# Patient Record
Sex: Female | Born: 1935 | Race: White | Hispanic: No | Smoking: Never smoker
Health system: Southern US, Community
[De-identification: ages and names within clinical notes are randomized; demographics above are authoritative.]

## PROBLEM LIST (undated history)

## (undated) DIAGNOSIS — K219 Gastro-esophageal reflux disease without esophagitis: Secondary | ICD-10-CM

## (undated) DIAGNOSIS — I10 Essential (primary) hypertension: Secondary | ICD-10-CM

## (undated) DIAGNOSIS — M359 Systemic involvement of connective tissue, unspecified: Secondary | ICD-10-CM

## (undated) DIAGNOSIS — I2510 Atherosclerotic heart disease of native coronary artery without angina pectoris: Secondary | ICD-10-CM

## (undated) DIAGNOSIS — C189 Malignant neoplasm of colon, unspecified: Secondary | ICD-10-CM

## (undated) DIAGNOSIS — C787 Secondary malignant neoplasm of liver and intrahepatic bile duct: Principal | ICD-10-CM

## (undated) DIAGNOSIS — I779 Disorder of arteries and arterioles, unspecified: Secondary | ICD-10-CM

## (undated) DIAGNOSIS — D649 Anemia, unspecified: Secondary | ICD-10-CM

## (undated) DIAGNOSIS — E785 Hyperlipidemia, unspecified: Secondary | ICD-10-CM

## (undated) DIAGNOSIS — Z95810 Presence of automatic (implantable) cardiac defibrillator: Secondary | ICD-10-CM

## (undated) DIAGNOSIS — K828 Other specified diseases of gallbladder: Secondary | ICD-10-CM

## (undated) DIAGNOSIS — I639 Cerebral infarction, unspecified: Secondary | ICD-10-CM

## (undated) DIAGNOSIS — I509 Heart failure, unspecified: Secondary | ICD-10-CM

## (undated) DIAGNOSIS — K2270 Barrett's esophagus without dysplasia: Secondary | ICD-10-CM

## (undated) DIAGNOSIS — I739 Peripheral vascular disease, unspecified: Secondary | ICD-10-CM

## (undated) DIAGNOSIS — K5792 Diverticulitis of intestine, part unspecified, without perforation or abscess without bleeding: Secondary | ICD-10-CM

## (undated) HISTORY — DX: Malignant neoplasm of colon, unspecified: C18.9

## (undated) HISTORY — PX: CARDIAC DEFIBRILLATOR PLACEMENT: SHX171

## (undated) HISTORY — DX: Heart failure, unspecified: I50.9

## (undated) HISTORY — DX: Hyperlipidemia, unspecified: E78.5

## (undated) HISTORY — DX: Diverticulitis of intestine, part unspecified, without perforation or abscess without bleeding: K57.92

## (undated) HISTORY — DX: Essential (primary) hypertension: I10

## (undated) HISTORY — DX: Other specified diseases of gallbladder: K82.8

## (undated) HISTORY — PX: ARTERIAL BYPASS SURGRY: SHX557

## (undated) HISTORY — DX: Secondary malignant neoplasm of liver and intrahepatic bile duct: C78.7

## (undated) HISTORY — DX: Atherosclerotic heart disease of native coronary artery without angina pectoris: I25.10

## (undated) HISTORY — DX: Peripheral vascular disease, unspecified: I73.9

## (undated) HISTORY — DX: Disorder of arteries and arterioles, unspecified: I77.9

## (undated) HISTORY — DX: Barrett's esophagus without dysplasia: K22.70

---

## 1997-06-06 ENCOUNTER — Encounter: Payer: Self-pay | Admitting: Internal Medicine

## 1997-06-26 ENCOUNTER — Inpatient Hospital Stay (HOSPITAL_COMMUNITY): Admission: AD | Admit: 1997-06-26 | Discharge: 1997-06-28 | Payer: Self-pay | Admitting: Cardiology

## 1997-07-04 ENCOUNTER — Inpatient Hospital Stay (HOSPITAL_COMMUNITY): Admission: EM | Admit: 1997-07-04 | Discharge: 1997-07-08 | Payer: Self-pay | Admitting: Emergency Medicine

## 1998-10-13 ENCOUNTER — Encounter: Payer: Self-pay | Admitting: *Deleted

## 1998-10-13 ENCOUNTER — Emergency Department (HOSPITAL_COMMUNITY): Admission: EM | Admit: 1998-10-13 | Discharge: 1998-10-13 | Payer: Self-pay | Admitting: Emergency Medicine

## 2000-08-07 ENCOUNTER — Inpatient Hospital Stay (HOSPITAL_COMMUNITY): Admission: EM | Admit: 2000-08-07 | Discharge: 2000-08-18 | Payer: Self-pay | Admitting: Emergency Medicine

## 2000-08-07 ENCOUNTER — Encounter: Payer: Self-pay | Admitting: Emergency Medicine

## 2000-08-08 ENCOUNTER — Encounter: Payer: Self-pay | Admitting: Cardiothoracic Surgery

## 2000-08-08 ENCOUNTER — Encounter: Payer: Self-pay | Admitting: *Deleted

## 2000-08-09 ENCOUNTER — Encounter: Payer: Self-pay | Admitting: Cardiothoracic Surgery

## 2000-08-10 ENCOUNTER — Encounter: Payer: Self-pay | Admitting: Cardiothoracic Surgery

## 2000-08-11 ENCOUNTER — Encounter: Payer: Self-pay | Admitting: Cardiothoracic Surgery

## 2000-08-12 ENCOUNTER — Encounter: Payer: Self-pay | Admitting: Cardiothoracic Surgery

## 2000-08-13 ENCOUNTER — Encounter: Payer: Self-pay | Admitting: Thoracic Surgery (Cardiothoracic Vascular Surgery)

## 2000-08-31 ENCOUNTER — Inpatient Hospital Stay (HOSPITAL_COMMUNITY): Admission: EM | Admit: 2000-08-31 | Discharge: 2000-09-04 | Payer: Self-pay | Admitting: Emergency Medicine

## 2000-08-31 ENCOUNTER — Encounter: Payer: Self-pay | Admitting: Cardiology

## 2000-09-20 ENCOUNTER — Encounter: Admission: RE | Admit: 2000-09-20 | Discharge: 2000-12-19 | Payer: Self-pay | Admitting: *Deleted

## 2004-01-13 ENCOUNTER — Ambulatory Visit: Payer: Self-pay

## 2004-04-19 ENCOUNTER — Ambulatory Visit: Payer: Self-pay | Admitting: Cardiology

## 2006-09-20 ENCOUNTER — Ambulatory Visit: Payer: Self-pay | Admitting: Cardiology

## 2006-09-20 LAB — CONVERTED CEMR LAB
Basophils Absolute: 0.1 10*3/uL (ref 0.0–0.1)
Creatinine, Ser: 0.7 mg/dL (ref 0.4–1.2)
HCT: 40.1 % (ref 36.0–46.0)
Hemoglobin: 13.7 g/dL (ref 12.0–15.0)
MCHC: 34.3 g/dL (ref 30.0–36.0)
MCV: 84.9 fL (ref 78.0–100.0)
Monocytes Absolute: 0.8 10*3/uL — ABNORMAL HIGH (ref 0.2–0.7)
Neutrophils Relative %: 67.4 % (ref 43.0–77.0)
Potassium: 3.7 meq/L (ref 3.5–5.1)
RDW: 13.7 % (ref 11.5–14.6)
Sodium: 135 meq/L (ref 135–145)
TSH: 2.46 microintl units/mL (ref 0.35–5.50)

## 2006-10-09 ENCOUNTER — Ambulatory Visit: Payer: Self-pay | Admitting: Cardiology

## 2006-10-17 ENCOUNTER — Ambulatory Visit: Payer: Self-pay | Admitting: Cardiology

## 2006-10-17 LAB — CONVERTED CEMR LAB
ALT: 19 units/L (ref 0–35)
AST: 26 units/L (ref 0–37)
Albumin: 3.7 g/dL (ref 3.5–5.2)
Alkaline Phosphatase: 108 units/L (ref 39–117)
Calcium: 9.5 mg/dL (ref 8.4–10.5)
Chloride: 103 meq/L (ref 96–112)
GFR calc non Af Amer: 75 mL/min
Total CHOL/HDL Ratio: 4.4
VLDL: 45 mg/dL — ABNORMAL HIGH (ref 0–40)

## 2006-10-27 ENCOUNTER — Ambulatory Visit: Payer: Self-pay | Admitting: Internal Medicine

## 2006-10-27 DIAGNOSIS — E785 Hyperlipidemia, unspecified: Secondary | ICD-10-CM

## 2006-10-27 DIAGNOSIS — E1142 Type 2 diabetes mellitus with diabetic polyneuropathy: Secondary | ICD-10-CM

## 2006-10-27 DIAGNOSIS — I10 Essential (primary) hypertension: Secondary | ICD-10-CM

## 2006-10-30 LAB — CONVERTED CEMR LAB
Basophils Absolute: 0.1 10*3/uL (ref 0.0–0.1)
Creatinine,U: 33.2 mg/dL
Hemoglobin: 13.2 g/dL (ref 12.0–15.0)
Lymphocytes Relative: 20.6 % (ref 12.0–46.0)
MCHC: 33.9 g/dL (ref 30.0–36.0)
Monocytes Absolute: 0.9 10*3/uL — ABNORMAL HIGH (ref 0.2–0.7)
Monocytes Relative: 6.2 % (ref 3.0–11.0)
Neutro Abs: 9.8 10*3/uL — ABNORMAL HIGH (ref 1.4–7.7)

## 2006-11-03 ENCOUNTER — Ambulatory Visit: Payer: Self-pay | Admitting: Cardiology

## 2006-11-03 LAB — CONVERTED CEMR LAB
Basophils Absolute: 0.1 10*3/uL (ref 0.0–0.1)
Creatinine, Ser: 1 mg/dL (ref 0.4–1.2)
Eosinophils Relative: 2.1 % (ref 0.0–5.0)
Glucose, Bld: 167 mg/dL — ABNORMAL HIGH (ref 70–99)
HCT: 39 % (ref 36.0–46.0)
Hemoglobin: 13.3 g/dL (ref 12.0–15.0)
INR: 0.9 (ref 0.8–1.0)
MCHC: 34.1 g/dL (ref 30.0–36.0)
MCV: 84.9 fL (ref 78.0–100.0)
Monocytes Absolute: 1 10*3/uL — ABNORMAL HIGH (ref 0.2–0.7)
Neutrophils Relative %: 69.8 % (ref 43.0–77.0)
Potassium: 3.9 meq/L (ref 3.5–5.1)
RDW: 13.6 % (ref 11.5–14.6)
Sodium: 139 meq/L (ref 135–145)
WBC: 14.7 10*3/uL — ABNORMAL HIGH (ref 4.5–10.5)
aPTT: 27.1 s (ref 21.7–29.8)

## 2006-11-07 ENCOUNTER — Ambulatory Visit: Payer: Self-pay | Admitting: Cardiology

## 2006-11-07 ENCOUNTER — Inpatient Hospital Stay (HOSPITAL_COMMUNITY): Admission: RE | Admit: 2006-11-07 | Discharge: 2006-11-10 | Payer: Self-pay | Admitting: Cardiology

## 2006-11-08 ENCOUNTER — Encounter: Payer: Self-pay | Admitting: Cardiology

## 2006-11-13 ENCOUNTER — Encounter: Payer: Self-pay | Admitting: Interventional Radiology

## 2006-11-20 ENCOUNTER — Ambulatory Visit: Payer: Self-pay | Admitting: Cardiology

## 2006-11-20 ENCOUNTER — Inpatient Hospital Stay (HOSPITAL_COMMUNITY): Admission: AD | Admit: 2006-11-20 | Discharge: 2006-11-21 | Payer: Self-pay | Admitting: Interventional Radiology

## 2006-12-11 ENCOUNTER — Encounter: Payer: Self-pay | Admitting: Interventional Radiology

## 2006-12-11 ENCOUNTER — Telehealth (INDEPENDENT_AMBULATORY_CARE_PROVIDER_SITE_OTHER): Payer: Self-pay | Admitting: *Deleted

## 2006-12-13 ENCOUNTER — Ambulatory Visit: Payer: Self-pay | Admitting: Cardiology

## 2006-12-19 ENCOUNTER — Telehealth (INDEPENDENT_AMBULATORY_CARE_PROVIDER_SITE_OTHER): Payer: Self-pay | Admitting: *Deleted

## 2006-12-19 ENCOUNTER — Ambulatory Visit: Payer: Self-pay | Admitting: Cardiology

## 2006-12-19 LAB — CONVERTED CEMR LAB
ALT: 16 units/L (ref 0–35)
Albumin: 3.8 g/dL (ref 3.5–5.2)
Alkaline Phosphatase: 114 units/L (ref 39–117)
BUN: 13 mg/dL (ref 6–23)
CO2: 30 meq/L (ref 19–32)
Calcium: 9.2 mg/dL (ref 8.4–10.5)
GFR calc Af Amer: 79 mL/min
GFR calc non Af Amer: 66 mL/min
LDL Cholesterol: 73 mg/dL (ref 0–99)
Potassium: 3.7 meq/L (ref 3.5–5.1)
Total CHOL/HDL Ratio: 3.8
Triglycerides: 153 mg/dL — ABNORMAL HIGH (ref 0–149)
VLDL: 31 mg/dL (ref 0–40)

## 2006-12-21 ENCOUNTER — Ambulatory Visit: Payer: Self-pay | Admitting: Internal Medicine

## 2007-01-11 ENCOUNTER — Ambulatory Visit: Payer: Self-pay | Admitting: Cardiology

## 2007-01-22 DIAGNOSIS — K828 Other specified diseases of gallbladder: Secondary | ICD-10-CM

## 2007-01-22 DIAGNOSIS — K5792 Diverticulitis of intestine, part unspecified, without perforation or abscess without bleeding: Secondary | ICD-10-CM

## 2007-01-22 HISTORY — DX: Diverticulitis of intestine, part unspecified, without perforation or abscess without bleeding: K57.92

## 2007-01-22 HISTORY — DX: Other specified diseases of gallbladder: K82.8

## 2007-01-30 ENCOUNTER — Ambulatory Visit: Payer: Self-pay | Admitting: Internal Medicine

## 2007-01-30 LAB — CONVERTED CEMR LAB
BUN: 15 mg/dL (ref 6–23)
Basophils Relative: 0.9 % (ref 0.0–1.0)
CO2: 30 meq/L (ref 19–32)
Calcium: 9.5 mg/dL (ref 8.4–10.5)
Creatinine, Ser: 1 mg/dL (ref 0.4–1.2)
Eosinophils Relative: 3.2 % (ref 0.0–5.0)
GFR calc Af Amer: 70 mL/min
Glucose, Bld: 172 mg/dL — ABNORMAL HIGH (ref 70–99)
Hemoglobin: 11.7 g/dL — ABNORMAL LOW (ref 12.0–15.0)
INR: 0.9 (ref 0.8–1.0)
Lymphocytes Relative: 23.2 % (ref 12.0–46.0)
Monocytes Absolute: 0.6 10*3/uL (ref 0.2–0.7)
Monocytes Relative: 5.9 % (ref 3.0–11.0)
Neutro Abs: 7.3 10*3/uL (ref 1.4–7.7)
Potassium: 3.6 meq/L (ref 3.5–5.1)
Prothrombin Time: 11.4 s (ref 10.9–13.3)
RDW: 13.5 % (ref 11.5–14.6)
WBC: 10.8 10*3/uL — ABNORMAL HIGH (ref 4.5–10.5)

## 2007-02-01 ENCOUNTER — Ambulatory Visit: Payer: Self-pay | Admitting: Internal Medicine

## 2007-02-01 ENCOUNTER — Observation Stay (HOSPITAL_COMMUNITY): Admission: RE | Admit: 2007-02-01 | Discharge: 2007-02-02 | Payer: Self-pay | Admitting: Internal Medicine

## 2007-02-12 ENCOUNTER — Ambulatory Visit: Payer: Self-pay | Admitting: Internal Medicine

## 2007-02-12 ENCOUNTER — Telehealth (INDEPENDENT_AMBULATORY_CARE_PROVIDER_SITE_OTHER): Payer: Self-pay | Admitting: *Deleted

## 2007-02-12 DIAGNOSIS — I779 Disorder of arteries and arterioles, unspecified: Secondary | ICD-10-CM | POA: Insufficient documentation

## 2007-02-12 DIAGNOSIS — I739 Peripheral vascular disease, unspecified: Secondary | ICD-10-CM

## 2007-02-20 ENCOUNTER — Telehealth: Payer: Self-pay | Admitting: Internal Medicine

## 2007-02-20 ENCOUNTER — Ambulatory Visit: Payer: Self-pay | Admitting: Internal Medicine

## 2007-02-20 ENCOUNTER — Encounter (INDEPENDENT_AMBULATORY_CARE_PROVIDER_SITE_OTHER): Payer: Self-pay | Admitting: *Deleted

## 2007-02-20 ENCOUNTER — Inpatient Hospital Stay (HOSPITAL_COMMUNITY): Admission: EM | Admit: 2007-02-20 | Discharge: 2007-02-23 | Payer: Self-pay | Admitting: Emergency Medicine

## 2007-02-23 ENCOUNTER — Encounter (INDEPENDENT_AMBULATORY_CARE_PROVIDER_SITE_OTHER): Payer: Self-pay | Admitting: *Deleted

## 2007-03-01 ENCOUNTER — Ambulatory Visit: Payer: Self-pay | Admitting: Cardiology

## 2007-03-01 LAB — CONVERTED CEMR LAB
Basophils Absolute: 0 10*3/uL (ref 0.0–0.1)
Eosinophils Absolute: 0.5 10*3/uL (ref 0.0–0.6)
Eosinophils Relative: 3.5 % (ref 0.0–5.0)
GFR calc Af Amer: 70 mL/min
GFR calc non Af Amer: 58 mL/min
Glucose, Bld: 142 mg/dL — ABNORMAL HIGH (ref 70–99)
HCT: 35.5 % — ABNORMAL LOW (ref 36.0–46.0)
Lymphocytes Relative: 16 % (ref 12.0–46.0)
MCHC: 34.5 g/dL (ref 30.0–36.0)
MCV: 81 fL (ref 78.0–100.0)
Neutro Abs: 11 10*3/uL — ABNORMAL HIGH (ref 1.4–7.7)
Neutrophils Relative %: 75.6 % (ref 43.0–77.0)
Potassium: 3.7 meq/L (ref 3.5–5.1)
Sodium: 137 meq/L (ref 135–145)
WBC: 14.5 10*3/uL — ABNORMAL HIGH (ref 4.5–10.5)

## 2007-03-05 ENCOUNTER — Ambulatory Visit: Payer: Self-pay

## 2007-03-09 ENCOUNTER — Ambulatory Visit: Payer: Self-pay | Admitting: Internal Medicine

## 2007-03-09 DIAGNOSIS — K8020 Calculus of gallbladder without cholecystitis without obstruction: Secondary | ICD-10-CM | POA: Insufficient documentation

## 2007-03-16 ENCOUNTER — Ambulatory Visit (HOSPITAL_COMMUNITY): Admission: RE | Admit: 2007-03-16 | Discharge: 2007-03-16 | Payer: Self-pay | Admitting: Interventional Radiology

## 2007-03-19 ENCOUNTER — Inpatient Hospital Stay (HOSPITAL_COMMUNITY): Admission: EM | Admit: 2007-03-19 | Discharge: 2007-03-24 | Payer: Self-pay | Admitting: Emergency Medicine

## 2007-03-19 ENCOUNTER — Ambulatory Visit: Payer: Self-pay | Admitting: Internal Medicine

## 2007-03-24 ENCOUNTER — Encounter: Payer: Self-pay | Admitting: Internal Medicine

## 2007-04-03 ENCOUNTER — Inpatient Hospital Stay (HOSPITAL_COMMUNITY): Admission: RE | Admit: 2007-04-03 | Discharge: 2007-04-04 | Payer: Self-pay | Admitting: Interventional Radiology

## 2007-04-09 ENCOUNTER — Ambulatory Visit: Payer: Self-pay | Admitting: Cardiology

## 2007-04-09 LAB — CONVERTED CEMR LAB
BUN: 12 mg/dL (ref 6–23)
GFR calc Af Amer: 106 mL/min
GFR calc non Af Amer: 87 mL/min
Potassium: 3.4 meq/L — ABNORMAL LOW (ref 3.5–5.1)
Sodium: 138 meq/L (ref 135–145)

## 2007-04-18 ENCOUNTER — Ambulatory Visit: Payer: Self-pay | Admitting: Cardiology

## 2007-04-18 LAB — CONVERTED CEMR LAB
BUN: 11 mg/dL (ref 6–23)
CO2: 30 meq/L (ref 19–32)
Calcium: 9.3 mg/dL (ref 8.4–10.5)
GFR calc Af Amer: 70 mL/min
GFR calc non Af Amer: 58 mL/min
Potassium: 4.6 meq/L (ref 3.5–5.1)

## 2007-04-19 ENCOUNTER — Encounter: Payer: Self-pay | Admitting: Interventional Radiology

## 2007-05-09 ENCOUNTER — Ambulatory Visit: Payer: Self-pay | Admitting: Cardiology

## 2007-05-09 ENCOUNTER — Ambulatory Visit: Payer: Self-pay | Admitting: Internal Medicine

## 2007-05-09 LAB — CONVERTED CEMR LAB
BUN: 14 mg/dL (ref 6–23)
Calcium: 9.6 mg/dL (ref 8.4–10.5)
GFR calc Af Amer: 79 mL/min
GFR calc non Af Amer: 65 mL/min
Glucose, Bld: 156 mg/dL — ABNORMAL HIGH (ref 70–99)

## 2007-06-07 ENCOUNTER — Ambulatory Visit: Payer: Self-pay | Admitting: Internal Medicine

## 2007-06-07 DIAGNOSIS — R269 Unspecified abnormalities of gait and mobility: Secondary | ICD-10-CM

## 2007-06-12 ENCOUNTER — Ambulatory Visit: Payer: Self-pay | Admitting: Cardiology

## 2007-07-09 ENCOUNTER — Telehealth (INDEPENDENT_AMBULATORY_CARE_PROVIDER_SITE_OTHER): Payer: Self-pay | Admitting: *Deleted

## 2007-07-10 ENCOUNTER — Ambulatory Visit: Payer: Self-pay | Admitting: Internal Medicine

## 2007-07-17 ENCOUNTER — Encounter (INDEPENDENT_AMBULATORY_CARE_PROVIDER_SITE_OTHER): Payer: Self-pay | Admitting: *Deleted

## 2007-07-18 ENCOUNTER — Ambulatory Visit: Payer: Self-pay | Admitting: Internal Medicine

## 2007-07-20 ENCOUNTER — Telehealth (INDEPENDENT_AMBULATORY_CARE_PROVIDER_SITE_OTHER): Payer: Self-pay | Admitting: *Deleted

## 2007-07-20 LAB — CONVERTED CEMR LAB
ALT: 14 units/L (ref 0–35)
AST: 18 units/L (ref 0–37)
Cholesterol: 142 mg/dL (ref 0–200)
Creatinine,U: 108.5 mg/dL
HDL: 29.6 mg/dL — ABNORMAL LOW (ref >39.0)
Hgb A1c MFr Bld: 7 % — ABNORMAL HIGH (ref 4.6–6.0)
Microalb, Ur: 2.7 mg/dL — ABNORMAL HIGH (ref 0.0–1.9)

## 2007-08-02 ENCOUNTER — Ambulatory Visit (HOSPITAL_COMMUNITY): Admission: RE | Admit: 2007-08-02 | Discharge: 2007-08-02 | Payer: Self-pay | Admitting: Interventional Radiology

## 2007-08-03 ENCOUNTER — Encounter: Payer: Self-pay | Admitting: Interventional Radiology

## 2007-08-06 ENCOUNTER — Ambulatory Visit: Payer: Self-pay | Admitting: Internal Medicine

## 2007-11-05 ENCOUNTER — Ambulatory Visit: Payer: Self-pay | Admitting: Internal Medicine

## 2007-11-05 DIAGNOSIS — G4700 Insomnia, unspecified: Secondary | ICD-10-CM

## 2007-11-12 ENCOUNTER — Encounter (INDEPENDENT_AMBULATORY_CARE_PROVIDER_SITE_OTHER): Payer: Self-pay | Admitting: *Deleted

## 2007-11-12 LAB — CONVERTED CEMR LAB
BUN: 20 mg/dL (ref 6–23)
Calcium: 9.2 mg/dL (ref 8.4–10.5)
GFR calc Af Amer: 70 mL/min
Glucose, Bld: 112 mg/dL — ABNORMAL HIGH (ref 70–99)
Sodium: 144 meq/L (ref 135–145)

## 2007-12-27 ENCOUNTER — Ambulatory Visit (HOSPITAL_COMMUNITY): Admission: RE | Admit: 2007-12-27 | Discharge: 2007-12-27 | Payer: Self-pay | Admitting: Interventional Radiology

## 2007-12-31 ENCOUNTER — Ambulatory Visit: Payer: Self-pay | Admitting: Internal Medicine

## 2008-01-24 ENCOUNTER — Ambulatory Visit: Payer: Self-pay | Admitting: Cardiology

## 2008-01-24 ENCOUNTER — Encounter: Payer: Self-pay | Admitting: Internal Medicine

## 2008-01-29 ENCOUNTER — Inpatient Hospital Stay (HOSPITAL_COMMUNITY): Admission: RE | Admit: 2008-01-29 | Discharge: 2008-01-31 | Payer: Self-pay | Admitting: Interventional Radiology

## 2008-01-29 ENCOUNTER — Ambulatory Visit: Payer: Self-pay | Admitting: Internal Medicine

## 2008-02-01 ENCOUNTER — Telehealth (INDEPENDENT_AMBULATORY_CARE_PROVIDER_SITE_OTHER): Payer: Self-pay | Admitting: *Deleted

## 2008-02-12 ENCOUNTER — Encounter: Payer: Self-pay | Admitting: Internal Medicine

## 2008-02-12 ENCOUNTER — Encounter: Payer: Self-pay | Admitting: Interventional Radiology

## 2008-02-18 ENCOUNTER — Telehealth (INDEPENDENT_AMBULATORY_CARE_PROVIDER_SITE_OTHER): Payer: Self-pay | Admitting: *Deleted

## 2008-02-26 ENCOUNTER — Ambulatory Visit: Payer: Self-pay | Admitting: Internal Medicine

## 2008-02-27 ENCOUNTER — Ambulatory Visit: Payer: Self-pay | Admitting: Internal Medicine

## 2008-03-02 ENCOUNTER — Telehealth: Payer: Self-pay | Admitting: Family Medicine

## 2008-04-10 ENCOUNTER — Encounter: Payer: Self-pay | Admitting: Internal Medicine

## 2008-05-13 ENCOUNTER — Telehealth (INDEPENDENT_AMBULATORY_CARE_PROVIDER_SITE_OTHER): Payer: Self-pay | Admitting: *Deleted

## 2008-05-20 ENCOUNTER — Ambulatory Visit: Payer: Self-pay | Admitting: Internal Medicine

## 2008-05-20 LAB — HM DIABETES FOOT EXAM

## 2008-05-26 ENCOUNTER — Ambulatory Visit: Payer: Self-pay | Admitting: Internal Medicine

## 2008-05-27 ENCOUNTER — Ambulatory Visit: Payer: Self-pay | Admitting: Internal Medicine

## 2008-05-29 ENCOUNTER — Telehealth: Payer: Self-pay | Admitting: Internal Medicine

## 2008-06-03 ENCOUNTER — Telehealth (INDEPENDENT_AMBULATORY_CARE_PROVIDER_SITE_OTHER): Payer: Self-pay | Admitting: *Deleted

## 2008-06-03 LAB — CONVERTED CEMR LAB
Basophils Absolute: 0.1 10*3/uL (ref 0.0–0.1)
CO2: 26 meq/L (ref 19–32)
Calcium: 9.2 mg/dL (ref 8.4–10.5)
Creatinine, Ser: 1 mg/dL (ref 0.4–1.2)
Creatinine,U: 38.4 mg/dL
HDL: 31.7 mg/dL — ABNORMAL LOW (ref >39.00)
Hgb A1c MFr Bld: 7.3 % — ABNORMAL HIGH (ref 4.6–6.5)
Lymphocytes Relative: 18.8 % (ref 12.0–46.0)
Microalb Creat Ratio: 20.8 mg/g (ref 0.0–30.0)
Monocytes Relative: 7.2 % (ref 3.0–12.0)
Neutrophils Relative %: 69.7 % (ref 43.0–77.0)
Platelets: 216 10*3/uL (ref 150.0–400.0)
RDW: 14 % (ref 11.5–14.6)
TSH: 2.81 microintl units/mL (ref 0.35–5.50)
Total CHOL/HDL Ratio: 4
VLDL: 30.2 mg/dL (ref 0.0–40.0)

## 2008-06-05 ENCOUNTER — Ambulatory Visit: Payer: Self-pay | Admitting: Internal Medicine

## 2008-06-06 ENCOUNTER — Telehealth: Payer: Self-pay | Admitting: Internal Medicine

## 2008-06-10 ENCOUNTER — Ambulatory Visit: Payer: Self-pay | Admitting: Internal Medicine

## 2008-06-12 ENCOUNTER — Telehealth (INDEPENDENT_AMBULATORY_CARE_PROVIDER_SITE_OTHER): Payer: Self-pay | Admitting: *Deleted

## 2008-06-12 LAB — CONVERTED CEMR LAB
Ferritin: 31.7 ng/mL (ref 10.0–291.0)
Iron: 40 ug/dL — ABNORMAL LOW (ref 42–145)

## 2008-07-18 DIAGNOSIS — Z862 Personal history of diseases of the blood and blood-forming organs and certain disorders involving the immune mechanism: Secondary | ICD-10-CM

## 2008-07-18 DIAGNOSIS — D649 Anemia, unspecified: Secondary | ICD-10-CM | POA: Insufficient documentation

## 2008-07-18 DIAGNOSIS — K2990 Gastroduodenitis, unspecified, without bleeding: Secondary | ICD-10-CM

## 2008-07-18 DIAGNOSIS — Z8639 Personal history of other endocrine, nutritional and metabolic disease: Secondary | ICD-10-CM

## 2008-07-18 DIAGNOSIS — K2970 Gastritis, unspecified, without bleeding: Secondary | ICD-10-CM | POA: Insufficient documentation

## 2008-07-18 DIAGNOSIS — K5730 Diverticulosis of large intestine without perforation or abscess without bleeding: Secondary | ICD-10-CM | POA: Insufficient documentation

## 2008-08-25 ENCOUNTER — Ambulatory Visit: Payer: Self-pay | Admitting: Internal Medicine

## 2008-09-04 ENCOUNTER — Encounter: Payer: Self-pay | Admitting: Internal Medicine

## 2008-10-21 ENCOUNTER — Telehealth (INDEPENDENT_AMBULATORY_CARE_PROVIDER_SITE_OTHER): Payer: Self-pay | Admitting: *Deleted

## 2008-11-23 ENCOUNTER — Encounter: Payer: Self-pay | Admitting: Internal Medicine

## 2008-11-24 ENCOUNTER — Ambulatory Visit: Payer: Self-pay | Admitting: Internal Medicine

## 2008-11-30 ENCOUNTER — Telehealth: Payer: Self-pay | Admitting: Internal Medicine

## 2008-12-05 ENCOUNTER — Encounter: Payer: Self-pay | Admitting: Internal Medicine

## 2008-12-12 ENCOUNTER — Encounter: Payer: Self-pay | Admitting: Internal Medicine

## 2008-12-19 ENCOUNTER — Encounter (INDEPENDENT_AMBULATORY_CARE_PROVIDER_SITE_OTHER): Payer: Self-pay | Admitting: *Deleted

## 2009-01-31 IMAGING — XA IR ANGIO/CAROTID/CERV BI
1 series · 15 of 24 positions shown · non-contrast
Comparison: CT angiogram of 03/23/2007, and catheter angiogram of
03/16/2007.

CLINICAL DATA: Right cerebral hemispheric ischemia secondary to
severe right internal carotid artery stenosis intracranially.
Status post endovascular stenting.

BILATERAL CAROTID ARTERIOGRAPHY, LEFT VERTEBRAL ARTERIOGRAPHY,
RIGHT VERTEBRAL ARTERIOGRAPHY, BILATERAL VERTEBRAL ARTERY
ANGIOGRAMS

[Series 1: run · 15 of 210 slices shown]
[im 1/210]
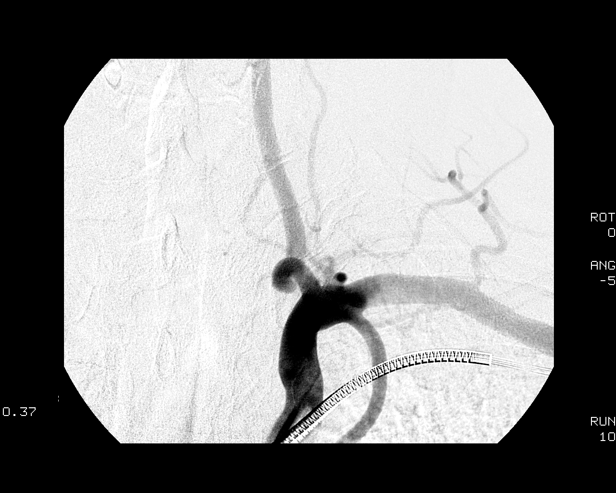
[im 19/210]
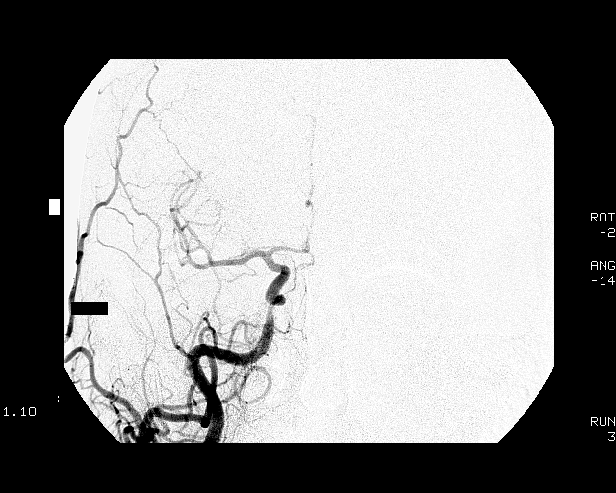
[im 37/210]
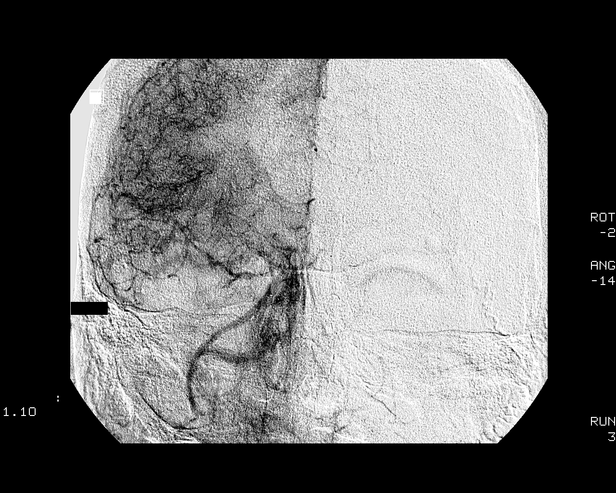
[im 46/210]
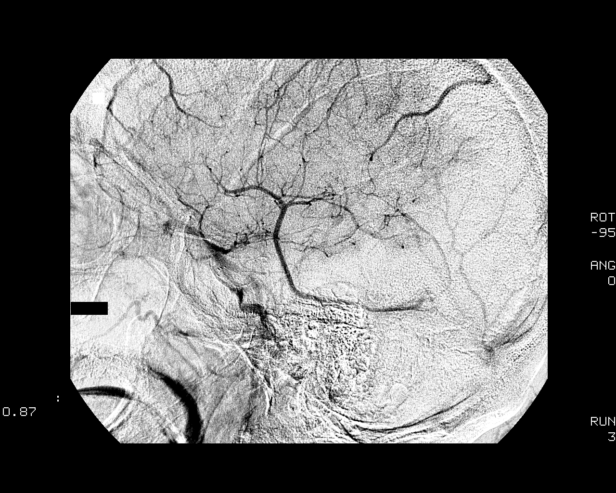
[im 64/210]
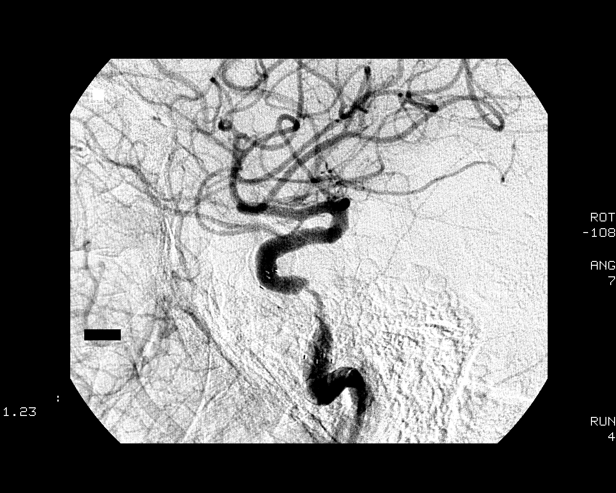
[im 73/210]
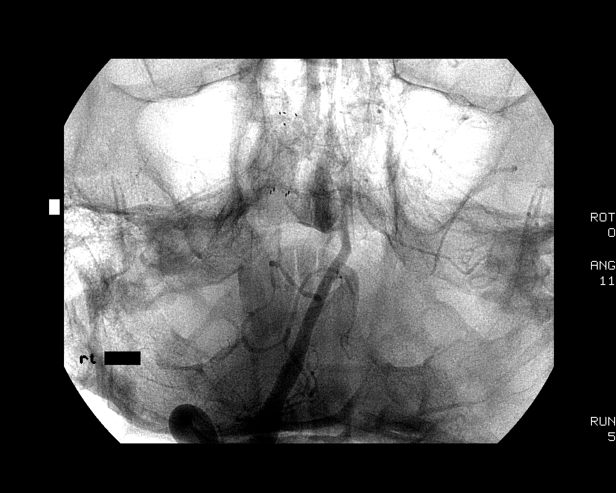
[im 91/210]
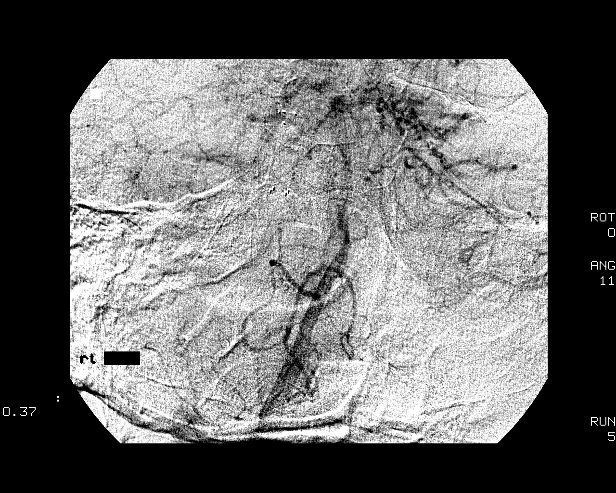
[im 110/210]
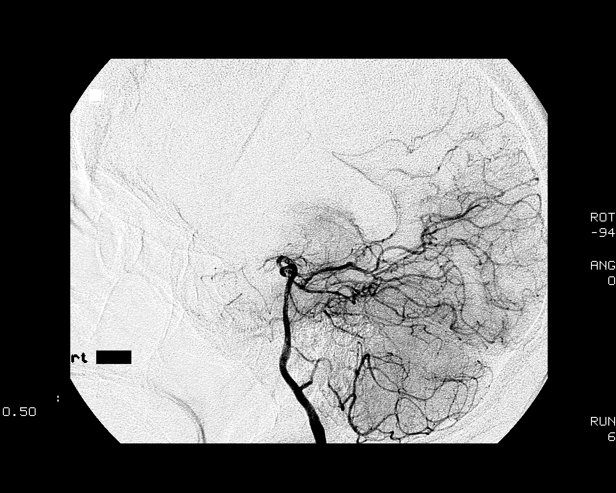
[im 119/210]
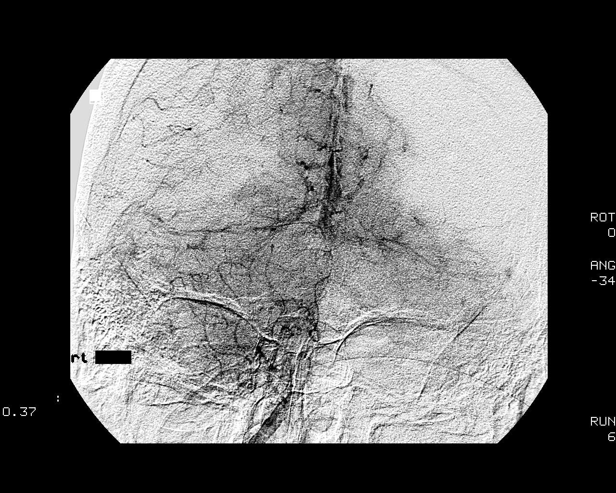
[im 137/210]
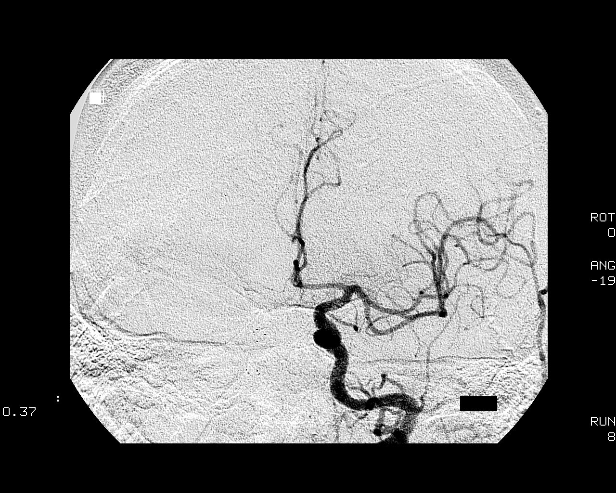
[im 146/210]
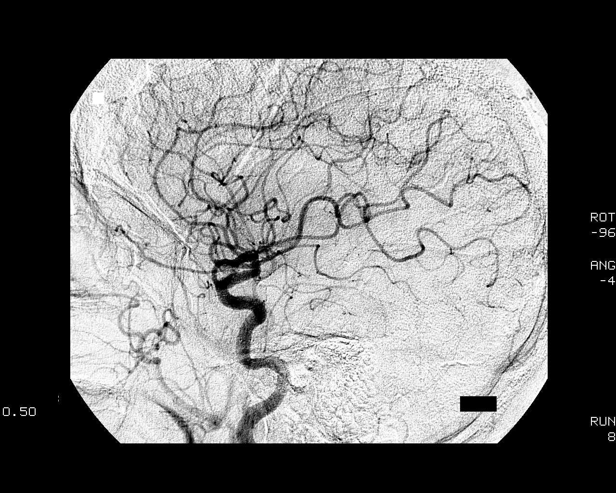
[im 164/210]
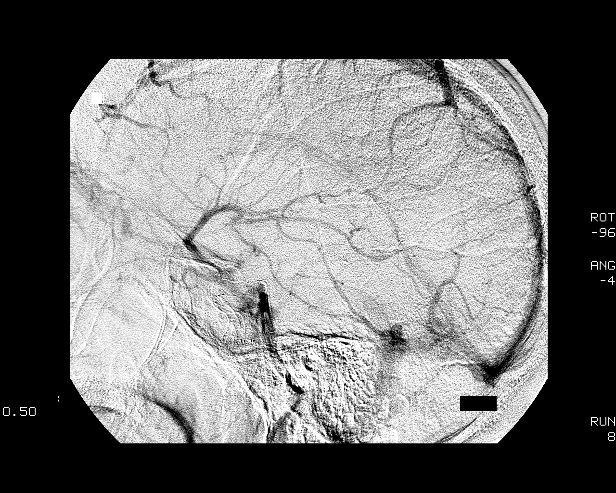
[im 182/210]
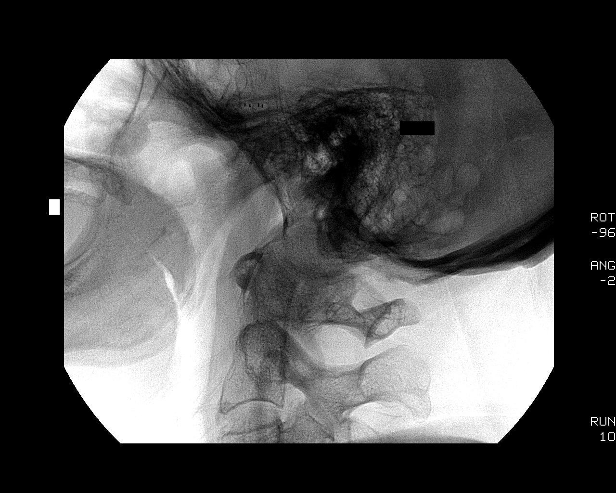
[im 191/210]
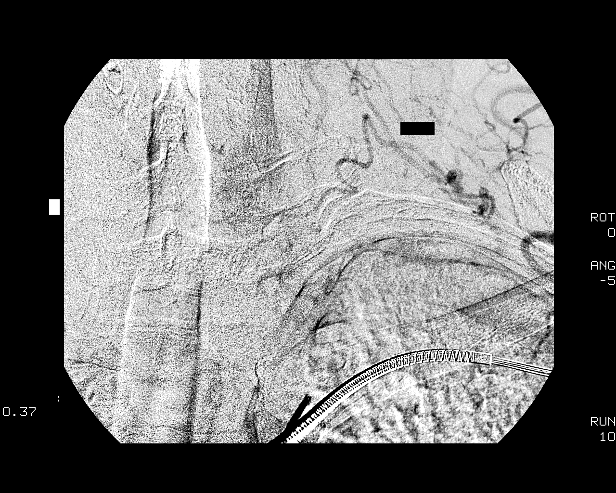
[im 210/210]
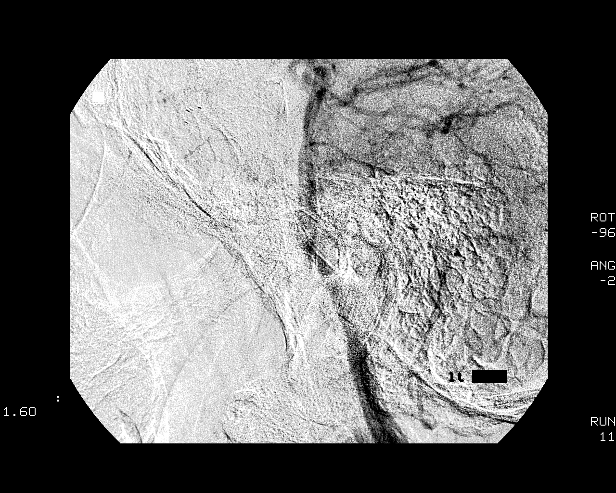

[15 of 24 positions shown; findings below may reference images not displayed]

Following a full explanation of the procedure along with the
potential associated complications, an informed witnessed consent
was obtained.

The right groin was prepped and draped in the usual sterile
fashion.  Thereafter using a modified Seldinger technique,
transfemoral access into the right common femoral artery was
obtained without difficulty.  Over a 0.035-inch guidewire, a 5-
French Pinnacle sheath was inserted.  Through this and also over a
0.035-inch guidewire, a 5-French JB1 catheter was advanced to the
aortic arch region and selectively positioned in the right common
carotid artery, the right vertebral artery, the left common carotid
artery and the left vertebral artery.

There were no acute complications.  The patient tolerated the
procedure well.

Medications utilized:  Versed 1 mg IV.  Fentanyl 25 mcg IV.
Heparin 500 units IV.
FINDINGS: The right common carotid arteriogram demonstrates the
right external carotid artery and its major branches to be normal.

The right internal carotid artery at the bulb has a smooth shallow
plaque along the posterior wall with less than 10% stenosis by the
NASCET criteria.  The vessel is otherwise seen to opacify normally
to the cranial skull base.

The petrous segment is normal.

There is a severe segmental stenosis within the mid portion of the
previously stented segment of the right internal carotid artery at
the petrous-cavernous junction.  The degree of stenosis is 90-95%.

The vessel distal to this is seen to opacify normally into the
distal cavernous and the supraclinoid segments.

The right middle and the right anterior cerebral arteries are seen
to opacify normally into the capillary and the venous phases.

The right vertebral artery origin is normal.  The vessel is seen to
opacify normally to the cranial skull base.

There is normal opacification of the right posterior inferior
cerebellar artery and the right vertebrobasilar junction.  The
basilar artery, the posterior cerebral arteries, superior
cerebellar arteries and the anterior inferior cerebellar are seen
to opacify normally into capillary and venous phases.  Unopacified
blood is seen in the basilar artery from the contralateral
vertebral artery.

There is retrograde opacification of the posterior parietal and the
posterior pericallosal branches from the P3 branches of the right
posterior cerebral artery.

The left common carotid arteriogram demonstrates the left external
carotid artery and its major branches to be normal.

The left internal carotid artery at the bulb has a smooth shallow
plaque along the posterior wall, again with less than 10% stenosis
by the NASCET criteria.

The vessel is otherwise seen to opacify normally to the cranial
skull base.  The petrous, the cavernous and the supraclinoid
segments are normal.  The left middle and the left anterior
cerebral arteries are seen to opacify normally into the capillary
and the venous phases.  Transient cross opacification via the
anterior communicating artery of the right anterior cerebral artery
distal to the A2 segment is noted.

The left vertebral artery origin is normal.  The vessel has mild
tortuosity proximally.  The vessel is seen to opacify normally to
the cranial skull base.  There is normal opacification of the left
posterior inferior cerebellar artery and the left vertebrobasilar
junction.  A mild to moderate 25-50% stenosis of the mid basilar
artery is again appreciated.

The right posterior cerebral artery, the superior cerebellar
arteries and the anterior inferior cerebral arteries are seen to
opacify normally into the capillary and the venous phases.  The
left posterior cerebral artery has a 50% stenosis in the P1
segment.

Retrograde opacification of the right vertebrobasilar junction is
noted from the left vertebral artery injection.
IMPRESSION: 1.  90-95% in-stent stenosis of the right internal carotid artery
at the petrous-cavernous junction.
2.  25-50% stenosis of the mid basilar artery.
3. Mild atherosclerotic disease involving both carotid bulbs.
4.  The above results were reviewed with the patient and the
patient's spouse and daughter.  The patient has remained
asymptomatic on aspirin and Plavix with good control of her
diabetes.  However, there has been significant progression of in-
stent stenosis to 95%.  Also angiographic evidence of
collateralization suggests insufficient blood supply secondary to
the right internal carotid artery in-stent stenosis.

Options discussed were those of continued medical management with a
repeat angiogram in approximately 3-4 months versus consideration
for endovascular re-treatment of the in-stent stenosis.  The
endovascular procedure will be tentatively scheduled in the first
week after Thanksgiving.

## 2009-02-25 ENCOUNTER — Ambulatory Visit: Payer: Self-pay | Admitting: Cardiology

## 2009-03-05 IMAGING — XA IR PTA INTRACRANIAL
1 series · 3 of 3 positions shown · non-contrast
Comparison: Angiogram of 12/27/2007.

CLINICAL DATA: Severe right internal carotid artery intracranial
atherosclerotic disease status post endovascular stent-assisted
angioplasty.  Progressive intrastent stenoses to 95% plus.

PTA INTRACRANIAL OF THE RIGHT INTERNAL CAROTID ARTERY CAVERNOUS
SEGMENT

[Series 1: run · 0.17mm/px · 3 of 3 slices shown]
[im 1/3]
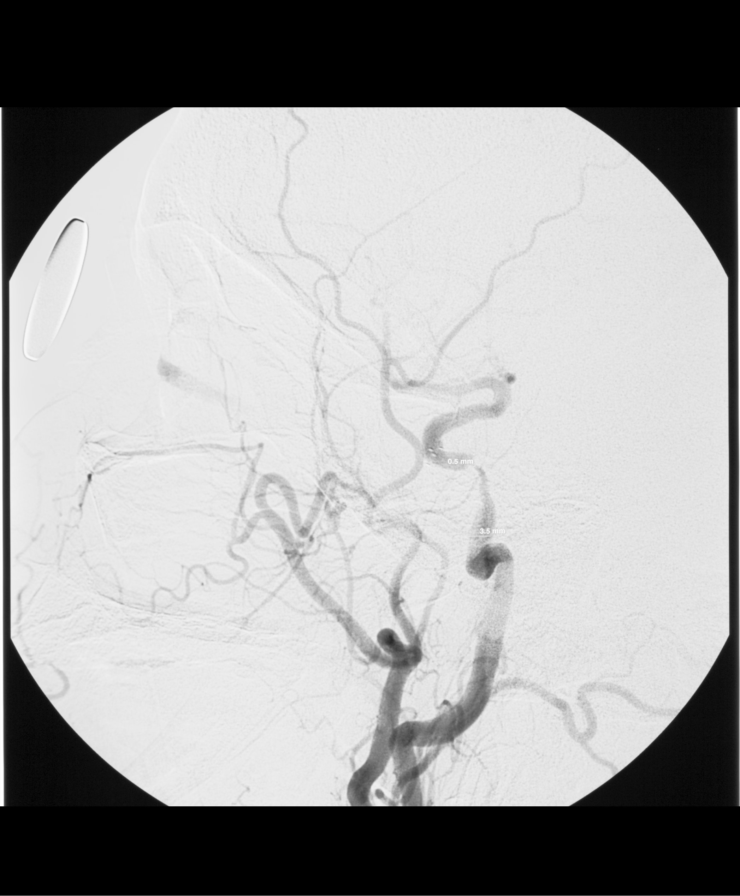
[im 2/3]
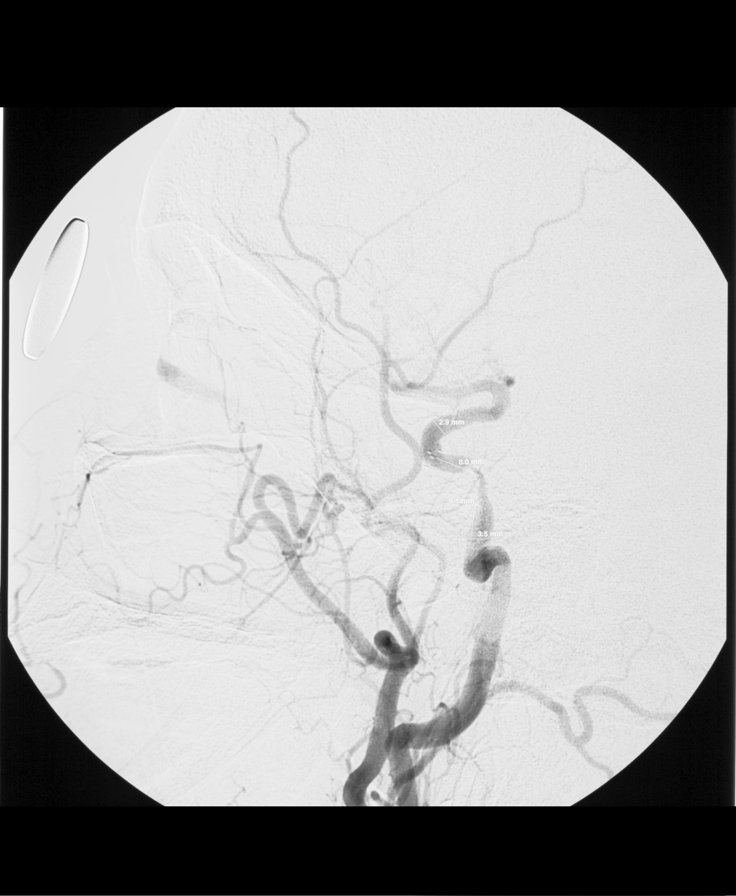
[im 3/3]
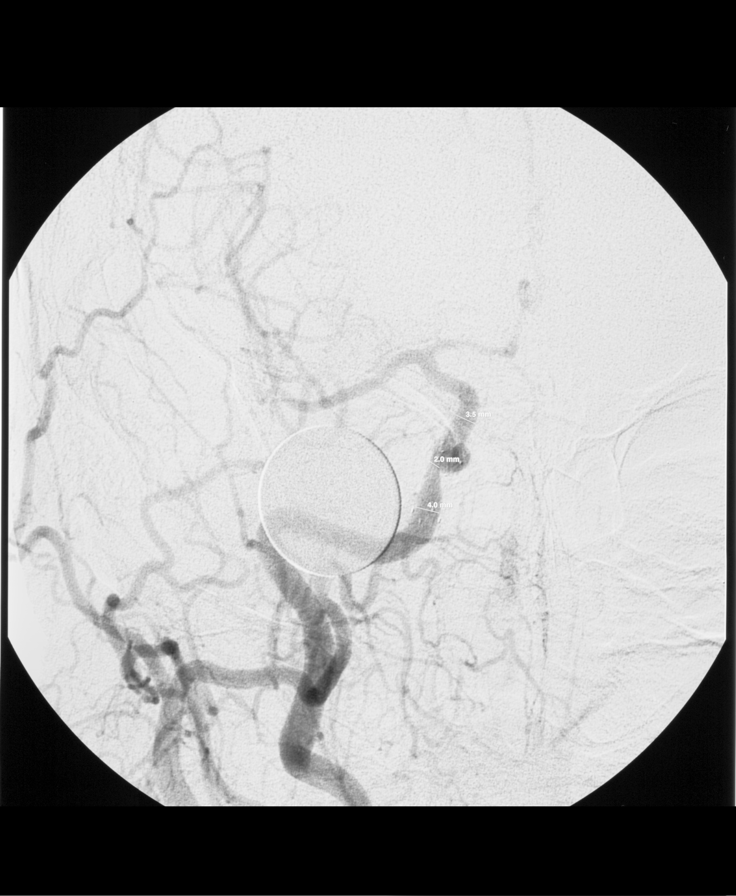

[3 of 3 positions shown; findings below may reference images not displayed]

Following a full explanation of the procedure along with the
potential associated complications, an informed witnessed consent
was obtained.

The right groin was prepped and draped in the usual sterile
fashion.  Thereafter using a modified Seldinger technique,
transfemoral access into the right common femoral artery was
obtained without difficulty.  Over a 0.035-inch guidewire, a 5-
French Pinnacle sheath was inserted.  Through this and also over a
0.035-inch guidewire, a 5-French JB-1 catheter was advanced to the
aortic arch region and selectively positioned in the right common
carotid artery.

An arteriogram was then perfor[REDACTED]ed over the carotid
bifurcation and intracranially.
FINDINGS: The right common carotid arteriogram demonstrates the
right external carotid artery and its major branches to be normally
opacified.

The right internal carotid artery at the bulb has a smooth shallow
plaque with a 10% stenosis by the NASCET criteria.  The vessel is
otherwise seen to opacify normally to the cranial skull base.

The petrous segment is seen to opacify normally.

There is a severe 95% plus tapered stenosis of the stented portion
of the right internal carotid artery in its proximal cavernous
segment.  The distal cavernous segment and the supraclinoid segment
are normal.

The right middle and the right anterior cerebral arteries are seen
to opacify normally into the capillary and venous phases with
delayed clearance of contrast from the peri-insular branches.  A
quarter was then positioned over the right eyeball and measurements
performed of the right internal carotid artery proximal and distal
to the previous stented portion.

These were 3.6 mm proximally, and 2.9 mm distally in the distal
cavernous segment.

The length of the severe stenosis was approximately 5 mm.

The above findings were reviewed with the patient and also the
patient's family.  Again discussed were the options of continued
medical management with increased risk right middle cerebral
distribution stroke versus endovascular treatment of the intrastent
stenosis with angioplasty and/or  placement of the neuro stent.
The latter option was chosen again as per the family and the
patient.

The patient was then put under general anesthesia by the [REDACTED] of [HOSPITAL].  The diagnostic 5-French
JB-1 catheter in the right common carotid artery was exchanged over
a 0.035-inch 300 cm Rosen exchange guidewire for a 6-French 65 cm
neurovascular sheath using biplane roadmap technique and constant
fluoroscopic guidance.

Good aspiration was obtained from the side port of the
neurovascular sheath.  A gentle contrast injection demonstrated no
evidence of spasm, dissections or of intraluminal filling defects.
This was then connected to continuous heparinized saline infusion.

Using biplane roadmap technique, and over the Rosen exchange
guidewire, a 6-French 90 cm straight Brite Tip Envoy guide catheter
was then advanced and positioned proximal to the right common
carotid bifurcation.  The guidewire was removed.  Good aspiration
was obtained from the hub of the 6-French guide catheter.  Again, a
gentle constant contrast injection demonstrated no evidence of
spasm, dissections or of intraluminal filling defects.

Over a 0.035-inch Roadrunner guidewire, the 6-French 90 cm Brite
Tip Envoy guide catheter was then advanced into the mid cervical
portion of the right internal carotid artery using biplane roadmap
technique.  The guidewire was removed.  Good aspiration was
obtained from the hub of the 6-French guide catheter.  Again a
gentle contrast injection demonstrated no areas of spasm,
dissections or of intracranial circulation.

At this time in a coaxial manner and with constant heparinized
saline infusion using roadmap technique and constant fluoroscopic
guidance, a Prowler 14LP two-tip microcatheter which had been the
patient steam-shaped was advanced over a 0.014-inch Transend Softip
EX microguidewire to the distal end of the guide catheter.  With
the microguidewire with a J configuration to avoid dissections or
inducing spasm, and using torque device control, the combination
was navigated to the distal petrous segment.  The microguidewire
was then manipulated with a torque device to advance through the
tight focal stenosis in the stented portion and subsequently into
the supraclinoid right ICA.  This was then followed by the
microcatheter without difficulty.  The microguidewire was then
advanced into the M1 region of the right middle cerebral artery and
advanced to the inferior division of the right middle cerebral
artery M2 segment proximally.

This was then followed by the microcatheter.  The microguidewire
was removed.  Good aspiration was obtained from the hub of the
microcatheter.  This was then retrieved over a 0.014-inch Softip
Transend EX 300 cm exchange microguidewire using biplane roadmap
technique and constant fluoroscopic guidance.  The tip of the
exchange microguidewire was positioned in the inferior division M2
segment of the right middle cerebral artery.  A control arteriogram
performed through the 6-French guide catheter demonstrated no
change in the intracranial circulation.

At this time in a coaxial manner and with constant heparinized
saline infusion using roadmap technique and constant fluoroscopic
guidance, a 4 mm x 15 mm Piotr Decastro
angioplasty balloon catheter was advanced over the exchange
microguidewire.  This balloon had been prepped and purged with
heparinized saline infusion and 75% contrast.  The balloon was
advanced without any difficulty and positioned such that the distal
and the proximal markers were equidistant from the focal area of
tight stenosis.

This balloon was then gradually dilated using a microinflation
syringe device via microtubing in slow increments to 3.93 mm.  Once
there, it was maintained for approximately a minute and a half.
The balloon was deflated.  A control arteriogram performed through
the 6 French guide catheter demonstrated significantly improved
caliber and flow through the intrastent angioplastied segment.
There was robust flow noted in the right middle cerebral artery and
the right anterior cerebral artery distributions.

The balloon was then inflated to 3.98 mm at an atmosphere pressure
of 5.8 atmospheres in slow increments as described above.  Once
there, the balloon was maintained at that pressure for
approximately 1.5 minutes.  Upon deflation a control arteriogram
was performed through the 6-French guide catheter.  Again a
significantly improved caliber and flow was noted through the
angioplastied segment and distally.  There continued to be a plaque
along the medial aspect inferiorly.  This prompted a third
inflation to 4 atmospheres where it was maintained for another
minute and a half as described above.  Upon deflation the balloon
was then retrieved proximally with the exchange microguidewire
maintained distally.  A control arteriogram performed through the 6-
French guide catheter demonstrated significantly improved caliber
and flow through the angioplastied segment of the stented portion
of the right internal carotid artery.  This continued to be the
case at 15, 30 and 40 minutes post angioplasty.  No acute changes
were noted in the patient's blood pressure or neurological status.
The patient's ACT was maintained in region of 250 to 300 seconds.

At the end of 40 minutes, the exchange microguidewire was gently
retrieved ensuring no entanglement with the stent struts.  None was
observed

A final control arteriogram performed through the 6-French guide
catheter demonstrated excellent flow through the angioplastied
segment of the stented portion of the right internal carotid artery
with improved flow noted in both the right MCA and the right
anterior cerebral artery distributions

The residual stenosis was estimated to be around 20% to 25% in view
of the recoil.

The patient's general anesthesia was reversed and the patient was
extubated after some time slowly.  Upon recovery the patient
demonstrated no evidence of new neurological deficits.  She did not
complain of any headaches, nausea, vomiting.

The patient was then transferred to the Neuro intensive care unit
for overnight treatment with IV heparin and close neurological
monitoring and that of the blood pressure within the desired
parameters.
IMPRESSION: 1. Status post endovascular intracranial intrastent angioplasty for
progressively severe intrastent stenosis as described.
2.  Residual stenosis of 20-25% secondary to recoil.

The patient's overnight stay was unremarkable.  Her IV heparin was
stopped the following morning.  She was started on aspirin 325 mg
and Plavix 75 mg.  The patient had an episode of coffee ground
emesis and a vague abdominal pain.  An internal medicine
consultation was obtained from the [REDACTED] service.
She was discharged on the second day to the care of her husband.  A
follow-up appointment for 2 weeks was made.

## 2009-03-16 ENCOUNTER — Telehealth (INDEPENDENT_AMBULATORY_CARE_PROVIDER_SITE_OTHER): Payer: Self-pay | Admitting: *Deleted

## 2009-03-25 ENCOUNTER — Encounter: Payer: Self-pay | Admitting: Cardiology

## 2009-03-25 ENCOUNTER — Ambulatory Visit: Payer: Self-pay | Admitting: Cardiology

## 2009-03-25 ENCOUNTER — Ambulatory Visit (HOSPITAL_COMMUNITY): Admission: RE | Admit: 2009-03-25 | Discharge: 2009-03-25 | Payer: Self-pay | Admitting: Cardiology

## 2009-03-25 ENCOUNTER — Ambulatory Visit: Payer: Self-pay

## 2009-03-25 ENCOUNTER — Ambulatory Visit: Payer: Self-pay | Admitting: Cardiovascular Disease

## 2009-04-08 LAB — CONVERTED CEMR LAB
AST: 15 units/L (ref 0–37)
Albumin: 3.7 g/dL (ref 3.5–5.2)
BUN: 19 mg/dL (ref 6–23)
Basophils Relative: 0.5 % (ref 0.0–3.0)
Calcium: 9.2 mg/dL (ref 8.4–10.5)
Cholesterol: 130 mg/dL (ref 0–200)
Creatinine, Ser: 1.1 mg/dL (ref 0.4–1.2)
Eosinophils Relative: 2.5 % (ref 0.0–5.0)
GFR calc non Af Amer: 51.56 mL/min (ref >60)
Glucose, Bld: 111 mg/dL — ABNORMAL HIGH (ref 70–99)
HCT: 32.4 % — ABNORMAL LOW (ref 36.0–46.0)
Hemoglobin: 10.9 g/dL — ABNORMAL LOW (ref 12.0–15.0)
Lymphs Abs: 2.5 10*3/uL (ref 0.7–4.0)
MCV: 85 fL (ref 78.0–100.0)
Monocytes Absolute: 0.8 10*3/uL (ref 0.1–1.0)
Neutro Abs: 6.6 10*3/uL (ref 1.4–7.7)
Platelets: 153 10*3/uL (ref 150.0–400.0)
RBC: 3.82 M/uL — ABNORMAL LOW (ref 3.87–5.11)
VLDL: 34.8 mg/dL (ref 0.0–40.0)
WBC: 10.3 10*3/uL (ref 4.5–10.5)

## 2009-04-14 ENCOUNTER — Ambulatory Visit: Payer: Self-pay | Admitting: Internal Medicine

## 2009-04-14 DIAGNOSIS — I5022 Chronic systolic (congestive) heart failure: Secondary | ICD-10-CM

## 2009-04-14 DIAGNOSIS — I2589 Other forms of chronic ischemic heart disease: Secondary | ICD-10-CM

## 2009-04-14 DIAGNOSIS — Z95810 Presence of automatic (implantable) cardiac defibrillator: Secondary | ICD-10-CM

## 2009-04-22 ENCOUNTER — Telehealth (INDEPENDENT_AMBULATORY_CARE_PROVIDER_SITE_OTHER): Payer: Self-pay | Admitting: *Deleted

## 2009-05-13 ENCOUNTER — Ambulatory Visit: Payer: Self-pay | Admitting: Internal Medicine

## 2009-05-14 ENCOUNTER — Encounter (INDEPENDENT_AMBULATORY_CARE_PROVIDER_SITE_OTHER): Payer: Self-pay | Admitting: *Deleted

## 2009-05-19 ENCOUNTER — Ambulatory Visit: Payer: Self-pay | Admitting: Internal Medicine

## 2009-05-22 ENCOUNTER — Ambulatory Visit: Payer: Self-pay | Admitting: Internal Medicine

## 2009-05-22 ENCOUNTER — Telehealth (INDEPENDENT_AMBULATORY_CARE_PROVIDER_SITE_OTHER): Payer: Self-pay | Admitting: *Deleted

## 2009-05-22 ENCOUNTER — Encounter (INDEPENDENT_AMBULATORY_CARE_PROVIDER_SITE_OTHER): Payer: Self-pay | Admitting: *Deleted

## 2009-05-28 LAB — CONVERTED CEMR LAB
Folate: 7 ng/mL
Iron: 37 ug/dL — ABNORMAL LOW (ref 42–145)
Vitamin B-12: 417 pg/mL (ref 211–911)

## 2009-06-08 ENCOUNTER — Ambulatory Visit: Payer: Self-pay | Admitting: Internal Medicine

## 2009-06-08 ENCOUNTER — Ambulatory Visit: Payer: Self-pay | Admitting: Cardiology

## 2009-06-09 ENCOUNTER — Telehealth (INDEPENDENT_AMBULATORY_CARE_PROVIDER_SITE_OTHER): Payer: Self-pay | Admitting: *Deleted

## 2009-06-10 DIAGNOSIS — R930 Abnormal findings on diagnostic imaging of skull and head, not elsewhere classified: Secondary | ICD-10-CM

## 2009-06-24 ENCOUNTER — Encounter: Payer: Self-pay | Admitting: Cardiology

## 2009-06-24 ENCOUNTER — Ambulatory Visit (HOSPITAL_COMMUNITY): Admission: RE | Admit: 2009-06-24 | Discharge: 2009-06-24 | Payer: Self-pay | Admitting: Interventional Radiology

## 2009-07-02 ENCOUNTER — Telehealth: Payer: Self-pay | Admitting: Internal Medicine

## 2009-07-13 ENCOUNTER — Inpatient Hospital Stay (HOSPITAL_COMMUNITY): Admission: RE | Admit: 2009-07-13 | Discharge: 2009-07-14 | Payer: Self-pay | Admitting: Interventional Radiology

## 2009-07-23 ENCOUNTER — Encounter (INDEPENDENT_AMBULATORY_CARE_PROVIDER_SITE_OTHER): Payer: Self-pay | Admitting: *Deleted

## 2009-08-20 ENCOUNTER — Ambulatory Visit: Payer: Self-pay | Admitting: Internal Medicine

## 2009-09-02 ENCOUNTER — Telehealth: Payer: Self-pay | Admitting: Internal Medicine

## 2009-09-04 ENCOUNTER — Ambulatory Visit: Payer: Self-pay | Admitting: Internal Medicine

## 2009-09-04 LAB — CONVERTED CEMR LAB
Basophils Relative: 0.2 % (ref 0.0–3.0)
Eosinophils Absolute: 0.2 10*3/uL (ref 0.0–0.7)
Eosinophils Relative: 2 % (ref 0.0–5.0)
Iron: 44 ug/dL (ref 42–145)
Lymphocytes Relative: 18.9 % (ref 12.0–46.0)
Neutrophils Relative %: 72.3 % (ref 43.0–77.0)
Platelets: 181 10*3/uL (ref 150.0–400.0)
RBC: 4.15 M/uL (ref 3.87–5.11)
Saturation Ratios: 13.3 % — ABNORMAL LOW (ref 20.0–50.0)
Transferrin: 236.4 mg/dL (ref 212.0–360.0)
WBC: 11.1 10*3/uL — ABNORMAL HIGH (ref 4.5–10.5)

## 2009-09-08 ENCOUNTER — Encounter: Payer: Self-pay | Admitting: Internal Medicine

## 2009-09-11 ENCOUNTER — Ambulatory Visit: Payer: Self-pay | Admitting: Internal Medicine

## 2009-09-11 LAB — CONVERTED CEMR LAB
Basophils Absolute: 0 10*3/uL (ref 0.0–0.1)
Basophils Relative: 0.4 % (ref 0.0–3.0)
Eosinophils Absolute: 0.2 10*3/uL (ref 0.0–0.7)
Hemoglobin: 11.3 g/dL — ABNORMAL LOW (ref 12.0–15.0)
Iron: 47 ug/dL (ref 42–145)
Lymphocytes Relative: 14.6 % (ref 12.0–46.0)
MCHC: 33.7 g/dL (ref 30.0–36.0)
MCV: 84 fL (ref 78.0–100.0)
Monocytes Absolute: 0.7 10*3/uL (ref 0.1–1.0)
Neutro Abs: 8.5 10*3/uL — ABNORMAL HIGH (ref 1.4–7.7)
RBC: 3.99 M/uL (ref 3.87–5.11)
RDW: 14.6 % (ref 11.5–14.6)
Transferrin: 213.2 mg/dL (ref 212.0–360.0)

## 2009-09-14 ENCOUNTER — Encounter: Payer: Self-pay | Admitting: Internal Medicine

## 2009-10-27 ENCOUNTER — Telehealth (INDEPENDENT_AMBULATORY_CARE_PROVIDER_SITE_OTHER): Payer: Self-pay | Admitting: *Deleted

## 2009-11-04 ENCOUNTER — Ambulatory Visit: Payer: Self-pay | Admitting: Internal Medicine

## 2009-11-05 ENCOUNTER — Ambulatory Visit: Payer: Self-pay | Admitting: Internal Medicine

## 2009-11-06 DIAGNOSIS — D509 Iron deficiency anemia, unspecified: Secondary | ICD-10-CM | POA: Insufficient documentation

## 2009-11-06 LAB — CONVERTED CEMR LAB
Basophils Absolute: 0 10*3/uL (ref 0.0–0.1)
Eosinophils Absolute: 0.2 10*3/uL (ref 0.0–0.7)
Hemoglobin: 11.1 g/dL — ABNORMAL LOW (ref 12.0–15.0)
Iron: 52 ug/dL (ref 42–145)
Lymphocytes Relative: 17 % (ref 12.0–46.0)
MCHC: 34.5 g/dL (ref 30.0–36.0)
Monocytes Relative: 6.3 % (ref 3.0–12.0)
Neutrophils Relative %: 74.2 % (ref 43.0–77.0)
Platelets: 179 10*3/uL (ref 150.0–400.0)
RDW: 14.8 % — ABNORMAL HIGH (ref 11.5–14.6)
Saturation Ratios: 16.1 % — ABNORMAL LOW (ref 20.0–50.0)
Transferrin: 230.3 mg/dL (ref 212.0–360.0)

## 2009-11-12 ENCOUNTER — Telehealth: Payer: Self-pay | Admitting: Internal Medicine

## 2009-12-22 ENCOUNTER — Encounter (HOSPITAL_COMMUNITY)
Admission: RE | Admit: 2009-12-22 | Discharge: 2010-02-19 | Payer: Self-pay | Source: Home / Self Care | Attending: Internal Medicine | Admitting: Internal Medicine

## 2009-12-22 ENCOUNTER — Telehealth (INDEPENDENT_AMBULATORY_CARE_PROVIDER_SITE_OTHER): Payer: Self-pay | Admitting: *Deleted

## 2009-12-22 ENCOUNTER — Encounter: Payer: Self-pay | Admitting: Internal Medicine

## 2010-01-11 ENCOUNTER — Telehealth (INDEPENDENT_AMBULATORY_CARE_PROVIDER_SITE_OTHER): Payer: Self-pay | Admitting: *Deleted

## 2010-01-11 ENCOUNTER — Telehealth: Payer: Self-pay | Admitting: Internal Medicine

## 2010-02-09 ENCOUNTER — Encounter (INDEPENDENT_AMBULATORY_CARE_PROVIDER_SITE_OTHER): Payer: Self-pay | Admitting: *Deleted

## 2010-03-14 ENCOUNTER — Encounter: Payer: Self-pay | Admitting: Interventional Radiology

## 2010-03-25 NOTE — Assessment & Plan Note (Signed)
Summary: [email or web address]    Visit Type:  Follow-up Primary Provider:  Nolon Rod. Paz MD   History of Present Illness: Mrs. [name] [name] returns today for followup.  She is a 77 yo woman with a h/o ICM, S/P MI and CHF.  She is s/p ICD implant secondary to the above and she denies c/p, sob, or peripheral edema.  The patient has had no intercurrent ICD therapies.  She has been saddened by the death of her husband.  Current Medications (verified): 1)  Metformin Hcl 500 Mg  Tabs (Metformin Hcl) .... Two Times A Day - No Additional Refills Without Office Visit 2)  Glimepiride 4 Mg  Tabs (Glimepiride) .Marland Kitchen.. 1 1/2 By Mouth Once Daily- No Additional Refills Without Office Visit 3)  Metoprolol Tartrate 50 Mg  Tabs (Metoprolol Tartrate) .Marland Kitchen.. 1 By Mouth Bid 4)  Furosemide 40 Mg  Tabs (Furosemide) .Marland Kitchen.. 1 By Mouth Qd 5)  Klor-Con M20 20 Meq  Tbcr (Potassium Chloride Crys Cr) .Marland Kitchen.. 1 By Mouth Qd 6)  Ramipril 5 Mg  Caps (Ramipril) .Marland Kitchen.. 1 By Mouth Bid 7)  Nitro-Dur 0.4 Mg/hr  Pt24 (Nitroglycerin) .... Prn 8)  Isosorbide Mononitrate Cr 30 Mg  Tb24 (Isosorbide Mononitrate) .... 1/2 By Mouth Qd 9)  Lipitor 40 Mg  Tabs (Atorvastatin Calcium) .... Take One At Bedtime 10)  Plavix 75 Mg  Tabs (Clopidogrel Bisulfate) .... Qd 11)  Fish Oil   Oil (Fish Oil) .Marland Kitchen.. 1 Tab By Mouth Once Daily 12)  One Touch Ultra Test Strips .... Use As Directed 13)  Bayer Aspirin 325 Mg  Tabs (Aspirin) .Marland Kitchen.. 1 Tab By Mouth Once Daily 14)  Alprazolam 0.25 Mg Tabs (Alprazolam) .Marland Kitchen.. 1 or 2 At Bedtime As Needed Insomnia  Allergies: 1)  ! * Ivp Dye  Past History:  Past Medical History: Last updated: 07/18/2008 Diabetes mellitus, type II Hyperlipidemia Hypertension DEFIBRILLATOR IMPLANT 12-08-- 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 08/08/2000  LIMA to LAD, SVG to  intermediate, SVG to OM2, SVG to distal RCA  * PORCELAIN GALLBLADDER LIVER FUNCTION TESTS, ABNORMAL, HX OF (ICD-V12.2) ANEMIA (ICD-285.9) DIVERTICULOSIS OF COLON (ICD-562.10) Hx of GASTRITIS (ICD-535.50) INSOMNIA-SLEEP DISORDER-UNSPEC (ICD-780.52) GAIT DISTURBANCE (ICD-781.2) CHOLELITHIASIS (ICD-574.20) CONGESTIVE HEART FAILURE (ICD-428.0) CAROTID ARTERY DISEASE (ICD-433.10) FAMILY HISTORY BREAST CANCER 1ST DEGREE RELATIVE <50 (ICD-V16.3) CARDIOMYOPATHY, SECONDARY NOS (ICD-425.9) HYPERTENSION (ICD-401.9) HYPERLIPIDEMIA (ICD-272.4) DIABETES MELLITUS, TYPE II (ICD-250.00)    Past Surgical History: Last updated: 07/18/2008 BYPASS SURGERY JUNE 1999 OR 2000 Coronary artery bypass graft  [employer] Cardiac Defibrillator implanted  Review of Systems  The patient denies chest pain, syncope, dyspnea on exertion, and peripheral edema.    Vital Signs:  Patient profile:   77 year old female Height:      66 inches Weight:      199 pounds BMI:     32.24 Pulse rate:   69 / minute BP sitting:   146 / 72  (left arm)  Vitals Entered By: Laurance Flatten CMA (April 14, 2009 4:26 PM)  Physical Exam  General:  Well developed, well nourished, in no acute distress. Head:  normocephalic and atraumatic Neck:  Neck supple, no JVD. No masses, thyromegaly or abnormal cervical nodes. Chest Wall:  Well healed ICD incision. Lungs:  Clear bilaterally to auscultation.  No wheezes, rales, or rhonchi. Heart:  Paradoxical splitting of the second heart sound.  Minimal systolic murmur. Abdomen:  Bowel sounds positive; abdomen soft and non-tender without masses, organomegaly, or hernias noted. No hepatosplenomegaly. Pulses:  2+ and symetric Extremities:  No clubbing or cyanosis.  Edema of LLE  (chronic) Neurologic:  Alert and oriented x 3.    ICD Specifications Following MD:  Lewayne Bunting, MD     ICD Vendor:  [facility] Jude     ICD Model Number:  [id number]     ICD Serial Number:  [id number] ICD DOI:  02/01/2007      ICD Implanting MD:  Lewayne Bunting, MD  Lead 1:    Location: RV     DOI: 02/01/2007     Model #: [id number]     Serial #: [id number]     Status: active  Indications::  ICM   ICD Follow Up Remote Check?  No Battery Voltage:  3.17 V     Charge Time:  10.8 seconds     Battery Est. Longevity:  6.4 years   ICD Device Measurements Right Ventricle:  Amplitude: 8.0 mV, Impedance: 440 ohms, Threshold: 0.75 V at 0.5 msec Shock Impedance: 48 ohms   Episodes Shock:  0     ATP:  0     Nonsustained:  0     Ventricular Pacing:  <1%  Brady Parameters Mode VVI     Lower Rate Limit:  40      Tachy Zones VF:  240     VT:  214     VT1:  176     Next Remote Date:  07/13/2009     Next Cardiology Appt Due:  03/24/2010 Tech Comments:  No parameter changes.   Checked by [employer].  [employer] transmissions every 3 months.  ROV 1 year Dr. Ladona Ridgel. Altha Harm, LPN  April 14, 2009 4:38 PM  MD Comments:  Agree with above.  Impression & Recommendations:  Problem # 1:  AUTOMATIC IMPLANTABLE CARDIAC DEFIBRILLATOR SITU (ICD-V45.02) Her device is working normally.  Will recheck in several months.  Problem # 2:  CARDIOMYOPATHY, ISCHEMIC (ICD-414.8) She denies c/p or sob. Continue current meds. Her updated medication list for this problem includes:    Metoprolol Tartrate 50 Mg Tabs (Metoprolol tartrate) .Marland Kitchen... 1 by mouth bid    Furosemide 40 Mg Tabs (Furosemide) .Marland Kitchen... 1 by mouth qd    Ramipril 5 Mg Caps (Ramipril) .Marland Kitchen... 1 by mouth bid    Nitro-dur 0.4 Mg/hr Pt24 (Nitroglycerin) .Marland Kitchen... Prn    Isosorbide Mononitrate Cr 30 Mg Tb24 (Isosorbide mononitrate) .Marland Kitchen... 1/2 by mouth qd    Plavix 75 Mg Tabs (Clopidogrel bisulfate) ..... Qd    Bayer Aspirin 325 Mg Tabs (Aspirin) .Marland Kitchen... 1 tab by mouth once daily  Problem # 3:  CHRONIC SYSTOLIC HEART FAILURE (ICD-428.22) She is euvolemic and will continue her current meds.  Maintain a low sodium diet. Her updated medication list for this problem includes:    Metoprolol Tartrate 50 Mg  Tabs (Metoprolol tartrate) .Marland Kitchen... 1 by mouth bid    Furosemide 40 Mg Tabs (Furosemide) .Marland Kitchen... 1 by mouth qd    Ramipril 5 Mg Caps (Ramipril) .Marland Kitchen... 1 by mouth bid    Nitro-dur 0.4 Mg/hr Pt24 (Nitroglycerin) .Marland Kitchen... Prn    Isosorbide Mononitrate Cr 30 Mg Tb24 (Isosorbide mononitrate) .Marland Kitchen... 1/2 by mouth qd    Plavix 75 Mg Tabs (Clopidogrel bisulfate) ..... Qd    Bayer Aspirin 325 Mg Tabs (Aspirin) .Marland Kitchen... 1 tab by mouth once daily  Patient Instructions: 1)  Your physician recommends that you schedule  a follow-up appointment in: 1 year.

## 2010-03-25 NOTE — Letter (Signed)
Summary: Diabetic Instructions  [facility]  [address] [place], [place] [address]   Phone: [phone]  Fax: [phone]    [name] [name] 10/11/1932 MRN: [id number]   _x  _   ORAL DIABETIC MEDICATION INSTRUCTIONS                           glimeperide, metformin The day before your procedure:   Take your diabetic pill as you do normally  The day of your procedure:   Do not take your diabetic pill    We will check your blood sugar levels during the admission process and again in Recovery before discharging you home  ________________________________________________________________________

## 2010-03-25 NOTE — Assessment & Plan Note (Signed)
Summary: FOLLOWUP --DISCUSS MEDS////SPH   Vital Signs:  Patient profile:   77 year old female Weight:      201.13 pounds Pulse rate:   67 / minute Pulse rhythm:   regular BP sitting:   128 / 80  (left arm) Cuff size:   regular  Vitals Entered By: Army Fossa CMA (November 04, 2009 3:35 PM) CC: Pt here for f/u- not fasting Comments Refill on Alprazolam Pharm- [facility] [address] flu shot   History of Present Illness: last office visit 3-11  she had mild anemia, + Hemoccult  status post GI evaluation, she had an EGD that showed Barrett's esophagus, next EGD 2013 CEA was negative   Diabetes ---   in AM sometimes feels weak/sweaty, CBG is in the 90s ("which is low for me"); othertimes they are 130-140. Sx decrease w/  p.o. sugar No LOC  Hyperlipidemia---- good medication compliance  Hypertension--- no ambulatory BPs, good med compliance    Carotid Artery Dz: chart reviewed re-angioplasty  right carotid artery 5-11       Current Medications (verified): 1)  Metformin Hcl 500 Mg  Tabs (Metformin Hcl) .... Two Times A Day- Needs To Scheduled Appt 2)  Glimepiride 4 Mg  Tabs (Glimepiride) .Marland Kitchen.. 1 1/2 By Mouth Once Daily- 3)  Metoprolol Tartrate 50 Mg  Tabs (Metoprolol Tartrate) .Marland Kitchen.. 1 By Mouth Bid 4)  Furosemide 40 Mg  Tabs (Furosemide) .Marland Kitchen.. 1 By Mouth Qd 5)  Klor-Con M20 20 Meq  Tbcr (Potassium Chloride Crys Cr) .Marland Kitchen.. 1 By Mouth Qd 6)  Ramipril 5 Mg  Caps (Ramipril) .Marland Kitchen.. 1 By Mouth Bid 7)  Nitro-Dur 0.4 Mg/hr  Pt24 (Nitroglycerin) .... Prn 8)  Isosorbide Mononitrate Cr 30 Mg  Tb24 (Isosorbide Mononitrate) .... 1/2 By Mouth Qd 9)  Lipitor 40 Mg  Tabs (Atorvastatin Calcium) .... Take One At Bedtime 10)  Plavix 75 Mg  Tabs (Clopidogrel Bisulfate) .... Qd 11)  One Touch Ultra Test Strips .... Use As Directed 12)  Bayer Aspirin 325 Mg  Tabs (Aspirin) .Marland Kitchen.. 1 Tab By Mouth Once Daily 13)  Alprazolam 0.25 Mg Tabs (Alprazolam) .Marland Kitchen.. 1 or 2 At Bedtime As Needed Insomnia 14)   Integra Plus  Caps (Fefum-Fepoly-Fa-B Cmp-C-Biot) .... Take 1 Tablet By Mouth Once Daily 15)  Prilosec Otc 20 Mg Tbec (Omeprazole Magnesium)  Allergies: 1)  ! * Ivp Dye  Past History:  Past Medical History: Diabetes mellitus, type II Hyperlipidemia Hypertension DEFIBRILLATOR IMPLANT 12-08-- St. Jude Single Chamber  Carotid Artery Dz: s/p stent R side 9-08, re stenosis 1-09, s/p angioplasty 1-09 and a stent on 12-09;  re-angioplasty   5-11 Congestive heart failure Diverticulitis 12-08 Porcelain GB Dx 12-08 CABG 08/08/2000  LIMA to LAD, SVG to intermediate, SVG to OM2, SVG to distal RCA Barrett's esophagus by EGD 08-2009, next 2013    Past Surgical History: Reviewed history from 07/18/2008 and no changes required. BYPASS SURGERY JUNE 1999 OR 2000 Coronary artery bypass graft  [employer] Judes Cardiac Defibrillator implanted  Social History: Reviewed history from 01/24/2008 and no changes required. Married Non smoker six children lives w/ husband still drives  independent on all ADL  Review of Systems CV:  Denies chest pain or discomfort; chronic L>R edema x years . Resp:  Denies cough and shortness of breath. GI:  Denies diarrhea, nausea, and vomiting.  Physical Exam  General:  alert and well-developed.   Lungs:  normal respiratory effort, no intercostal retractions, no accessory muscle use, and normal breath sounds.  Heart:  normal rate, regular rhythm, and no murmur.   Extremities:  trace edema B, L>>R (not new) Psych:  not anxious appearing and not depressed appearing.     Impression & Recommendations:  Problem # 1:  CT, CHEST, ABNORMAL (ICD-793.1) last CT 05-2009 shore increased opacity, she was referred to pulmonary but was busy with other health issues. Re-refer to pulmonary  Orders: Pulmonary Referral (Pulmonary)  Problem # 2:  ANEMIA (ICD-285.9) status post at EGD that showed Barrett esophagus, recheck a CBC on return to the office Her updated medication list  for this problem includes:    Integra Plus Caps (Fefum-fepoly-fa-b cmp-c-biot) .Marland Kitchen... Take 1 tablet by mouth once daily  Problem # 3:  HYPERTENSION (ICD-401.9)  at goal, labs Her updated medication list for this problem includes:    Metoprolol Tartrate 50 Mg Tabs (Metoprolol tartrate) .Marland Kitchen... 1 by mouth bid    Furosemide 40 Mg Tabs (Furosemide) .Marland Kitchen... 1 by mouth qd    Ramipril 5 Mg Caps (Ramipril) .Marland Kitchen... 1 by mouth bid    BP today: 128/80 Prior BP: 148/76 (09/04/2009)  Labs Reviewed: K+: 4.2 (03/25/2009) Creat: : 1.1 (03/25/2009)   Chol: 130 (03/25/2009)   HDL: 40.10 (03/25/2009)   LDL: 55 (03/25/2009)   TG: 174.0 (03/25/2009)  Orders: Venipuncture (16109) TLB-BMP (Basic Metabolic Panel-BMET) (80048-METABOL)  Problem # 4:  HYPERLIPIDEMIA (ICD-272.4)  labs Her updated medication list for this problem includes:    Lipitor 40 Mg Tabs (Atorvastatin calcium) .Marland Kitchen... Take one at bedtime    Labs Reviewed: SGOT: 15 (03/25/2009)   SGPT: 14 (03/25/2009)   HDL:40.10 (03/25/2009), 31.70 (05/27/2008)  LDL:55 (03/25/2009), 61 (05/27/2008)  Chol:130 (03/25/2009), 123 (05/27/2008)  Trig:174.0 (03/25/2009), 151.0 (05/27/2008)  Orders: TLB-Hepatic/Liver Function Pnl (80076-HEPATIC)  Problem # 5:  DIABETES MELLITUS, TYPE II (ICD-250.00)  having symptoms consistent with hypoglycemia in the morning (although blood sugar is only 90) Plan: Decrease Glimepiride 2 one tablet daily. Target hemoglobin A1c less than 8.0 Her updated medication list for this problem includes:    Metformin Hcl 500 Mg Tabs (Metformin hcl) .Marland Kitchen..Marland Kitchen Two times a day- needs to scheduled appt    Glimepiride 4 Mg Tabs (Glimepiride) .Marland Kitchen... 1   by mouth once daily    Ramipril 5 Mg Caps (Ramipril) .Marland Kitchen... 1 by mouth bid    Bayer Aspirin 325 Mg Tabs (Aspirin) .Marland Kitchen... 1 tab by mouth once daily    Labs Reviewed: Creat: 1.1 (03/25/2009)    Reviewed HgBA1c results: 7.0 (05/19/2009)  7.3 (05/27/2008)  Orders: TLB-A1C / Hgb A1C  (Glycohemoglobin) (83036-A1C)  Complete Medication List: 1)  Metformin Hcl 500 Mg Tabs (Metformin hcl) .... Two times a day- needs to scheduled appt 2)  Glimepiride 4 Mg Tabs (Glimepiride) .Marland Kitchen.. 1   by mouth once daily 3)  Metoprolol Tartrate 50 Mg Tabs (Metoprolol tartrate) .Marland Kitchen.. 1 by mouth bid 4)  Furosemide 40 Mg Tabs (Furosemide) .Marland Kitchen.. 1 by mouth qd 5)  Klor-con M20 20 Meq Tbcr (Potassium chloride crys cr) .Marland Kitchen.. 1 by mouth qd 6)  Ramipril 5 Mg Caps (Ramipril) .Marland Kitchen.. 1 by mouth bid 7)  Nitro-dur 0.4 Mg/hr Pt24 (Nitroglycerin) .... Prn 8)  Isosorbide Mononitrate Cr 30 Mg Tb24 (Isosorbide mononitrate) .... 1/2 by mouth qd 9)  Lipitor 40 Mg Tabs (Atorvastatin calcium) .... Take one at bedtime 10)  Plavix 75 Mg Tabs (Clopidogrel bisulfate) .... Qd 11)  One Touch Ultra Test Strips  .... Use as directed 12)  Bayer Aspirin 325 Mg Tabs (Aspirin) .Marland Kitchen.. 1 tab by mouth once  daily 13)  Alprazolam 0.25 Mg Tabs (Alprazolam) .Marland Kitchen.. 1 or 2 at bedtime as needed insomnia 14)  Integra Plus Caps (Fefum-fepoly-fa-b cmp-c-biot) .... Take 1 tablet by mouth once daily 15)  Prilosec Otc 20 Mg Tbec (Omeprazole magnesium)  Other Orders: Flu Vaccine 18yrs + MEDICARE PATIENTS (Z6109) Administration Flu vaccine - MCR (U0454) Flu Vaccine Consent Questions     Do you have a history of severe allergic reactions to this vaccine? no    Any prior history of allergic reactions to egg and/or gelatin? no    Do you have a sensitivity to the preservative Thimersol? no    Do you have a past history of Guillan-Barre Syndrome? no    Do you currently have an acute febrile illness? no    Have you ever had a severe reaction to latex? no    Vaccine information given and explained to patient? yes    Are you currently pregnant? no    Lot Number:[id number]   Exp Date:08/21/2010   Site Given  Right Deltoid IM  Patient Instructions: 1)  decrease Glimepiride take  only one tablet daily 2)  Call in 2 weeks if you're still having low sugar  symptoms in the morning 3)  Come back in 3 or 4 months for your regular checkup, fasting 4)  please remember to see your eye doctor for your yearly exam   .lbmedflu

## 2010-03-25 NOTE — Letter (Signed)
Summary: [facility]: Immunoassay Fecal Occult Blood (iFOB) Order Form  [facility] [place]  [address] [place], [place] [address]   Phone: [phone]  Fax: [phone]      [facility]: Immunoassay Fecal Occult Blood (iFOB) Order Form   May 22, 2009 MRN: [id number]   [name] [name] March 21, 1932   Physicican Name:___________paz______________  Diagnosis Code:__________v76.51________________      Shary Decamp

## 2010-03-25 NOTE — Assessment & Plan Note (Signed)
Summary: follow up from egd and labs/lk    History of Present Illness Visit Type: Follow-up Visit Primary GI MD: Lina Sar MD Primary Provider: [name], MD Requesting Provider: na Chief Complaint: F/u from labs and egd.  Pt states that she is doing great and denies any GI complaints  History of Present Illness:   This is a 77 year old white female with iron deficiency anemia and Barrett's esophagus found on an upper endoscopy on 09/11/09. She also had mild gastritis and normal small bowel biopsies. There was a 1 cm hiatal hernia. Patient was dilated with 48 [place] Maloney dilator. Her hemoglobin has improved on iron supplements from 10 to 11.3 on 09/11/09. Her iron saturation is increased to 15%. Patient has a history of porcelain gallbladder as per ERCP and ultrasound in January 2009. She has severe ischemic cardiomyopathy and is not a surgical candidate. She is followed by Dr. Riley Kill. She is on Plavix and aspirin after a left internal carotid angioplasty.   GI Review of Systems      Denies abdominal pain, acid reflux, belching, bloating, chest pain, dysphagia with liquids, dysphagia with solids, heartburn, loss of appetite, nausea, vomiting, vomiting blood, weight loss, and  weight gain.        Denies anal fissure, black tarry stools, change in bowel habit, constipation, diarrhea, diverticulosis, fecal incontinence, heme positive stool, hemorrhoids, irritable bowel syndrome, jaundice, light color stool, liver problems, rectal bleeding, and  rectal pain.    Current Medications (verified): 1)  Metformin Hcl 500 Mg  Tabs (Metformin Hcl) .... Two Times A Day- Needs To Scheduled Appt 2)  Glimepiride 4 Mg  Tabs (Glimepiride) .Marland Kitchen.. 1 1/2 By Mouth Once Daily- 3)  Metoprolol Tartrate 50 Mg  Tabs (Metoprolol Tartrate) .Marland Kitchen.. 1 By Mouth Bid 4)  Furosemide 40 Mg  Tabs (Furosemide) .Marland Kitchen.. 1 By Mouth Qd 5)  Klor-Con M20 20 Meq  Tbcr (Potassium Chloride Crys Cr) .Marland Kitchen.. 1 By Mouth Qd 6)  Ramipril 5 Mg  Caps  (Ramipril) .Marland Kitchen.. 1 By Mouth Bid 7)  Nitro-Dur 0.4 Mg/hr  Pt24 (Nitroglycerin) .... Prn 8)  Isosorbide Mononitrate Cr 30 Mg  Tb24 (Isosorbide Mononitrate) .... 1/2 By Mouth Qd 9)  Lipitor 40 Mg  Tabs (Atorvastatin Calcium) .... Take One At Bedtime 10)  Plavix 75 Mg  Tabs (Clopidogrel Bisulfate) .... Qd 11)  One Touch Ultra Test Strips .... Use As Directed 12)  Bayer Aspirin 325 Mg  Tabs (Aspirin) .Marland Kitchen.. 1 Tab By Mouth Once Daily 13)  Alprazolam 0.25 Mg Tabs (Alprazolam) .Marland Kitchen.. 1 or 2 At Bedtime As Needed Insomnia 14)  Integra Plus  Caps (Fefum-Fepoly-Fa-B Cmp-C-Biot) .... Take 1 Tablet By Mouth Once Daily 15)  Prilosec Otc 20 Mg Tbec (Omeprazole Magnesium)  Allergies (verified): 1)  ! * Ivp Dye  Past History:  Past Medical History: Reviewed history from 05/13/2009 and no changes required. Diabetes mellitus, type II Hyperlipidemia Hypertension DEFIBRILLATOR IMPLANT 12-08-- St. 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 08/08/2000  LIMA to LAD, SVG to intermediate, SVG to OM2, SVG to distal RCA     Past Surgical History: Reviewed history from 07/18/2008 and no changes required. BYPASS SURGERY JUNE 1999 OR 2000 Coronary artery bypass graft  [employer] Judes Cardiac Defibrillator implanted  Family History: mother had breast cancer age 95 Father died 67 TB lung resection no history of colon cancer  Family History Breast cancer 1st degree relative <50 No FH of Colon Cancer:  Social History: Reviewed history from 01/24/2008 and no changes required. Married Non smoker six children lives w/ husband still drives  independent on all ADL  Review of Systems  The patient denies allergy/sinus, anemia, anxiety-new, arthritis/joint pain, back pain, blood in urine, breast changes/lumps, change in vision, confusion, cough, coughing up blood,  depression-new, fainting, fatigue, fever, headaches-new, hearing problems, heart murmur, heart rhythm changes, itching, menstrual pain, muscle pains/cramps, night sweats, nosebleeds, pregnancy symptoms, shortness of breath, skin rash, sleeping problems, sore throat, swelling of feet/legs, swollen lymph glands, thirst - excessive , urination - excessive , urination changes/pain, urine leakage, vision changes, and voice change.         Pertinent positive and negative review of systems were noted in the above HPI. All other ROS was otherwise negative.   Vital Signs:  Patient profile:   77 year old female Height:      66 inches Pulse rate:   68 / minute Pulse rhythm:   regular BP sitting:   144 / 72  (left arm) Cuff size:   regular  Vitals Entered By: Ok Anis CMA (November 05, 2009 3:46 PM)  Physical Exam  General:  alert, oriented. She is in a wheelchair. She moves very slowly. Eyes:  nonicteric. Mouth:  No deformity or lesions, dentition normal. Neck:  no bruits. Lungs:  inspiratory rales. Heart:  normal S1, normal S2. Abdomen:  obese soft abdomen with normoactive bowel sounds and no tenderness. Rectal:  soft yellow Hemoccult-negative stool. Extremities:  2+ bilateral edema.   Impression & Recommendations:  Problem # 1:  * PORCELAIN GALLBLADDER Patient is currently asymptomatic. She is not a surgical candidate.  Problem # 2:  ANEMIA (ICD-285.9) Patient has an improved hemoglobin on iron supplements. She is Hemoccult-negative today. We will recheck her CBC and iron studies today and she needs to get back on her iron supplements. Because of the improvement in her hemoglobin, I will not push for colonoscopy which would be very risky in the setting of severe heart disease and decreased mobility. Orders: TLB-CBC Platelet - w/Differential (85025-CBCD) TLB-IBC Pnl (Iron/FE;Transferrin) (83550-IBC)  Patient Instructions: 1)  Please go to the lab before leaving our office today to  have your CBC and IBC panel drawn. 2)  Copy sent to : [name], MD 3)  Hold off on colonoscopy.  4)  Recheck CBC in 8 weeks. 5)  Continue iron supplements. 6)  The medication list was reviewed and reconciled.  All changed / newly prescribed medications were explained.  A complete medication list was provided to the patient / caregiver.

## 2010-03-25 NOTE — Progress Notes (Signed)
Summary: due ct chest  ---- Converted from flag ---- ---- 06/06/2008 12:56 PM, Jose E. Paz MD wrote: needs non contrast chest CT ------------------------------

## 2010-03-25 NOTE — Progress Notes (Signed)
Summary: fyi,  will schedule a pulmonary visit later  ---- Converted from flag ---- ---- 06/26/2009 1:42 PM, [name] E. Lenzie Sandler MD wrote: missed  her appointment with pulmonary. see if she is willing to reschedule ------------------------------ spoke with pt -- she is having some test done on 5/23 for Dr. Riley Kill & Dr. [name].  She will call me back after that to reschedule her pulmonary appt Shary Decamp  Jul 02, 2009 11:44 AM

## 2010-03-25 NOTE — Progress Notes (Signed)
Summary: update   Phone Note Call from Patient Call back at Home Phone [phone]   Caller: Patient Call For: Dr. Juanda Chance Reason for Call: Talk to Nurse Summary of Call: wants Dr. Juanda Chance to be aware that she has cancelled her iron infusion for tomorrow... will resch it on Monday and will call our office to let us know her new infusion date Initial call taken by: Vallarie Mare,  November 12, 2009 10:20 AM  Follow-up for Phone Call        noted Follow-up by: Darcey Nora RN, CGRN,  November 12, 2009 10:37 AM     Appended Document: update Patient never called short stay back to reschedule her iron infusion. When I called her to inquire, she states that she has to wait to schedule her appointment for when one of her children can bring her. I have advised her that we would like her to get this infusion completed soon as we do not want her iron levels getting even lower. Patient verbalizes understanding and I will follow up to make sure this infusion has been completed within the next week or so.  Appended Document: update I agree.

## 2010-03-25 NOTE — Letter (Signed)
Summary: Patient Notice-[facility]  [address] [place], [place] [address]   Phone: [phone]  Fax: [phone]        September 14, 2009 MRN: [id number]    [name] [name] [address] [place], [place]  [address]    Dear [name],  I am pleased to inform you that the biopsies taken during your recent endoscopic examination did not show any evidence of cancer upon pathologic examination.  However, your biopsies indicate you have a condition known as Barrett's esophagus. While not cancer, it is pre-cancerous (can progress to cancer) and needs to be monitored with repeat endoscopic examination and biopsies.  Fortunately, it is quite rare that this develops into cancer, but careful monitoring of the condition along with taking your medication as prescribed is important in reducing the risk of developing cancer.  It is my recommendation that you have a repeat upper gastrointestinal endoscopic examination in 2_ years.  Additional information/recommendations:  _x_Please call [phone] to schedule a return visit to further      evaluate your condition.  __Continue with treatment plan as outlined the day of your exam.  Please call us if you have or develop heartburn, reflux symptoms, any swallowing problems, or if you have questions about your condition that have not been fully answered at this time.  Sincerely,  Hart Carwin MD  This letter has been electronically signed by your physician.  Appended Document: Patient Notice-[facility] letter mailed

## 2010-03-25 NOTE — Op Note (Signed)
Summary: [facility]   Imported By: Sherian Rein 01/12/2010 07:39:20  _____________________________________________________________________  External Attachment:    Type:   Image     Comment:   External Document

## 2010-03-25 NOTE — Letter (Signed)
Summary: EGD Instructions  [facility]  [address] [place], [place] [address]   Phone: [phone]  Fax: [phone]       [name] [name]    Oct 06, 1932    MRN: [id number]       Procedure Day /Date: 09/11/09 Friday     Arrival Time: 9:00 am      Procedure Time: 10:00 am     Location of Procedure:                    _ x _ [facility] (4th Floor)  PREPARATION FOR ENDOSCOPY   On 09/11/09 THE DAY OF THE PROCEDURE:  1.   No solid foods, milk or milk products are allowed after midnight the night before your procedure.  2.   Do not drink anything colored red or purple.  Avoid juices with pulp.  No orange juice.  3.  You may drink clear liquids until 8:00 am, which is 2 hours before your procedure.                                                                                                CLEAR LIQUIDS INCLUDE: Water Jello Ice Popsicles Tea (sugar ok, no milk/cream) Powdered fruit flavored drinks Coffee (sugar ok, no milk/cream) Gatorade Juice: apple, white grape, white cranberry  Lemonade Clear bullion, consomm, broth Carbonated beverages (any kind) Strained chicken noodle soup Hard Candy   MEDICATION INSTRUCTIONS  Unless otherwise instructed, you should take regular prescription medications with a small sip of water as early as possible the morning of your procedure.  Diabetic patients - see separate instructions.  .Continue taking your Plavix for your procedure per Dr. Lina Sar.              OTHER INSTRUCTIONS  You will need a responsible adult at least 75 years of age to accompany you and drive you home.   This person must remain in the waiting room during your procedure.  Wear loose fitting clothing that is easily removed.  Leave jewelry and other valuables at home.  However, you may wish to bring a book to read or an iPod/MP3 player to listen to music as you wait for your procedure to start.  Remove all body piercing  jewelry and leave at home.  Total time from sign-in until discharge is approximately 2-3 hours.  You should go home directly after your procedure and rest.  You can resume normal activities the day after your procedure.  The day of your procedure you should not:   Drive   Make legal decisions   Operate machinery   Drink alcohol   Return to work  You will receive specific instructions about eating, activities and medications before you leave.    The above instructions have been reviewed and explained to me by   Lamona Curl CMA Duncan Dull)  September 04, 2009 3:31 PM     I fully understand and can verbalize these instructions _____________________________ Date 09/04/09

## 2010-03-25 NOTE — Progress Notes (Signed)
Summary: rx  Phone Note Call from Patient   Caller: Patient Summary of Call: pt called says completly out of BS meds, last refill says no refills w/o appt,  scheduled pt appt for 05/13/09.  and rx filled for 1 month  Initial call taken by: Kandice Hams,  April 22, 2009 9:12 AM    Prescriptions: GLIMEPIRIDE 4 MG  TABS (GLIMEPIRIDE) 1 1/2 by mouth once daily- NO ADDITIONAL REFILLS WITHOUT OFFICE VISIT  #45 x 0   Entered by:   Kandice Hams   Authorized by:   Nolon Rod. Paz MD   Signed by:   Kandice Hams on 04/22/2009   Method used:   Faxed to ...       [facility]  [address] [id number]* (retail)       [address]       [place], [place]  [address]       Ph: [phone] or [phone]       Fax: [phone]   RxID:   [id number] METFORMIN HCL 500 MG  TABS (METFORMIN HCL) two times a day - NO ADDITIONAL REFILLS WITHOUT OFFICE VISIT  #60 x 0   Entered by:   Kandice Hams   Authorized by:   Nolon Rod. Paz MD   Signed by:   Kandice Hams on 04/22/2009   Method used:   Faxed to ...       [facility]  [address] [id number]* (retail)       [address]       [place], [place]  [address]       Ph: [phone] or [phone]       Fax: [phone]   RxID:   [id number]

## 2010-03-25 NOTE — Letter (Signed)
Summary: Device-Delinquent Check  [facility], Main Office  [address]   [place], [place] [address]   Phone: [phone]  Fax: [phone]     February 09, 2010 MRN: [id number]   SUPRENA TRAVAGLINI [address] [place], [place]  [address]   Dear Ms. HIGHTOWER,  According to our records, you have not had your implanted device checked in the recommended period of time.  We are unable to determine appropriate device function without checking your device on a regular basis.  Please call our office to schedule an appointment , with Dr Audree Camel soon as possible.  If you are having your device checked by another physician, please call us so that we may update our records.  Thank you,  Letta Moynahan, EMT  February 09, 2010 12:23 PM  [facility]

## 2010-03-25 NOTE — Assessment & Plan Note (Signed)
Summary: ROV  Medications Added FISH OIL   OIL (FISH OIL) 1 tab by mouth once daily BAYER ASPIRIN 325 MG  TABS (ASPIRIN) 1 tab by mouth once daily        Primary Provider:  Nolon Rod. Paz MD  CC:  rov.  History of Present Illness: Overall she thought she was getting along well.  Her husband died last 09-30-22, and had pancreatic cancer which came on suddenly.  She has not had chest pain or significant shortness of breath.  Her heart does not bother her all that much.  Had anemia, and Dr. Drue Novel inquired about colon studies, but she never went back.    Current Medications (verified): 1)  Metformin Hcl 500 Mg  Tabs (Metformin Hcl) .... Bid 2)  Glimepiride 4 Mg  Tabs (Glimepiride) .Marland Kitchen.. 1 1/2 By Mouth Once Daily 3)  Metoprolol Tartrate 50 Mg  Tabs (Metoprolol Tartrate) .Marland Kitchen.. 1 By Mouth Bid 4)  Furosemide 40 Mg  Tabs (Furosemide) .Marland Kitchen.. 1 By Mouth Qd 5)  Klor-Con M20 20 Meq  Tbcr (Potassium Chloride Crys Cr) .Marland Kitchen.. 1 By Mouth Qd 6)  Ramipril 5 Mg  Caps (Ramipril) .Marland Kitchen.. 1 By Mouth Bid 7)  Nitro-Dur 0.4 Mg/hr  Pt24 (Nitroglycerin) .... Prn 8)  Isosorbide Mononitrate Cr 30 Mg  Tb24 (Isosorbide Mononitrate) .... 1/2 By Mouth Qd 9)  Lipitor 40 Mg  Tabs (Atorvastatin Calcium) .... Take One At Bedtime 10)  Plavix 75 Mg  Tabs (Clopidogrel Bisulfate) .... Qd 11)  Fish Oil   Oil (Fish Oil) .Marland Kitchen.. 1 Tab By Mouth Once Daily 12)  One Touch Ultra Test Strips .... Use As Directed 13)  Bayer Aspirin 325 Mg  Tabs (Aspirin) .Marland Kitchen.. 1 Tab By Mouth Once Daily 14)  Alprazolam 0.25 Mg Tabs (Alprazolam) .Marland Kitchen.. 1 or 2 At Bedtime As Needed Insomnia  Allergies: 1)  ! * Ivp Dye  Past History:  Past Medical History: Last updated: 07/18/2008 Diabetes mellitus, type II Hyperlipidemia Hypertension DEFIBRILLATOR IMPLANT 12-08-- St. 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  08/08/2000  LIMA to LAD, SVG to intermediate, SVG to OM2, SVG to distal RCA  * PORCELAIN GALLBLADDER LIVER FUNCTION TESTS, ABNORMAL, HX OF (ICD-V12.2) ANEMIA (ICD-285.9) DIVERTICULOSIS OF COLON (ICD-562.10) Hx of GASTRITIS (ICD-535.50) INSOMNIA-SLEEP DISORDER-UNSPEC (ICD-780.52) GAIT DISTURBANCE (ICD-781.2) CHOLELITHIASIS (ICD-574.20) CONGESTIVE HEART FAILURE (ICD-428.0) CAROTID ARTERY DISEASE (ICD-433.10) FAMILY HISTORY BREAST CANCER 1ST DEGREE RELATIVE <50 (ICD-V16.3) CARDIOMYOPATHY, SECONDARY NOS (ICD-425.9) HYPERTENSION (ICD-401.9) HYPERLIPIDEMIA (ICD-272.4) DIABETES MELLITUS, TYPE II (ICD-250.00)    Past Surgical History: Last updated: 07/18/2008 BYPASS SURGERY JUNE 1999 OR 2000 Coronary artery bypass graft  St Judes Cardiac Defibrillator implanted  Family History: Last updated: 01/24/2008 mother had breast cancer age 32 Father died 82 TB lung resection no history of colon cancer Family History Breast cancer 1st degree relative <50  Vital Signs:  Patient profile:   77 year old female Height:      66 inches Weight:      201 pounds BMI:     32.56 Pulse rate:   67 / minute Resp:     12 per minute BP sitting:   181 / 80  (left arm)  Vitals Entered By: Kem Parkinson (February 25, 2009 4:24 PM)  Physical Exam  General:  Well developed, well nourished, in no acute distress. Head:  normocephalic and atraumatic Lungs:  Clear bilaterally  to auscultation and percussion. Heart:  Paradoxical splitting of the second heart sound.  Minimal systolic murmur. Abdomen:  Bowel sounds positive; abdomen soft and non-tender without masses, organomegaly, or hernias noted. No hepatosplenomegaly. Extremities:  No clubbing or cyanosis.  Edema of LLE  (chronic) Neurologic:  Alert and oriented x 3.   EKG  Procedure date:  02/25/2009  Findings:      Normal rhythm.  Delay in R wave progression.  Low voltage QRS.  Non specific T flattening.   ICD Specifications ICD Vendor:  St  Jude     ICD Model Number:  [id number]     ICD Serial Number:  [id number] ICD DOI:  02/01/2007     ICD Implanting MD:  Lewayne Bunting, MD  Lead 1:    Location: RV     DOI: 02/01/2007     Model #: [id number]     Serial #: [id number]     Status: active  Indications::  ICM   Impression & Recommendations:  Problem # 1:  CARDIOMYOPATHY, SECONDARY NOS (ICD-425.9) On multiple drugs.  Functional status is quite good.  Needs lab studies done on multiple drugs. Her updated medication list for this problem includes:    Metoprolol Tartrate 50 Mg Tabs (Metoprolol tartrate) .Marland Kitchen... 1 by mouth bid    Furosemide 40 Mg Tabs (Furosemide) .Marland Kitchen... 1 by mouth qd    Ramipril 5 Mg Caps (Ramipril) .Marland Kitchen... 1 by mouth bid    Nitro-dur 0.4 Mg/hr Pt24 (Nitroglycerin) .Marland Kitchen... Prn    Isosorbide Mononitrate Cr 30 Mg Tb24 (Isosorbide mononitrate) .Marland Kitchen... 1/2 by mouth qd    Plavix 75 Mg Tabs (Clopidogrel bisulfate) ..... Qd    Bayer Aspirin 325 Mg Tabs (Aspirin) .Marland Kitchen... 1 tab by mouth once daily  Orders: EKG w/ Interpretation (93000) Echocardiogram (Echo)  Problem # 2:  ANEMIA (ICD-285.9) Dr. Drue Novel called her rec GI, and she did not follow.  Needs repeat and encouraged to follow up.  Problem # 3:  HYPERLIPIDEMIA (ICD-272.4) Needs lipid and liver profile checked.   Her updated medication list for this problem includes:    Lipitor 40 Mg Tabs (Atorvastatin calcium) .Marland Kitchen... Take one at bedtime  Problem # 4:  CAROTID ARTERY DISEASE (ICD-433.10) Has followup scheduled with Dr. Titus Dubin.  Had carotid stent treatment in Dec 2009.  She knows she is to follow up for continued monitoring of this. Her updated medication list for this problem includes:    Plavix 75 Mg Tabs (Clopidogrel bisulfate) ..... Qd    Bayer Aspirin 325 Mg Tabs (Aspirin) .Marland Kitchen... 1 tab by mouth once daily  Patient Instructions: 1)  Your physician recommends that you schedule a follow-up appointment in: 4 MONTHS 2)  Your physician recommends that you return for a FASTING LIPID,  LIVER, TSH, CBC, BMP (414.8, 428.22, 272.0, 401.9) 3)  Your physician recommends that you continue on your current medications as directed. Please refer to the Current Medication list given to you today. 4)  Your physician has requested that you have an echocardiogram.  Echocardiography is a painless test that uses sound waves to create images of your heart. It provides your doctor with information about the size and shape of your heart and how well your heart's chambers and valves are working.  This procedure takes approximately one hour. There are no restrictions for this procedure. 5)  Please call Dr Leta Jungling office and schedule a follow-up appointment.

## 2010-03-25 NOTE — Cardiovascular Report (Signed)
Summary: Office Visit   Office Visit   Imported By: Roderic Ovens 04/17/2009 11:42:38  _____________________________________________________________________  External Attachment:    Type:   Image     Comment:   External Document

## 2010-03-25 NOTE — Progress Notes (Signed)
   Phone Note From Other Clinic Call back at short stay [facility]   Caller: Nurse Summary of Call: Received a call from [name] [name] at [facility] short stay. Patient there for IV iron. Patient did not take her Lasix 40 mg by mouth today. Has swelling in lower extremities that has not really changed since this AM. Patient's BP this AM 114/53, now it is 150/93. Can patient be given Lasix 40 mg by mouth now? Initial call taken by: Jesse Fall RN,  December 22, 2009 3:43 PM  Follow-up for Phone Call        Per Dr. Juanda Chance may give Lasix 40 mg by mouth now. Called [name] [name] and let her know. Order faxed.    Prescriptions: LASIX 40 MG TABS (FUROSEMIDE) May give 40 mg by mouth now  #1 x 0   Entered by:   Jesse Fall RN   Authorized by:   Hart Carwin MD   Signed by:   Jesse Fall RN on 12/22/2009   Method used:   Print then Give to Patient   RxID:   [id number]

## 2010-03-25 NOTE — Assessment & Plan Note (Signed)
Summary: followup on BS/alr   Vital Signs:  Patient profile:   77 year old female Height:      66 inches Weight:      196 pounds BMI:     31.75 Pulse rate:   60 / minute BP sitting:   120 / 80  Vitals Entered By: Shary Decamp (May 13, 2009 3:58 PM) CC: rov - CBG @ home - non-fasting 160's, needs refill on metformin, glimperide   History of Present Illness: last  OV  a year ago Diabetes-- good medication compliance , ambulatory CBGs around 100 in AM,occ goes up to 170 later in the day  anemia-- has not seen GI as recommended before   Hypertension-- no ambulatory BPs , no symptoms of low BP    Current Medications (verified): 1)  Metformin Hcl 500 Mg  Tabs (Metformin Hcl) .... Two Times A Day - 2)  Glimepiride 4 Mg  Tabs (Glimepiride) .Marland Kitchen.. 1 1/2 By Mouth Once Daily- 3)  Metoprolol Tartrate 50 Mg  Tabs (Metoprolol Tartrate) .Marland Kitchen.. 1 By Mouth Bid 4)  Furosemide 40 Mg  Tabs (Furosemide) .Marland Kitchen.. 1 By Mouth Qd 5)  Klor-Con M20 20 Meq  Tbcr (Potassium Chloride Crys Cr) .Marland Kitchen.. 1 By Mouth Qd 6)  Ramipril 5 Mg  Caps (Ramipril) .Marland Kitchen.. 1 By Mouth Bid 7)  Nitro-Dur 0.4 Mg/hr  Pt24 (Nitroglycerin) .... Prn 8)  Isosorbide Mononitrate Cr 30 Mg  Tb24 (Isosorbide Mononitrate) .... 1/2 By Mouth Qd 9)  Lipitor 40 Mg  Tabs (Atorvastatin Calcium) .... Take One At Bedtime 10)  Plavix 75 Mg  Tabs (Clopidogrel Bisulfate) .... Qd 11)  Fish Oil   Oil (Fish Oil) .Marland Kitchen.. 1 Tab By Mouth Once Daily 12)  One Touch Ultra Test Strips .... Use As Directed 13)  Bayer Aspirin 325 Mg  Tabs (Aspirin) .Marland Kitchen.. 1 Tab By Mouth Once Daily 14)  Alprazolam 0.25 Mg Tabs (Alprazolam) .Marland Kitchen.. 1 or 2 At Bedtime As Needed Insomnia  Allergies (verified): 1)  ! * Ivp Dye  Past History:  Past Medical History: Diabetes mellitus, type II Hyperlipidemia Hypertension DEFIBRILLATOR IMPLANT 12-08-- [employer]. Jude Single Chamber  Carotid Artery Dz: s/p stent R side 9-08, re stenosis 1-09, s/p angioplasty 1-09 and a stent on 12-09 Congestive heart  failure Diverticulitis 12-08 Porcelain GB Dx 12-08 CABG 08/08/2000  LIMA to LAD, SVG to intermediate, SVG to OM2, SVG to distal RCA     Past Surgical History: Reviewed history from 07/18/2008 and no changes required. BYPASS SURGERY JUNE 1999 OR 2000 Coronary artery bypass graft  St Judes Cardiac Defibrillator implanted  Family History: Reviewed history from 01/24/2008 and no changes required. mother had breast cancer age 63 Father died 57 TB lung resection no history of colon cancer Family History Breast cancer 1st degree relative <50  Review of Systems CV:  Denies chest pain or discomfort and swelling of feet. GI:  Denies abdominal pain, bloody stools, nausea, and vomiting. Endo:  feels like sometimes his CBGs are low , slightly sweaty.  "I just know mu sugar is low", has not check a CBG but usually eats something right away and feels better .  Physical Exam  General:  alert and well-developed.  alert and well-developed.   Lungs:  normal respiratory effort, no intercostal retractions, no accessory muscle use, and normal breath sounds.   Heart:  normal rate, regular rhythm, and no murmur.   Abdomen:  soft, non-tender, no distention, no masses, no guarding, and no rigidity.  soft, non-tender, no distention,  no masses, no guarding, and no rigidity.   Psych:  not anxious appearing and not depressed appearing.  not anxious appearing and not depressed appearing.     Impression & Recommendations:  Problem # 1:  LIVER FUNCTION TESTS, ABNORMAL, HX OF (ICD-V12.2) last alkaline phosphate is slightly elevated, repeat in few months  Problem # 2:  HYPERTENSION (ICD-401.9) at goal  Her updated medication list for this problem includes:    Metoprolol Tartrate 50 Mg Tabs (Metoprolol tartrate) .Marland Kitchen... 1 by mouth bid    Furosemide 40 Mg Tabs (Furosemide) .Marland Kitchen... 1 by mouth qd    Ramipril 5 Mg Caps (Ramipril) .Marland Kitchen... 1 by mouth bid  BP today: 120/80 Prior BP: 146/72 (04/14/2009)  Labs  Reviewed: K+: 4.2 (03/25/2009) Creat: : 1.1 (03/25/2009)   Chol: 130 (03/25/2009)   HDL: 40.10 (03/25/2009)   LDL: 55 (03/25/2009)   TG: 174.0 (03/25/2009)  Her updated medication list for this problem includes:    Metoprolol Tartrate 50 Mg Tabs (Metoprolol tartrate) .Marland Kitchen... 1 by mouth bid    Furosemide 40 Mg Tabs (Furosemide) .Marland Kitchen... 1 by mouth qd    Ramipril 5 Mg Caps (Ramipril) .Marland Kitchen... 1 by mouth bid  Problem # 3:  ANEMIA (ICD-285.9) discussed with patient why I am concerned about her anemia,   ? cancer plan: labs and i FOB again offer her a referral to GI understanding that she may not be a candidate for aggressive workup  Orders: Gastroenterology Referral (GI)  Problem # 4:  DIABETES MELLITUS, TYPE II (ICD-250.00) reports good medication compliance labs Her updated medication list for this problem includes:    Metformin Hcl 500 Mg Tabs (Metformin hcl) .Marland Kitchen..Marland Kitchen Two times a day -    Glimepiride 4 Mg Tabs (Glimepiride) .Marland Kitchen... 1 1/2 by mouth once daily-    Ramipril 5 Mg Caps (Ramipril) .Marland Kitchen... 1 by mouth bid    Bayer Aspirin 325 Mg Tabs (Aspirin) .Marland Kitchen... 1 tab by mouth once daily  Her updated medication list for this problem includes:    Metformin Hcl 500 Mg Tabs (Metformin hcl) .Marland Kitchen..Marland Kitchen Two times a day -    Glimepiride 4 Mg Tabs (Glimepiride) .Marland Kitchen... 1 1/2 by mouth once daily-    Ramipril 5 Mg Caps (Ramipril) .Marland Kitchen... 1 by mouth bid    Bayer Aspirin 325 Mg Tabs (Aspirin) .Marland Kitchen... 1 tab by mouth once daily  Problem # 5:  HYPERLIPIDEMIA (ICD-272.4) samples requested and provided  Her updated medication list for this problem includes:    Lipitor 40 Mg Tabs (Atorvastatin calcium) .Marland Kitchen... Take one at bedtime  Complete Medication List: 1)  Metformin Hcl 500 Mg Tabs (Metformin hcl) .... Two times a day - 2)  Glimepiride 4 Mg Tabs (Glimepiride) .Marland Kitchen.. 1 1/2 by mouth once daily- 3)  Metoprolol Tartrate 50 Mg Tabs (Metoprolol tartrate) .Marland Kitchen.. 1 by mouth bid 4)  Furosemide 40 Mg Tabs (Furosemide) .Marland Kitchen.. 1 by mouth  qd 5)  Klor-con M20 20 Meq Tbcr (Potassium chloride crys cr) .Marland Kitchen.. 1 by mouth qd 6)  Ramipril 5 Mg Caps (Ramipril) .Marland Kitchen.. 1 by mouth bid 7)  Nitro-dur 0.4 Mg/hr Pt24 (Nitroglycerin) .... Prn 8)  Isosorbide Mononitrate Cr 30 Mg Tb24 (Isosorbide mononitrate) .... 1/2 by mouth qd 9)  Lipitor 40 Mg Tabs (Atorvastatin calcium) .... Take one at bedtime 10)  Plavix 75 Mg Tabs (Clopidogrel bisulfate) .... Qd 11)  Fish Oil Oil (Fish oil) .Marland Kitchen.. 1 tab by mouth once daily 12)  One Touch Ultra Test Strips  .... Use as directed 13)  Bayer Aspirin 325 Mg Tabs (Aspirin) .Marland Kitchen.. 1 tab by mouth once daily 14)  Alprazolam 0.25 Mg Tabs (Alprazolam) .Marland Kitchen.. 1 or 2 at bedtime as needed insomnia  Patient Instructions: 1)  please come back for the following labs (not  fasting) 2)  Hemoglobin A1c, microalbumin diabetes 3)  iron-ferritin-folic acid-B12  dx anemia  4)  complete the  stool card 5)  Please schedule a follow-up appointment in 3 months .

## 2010-03-25 NOTE — Letter (Signed)
Summary: Device-Delinqu[facility] Phone Journalist, newspaper, Main Office  [address]   [place], [place] [address]   Phone: [phone]  Fax: [phone]     September 08, 2009 MRN: [id number]   [name] [name] [address] [place], [place]  [address]   Dear [name] [name],  According to our records, you were scheduled for a device phone transmission on  07-13-2009                            .     We did not receive any results from this check.  If you transmitted on your scheduled day, please call us to help troubleshoot your system.  If you forgot to send your transmission, please send one upon receipt of this letter.  Thank you,   Arch[facility]ural [facility]

## 2010-03-25 NOTE — Miscellaneous (Signed)
Summary: Orders Update  Clinical Lists Changes  Orders: Added new Test order of T-2 View CXR (71020TC) - Signed 

## 2010-03-25 NOTE — Progress Notes (Signed)
Summary: due ov  Phone Note Outgoing Call Call back at [facility] Phone [phone]   Summary of Call: Enrique Sack, Last ov 04/2008.  She was due for rov 08/2008.  I refilled her med x 1 month only.  Needs to have ov scheduled before next refill is due Initial call taken by: Shary Decamp,  March 16, 2009 2:02 PM    Additional Follow-up for Phone Call Additional follow up Details #2::    patient states she will call to scheduled an appt the the first of feb.Marland KitchenMarland KitchenBarb Merino  March 17, 2009 10:58 AM  Follow-up by: Barb Merino,  March 17, 2009 10:58 AM

## 2010-03-25 NOTE — Progress Notes (Signed)
Summary: refill Alprazolam  Phone Note Refill Request Message from:  Patient  Refills Requested: Medication #1:  ALPRAZOLAM 0.25 MG TABS 1 or 2 at bedtime as needed insomnia. Initial call taken by: Kandice Hams,  June 09, 2009 3:35 PM  Follow-up for Phone Call        last refill #60 x 0 on 05/19/09, last ov 05/13/09.Kandice Hams  June 09, 2009 3:41 PM  Follow-up by: Kandice Hams,  June 09, 2009 3:41 PM  Additional Follow-up for Phone Call Additional follow up Details #1::        ok 60 and 2 RF Jose E. Paz MD  June 09, 2009 5:21 PM     Prescriptions: ALPRAZOLAM 0.25 MG TABS (ALPRAZOLAM) 1 or 2 at bedtime as needed insomnia  #60 x 2   Entered by:   Kandice Hams   Authorized by:   Nolon Rod. Paz MD   Signed by:   Kandice Hams on 06/10/2009   Method used:   Printed then faxed to ...       [facility]  [address] # [id number]* (retail)       [address]       [place], [place]  [address]       Ph: [phone] or [phone]       Fax: [phone]   RxID:   [id number]

## 2010-03-25 NOTE — Progress Notes (Signed)
Summary: Needs [facility] Appointment-Per Dr Diamond Nickel   Phone Note Outgoing Call   Call placed by: Lamona Curl CMA Duncan Dull),  September 02, 2009 3:01 PM Call placed to: Patient Summary of Call: Patient called and cancelled appointment with Dr Juanda Chance for 09/02/09 for evaluation of her anemia. I have called patient at the request of Dr Juanda Chance to let her know that we need to see her before the appointment she rescheduled for 11/17/09. Her hemoglobin is too unstable to wait that long for evaluation. I was unable to speak to the patient and there was no voicemail to leave a message on. I will call her back at a later time. Initial call taken by: Lamona Curl CMA Duncan Dull),  September 02, 2009 3:03 PM  Follow-up for Phone Call        Patient will come for appointment on 09/04/09 @ 3:00 pm. Follow-up by: Lamona Curl CMA (AAMA),  September 03, 2009 12:41 PM

## 2010-03-25 NOTE — Progress Notes (Signed)
Summary: refill   Phone Note Refill Request Message from:  Patient on January 11, 2010 10:01 AM  Refills Requested: Medication #1:  ISOSORBIDE MONONITRATE CR 30 MG  TB24 1/2 by mouth qd [facility] [address]  Initial call taken by: Judie Grieve,  January 11, 2010 10:01 AM  Follow-up for Phone Call        Rx faxed to pharmacy. Vikki Ports  January 11, 2010 2:10 PM     Prescriptions: ISOSORBIDE MONONITRATE CR 30 MG  TB24 (ISOSORBIDE MONONITRATE) 1/2 by mouth qd  #15 x 6   Entered by:   Vikki Ports   Authorized by:   Ronaldo Miyamoto, MD, [facility]   Signed by:   Vikki Ports on 01/11/2010   Method used:   Faxed to ...       [facility]  [address] [id number]* (retail)       [address]       [place], [place]  [address]       Ph: [phone] or [phone]       Fax: [phone]   RxID:   [id number]

## 2010-03-25 NOTE — Letter (Signed)
Summary: Appointment - Missed  [facility], Main Office  [address]   [place], [place] [address]   Phone: [phone]  Fax: [phone]     July 23, 2009 MRN: [id number]   [name] [name] [address] [place], [place]  [address]   Dear [name],  Our records indicate you missed your appointment on 07/02/2009 with Dr. Riley Kill.                                    It is very important that we reach you to reschedule this appointment. We look forward to participating in your health care needs. Please contact us at the number listed above at your earliest convenience to reschedule this appointment.     Sincerely, Neurosurgeon Team LG

## 2010-03-25 NOTE — Procedures (Signed)
Summary: Upper Endoscopy  Patient: [name] Note: All result statuses are Final unless otherwise noted.  Tests: (1) Upper Endoscopy (EGD)   EGD Upper Endoscopy       DONE     [facility]     [address]     [place], [place]  [address]           ENDOSCOPY PROCEDURE REPORT           PATIENT:  [name] [name], [name] [name]  MR#:  [id number]     BIRTHDATE:  [birth year], 76 yrs. old  GENDER:  female           ENDOSCOPIST:  Hedwig Morton. Juanda Chance, MD     Referred by:  [name], [name]           PROCEDURE DATE:  09/11/2009     PROCEDURE:  EGD with biopsy, Maloney Dilation of Esophagus     ASA CLASS:  Class III     INDICATIONS:  iron deficiency anemia, hemeoccult positive stool pt     on Plavix and ASA,           MEDICATIONS:   Versed 3 mg, Fentanyl 25 mcg     TOPICAL ANESTHETIC:  Exactacain Spray           DESCRIPTION OF PROCEDURE:   After the risks benefits and     alternatives of the procedure were thoroughly explained, informed     consent was obtained.  The [facility] [id number] endoscope was     introduced through the mouth and advanced to the second portion of     the duodenum, without limitations.  The instrument was slowly     withdrawn as the mucosa was fully examined.     <<PROCEDUREIMAGES>>           A stricture was found in the distal esophagus. mild nonobstructing     es (see image1). stricture at 32 cm, maloney dilator 11F Maloney     passed without difficulty  irregular Z-line in the distal     esophagus. With standard forceps, a biopsy was obtained and sent     to pathology (see image1, image4, image5, and image6).  A hiatal     hernia was found (see image7). 1 cm small hiatal hernia  Mild     gastritis was found. decreased rugal folds With standard forceps,     a biopsy was obtained and sent to pathology.  Otherwise the     examination was normal. With standard forceps, a biopsy was     obtained and sent to pathology (see image3 and image2). r/o     villous atrophy     Retroflexed views revealed no abnormalities.     The scope was then withdrawn from the patient and the procedure     completed.           COMPLICATIONS:  None           ENDOSCOPIC IMPRESSION:     1) Stricture in the distal esophagus     2) Irregular Z-line in the distal esophagus     3) Hiatal hernia     4) Mild gastritis     5) Otherwise normal examination     s/p passage of 11F dilator, no active bleeding to account for     heme positive stools     RECOMMENDATIONS:     1) Await biopsy results     continue iron supplements  assess pt as to risks for colonoscopy     continue Plavix and ASA     recheck CBC, Iron studies today           REPEAT EXAM:  In 0 year(s) for.           ______________________________     Hedwig Morton. Juanda Chance, MD           CC:           n.     eSIGNED:   Hedwig Morton. Vladimir Lenhoff at 09/11/2009 10:36 AM           Berna Bue, [id number]  Note: An exclamation mark (!) indicates a result that was not dispersed into the flowsheet. Document Creation Date: 09/11/2009 10:38 AM _______________________________________________________________________  (1) Order result status: Final Collection or observation date-time: 09/11/2009 10:23 Requested date-time:  Receipt date-time:  Reported date-time:  Referring Physician:   Ordering Physician: Lina Sar [phone]) Specimen Source:  Source: Launa Grill Order Number: [id number] Lab site:   Appended Document: Upper Endoscopy     Procedures Next Due Date:    EGD: 09/2011

## 2010-03-25 NOTE — Progress Notes (Signed)
Summary: appt  Phone Note Outgoing Call   Call placed by: Army Fossa CMA,  October 27, 2009 8:18 AM Summary of Call: Pt has to make an follow up appt before additional refills on meds. Army Fossa CMA  October 27, 2009 8:19 AM   Follow-up for Phone Call        PT WILL COME IN 9/14 Follow-up by: Jerolyn Shin,  October 28, 2009 4:00 PM

## 2010-03-25 NOTE — Letter (Signed)
Summary: New Patient letter  [facility]  [address] [place], [place] [address]   Phone: [phone]  Fax: [phone]       05/14/2009 MRN: [id number]  [name] [name] [address] [place], [place]  [address]  Dear [name] [name],  Welcome to the Gastroenterology Division at [facility].    You are scheduled to see Dr. Juanda Chance on 07-02-09 at 9:15a.m. on the 3rd floor at [facility], [address]  We ask that you try to arrive at our office 15 minutes prior to your appointment time to allow for check-in.  We would like you to complete the enclosed self-administered evaluation form prior to your visit and bring it with you on the day of your appointment.  We will review it with you.  Also, please bring a complete list of all your medications or, if you prefer, bring the medication bottles and we will list them.  Please bring your insurance card so that we may make a copy of it.  If your insurance requires a referral to see a specialist, please bring your referral form from your primary care physician.  Co-payments are due at the time of your visit and may be paid by cash, check or credit card.     Your office visit will consist of a consult with your physician (includes a physical exam), any laboratory testing he/she may order, scheduling of any necessary diagnostic testing (e.g. x-ray, ultrasound, CT-scan), and scheduling of a procedure (e.g. Endoscopy, Colonoscopy) if required.  Please allow enough time on your schedule to allow for any/all of these possibilities.    If you cannot keep your appointment, please call [phone] to cancel or reschedule prior to your appointment date.  This allows [place] the opportunity to schedule an appointment for another patient in need of care.  If you do not cancel or reschedule by 5 p.m. the business day prior to your appointment date, you will be charged a $50.00 late cancellation/no-show fee.    Thank you for choosing [facility] for your medical needs.  We appreciate the opportunity to care for you.  Please visit [place] at our website  to learn more about our practice.                     Sincerely,                                                             The Gastroenterology Division

## 2010-03-25 NOTE — Assessment & Plan Note (Signed)
Summary: anemia--ch.    History of Present Illness Visit Type: Initial Consult Primary GI MD: Lina Sar MD Primary Provider: [name], MD Requesting Provider: [name], MD Chief Complaint: Anemia and Chronic constipation. Pt denies blood in stool, abd pain, or any other GI sx. History of Present Illness:   This is a 77 year old white female with iron deficiency anemia and heme positive stools on home screening. Her hemoglobin on April 20th was 10.0, serum iron was 37 with a ferritin of 38. Her Hemoccult cards were all positive. She has a history of anemia and fluctuating blood counts. Her hemoglobin in 2009 was 13. She has never had an upper endoscopy or colonoscopy. She has a history of ischemic cardiomyopathy with an ejection fraction of 25-30%. She had an angioplasty of the right internal carotid artery twice; most recently in May 2011.She is on Plavix and ASA.  We did an ERCP  in April 1999 because of abnormal liver function tests and dilated common bile duct with stones. The ERCP showed a nonobstructed common bile duct and periampullary diverticulum.She had a dilated nonobstructed CBD. 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 Her most recent liver function tests have been normal. Her last ultrasound in January 2009 showed a thickened gallbladder wall with stones at the neck of the gallbladder. There is no family history of colon cancer. Her bowel habits are regular. She denies any visible blood per rectum. She has never take n iron pills.   GI Review of Systems      Denies abdominal pain, acid reflux, belching, bloating, chest pain, dysphagia with liquids, dysphagia with solids, heartburn, loss of appetite, nausea, vomiting, vomiting blood, weight loss, and  weight gain.        Denies anal fissure, black tarry stools, change in bowel habit, constipation, diarrhea, diverticulosis, fecal incontinence, heme positive stool, hemorrhoids, irritable  bowel syndrome, jaundice, light color stool, liver problems, rectal bleeding, and  rectal pain.    Current Medications (verified): 1)  Metformin Hcl 500 Mg  Tabs (Metformin Hcl) .... Two Times A Day - 2)  Glimepiride 4 Mg  Tabs (Glimepiride) .Marland Kitchen.. 1 1/2 By Mouth Once Daily- 3)  Metoprolol Tartrate 50 Mg  Tabs (Metoprolol Tartrate) .Marland Kitchen.. 1 By Mouth Bid 4)  Furosemide 40 Mg  Tabs (Furosemide) .Marland Kitchen.. 1 By Mouth Qd 5)  Klor-Con M20 20 Meq  Tbcr (Potassium Chloride Crys Cr) .Marland Kitchen.. 1 By Mouth Qd 6)  Ramipril 5 Mg  Caps (Ramipril) .Marland Kitchen.. 1 By Mouth Bid 7)  Nitro-Dur 0.4 Mg/hr  Pt24 (Nitroglycerin) .... Prn 8)  Isosorbide Mononitrate Cr 30 Mg  Tb24 (Isosorbide Mononitrate) .... 1/2 By Mouth Qd 9)  Lipitor 40 Mg  Tabs (Atorvastatin Calcium) .... Take One At Bedtime 10)  Plavix 75 Mg  Tabs (Clopidogrel Bisulfate) .... Qd 11)  Fish Oil   Oil (Fish Oil) .Marland Kitchen.. 1 Tab By Mouth Once Daily 12)  One Touch Ultra Test Strips .... Use As Directed 13)  Bayer Aspirin 325 Mg  Tabs (Aspirin) .Marland Kitchen.. 1 Tab By Mouth Once Daily 14)  Alprazolam 0.25 Mg Tabs (Alprazolam) .Marland Kitchen.. 1 or 2 At Bedtime As Needed Insomnia  Allergies (verified): 1)  ! * Ivp Dye  Past History:  Past Medical History: Reviewed history from 05/13/2009 and no changes required. Diabetes mellitus, type II Hyperlipidemia Hypertension DEFIBRILLATOR IMPLANT 12-08-- St. Jude Single Chamber  Carotid Artery Dz: s/p stent R side 9-08, re stenosis 1-09, s/p angioplasty 1-09 and  a stent on 12-09 Congestive heart failure Diverticulitis 12-08 Porcelain GB Dx 12-08 CABG 08/08/2000  LIMA to LAD, SVG to intermediate, SVG to OM2, SVG to distal RCA     Past Surgical History: Reviewed history from 07/18/2008 and no changes required. BYPASS SURGERY JUNE 1999 OR 2000 Coronary artery bypass graft  St Judes Cardiac Defibrillator implanted  Family History: Reviewed history from 01/24/2008 and no changes required. mother had breast cancer age 75 Father died 49 TB lung  resection no history of colon cancer Family History Breast cancer 1st degree relative <50  Social History: Reviewed history from 01/24/2008 and no changes required. Married Non smoker six children lives w/ husband still drives  independent on all ADL  Review of Systems       The patient complains of fatigue.  The patient denies allergy/sinus, anemia, anxiety-new, arthritis/joint pain, back pain, blood in urine, breast changes/lumps, change in vision, confusion, cough, coughing up blood, depression-new, fainting, fever, headaches-new, hearing problems, heart murmur, heart rhythm changes, itching, menstrual pain, muscle pains/cramps, night sweats, nosebleeds, pregnancy symptoms, shortness of breath, skin rash, sleeping problems, sore throat, swelling of feet/legs, swollen lymph glands, thirst - excessive , urination - excessive , urination changes/pain, urine leakage, vision changes, and voice change.         Pertinent positive and negative review of systems were noted in the above HPI. All other ROS was otherwise negative.   Vital Signs:  Patient profile:   77 year old female Height:      66 inches Weight:      198.25 pounds BMI:     32.11 Pulse rate:   70 / minute Pulse rhythm:   regular BP sitting:   148 / 76  (right arm) Cuff size:   regular  Vitals Entered By: Christie Nottingham CMA Duncan Dull) (September 04, 2009 2:46 PM)  Physical Exam  General:  alert, oriented and very cooperative. Overweight. Neck:  jugular veins not distended. I could not appreciate any bruits. Lungs:  normal breath sounds. Heart:  normal S1, normal S2, no murmur. Abdomen:  obese soft abdomen which is nontender with normoactive bowel sounds. No palpable mass. Liver edge at costal margin, no distention. Rectal:  soft Hemoccult negative stool. Extremities:  2+ edema of both lower extremities. Skin:  contractures both hands. Psych:  Alert and cooperative. Normal mood and affect.   Impression &  Recommendations:  Problem # 1:  ANEMIA (ICD-285.9) Patient has microcytic anemia with Hemoccult-positive stool which was not  reproduced on today's exam. We will repeat her CBC today as well as her  iron studies. She is at risk for conscious sedation because of her cardiomyopathy and carotid artery disease. I would recommend both an endoscopy and colonoscopy, but because of her comorbidities, we will proceed first with an upper endoscopy to see if we can find source of bleeding  such as gastritis, small ulcer or AVM's. She will be started on iron supplements at this point and we will keep her on Plavix and aspirin throughout the procedure. If her upper endoscopy is negative, I will all check with Dr. Riley Kill who is her cardiologist to see whether he would support a screening colonoscopy.if we did not hold Plavix. Orders: TLB-CBC Platelet - w/Differential (85025-CBCD) TLB-IBC Pnl (Iron/FE;Transferrin) (83550-IBC) TLB-CEA (Carcinoembryonic Antigen) (82378-CEA) EGD (EGD)  Problem # 2:  * PORCELAIN GALLBLADDER Patient has normal liver function tests and no symptoms of biliary dysfunction.  Patient Instructions: 1)  You have been scheduled for an endoscopy at  [place] on 09/11/09 @ 10 am. Please arrive at 9 am for registration. 2)  Continue your Plavix before your EGD. 3)  Please go to the lab in the basement before leaving the office today in order to have your CBC and IBC, CEA levels drawn. 4)  We have sent a prescription for Integra Plus (iron) to your pharmacy to take once daily. You may pick it up at any time. 5)  Copy sent to : Dr [name] 6)  The medication list was reviewed and reconciled.  All changed / newly prescribed medications were explained.  A complete medication list was provided to the patient / caregiver. Prescriptions: INTEGRA PLUS  CAPS (FEFUM-FEPOLY-FA-B CMP-C-BIOT) Take 1 tablet by mouth once daily  #30 x 3   Entered by:   Lamona Curl CMA (AAMA)   Authorized by:   Hart Carwin MD   Signed by:   Lamona Curl CMA (AAMA) on 09/04/2009   Method used:   Electronically to        [id number] (retail)       [address]       [place], [place]  [address]       Ph: [phone] or [phone]       Fax: [phone]   RxID:   [id number]

## 2010-03-25 NOTE — Miscellaneous (Signed)
Summary: Orders Update  Clinical Lists Changes  Orders: Added new Test order of TLB-CBC Platelet - w/Differential (85025-CBCD) - Signed Added new Test order of TLB-IBC Pnl (Iron/FE;Transferrin) (83550-IBC) - Signed 

## 2010-03-25 NOTE — Progress Notes (Signed)
Summary: Metformin refill  Phone Note Refill Request Message from:  Patient on January 11, 2010 10:13 AM  Refills Requested: Medication #1:  METFORMIN HCL 500 MG  TABS two times a day- NEEDS TO SCHEDULED APPT I scheduled patient appt and sent enough refills to last until that time.  Initial call taken by: Lucious Groves CMA,  January 11, 2010 10:13 AM    Prescriptions: METFORMIN HCL 500 MG  TABS (METFORMIN HCL) two times a day- NEEDS TO SCHEDULED APPT  #30 Tablet x 2   Entered by:   Lucious Groves CMA   Authorized by:   Nolon Rod. Paz MD   Signed by:   Lucious Groves CMA on 01/11/2010   Method used:   Electronically to        [id number] (retail)       [address]       [place], [place]  [address]       Ph: [phone] or [phone]       Fax: [phone]   RxID:   [id number]

## 2010-04-07 ENCOUNTER — Encounter (INDEPENDENT_AMBULATORY_CARE_PROVIDER_SITE_OTHER): Payer: Self-pay | Admitting: *Deleted

## 2010-04-14 NOTE — Letter (Signed)
Summary: Primary Care Appointment Letter  [facility]  [address] [place], [place] [address]   Phone: [phone]  Fax: [phone]    04/07/2010 MRN: [id number]  [name] [name] [address] [place], [place]  [address]  Dear Ms. Joelene Millin,   Your Primary Care Physician Nolon Rod. Paz MD has indicated that:    ___X____it is time to schedule an appointment.    _______you missed your appointment on______ and need to call and          reschedule.    _______you need to have lab work done.    _______you need to schedule an appointment discuss lab or test results.    _______you need to call to reschedule your appointment that is                       scheduled on _________.     Please call our office as soon as possible. Our phone number is [phone]. Please press option 1. Our office is open 8a-12noon and 1p-5p, Monday through Friday.     Thank you,    Army Fossa CMA  April 07, 2010 8:24 AM

## 2010-05-04 ENCOUNTER — Ambulatory Visit: Payer: Self-pay | Admitting: Internal Medicine

## 2010-05-04 DIAGNOSIS — Z0289 Encounter for other administrative examinations: Secondary | ICD-10-CM

## 2010-05-08 LAB — GLUCOSE, CAPILLARY
Glucose-Capillary: 114 mg/dL — ABNORMAL HIGH (ref 70–99)
Glucose-Capillary: 115 mg/dL — ABNORMAL HIGH (ref 70–99)

## 2010-05-10 LAB — BRAIN NATRIURETIC PEPTIDE
Pro B Natriuretic peptide (BNP): 411 pg/mL — ABNORMAL HIGH (ref 0.0–100.0)
Pro B Natriuretic peptide (BNP): 811 pg/mL — ABNORMAL HIGH (ref 0.0–100.0)

## 2010-05-10 LAB — CBC
HCT: 27.1 % — ABNORMAL LOW (ref 36.0–46.0)
HCT: 33.5 % — ABNORMAL LOW (ref 36.0–46.0)
Hemoglobin: 9.3 g/dL — ABNORMAL LOW (ref 12.0–15.0)
MCV: 85.2 fL (ref 78.0–100.0)
Platelets: 159 10*3/uL (ref 150–400)
Platelets: 172 10*3/uL (ref 150–400)
RBC: 3.38 MIL/uL — ABNORMAL LOW (ref 3.87–5.11)
RDW: 15.3 % (ref 11.5–15.5)
WBC: 10.4 10*3/uL (ref 4.0–10.5)
WBC: 12.4 10*3/uL — ABNORMAL HIGH (ref 4.0–10.5)
WBC: 14.6 10*3/uL — ABNORMAL HIGH (ref 4.0–10.5)

## 2010-05-10 LAB — APTT: aPTT: 28 seconds (ref 24–37)

## 2010-05-10 LAB — PROTIME-INR
INR: 1 (ref 0.00–1.49)
Prothrombin Time: 13.1 seconds (ref 11.6–15.2)

## 2010-05-10 LAB — BASIC METABOLIC PANEL
BUN: 21 mg/dL (ref 6–23)
Calcium: 8.9 mg/dL (ref 8.4–10.5)
Calcium: 9.2 mg/dL (ref 8.4–10.5)
Chloride: 102 mEq/L (ref 96–112)
Creatinine, Ser: 1.02 mg/dL (ref 0.4–1.2)
GFR calc Af Amer: >60 mL/min (ref >60)
GFR calc non Af Amer: 42 mL/min — ABNORMAL LOW (ref >60)
GFR calc non Af Amer: 53 mL/min — ABNORMAL LOW (ref >60)
Potassium: 4.5 mEq/L (ref 3.5–5.1)
Sodium: 140 mEq/L (ref 135–145)

## 2010-05-10 LAB — GLUCOSE, CAPILLARY
Glucose-Capillary: 114 mg/dL — ABNORMAL HIGH (ref 70–99)
Glucose-Capillary: 190 mg/dL — ABNORMAL HIGH (ref 70–99)
Glucose-Capillary: 251 mg/dL — ABNORMAL HIGH (ref 70–99)
Glucose-Capillary: 259 mg/dL — ABNORMAL HIGH (ref 70–99)
Glucose-Capillary: 286 mg/dL — ABNORMAL HIGH (ref 70–99)
Glucose-Capillary: 293 mg/dL — ABNORMAL HIGH (ref 70–99)
Glucose-Capillary: 63 mg/dL — ABNORMAL LOW (ref 70–99)
Glucose-Capillary: 76 mg/dL (ref 70–99)

## 2010-05-10 LAB — DIFFERENTIAL
Basophils Absolute: 0 10*3/uL (ref 0.0–0.1)
Lymphocytes Relative: 22 % (ref 12–46)
Lymphs Abs: 2.2 10*3/uL (ref 0.7–4.0)
Neutro Abs: 7.3 10*3/uL (ref 1.7–7.7)
Neutrophils Relative %: 70 % (ref 43–77)

## 2010-05-10 LAB — HEPARIN LEVEL (UNFRACTIONATED): Heparin Unfractionated: 0.18 IU/mL — ABNORMAL LOW (ref 0.30–0.70)

## 2010-05-11 LAB — GLUCOSE, CAPILLARY
Glucose-Capillary: 216 mg/dL — ABNORMAL HIGH (ref 70–99)
Glucose-Capillary: 240 mg/dL — ABNORMAL HIGH (ref 70–99)

## 2010-05-11 LAB — BASIC METABOLIC PANEL
BUN: 22 mg/dL (ref 6–23)
CO2: 24 mEq/L (ref 19–32)
GFR calc non Af Amer: 41 mL/min — ABNORMAL LOW (ref >60)
Glucose, Bld: 295 mg/dL — ABNORMAL HIGH (ref 70–99)
Potassium: 4.2 mEq/L (ref 3.5–5.1)
Sodium: 137 mEq/L (ref 135–145)

## 2010-05-11 LAB — CBC
HCT: 35.3 % — ABNORMAL LOW (ref 36.0–46.0)
Hemoglobin: 12.1 g/dL (ref 12.0–15.0)
MCHC: 34.3 g/dL (ref 30.0–36.0)
Platelets: 182 10*3/uL (ref 150–400)
RDW: 14.6 % (ref 11.5–15.5)

## 2010-05-11 LAB — APTT: aPTT: 29 seconds (ref 24–37)

## 2010-06-14 ENCOUNTER — Other Ambulatory Visit: Payer: Self-pay | Admitting: Internal Medicine

## 2010-06-14 NOTE — Telephone Encounter (Signed)
Pt has no pending appts. Has been notified by phone and letter and on her rx's. She no showed for her last appt.

## 2010-06-14 NOTE — Telephone Encounter (Signed)
Ok 1 month and 1 RF Advise patient: no further RF w/o OV

## 2010-06-28 ENCOUNTER — Other Ambulatory Visit: Payer: Self-pay | Admitting: Cardiology

## 2010-06-28 NOTE — Telephone Encounter (Signed)
Pt calling to let you know she made an appt and would like to get plavix renewed

## 2010-06-30 ENCOUNTER — Encounter: Payer: Self-pay | Admitting: Internal Medicine

## 2010-07-01 ENCOUNTER — Ambulatory Visit (INDEPENDENT_AMBULATORY_CARE_PROVIDER_SITE_OTHER): Payer: Medicare Other | Admitting: Internal Medicine

## 2010-07-01 ENCOUNTER — Encounter: Payer: Self-pay | Admitting: Internal Medicine

## 2010-07-01 DIAGNOSIS — R269 Unspecified abnormalities of gait and mobility: Secondary | ICD-10-CM

## 2010-07-01 DIAGNOSIS — G4700 Insomnia, unspecified: Secondary | ICD-10-CM

## 2010-07-01 DIAGNOSIS — I2589 Other forms of chronic ischemic heart disease: Secondary | ICD-10-CM

## 2010-07-01 DIAGNOSIS — R930 Abnormal findings on diagnostic imaging of skull and head, not elsewhere classified: Secondary | ICD-10-CM

## 2010-07-01 DIAGNOSIS — E785 Hyperlipidemia, unspecified: Secondary | ICD-10-CM

## 2010-07-01 DIAGNOSIS — E119 Type 2 diabetes mellitus without complications: Secondary | ICD-10-CM

## 2010-07-01 DIAGNOSIS — D509 Iron deficiency anemia, unspecified: Secondary | ICD-10-CM

## 2010-07-01 DIAGNOSIS — I10 Essential (primary) hypertension: Secondary | ICD-10-CM

## 2010-07-01 DIAGNOSIS — K219 Gastro-esophageal reflux disease without esophagitis: Secondary | ICD-10-CM

## 2010-07-01 LAB — CBC WITH DIFFERENTIAL/PLATELET
Basophils Relative: 0.4 % (ref 0.0–3.0)
Eosinophils Absolute: 0.2 10*3/uL (ref 0.0–0.7)
Eosinophils Relative: 2.4 % (ref 0.0–5.0)
Hemoglobin: 11.6 g/dL — ABNORMAL LOW (ref 12.0–15.0)
Lymphocytes Relative: 21.9 % (ref 12.0–46.0)
Monocytes Relative: 7.5 % (ref 3.0–12.0)
Neutrophils Relative %: 67.8 % (ref 43.0–77.0)
RBC: 3.99 Mil/uL (ref 3.87–5.11)
WBC: 9.4 10*3/uL (ref 4.5–10.5)

## 2010-07-01 LAB — AST: AST: 14 U/L (ref 0–37)

## 2010-07-01 LAB — HEMOGLOBIN A1C: Hgb A1c MFr Bld: 6.9 % — ABNORMAL HIGH (ref 4.6–6.5)

## 2010-07-01 LAB — FERRITIN: Ferritin: 218.1 ng/mL (ref 10.0–291.0)

## 2010-07-01 LAB — BASIC METABOLIC PANEL
BUN: 18 mg/dL (ref 6–23)
CO2: 29 mEq/L (ref 19–32)
Chloride: 101 mEq/L (ref 96–112)
Glucose, Bld: 86 mg/dL (ref 70–99)
Potassium: 4 mEq/L (ref 3.5–5.1)

## 2010-07-01 LAB — LIPID PANEL
LDL Cholesterol: 54 mg/dL (ref 0–99)
Total CHOL/HDL Ratio: 3

## 2010-07-01 MED ORDER — ALPRAZOLAM 0.25 MG PO TABS: ORAL_TABLET | ORAL | Status: DC

## 2010-07-01 MED ORDER — FAMOTIDINE 40 MG PO TABS: 40.0000 mg | ORAL_TABLET | Freq: Every evening | ORAL | Status: DC

## 2010-07-01 NOTE — Assessment & Plan Note (Signed)
Good compliance with Lipitor, cost is an issue, will provide samples if available. Labs

## 2010-07-01 NOTE — Assessment & Plan Note (Signed)
Saw GI a few months ago, had an EGD 09-09-2009, see report. They are reluctant to perform a colonoscopy. Labs.

## 2010-07-01 NOTE — Assessment & Plan Note (Signed)
-   per cardiology

## 2010-07-01 NOTE — Assessment & Plan Note (Signed)
Blood sugar occasionally in the 90s. No change. Labs

## 2010-07-01 NOTE — Assessment & Plan Note (Signed)
RF xanax.

## 2010-07-01 NOTE — Patient Instructions (Signed)
Stop Prilosec, start Pepcid. If your acid reflux returns, let me know

## 2010-07-01 NOTE — Assessment & Plan Note (Signed)
Uses a walker and a cane. Recommended physical therapy, pt will think about it.

## 2010-07-01 NOTE — Progress Notes (Signed)
  Subjective:    Patient ID: [name] [name], female    DOB: 1932/05/15, 77 y.o.   MRN: [id number]  HPI Routine office visit, here with her daughter Aurther Loft. Saw  GI September 2011, they recommended observation for the porcelain gallbladder. As far as the anemia, she was doing better and they decided not to pursue a colonoscopy at that time. Had a transfusion 9-11 per daughter. In September, she was re-refer to pulmonary due to abnormal CT chest, she did not go . As far as the diabetes, due to symptoms of hypoglycemia, we adjusted her medications a few months ago. Currently, twice a month her sugar goes down to the 90s in the morning, she feels is a little weak, drink something sweet helps w/  Sx.  Past Medical History  Diagnosis Date  . Diabetes mellitus   . Hyperlipidemia   . Hypertension   . CAD (coronary artery disease)     s/p stent R side 9/08, re stenosis 1/09, s/p angioplasty 1/09 and a stent on 12/09; re-angioplasty 5/11  . CHF (congestive heart failure)   . Diverticulitis 12/08  . 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 Hx of CABG 08/08/00    LIMA to LAD, SVG to intermediate, SVG to OM2, SVG to distal RCA  . Barrett esophagus     EGD 08/2009, next 2013  . Carotid arterial disease     s/p stent R side 9-08, re stenosis 1-09, s/p angioplasty 1-09 and a stent on 12-09, last  angiogram 06-24-2009  . Abnormal CT scan, chest     last 06-08-2009, see report   . Cardiac device in situ, other      12-08-- St. Jude Single Chamber    Past Surgical History  Procedure Date  . Arterial bypass surgry 1999  . Coronary artery bypass graft   . Cardiac defibrillator placement     st judes     Social History: Married, lost husband 2010. Lives by herself, has a children w/ her at night  Non smoker six children Not driving. independent on ADL    Review of Systems  Constitutional:       Daughter reports she is doing well mentally, her gait is slightly worse than before, uses a cane and a walker.   Respiratory: Negative for cough and shortness of breath.   Cardiovascular: Negative for chest pain. Leg swelling: L LE edema since CABG, worse lately?       No orthopnea  Gastrointestinal: Negative for nausea, abdominal pain, diarrhea and blood in stool.       GERD symptoms well controlled with Plavix  Genitourinary: Negative for dysuria and hematuria.  Psychiatric/Behavioral:       Needs a  refill on Xanax       Objective:   Physical Exam  Constitutional: She appears well-developed and well-nourished.  Cardiovascular: Normal rate, regular rhythm and normal heart sounds.   Pulmonary/Chest: Effort normal and breath sounds normal. No respiratory distress. She has no wheezes. She has no rales.  Musculoskeletal:       Left leg and ankle are bigger than the right, mild pitting edema. According to the family, seems at baseline          Assessment & Plan:

## 2010-07-01 NOTE — Assessment & Plan Note (Addendum)
Long history of abnormal CTs, 05/2009. Has not seen pulmonary as recommended. I believe given her overall medical status that she won't be a candidate for aggressive workup. This was discussed with the patient and her family. Daughter Aurther Loft agrees. Will observe .

## 2010-07-01 NOTE — Assessment & Plan Note (Signed)
Currently asymptomatic on Prilosec however she is also on Plavix. Recommend to switch to Pepcid

## 2010-07-01 NOTE — Assessment & Plan Note (Signed)
Well controlled with the present regimen. Labs

## 2010-07-05 ENCOUNTER — Telehealth: Payer: Self-pay | Admitting: *Deleted

## 2010-07-05 MED ORDER — INTEGRA PLUS PO CAPS: ORAL_CAPSULE | ORAL | Status: DC

## 2010-07-05 NOTE — Telephone Encounter (Signed)
Pt is aware.  

## 2010-07-05 NOTE — Telephone Encounter (Signed)
Message copied by Army Fossa on Mon Jul 05, 2010  3:09 PM ------      Message from: [name]      Created: Mon Jul 05, 2010  2:53 PM       Advise patient (or call her Daughter):      Cholesterol and DM are  well controlled.      Her hemoglobin is okay but the iron is slightly lower compared to a few months ago. Please advise patient to go back to iron ,INTEGRA PLUS  CAPS (FEFUM-FEPOLY-FA-B CMP-C-BIOT) Take 1 tablet by mouth once daily      Good results.

## 2010-07-06 NOTE — Consult Note (Signed)
NAMEGAETANA, KAWAHARA                ACCOUNT NO.:  [id number]   MEDICAL RECORD NO.:  [id number]          PATIENT TYPE:  OUT   LOCATION:  XRAY                         FACILITY:  [facility]   PHYSICIAN:  Sanjeev K. [name], M.D.DATE OF BIRTH:  11/16/1933   DATE OF CONSULTATION:  12/11/2006  DATE OF DISCHARGE:                                 CONSULTATION   DATE OF CONSULT:  December 11, 2006.   CHIEF COMPLAINT:  Cerebrovascular disease.   HISTORY OF PRESENT ILLNESS:  This is a very pleasant 77 year old female  referred to Dr. [name] through the courtesy of Dr. Riley Kill and Dr.  Pearlean Brownie.  The patient was admitted to [facility] on  November 07, 2006, with a right CVA.  An MRI, MRA was performed that  was consistent with a high-grade severe focal stenosis of the right  internal carotid artery at the petrous cavernous junction with a  suspicion of a high-grade stenosis of the mid basilar artery as well.  A  cerebral angiogram was performed on November 09, 2006, by Dr.  [name].  This revealed a 95% stenosis of the right internal carotid  artery as well as a 65-70% stenosis of the distal basilar and mid  basilar junction.  Arrangements were made to have the patient return to  [facility] on November 20, 2006, at which time she  underwent PTA stenting of the right internal carotid artery performed  under general anesthesia without significant complications.  During her  hospital stay, she did have a vasovagal episode and was seen in  consultation by [facility].  She returns today to be seen in  follow-up by Dr. [name].  She is accompanied by her husband.   PAST MEDICAL HISTORY:  Significant for  1. Diabetes mellitus.  2. Hypertension.  3. Hyperlipidemia.  4. Congestive heart failure.  5. She has left ventricular dysfunction with an ejection fraction of      30-35%.  6. She has history of a mild anemia.  7. She is status post coronary  artery bypass graft surgery for      coronary artery disease.  8. She recently had a cardiac catheterization that showed continued      patency of her previously placed grafts although the saphenous vein      graft to the distal right coronary artery had a total occlusion of      the right coronary artery beyond the graft insertion.  9. She has a history of congestive heart failure.  10.She had a recent right CVA November 07, 2006, with very little      residual deficits.   PAST SURGICAL HISTORY:  Significant for  1. Tonsillectomy.  2. Coronary artery bypass graft surgery.  She denies previous problems with anesthesia.   MEDICATIONS:  Aspirin, Plavix, metoprolol, Lasix, Lipitor, potassium  supplement, Altace, glyburide, nitroglycerin p.r.n., vitamins and fish  oil.   ALLERGIES:  The patient is allergic to CONTRAST DYE.  She has had  headaches as well as nausea and vomiting secondary to contrast dye.  She  was given  a 13-hour prednisone protocol prior to her cerebral angiogram.   SOCIAL HISTORY:  The patient is married.  [place]  She has six children who are healthy.  She  has never been a smoker.  She does not use alcohol.  She is retired from  a [employer] consultant.   FAMILY HISTORY:  Mother died from breast cancer.  Her father died from  tuberculosis.   IMPRESSION AND PLAN:  As noted, the patient returns today accompanied by  her husband to be seen in follow-up by Dr. [name].  She continues on  the above-noted medications including her aspirin and Plavix.  Dr.  [name] reviewed the images from her recent angiogram including the  before and after pictures of the stenting procedure.  The patient has  had no further transient ischemic attack or cerebrovascular accident-  like symptoms.  She has had no neurological symptoms whatsoever.  She is  due to see her cardiologist, Dr. Riley Kill, next week.  She will be   scheduled for a cerebral angiogram in approximately 3-1/2 months to  further evaluate her cerebrovascular disease as well as patency of the  stent.   Greater than 15 minutes was spent on this consult.      Delton See, P.A.    ______________________________  [name]. [name], [name]    DR/MEDQ  D:  12/11/2006  T:  12/12/2006  Job:  [id number]   cc:   Pramod P. Pearlean Brownie, MD  Arturo Morton. Riley Kill, MD, [facility]  [name], MD

## 2010-07-06 NOTE — Cardiovascular Report (Signed)
NAMEFELESHA, [name] [name]                ACCOUNT NO.:  [id number]   MEDICAL RECORD NO.:  [id number]          PATIENT TYPE:  OIB   LOCATION:  3703                         FACILITY:  [facility]   PHYSICIAN:  Arturo Morton. Riley Kill, MD, FACCDATE OF BIRTH:  11/16/1933   DATE OF PROCEDURE:  DATE OF DISCHARGE:                            CARDIAC CATHETERIZATION   INDICATIONS:  [name] [name] is a very pleasant woman, well-known to me.  She previously had an anterior wall myocardial infarction.  Following  this, she underwent revascularization surgery with an internal mammary  to the LAD, saphenous vein graft to the OM, intermediate and distal  right coronary.  In the interim, she has done reasonably well but  recently stopped all of her medications.  These were slowly re-  instituted.  The patient had an episode of some chest discomfort.  According to the family, she has also not been 100%.  With the episodes  of chest pain, and prior revascularization surgery with diabetes, it was  felt that repeat cardiac catheterization was indicated.   PROCEDURE:  1. Left heart catheterization.  2. Selective coronary arteriography.  3. Selective left ventriculography.  4. Saphenous vein graft angiography.  5. Selective left internal mammary angiography.   DESCRIPTION OF PROCEDURE:  The patient was brought to the  catheterization laboratory and prepped and draped in usual fashion.  Through an anterior puncture, the right femoral artery was easily  entered, and a 5-French sheath was placed.  Following this, brief views  of the native coronary arteries  were obtained, which were occluded.  Following this, vein graft angiography was performed.  We used a  Glidewire to gain access to the left subclavian, and an internal mammary  catheter well opacified the IMA.  She tolerated procedure quite well and  there were no complications.  She was taken to the holding area in  satisfactory clinical condition.  The patient's family  requested that  she be admitted and considered for MRI.   HEMODYNAMIC DATA:  1. Central aortic pressure 167/76, mean 114.  2. Left ventricular pressure 162/23, mean 30.  3. No gradient pullback across aortic valve.   ANGIOGRAPHIC DATA:  1. The left main is free of critical disease.  2. The LAD is totally occluded.  3. The circumflex is occluded after a first marginal branch, which is      small to moderate in size.  There may be mild irregularity at the      ostium of this vessel, but no significant high-grade focal      obstruction.  4. The right coronary artery is totally occluded.  5. The internal mammary to the LAD is widely patent.  The LAD distally      has multiple areas of 30% narrowing, and collateralizes the PDA.  6. The saphenous vein graft to the intermediate is widely patent.      This collateralizes the AV circumflex.  7. The saphenous vein graft to the obtuse marginal 2 is intact, and it      also collateralizes the AV circumflex and third marginal.  8. The saphenous vein  graft to the distal right is intact, but the      distal right coronary artery is occluded.  As noted previously, it      fills retrograde from the internal mammary to the LAD.  9. The ventriculogram demonstrates akinesis of the inferior segment      with mild mitral regurgitation.  The anterolateral segment moves      well.   CONCLUSIONS:  1. Moderate reduction in left ventricular function with estimated      ejection fraction in the 30 to 35% range, with mild mitral      regurgitation and inferior wall motion abnormality.  2. Continued patency of the internal mammary to the LAD with perhaps      some recovery of the anterior wall.  3. Continued patency of saphenous vein grafts to the intermediate and      obtuse marginal.  4. Continued patency of the saphenous vein graft to the distal right,      but total occlusion of the right beyond the graft insertion, and      retrograde filling of the  right from the previous vessel.   PLAN:  I have discussed this with the patient's family.  We will go  ahead and get her admitted and do a MRI and 2-D echo.  She can probably  be discharged in the morning.  This is Arturo Morton.  He notes and the  patient      Arturo Morton. Riley Kill, MD, [facility]  Electronically Signed     TDS/MEDQ  D:  11/07/2006  T:  11/07/2006  Job:  [id number]   cc:   [name], MD

## 2010-07-06 NOTE — Consult Note (Signed)
NAMEELAINA, [name] [name] NO.:  [id number]   MEDICAL RECORD NO.:  [id number]          PATIENT TYPE:  INP   LOCATION:  3114                         FACILITY:  [facility]   PHYSICIAN:  Madolyn Frieze. Jens Som, MD, FACCDATE OF BIRTH:  11/16/1933   DATE OF CONSULTATION:  DATE OF DISCHARGE:                                 CONSULTATION   [name] [name] is a 77 year old female with a past medical history of  coronary artery disease coronary artery disease, status post cardiac  catheterization, diabetes, hyperlipidemia, hypertension and status post  right carotid stent who we are asked to evaluate for bradycardia and  hypotension.  The patient recently was admitted to [facility]  and during that admission she was found to have had a right CVA.  The  MRA at that time was consistent with a high-grade severe focal stenosis  of the right internal carotid artery.  A cerebral arteriogram revealed a  95% right internal carotid artery stenosis.  She was discharged on  November 10, 2006.  She returned and had a right carotid stent placed  yesterday.  This morning she received 10 mg of intravenous Apresoline.  She then had her sheath removed from her right femoral artery.  She  subsequently developed nausea and diaphoresis as well as a systolic  blood pressure of 79 and a heart rate of 49.  Because of the above, we  were asked to further evaluate.  Note, the patient did receive IV fluids  and her systolic blood pressure improved to 105 and her heart rate to  60.  She did not have chest pain, shortness of breath, or abdominal  pain.  There was mild back pain from lying flat but this did not appear  to be significant.  Her nausea persisted at the time of the evaluation.   MEDICATIONS:  Include:  1. Aspirin 325 mg daily.  2. Ancef 1 g IV q.8.  3. Plavix 75 mg daily.  4. Lasix 40 mg daily.  5. Amaryl 4 mg p.o. b.i.d.  6. Lopressor 100 mg p.o. b.i.d.  7. Multivitamin.  8. Potassium 2 mEq  daily.  9. Altace 5 mg p.o. daily.  10.Zocor 40 mg p.o. q.h.s.   ALLERGIES:  SHE HAS KNOWN ALLERGY TO CONTRAST.   SOCIAL HISTORY:  She does not smoke, nor does she consume alcohol.  She  lives with her husband.   FAMILY HISTORY:  Negative for coronary artery disease.   PAST MEDICAL HISTORY:  Significant for diabetes mellitus, hypertension,  hyperlipidemia.  She has a history of coronary disease.  She is status  post cardiac catheterization on November 07, 2006.  She had patent  grafts.  There was 100% occlusion of her distal right coronary artery  with failed left to right collaterals and she has been treated  medically.  She also had a right sided CVA in September of this year.  She has a history of ischemic cardiomyopathy with ejection fraction of  30% to 35%.   REVIEW OF SYSTEMS:  She denies any headaches, fevers or chills.  There  is no  potential cough or hemoptysis. There is no dysphagia, odynophagia,  melena, or hematochezia.  There is no dysuria or hematuria.  There is no  orthopnea or PND but she does have chronic pedal edema, left greater  than right.  She also has mild back pain at the time of the evaluation  and nausea.  The remaining systems are negative.   PHYSICAL EXAMINATION:  VITAL SIGNS:  Her blood pressure is now 105/40.  Her pulse is 60.  She is well-developed and somewhat obese.  She is in  no acute distress.  SKIN:  Warm and dry.  She is not depressed and there is no peripheral  problem.  HEENT:  Normal with no abnormality.  NECK:  Supple with a normal upstroke bilaterally.  I could not  appreciate bruits, jugular venous distention or thyromegaly.  CHEST:  Clear to auscultation anteriorly.  CARDIOVASCULAR EXAMINATION:  Regular rate and rhythm with a normal S1  and S2.  There is a 2/6 systolic ejection murmur heard at the left  sternal border.  She has had a previous sternotomy.  ABDOMEN EXAMINATION:  Nontender.  Positive bowel sounds.  No   hepatosplenomegaly.  No masses appreciated.  She has a 2+ pedal pulse on  the left.  The right is not palpated as there is a large bandage in  place from a recent sheath removal.  I could not palpate hematomas.  EXTREMITIES:  Showed trace edema bilaterally.  I could not palpate  cords.  Her distal pulses are diminished.  NEUROLOGIC EXAMINATION:  Grossly intact.   Her electrocardiogram shows a sinus rhythm with an inferior infarct in  the left.  The T-wave changes are unchanged from earlier this month.  Her hemoglobin and hematocrit today are 11 and 32.8.  Her BUN and  creatinine are 12 and 0.9 with a potassium of 3.7.   DIAGNOSES:  1. Bradycardia/hypotension - I think the most likely etiology of this      is a vasovagal episode following sheath removal.  Her systolic      blood pressure has now improved and her heart rate is back to 60.      Her nausea does persist.  However, she improved over the next      several hours.  Note, she is not having chest pain and there are no      electrocardiographic changes.  Also note her hemoglobin is 11 and      there is no evidence of hematoma on examination.  We will plan to      repeat her CBC later this evening.  For now, we will hold her blood      pressure medications until her blood pressure improves.  Also note,      we will need to be careful with her intravenous fluids as she does      have a history of heart failure.  2. Diabetes mellitus - Per her primary care service.  3. Hypertension - as per above, we will hold her blood pressure      medicines until her blood pressure improves.  4. Hyperlipidemia - She will continue on her statin.  5. Status post carotid artery stent - She will continue on her aspirin      and Plavix.  This is being managed by the interventional      radiologist.   We will be happy to follow while she is in the hospital.      Madolyn Frieze. Jens Som, MD, [facility]  Electronically Signed     BSC/MEDQ  D:  11/21/2006   T:  11/21/2006  Job:  [id number]

## 2010-07-06 NOTE — Op Note (Signed)
NAMEJHANAE, JASKOWIAK NO.:  [id number]   MEDICAL RECORD NO.:  [id number]          PATIENT TYPE:  INP   LOCATION:  2807                         FACILITY:  [facility]   PHYSICIAN:  Doylene Canning. Ladona Ridgel, MD    DATE OF BIRTH:  11/16/1933   DATE OF PROCEDURE:  02/01/2007  DATE OF DISCHARGE:                               OPERATIVE REPORT   PROCEDURE PERFORMED:  Implantation of a single chamber defibrillator.   INDICATIONS:  Ischemic cardiomyopathy, class II heart failure, EF of 25-  30% __________ .   The patient is a very pleasant elderly woman with a history of coronary  artery disease status post bypass surgery.  Also with history of  ischemic cardiomyopathy.  She has cerebrovascular disease as well as  well as peripheral vascular disease.  She is now referred for ICD  implantation.   PROCEDURE:  After informed obtained was obtained the patient was taken  to the diagnostic EP lab in the fasting state.  After the usual  preparation and draping, intravenous fentanyl and Midazolam was given  for sedation.  Thirty mL of lidocaine was infiltrated in the left  infraclavicular region.  A 7 cm incision was carried out over this  region.  Electrocautery was utilized to dissect down to the fascial  plane.  The left subclavian vein was punctured and a St. Jude model 7121  65-cm active fixation defibrillation lead serial number [id number] was  advanced by way of the subclavian vein into the right ventricle.  Mapping was carried out. At the final site the R-waves measured 7.5 mV  and the pacing threshold was 0.6 volts at 0.5 milliseconds.  The pacing  impedance 695 ohms.  Ten volt pacing did not stimulate the diaphragm.   With these satisfactory parameters the lead was secured to subpectoralis  fascia with a figure-of-eight silk suture.  The sewing sleeve was also  secured with silk suture.  Electrocautery was utilized to make a  subcutaneous pocket.  Kanamycin irrigation was utilized  to irrigate the  pocket.  Electrocautery utilized to assure hemostasis.  The St. Jude  current VR RF single chamber defibrillator model [id number], serial number  [id number] was connected to the defibrillation lead and placed back in the  subcutaneous pocket.  The generator was secured with a silk suture.  Kanamycin irrigation was utilized to irrigate the pocket and  defibrillation threshold testing carried out.   After the patient was more deeply sedated with fentanyl and Versed, VF  was induced with T-wave shock and a 15 joule shock was delivered  terminating VF and restoring sinus rhythm.  At this point no additional  defibrillation threshold testing was carried out and the incision closed  with a layer of 2-0 Vicryl followed by layer of 3-0 Vicryl.  Benzoin was  painted on the skin, Steri-Strips were applied and a pressure dressing  was placed.  The patient was returned to her room in satisfactory  condition.   COMPLICATIONS:  There were no immediate procedure complications.   RESULTS:  This demonstrates successful implantation of a St. Jude single  chamber  defibrillator in a patient with an ischemic cardiomyopathy and  congestive heart failure, EF 25-30%.      Doylene Canning. Ladona Ridgel, MD  Electronically Signed     GWT/MEDQ  D:  02/01/2007  T:  02/01/2007  Job:  [id number]   cc:   Arturo Morton. Riley Kill, MD, [facility]  [name], MD

## 2010-07-06 NOTE — Consult Note (Signed)
[name] [name], [name] [name] NO.:  [id number]   MEDICAL RECORD NO.:  [id number]          PATIENT TYPE:  INP   LOCATION:  3015                         FACILITY:  [facility]   PHYSICIAN:  Bevelyn Buckles. Bensimhon, MDDATE OF BIRTH:  1932/04/22   DATE OF CONSULTATION:  03/20/2007  DATE OF DISCHARGE:                                 CONSULTATION   PRIMARY CARDIOLOGIST:  Arturo Morton. Riley Kill, MD, [facility].   REASON FOR CONSULTATION:  Elevated troponin.   HISTORY OF PRESENT ILLNESS:  [name] [name] is a very pleasant 77 year old  woman with multiple medical problems, including diabetes, hypertension,  hyperlipidemia.  She also has a history of coronary artery disease and  underwent bypass surgery in June, 2002.  She has also ischemic  cardiomyopathy with an ejection fraction of 30-35%, and she is status  post defibrillator.  Her last cardiac catheterization was in December,  2008, which showed three vessel native coronary artery disease but all  grafts patent.  She was admitted to the hospital yesterday with  progressive right-sided weakness and gait instability.  CT of the head  showed multiple old infarcts and a question of right subacute pontine  infarct.  She was seen by Dr. Sharene Skeans from neurology, who felt that her  pontine infarct was not subacute and slightly older.  She does have a  history of a right carotid stenosis and underwent angioplasty in  October, 2008.  She had a relook angiogram last week which showed 95%  end-stent restenosis, and she is pending possible repeat angioplasty.   We have called the consult, because as part of her workup, she had a  cardiac panel drawn which showed an elevated troponin at 0.53 with  negative MB's.   She denies any chest pain.  No shortness of breath, although her  functional capacity has been limited by her gait instability.  EKG  showed nonspecific abnormalities, which were unchanged from previous.   REVIEW OF SYSTEMS:  Notable for weakness  and gait instability.  She also  had minimal lower extremity edema and some nocturia, also as well,  arthritis.  Review of systems is negative except for HPI and problem  list.   PAST MEDICAL HISTORY:  1. Coronary artery disease, status post bypass surgery in 2002.      a.     Last catheterization in September, 2008.  EF 30-35% with       three vessel coronary disease with patent graft.  2. Congestive heart failure secondary to ischemic cardiomyopathy, as      above.  3. Diabetes.  4. Hypertension.  5. Hyperlipidemia.  6. History of CVAs.  7. Right carotid stenosis, status post stenting on November 20, 2006,      complicated by end-stent restenosis.   CURRENT MEDICATIONS:  1. Amaryl.  2. Plavix 75 a day.  3. Metoprolol 100 a day.  4. Altace 5 daily.  5. Imdur 15 a day.  6. Potassium 20 a day.  7. Lipitor 20 a day.  8. Aspirin 325 a day.  9. Lasix 40 a day.  10.Sublingual nitroglycerin p.r.n.  She is apparently allergic to IV CONTRAST.   SOCIAL HISTORY:  She lives in [place] with her husband.  She is  retired.  She used to work in [employer].  No tobacco or  alcohol.   FAMILY HISTORY:  Mother died at age 66 due to breast cancer.  Father  died at 87 due to TB.   PHYSICAL EXAMINATION:  Elderly woman in no acute distress.  She is  sitting in bed.  Respirations are unlabored.  Blood pressure is 136/70.  Heart rate is 73.  She is satting at 97% on  room air.  She is afebrile.  HEENT:  Notable for a questionable right facial droop.  Otherwise  normal.  NECK:  Supple.  There is no JVD.  Carotids are 2+ bilaterally.  I do not  hear any bruits.  There is no lymphadenopathy or thyromegaly.  CARDIAC:  PMI is slightly laterally displaced.  She has a regular rate  and rhythm with no murmurs, rubs or gallops.  LUNGS:  Clear.  ABDOMEN:  Obese, nontender, nondistended.  There is no  hepatosplenomegaly.  There are no bruits.  There are no masses.  EXTREMITIES:  Warm  with no clubbing, cyanosis or edema.  No rash.  NEURO:  She is alert and oriented x3.  As above, question of right  facial droop.  Also, positive for left pronator drift and some mild  lower extremity weakness.   LABS:  White count 12.7, hemoglobin 13.3, platelets 233.  Sodium 141,  potassium 3.6, BUN 15, creatinine 0.82.  CK is 28 with an MB of 3.3 and  troponin of 0.53.  Follow-up CK is 330 with an MB of 2.9 and a troponin  of 0.4.   EKG shows normal sinus rhythm at a rate of 77 with left atrial  enlargement and possible previous inferior infarct.  Nonspecific ST-T  wave abnormalities, which are unchanged from previous.   ASSESSMENT/PLAN:  [name] [name] mild troponin elevation is nonspecific.  She does not have any cardiac symptoms at this time, which will make me  concerned for ischemia.  She has had a recent cardiac catheterization  which was quite reassuring.  I do wonder whether her troponin elevation  may be secondary to her neurologic issues.  At this time, I just  recommend continuing current therapy.  Please do not hesitate to call us  with any questions should the need arise.  We appreciate the consult.      Bevelyn Buckles. Bensimhon, MD  Electronically Signed     DRB/MEDQ  D:  03/20/2007  T:  03/20/2007  Job:  [id number]

## 2010-07-06 NOTE — H&P (Signed)
[name] [name], [name] [name]                ACCOUNT NO.:  [id number]   MEDICAL RECORD NO.:  [id number]          PATIENT TYPE:  OIB   LOCATION:  3102                         FACILITY:  [facility]   PHYSICIAN:  Sanjeev K. [name], M.D.DATE OF BIRTH:  1932/09/10   DATE OF ADMISSION:  01/29/2008  DATE OF DISCHARGE:                              HISTORY & PHYSICAL   CHIEF COMPLAINT:  Cerebrovascular disease.   HISTORY OF PRESENT ILLNESS:  This is a very pleasant 77 year old female  well known to Dr. [name] through a previous referral from Dr. [name] and Dr. Pearlean Brownie.  The patient has a history of cerebrovascular  disease with a previous CVA on November 20, 2006.  On November 20, 2006, the patient underwent stent-assisted angioplasty of a severe right  internal carotid artery stenosis.  The patient did well with the  procedure.  Unfortunately, she was readmitted to [facility] in  February 2009 with a possible new CVA.  She was found to have a stenosis  within the previously placed stent.  She underwent angioplasty of this  stenosis on April 03, 2007.  Since that time, she has been maintained  on aspirin and Plavix.   The patient had a followup of cerebral angiogram on December 27, 2007,  that showed a 90-95% stenosis within the previously placed right  internal carotid artery stent at the petrous-cavernous junction.  The  patient had been asymptomatic; however, due to the progression of the  stenosis, a decision was made to have the patient return to [facility] today for a repeat angioplasty and possible further stenting.   PAST MEDICAL HISTORY:  The patient has a history of cerebrovascular  disease with the above-mentioned previous CVA.  She has diabetes  mellitus.  She has a history of hypertension and hyperlipidemia.  She  has congestive heart failure with an ejection fraction estimated to be  30-35% in the past.  She has coronary artery disease and has had  previous coronary artery bypass graft surgery.  She is followed by Dr.  Riley Kill as her cardiologist.  She has a St. Jude implantable  defibrillator, which is followed by Dr. Sharrell Ku.  The patient also  has a history of mild anemia.  She had a vagal reaction following one of  her interventions in the past.   SURGICAL HISTORY:  Significant for coronary artery bypass graft surgery  in 2002.  She has also had a tonsillectomy, and as noted, she had a  defibrillator implanted.  She denies any previous problems with  anesthesia.   ALLERGIES:  The patient is allergic to CONTRAST DYE, which causes  headaches as well as nausea and vomiting.   Medications include metformin, glyburide, metoprolol, Lasix, K-Dur,  ramipril, Nitro-Dur, isosorbide, Lipitor, Plavix, aspirin, Xanax, and  fish oil.  Because of her contrast dye allergy, she was placed on a 13-  hour prednisone protocol.  She would also receive Benadryl and Pepcid  prior to her intervention.  She was told to continue all of her  medications on the day of the procedure except for  her diabetic  medicines.  Apparently, she did not take her Lasix and she was given 20  mg IV Lasix in the OR holding area.  She also received nimodipine and  Ancef as per Dr. Fatima Sanger protocol.   SOCIAL HISTORY:  The patient is married.  She lives in [place] with  her husband.  She has 6 healthy supportive children.  The patient has  never been a smoker.  She does not use alcohol.  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   FAMILY HISTORY:  Her mother died from breast cancer.  Her father died  from tuberculosis.   Review of systems is completely negative except for recent constipation.  The patient also reports that she bruises easily.   PHYSICAL EXAMINATION:  GENERAL:  Very pleasant 77 year old white female  in no acute distress.  VITAL SIGNS:  Blood pressure 186/84, pulse 76, respirations 18,  temperature 98, saturation 97% on room air.   HEENT:  Unremarkable.  NECK:  No bruits.  HEART:  Regular rate and rhythm without murmur.  ABDOMEN:  Nontender.  LUNGS:  Clear.  EXTREMITIES:  A 2 to 3+ pitting edema bilaterally.  The left lower  extremity was larger than the right.  The patient states that this is  her norm.  Her pulses are weak but intact.  Her airway was rated at 3.  Her ASA scale was rated at 4.  NEUROLOGICAL:  The patient to be alert and oriented.  Cranial nerves II  through XII are grossly intact.  Sensation was intact to light touch.  Motor strength was 5/5 throughout.  Cerebellar testing was intact.   LABORATORY DATA:  An INR was 1.0, PTT was 22, BUN was 17, creatinine  0.84, glucose was elevated at 322, potassium was 4.2.  Hemoglobin was  11.5, hematocrit 33.6, WBCs 11.1 thousand, platelets 205,000.   IMPRESSION:  1. History of cerebrovascular disease with a previous right      cerebrovascular accident in 2008.  2. Right internal carotid artery stent for severe stenosis, initially      placed on November 20, 2006, by Dr. [name].  3. Previous angioplasty of a stenosis within the right internal      carotid artery stent performed on April 03, 2007.  4. Cerebral angiogram performed on December 27, 2007, revealing a 90-      95% stenosis within the right internal carotid artery stent.  5. History of contrast dye allergy.  6. History of coronary artery disease with a previous myocardial      infarction.  7. History of coronary artery bypass graft surgery.  8. Left ventricular dysfunction with an ejection fraction estimated to      be 30-35%.  9. Status post St. Jude implantable defibrillator followed by Dr. Sharrell Ku.  10.History of a vagal reaction following angioplasty of the right      internal carotid artery stent.  11.Hyperlipidemia.  12.History of vertigo.  13.History of congestive heart failure.  14.Diabetes mellitus.  15.History of mild anemia.   PLAN:  As noted, the patient returns  today for further evaluation and  possible treatment of a stenosis within a previously placed right  internal carotid artery stent.  She does have a defibrillator.  The  [facility] placed a note on the chart stating that the  defibrillator would not need to be turned off if the catheters would not  be entering the right ventricle and if a  Bovie for coagulation was not  to be used during the procedure.  Therefore, a decision was made not to  disable the defibrillator.   The patient was also seen by her cardiologist, Dr. [name] on  January 24, 2008.  He is aware of the planned angioplasty for the right  internal carotid artery stent and there were no contrary  recommendations.      Delton See, P.A.    ______________________________  [name]. [name], [name]    DR/MEDQ  D:  01/29/2008  T:  01/29/2008  Job:  [id number]   cc:   [name], MD  Arturo Morton. Riley Kill, MD, [facility]  Pramod P. Pearlean Brownie, MD

## 2010-07-06 NOTE — Discharge Summary (Signed)
NAMETAKERA, RAYL NO.:  [id number]   MEDICAL RECORD NO.:  [id number]          PATIENT TYPE:  INP   LOCATION:  4714                         FACILITY:  [facility]   PHYSICIAN:  Doylene Canning. Ladona Ridgel, MD    DATE OF BIRTH:  11/16/1933   DATE OF ADMISSION:  02/01/2007  DATE OF DISCHARGE:  02/02/2007                               DISCHARGE SUMMARY   The patient has no drug allergies but may be a question of ALLERGINE  allergy.  Greater than 35 minutes for this dictation.   FINAL DIAGNOSIS:  Discharging day #1 status post implant of St. Jude  single chamber cardioverter-defibrillator with defibrillator threshold  study less than or equal to 15 joules.   SECONDARY DIAGNOSES:  1. History of myocardial infarction 2001 with subsequent coronary      artery bypass graft surgery.  The patient had stenting prior to her      myocardial infarction.  2. Left heart catheterization September 2008 for exertional angina.      All grafts were patent.  Ejection fraction 30-35%.  3. Extracranial cerebrovascular occlusive disease status post stent      placement in the right internal carotid artery, a very high      placement near the base of the skull.  4. Diabetes.  5. New York Heart Association class II-III chronic systolic congestive      heart failure.  6. Hypertension.  7. Dyslipidemia.   PROCEDURE:  February 01, 2007:  Implant St. Jude Current VR RF  cardioverter-defibrillator; defibrillator threshold study less than or  equal to 15 joules; Dr. Lewayne Bunting.  The patient had mild edema on  chest x-ray and was given IV Lasix prior to discharge postprocedure day  #1.  Her potassium on admission was 3.3 and her potassium was  replenished this admission.  Chest x-ray shows appropriate position of  her single lead.  Also shows prior coronary artery bypass graft surgery  and dilated cardiomyopathy.  The device has been interrogated and all  values were within normal limits.  The patient  is given a prescription  for Darvocet to help with the soreness at the incision site.   BRIEF HISTORY:  Ms. Bruster is a 77 year old female who has a  longstanding history of ischemic cardiomyopathy and class II congestive  heart failure.  Her past medical history includes diabetes, peripheral  vascular disease, extracranial cerebrovascular occlusive disease.  She  has had a prior myocardial infarction.  She has had prior stenting and  subsequent to her myocardial infarction a coronary artery bypass graft  surgery.  She had recent catheterization in September 2008.  Ejection  fraction was 30%.   The patient denies any history of syncope.  She does have occasional  dizzy spells.  She denies symptomatic arrhythmias.  She has a history  also of hypertension and dyslipidemia.  The patient qualifies for  prophylactic ICD insertion (MADIT II).  Risks and benefits have been  described to the patient and she wishes to proceed.   HOSPITAL COURSE:  The patient presented electively on December 11.  She  underwent implantation of the single-chamber cardioverter-defibrillator  by Dr. Lewayne Bunting.  The patient, as mentioned above, had mild edema  afterward, was treated with IV Lasix x1 dose.  Also had potassium  replenishment for a potassium of 3.3 on admission.  The patient  discharges on her prior medications with the admonition to keep her  incision dry for the next 7 days and to sponge bathe until Thursday,  December 18.   MEDICATIONS AT DISCHARGE:  A new one:  1. Darvocet-N 100 one to two tablets every 4-6 hours as needed.  2. Enteric-coated aspirin 325 mg daily.  3. Lasix 40 mg daily.  4. Potassium chloride 10 mEq daily.  5. Altace 5 mg twice daily.  6. Glimepiride 4 mg daily.  7. Plavix 75 mg daily.  8. Metoprolol ER 100 mg daily.  9. Isosorbide mononitrate 30 mg tablets one-half tablet daily.   FOLLOWUP:  [facility], [address], [address] [employer] [address]:  1. ICD clinic Wednesday,  December 24 at 9 a.m.  2. She will see Dr. Ladona Ridgel Wednesday, May 09, 2007, a 11:40 a.m.   LABORATORY STUDIES:  Drawn December 9:  Sodium is 138, potassium 3.6,  chloride 100, carbonate 30, glucose 172, BUN is 15, creatinine is 1.  A  complete blood count on December 9:  White cells 10.8, hemoglobin 11.7,  hematocrit 34.7, platelets of 184.  Pro time on December 9 was 11.4, INR  1.9.  Serum electrolytes on December 11:  Sodium 139, potassium 3.3,  chloride 104, carbonate 29, BUN is 14, creatinine 1.02, glucose is 115.      Maple Mirza, PA      Doylene Canning. Ladona Ridgel, MD  Electronically Signed    GM/MEDQ  D:  02/02/2007  T:  02/03/2007  Job:  [id number]   cc:   Doylene Canning. Ladona Ridgel, MD  Arturo Morton. Riley Kill, MD, [facility]  [name], MD

## 2010-07-06 NOTE — Consult Note (Signed)
NAME:  [name] [name], [name] [name]                ACCOUNT NO.:  [id number]   MEDICAL RECORD NO.:  [id number]          PATIENT TYPE:  OUT   LOCATION:  XRAY                         FACILITY:  [facility]   PHYSICIAN:  Sanjeev K. [name], M.D.DATE OF BIRTH:  1932-06-12   DATE OF CONSULTATION:  08/03/2007  DATE OF DISCHARGE:                                 CONSULTATION   CHIEF COMPLAINT:  Cerebrovascular disease.   HISTORY OF PRESENT ILLNESS:  This is a very pleasant 77 year old female  well known to Dr. [name] through previous referral from Dr. [name] and Dr. Pearlean Brownie.  The patient has a history of cerebrovascular  disease with a previous CVA.  On November 20, 2006, the patient  underwent PTA stenting of a right internal carotid artery stenosis.  She  was readmitted to [facility] in February of this year with a  possible new stroke.  She was found to have an in-stent stenosis and  underwent PTA of the previously placed right internal carotid artery  stent on April 03, 2007.  The patient has been maintained on aspirin  and Plavix.  Since that time, she had a CT angiogram performed on August 02, 2007, for further evaluation of her vascular disease.  There was  concern for possible restenosis of the stent.  The CT angiogram was read  out as having a string sign in that area.  She also had a moderate  basilar artery stenosis.  Dr. [name] requested that the patient come  in for consultation today.  She is accompanied by her husband.   PAST MEDICAL HISTORY:  Significant for the above-noted cerebrovascular  disease with previous CVA, although she has very little residual from  her CVA.  She also has diabetes mellitus.  Her result CVA was a right  CVA in September 2008.  She has a history of hypertension,  hyperlipidemia, congestive heart failure with an ejection fraction of 30-  35%, coronary artery disease with previous coronary artery bypass graft  surgery, and a history of mild  anemia.   SURGICAL HISTORY:  Significant for tonsillectomy, coronary artery bypass  graft surgery, and an implanted defibrillator.   ALLERGIES:  The patient is allergic to CONTRAST DYE.   CURRENT MEDICATIONS:  The patient did not bring a list of her  medications today.  She is on aspirin and Plavix, however.   SOCIAL HISTORY:  The patient is married.  She lives in [place] with  her husband.  She has 6 children, all of whom are healthy.  The patient  has never been a smoker.  She does not use alcohol.  She is retired from  a Recruitment consultant.   FAMILY HISTORY:  Mother died from breast cancer.  Father died from  tuberculosis.   IMPRESSION AND PLAN:  As noted, this patient returns today to be seen in  follow up by Dr. [name] after a CT angiogram performed on August 02, 2007, suggested restenosis of the previously placed right internal  carotid artery stent.  The CT angiogram was initially read by another  radiologist in our  group.  Dr. [name] reviewed the images today with  the patient and her husband, to Dr. [name], it appeared that the  patient had good flow in the stented area, although he could not say if  there was significant stenosis within the stent due to artifact from the  stent.  He agreed with the moderate basilar artery stenosis.  The  patient is not aware of any TIA or stroke-like symptoms, although her  husband notes that she occasionally has mild difficulty with ambulation.   Once again Dr. [name] was not convinced that the stent had  restenosed.  He felt as long as the patient was asymptomatic, there was  no urgency to repeating a cerebral angiogram.  He recommended a follow  up cerebral angiogram in August of this year, but sooner if the patient  should develop new neurological symptoms.  The patient and her husband  are in agreement with this plan.  He did once again remind the patient,  the importance of taking her aspirin and Plavix.    Greater than 15 minutes was spent on this followup visit.      Delton See, P.A.    ______________________________  [name]. [name], [name]    DR/MEDQ  D:  08/03/2007  T:  08/04/2007  Job:  [id number]   cc:   Pramod P. Pearlean Brownie, MD  [name], MD  Arturo Morton. Riley Kill, MD, [facility]

## 2010-07-06 NOTE — Consult Note (Signed)
NAME:  [name] [name], [name] [name]                ACCOUNT NO.:  [id number]   MEDICAL RECORD NO.:  [id number]          PATIENT TYPE:  OUT   LOCATION:  XRAY                         FACILITY:  [facility]   PHYSICIAN:  Sanjeev K. [name], M.D.DATE OF BIRTH:  09/29/1932   DATE OF CONSULTATION:  04/19/2007  DATE OF DISCHARGE:                                 CONSULTATION   CHIEF COMPLAINT:  Status post PTA for in-stent stenosis performed  April 03, 2007.   HISTORY OF PRESENT ILLNESS:  This is a very pleasant 77 year old female  referred to Dr. [name] through the courtesy of Dr. Riley Kill and Dr.  Pearlean Brownie for a history of a right internal carotid artery stenosis.  The  patient underwent PTA stenting of the stenosis on November 20, 2006,  with excellent results.  She was recently admitted to [facility] January 26 with right-sided numbness.  There was a question of  a new CVA.  However, Dr. Sharene Skeans reviewed the MRI and felt that the CVA  was old.  The patient was, however, found to have stenosis within the  previously placed stent.  Arrangements were made to have the patient  return to [facility] on April 03, 2007, to undergo further  treatment of her right internal carotid artery stenosis.  At that time,  she had a PTA of the existing stent in the right internal carotid  artery.  Again, with excellent results.  Please see Dr. Fatima Sanger  dictated note for full details.  The patient returns now accompanied by  her husband to be seen in follow-up.   PAST MEDICAL HISTORY:  1. Diabetes mellitus.  2. Cerebrovascular disease with previous right CVA in September 2008      with very little residual deficits.  3. History of hypertension.  4. Hyperlipidemia.  5. Congestive heart failure with ejection fraction of 30-35%.  6. Coronary artery disease with previous coronary artery bypass graft      surgery.  7. History of mild anemia.   SURGICAL HISTORY:  1. Tonsillectomy.  2. Coronary  artery bypass graft surgery.  3. Implanted defibrillator.   ALLERGIES:  SHE IS ALERT TO CONTRAST DYE.   MEDICATIONS:  She is currently continuing on aspirin and Plavix.  Please  see previous dictations for a complete list of her medications.   SOCIAL HISTORY:  The patient is married.  She lives in [place] with  her husband.  She has six children who are all healthy.  The patient has  never been a smoker.  She does not use alcohol.  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   FAMILY HISTORY:  Mother died from breast cancer.  Father died from  tuberculosis.   IMPRESSION AND PLAN:  As noted, the patient returns today to be seen in  follow-up after undergoing a PTA on April 03, 2007, of a stent placed  in the right internal carotid artery.  The initial placement was  performed November 20, 2006.  As noted, the patient tolerated the  procedure well.  Please refer to Dr. Fatima Sanger dictated note for full  details.   The patient reports she is doing well at this time.  She denies any  headaches, any numbness or weakness of the extremities.  She has not had  any speech difficulties.  She reports no neurological symptoms  whatsoever at this time.   Dr. [name] has recommended that she remain on aspirin and Plavix  indefinitely.  He plans to have the patient undergo a CT angiogram of  the head with and without contrast on June 10 for further follow-up of  the stenosis.  He also may consider repeating an angiogram in 6-9  months.  The patient does have a history of contrast allergy and has  received a 13-hour prednisone protocol in the past prior to her  angiograms.  We are not able to do an MRI or MRA secondary to her  implantable defibrillator.   The patient also has a residual distal basilar stenosis that has not  required treatment.  The plan is to continue to monitor that situation  for now.  If the patient develops symptoms or if the stenosis  progresses, it may  need to be addressed at some point the future.   Dr. [name] recommended that the patient resume her normal activities.  She was told she would walk as tolerated for exercise if her  cardiologist, Dr. Riley Kill, is in agreement with this plan.   Greater than 15 minutes was spent on this follow-up visit.      Delton See, P.A.    ______________________________  [name]. [name], [name]    DR/MEDQ  D:  04/19/2007  T:  04/20/2007  Job:  [id number]   cc:   Arturo Morton. Riley Kill, MD, [facility]  Pramod P. Pearlean Brownie, MD  [name], MD

## 2010-07-06 NOTE — Letter (Signed)
March 01, 2007    [name] Ora, MD  [phone] [place], [place] [address]   RE:  [name], [name]  MRN:  [id number]  /  DOB:  11/16/1933   Dear Elita Quick:   I had the pleasure of seeing [name] [name] in the office today in  followup.  She and I discussed a recent hospitalization.  As you know,  she has been recently hospitalized with an acute event.  It was thought  to possibly be due to diverticulitis.  She had incidental gallstones.  She was also noted to be hypokalemic as well as mildly anemic.  Her  initial anemia was not that profound, with a hemoglobin of about 11.8  but it was 9.9 at discharge and MCV was 80.  Abdominal CT consistent  with porcelain gallbladder and there has been some discussion.  I am not  sure what the level of discussion was while she was in the hospital  regarding this.  There is also thought to be acute diverticulitis.  She  is much better and feeling quite well, although she historically has  underestimated her symptoms.   CURRENT MEDICATIONS:  1. Enteric-coated aspirin 325 mg daily.  2. Lasix 40 mg daily.  3. Lipitor 20 mg nightly.  4. Potassium 10 mEq 2 tablets daily.  5. Altace 5 mg daily.  6. Glimepiride 4 mg daily.  7. Plavix 75 mg daily.  8. Metoprolol ER 100 mg daily.  9. Isosorbide dinitrate 130 mg 1/2 tablet daily.  10.Cipro.  11.Flagyl.   PHYSICAL:  She is alert and oriented.  Blood pressure is 132/68, pulse 71.  Lung fields are clear.  CARDIAC:  Rhythm is regular and unchanged.   Her electrocardiogram demonstrates normal sinus rhythm with delayed R-  wave progression.  She has evidence of an old inferior wall infarction  as well.  There is nonspecific ST-T abnormalities.   At the present time we will go ahead and get a basic metabolic profile  on her.  I also plan to get a CBC.  She is scheduled to see you back in  followup next week and I got these so that you would have them when you  see her.  We plan to see her back in followup  in 6 weeks.  Certainly we  would defer to you with regard to whatever you think regarding her  gallbladder.  I appreciate the opportunity of sharing in her care.  We  will continue to follow her closely in the cardiology clinic.    Sincerely,      Arturo Morton. Riley Kill, MD, [facility]  Electronically Signed    TDS/MedQ  DD: 03/01/2007  DT: 03/01/2007  Job #: [id number]

## 2010-07-06 NOTE — Assessment & Plan Note (Signed)
[facility]                         ELECTROPHYSIOLOGY OFFICE NOTE   NAME:[name] [name], [name] [name]                       MRN:          [id number]  DATE:02/26/2008                            DOB:          [birth year]    [name] [name] returns today for followup.  She is a very pleasant elderly  woman with an ischemic cardiomyopathy.  She has cerebrovascular disease.  She has congestive heart failure which is class I to II.  She returns  today for followup.  She denies any chest pain or shortness of breath.  Her main complaint is that she has continued to have problems with stent  in her right carotid artery, which continues to have restenosis despite  recurrent angioplasties.  She had no specific symptoms today otherwise.  She states that she is minimally weak in her left arm, which has been  chronic.   CURRENT MEDICATIONS:  1. Aspirin 325 a day.  2. Lasix 40 a day.  3. Altace 5 twice a day.  4. Glyburide 4 a day.  5. Plavix 75 a day.  6. Isosorbide 15 mg daily.  7. Metformin 500 mg twice daily.  8. Metoprolol 50 twice daily.  9. Potassium 40 a day.  10.Lipitor 40 a day.  11.Fish oil supplements.   PHYSICAL EXAMINATION:  GENERAL:  She is a pleasant, well-appearing  elderly woman in no distress.  VITAL SIGNS:  Blood pressure was 130/70, the pulse 68 and regular, the  respirations were 18, the weight was 205 pounds.  NECK:  No jugular venous distention.  LUNGS:  Clear bilaterally to auscultation.  No wheezes, rales, or  rhonchi are present.  No increased work of breathing.  CARDIOVASCULAR:  Regular rate and rhythm.  Normal S1 and S2.  ABDOMEN:  Soft, nontender.  EXTREMITIES:  Demonstrated no edema.   The ICD insertion site was healed nicely.   Interrogation of defibrillator demonstrates a [employer] Jude current single-  chamber device.  The R waves were 9, the impedance 430, the threshold  0.5 at 0.5, the shock impedance was 47 ohms, the battery voltage was  3.2  volts.  There are no intercurrent IC therapies.  Estimated longevity is  about 8 years.   IMPRESSION:  1. Ischemic cardiomyopathy.  2. Congestive heart failure.  3. Status post implantable cardioverter-defibrillator insertion.  4. Cerebrovascular disease.   DISCUSSION:  Ms. Daggs is stable.  At the present time, her  defibrillator is working normally.  We will see the patient back for ICD  followup in 1 year.     Doylene Canning. Ladona Ridgel, MD  Electronically Signed    GWT/MedQ  DD: 02/26/2008  DT: 02/27/2008  Job #: [id number]

## 2010-07-06 NOTE — Assessment & Plan Note (Signed)
[facility]                            CARDIOLOGY OFFICE NOTE   NAME:OLIVERLorenia, Hoston                       MRN:          [id number]  DATE:10/09/2006                            DOB:          11/16/1933    Ms. Remsen is in for a followup visit.  In general, she is doing  reasonably well.  She resumed her medications.  She denies any chest  pain.   CURRENT MEDICATIONS:  1. Enteric-coated aspirin 325 mg daily.  2. Metoprolol 50 mg 1/2 tablet b.i.d.  3. Lasix 40 mg daily.  4. Lipitor 20 mg nightly.  5. Potassium 10 mEq daily.  6. Altace 5 mg 1 b.i.d.  7. Glimepiride 4 mg daily.   She is scheduled to be seen in primary care on September 5.   EXAM:  Blood pressure 160/86, pulse is 75.  LUNGS:  The lung fields are clear.  CARDIAC:  Rhythm is regular.   Her recent sugars have been down somewhat.   Currently, we will go ahead and increase her metoprolol to 50 b.i.d.  I  will have her return to the clinic and see me in followup.  She will be  seen in primary care.  We will have her come in for lipid profile and we  will have her seen in primary care.  Hopefully, the increased dose of  metoprolol will be tolerated to improve her blood pressure.     Arturo Morton. Riley Kill, MD, [facility]  Electronically Signed    TDS/MedQ  DD: 10/09/2006  DT: 10/10/2006  Job #: [id number]

## 2010-07-06 NOTE — H&P (Signed)
NAMECARIANNA, [name] [name] NO.:  [id number]   MEDICAL RECORD NO.:  [id number]          PATIENT TYPE:  INP   LOCATION:  1826                         FACILITY:  [facility]   PHYSICIAN:  [name], M.D.DATE OF BIRTH:  11/16/1933   DATE OF ADMISSION:  03/19/2007  DATE OF DISCHARGE:                              HISTORY & PHYSICAL   PRIMARY CARE PHYSICIAN:  [name], MD   CARDIOLOGIST:  Dr. [name].   INTERVENTIONAL RADIOLOGIST:  Dr. [name]. [name], [name]   CHIEF COMPLAINT:  Right-sided numbness.   HISTORY OF PRESENT ILLNESS:  The patient is a 77 year old white female,  a past medical history of CAD, status post CABG with a defibrillator  placed in December of 2008, as well as a history of carotid artery  disease, with severe stenosis of the right internal carotid artery,  status post a stent placement earlier on this year.  The patient  presents with right-sided weakness.  Specifically, she has been followed  by Dr. [name] for her right near-occluded artery status post stent.  He had a follow-up cerebro angiogramm done a few days ago, which had  showed interval development of severe focal stenoses within the  previously stented portion of the right ICA and cavernouspetrousss  junction.  The patient had been planned for.  This was done, because the  patient's family had reported that the patient was having some symptoms  and complaints of weakness or so.  Then, over the last several days,  although the patient can not clearly communicate this well enough to  family, but she was having more and more problems with right-sided  weakness and trouble walking.  Initially, they had thought that this was  intermittent, but according to the patient it had been continuous.  They  brought the patient in today to the emergency room.  She underwent a CT  of the head without contrast, which showed previously unchanged right  periventricular scatter infarcts, but  now a new finding was a right  pontine lacunar infarct, not definitively seen on prior MRI done in  September 20008.  The radiologist felt this area may represent subacute  lacunar infarct, no evidence of hemorrhage was noted.  With these  findings and the history of right-sided weakness that had gone on for  the last couple of days, it was suspected the patient had a new subacute  CVA.  When the patient came in, her blood pressure was noted to be in  the 180s to 190s.  She says rather than weakness, it is a bit more  numbness seems to be more the dominating symptom.  She also notes some  occasional episodes of numbness on either side of her face, alternating,  but her main complaint is more of the arm, hand, leg and somewhat of the  foot on the right side.  The patient is otherwise doing well.  She  denies any headaches, vision changes, dysphagia, chest pain,  palpitations, shortness of breath, wheezing or coughing.  No abdominal  pain, no hematuria, or dysuria, constipation, diarrhea.  No focal  extremity numbness, weakness or pain, other than described above.   REVIEW OF SYSTEMS:  Otherwise negative.   PAST MEDICAL HISTORY:  Includes a history of CAD status post CABG,  history of defibrillator placed December 2008, history of severe right-  sided internal carotid artery stenosis, status post stent placement with  restenoses, history of previous CVA seen by CT.  History of  diverticulitis, history of porcelain gallbladder, type 2 diabetes,  hyperlipidemia and CHF with an EF of 25% to 30%.Marland Kitchen0   MEDICATIONS:  She is on:  1. Ramipril 5.  2. Imdur 15 mg p.o. daily.  3. K-Dur 20.  4. Lipitor 20 q.h.s.  5. Metoprolol 100.  6. Plavix 75.  7. Amaryl 4.  8. Aspirin 325.   ALLERGIES:  SHE HAS ALLERGIES TO IV DYE.   SOCIAL HISTORY:  No tobacco, alcohol, or drug use.   FAMILY HISTORY:  Noncontributory.   PHYSICAL EXAMINATION:  VITAL SIGNS:  On admission, temperature 97.3,  heart rate  80, blood pressure 180/88, respirations 18, O2 sats 94% on  room air.  GENERAL:  She is alert and oriented x3, no apparent distress.  HEENT:  Normocephalic, atraumatic.  Mucous membranes are moist.  NECK:  She had no carotid bruits.  HEART:  Regular rate and rhythm, S1, S2, 2/6 systolic ejection murmur.  LUNGS:  Clear to auscultation bilaterally.  ABDOMEN:  Soft, nontender, nondistended, positive bowel sounds.  EXTREMITIES:  Show no clubbing, cyanosis or edema.  NEUROLOGICAL:  She has no carotid bruits.  Her cranial nerves 2-12  appear to be intact.  What appears to be initial slight right facial  droop, and says she has no evidence of a droop when she has a full  smile.  MUSCULOSKELETAL:  Exams shows her strength is a 5/5 with good flexion  and extension of both extremities, as well as flexion and extension of  the leg and knee appear to be intact.  No evidence of any Babinski sign.  No evidence of any dysmetria, and other coordination issues appear to be  intact.  The rest of her problems appear to be more of sensory,  subjective in nature.   LABORATORY WORK:  CT scan is as per HPI.  White count 12.7, H&H 13 and  40, MCV of 82, platelet count 253, no shift; sodium 141, potassium 3.6,  chloride of 100, bicarb 29, BUN 15, creatinine 0.8, glucose 169, LFTs  are unremarkable, UA unremarkable, lipase normal.  CPK is 79, MB  2.2troponin-I 0.4,   ASSESSMENT/PLAN:  1. Subacute cerebrovascular accident with right-sided numbness.  The      patient has a previous history of right-sided carotid artery      stenosis with stent and a recent cerebral angiogram.  However, I      discussed this, as it appears to be more of a left-sided brain      issue.  We are not able to get MRI secondary to defibrillator.      Will discuss with Dr. [name] of interventional radiology, as      well.  At this time, will see if a further angiogram is indicated      for the left side of the brain, stent placement  or so does not      appear to be scattered.  This appears to be less of a cardiac clot      issue, as appears to be a focal lesion versus scattered lesions.      In the  meantime, will cover with Lovenox and slowly work up blood      pressure control.  2. Hypertension.  See above.  Will be leary of decreasing her blood      pressure too greatly.  Will also check her fasting lipid profile.  3. Diabetes mellitus, sliding scale.  She will resume diet, once she      is clearedd for swallowing evaluation.  4. Elevated troponin.  The patient with chronic history.  No evidence      of any chest pain, no cardiac issues.  I did      not comment on her EKG, which reads as a normal sinus rhythm,      possible left atrial enlargement.  No evidence of any acute ST or T-      wave changes.  Will plan to re-check labs in the morning.  5. History of porcelain gallbladder and diverticulitis, stable.      [name], [name]  Electronically Signed     SKK/MEDQ  D:  03/19/2007  T:  03/19/2007  Job:  [id number]

## 2010-07-06 NOTE — H&P (Signed)
NAMEMILKA, WINDHOLZ                ACCOUNT NO.:  [id number]   MEDICAL RECORD NO.:  [id number]          PATIENT TYPE:  AMB   LOCATION:  SDS                          FACILITY:  [facility]   PHYSICIAN:  Sanjeev K. [name], M.D.DATE OF BIRTH:  Nov 13, 1932   DATE OF ADMISSION:  04/03/2007  DATE OF DISCHARGE:                              HISTORY & PHYSICAL   CHIEF COMPLAINT:  Cerebrovascular disease.   HISTORY OF PRESENT ILLNESS:  This is a pleasant 77 year old female  initially referred to Dr. [name] through the courtesy of Dr. Riley Kill  and Dr. Pearlean Brownie.  Patient had been admitted to [facility] in  September, 2008 with a right CVA, and MRI/MRA at that time showed a high  grade focal stenosis of the right internal carotid artery.  There was  also a high-grade stenosis of the basilar artery.  The patient had a  cerebral angiogram performed on November 09, 2006 that revealed a 95%  stenosis of the right internal carotid artery as well as a 65-70%  stenosis of the distal basilar and mid basilar junction arteries.  On  November 20, 2006, patient underwent PTA stenting of the right internal  carotid artery, performed under general anesthesia by Dr. [name]  without complications.   The patient has been followed since that time.  There was some concern  that she had developed an end-stent stenosis.  The last cerebral  angiogram was performed on January 23.  This revealed an interval  development of a severe focal stenosis within a previously stented  portion of the right internal carotid artery.  Continued aspirin and  Plavix therapy was recommended with possible repeat intervention.   On March 19, 2007, the patient was readmitted to [facility]  with right-sided numbness.  There was a question of a subacute infarct  in the right pontine lacunar area.  A neurology consult was obtained  from Dr. Sharene Skeans.  He felt that this infarct was old.   During her stay, a CT perfusion  scan was performed on March 23, 2007.  This was consistent with severe stenosis of the right internal carotid  artery at the petrous cavernous junction of the stented segment.   DrCorliss Skains re-evaluated the patient while in the hospital.  Arrangements were made to discharge the patient on March 24, 2007 to  return today, April 03, 2007, for further intervention of the right  internal carotid artery stenosis.   PAST MEDICAL HISTORY:  1. Diabetes mellitus.  2. Hypertension.  3. Hyperlipidemia.  4. Congestive heart failure with an ejection fraction of 30-35%.  5. History of mild anemia.  6. She has had previous coronary artery bypass graft surgery.  7. She had a right CVA on November 07, 2006 with very little residual      deficits.   PAST SURGICAL HISTORY:  1. Tonsillectomy.  2. Coronary artery bypass graft surgery.  She denies previous problems      with anesthesia; however, she has had some problems with vagal      reactions following the removal of her femoral groin sheaths.  ALLERGIES:  Patient is allergic to CONTRAST DYE.  She did receive a 13-  hour prednisone protocol prior to admission.   CURRENT MEDICATIONS:  1. Aspirin 325 mg daily.  2. Plavix 75 mg daily.  3. Ramipril 5 mg once daily.  4. Metoprolol XL 100 mg daily.  5. Glimepiride 4 mg daily.  6. Potassium chloride 20 mEq daily.  7. Lipitor 20 mg daily.  8. Imdur 15 mg daily.  9. Apparently, she is also on Metformin.   SOCIAL HISTORY:  Patient is married.  She lives in [place] with her  husband.  She has six children who are all healthy.  The patient has  never been a smoker.  She does not use alcohol.  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   FAMILY HISTORY:  Her mother died from breast cancer.  Her father died  from tuberculosis.   REVIEW OF SYSTEMS:  Completely negative except for some recent  constipation.  As noted, she has diabetes mellitus.  She also tells me  that she was told  she has diverticular disease and cholecystitis.  She  may need a cholecystectomy at some point.   LABORATORY DATA:  An INR was 0.9 and PTT was 25.  Hemoglobin 12.6,  hematocrit 37.3, WBCs 15,000, platelets 209,000.  BUN is 17, creatinine  1.  GFR was 55.  Glucose was 371 on arrival to the hospital.  Potassium  was 4.2.  The elevated glucose levels and the elevated white blood cell  count was felt secondary to prednisone.   PHYSICAL EXAMINATION:  A very pleasant 77 year old white female in no  acute distress.  VITAL SIGNS:  Blood pressure 142/73, pulse 64, respirations 20,  temperature 97.  Oxygen saturation 97%.  HEENT:  Unremarkable.  NECK:  No bruits.  HEART:  Regular rate and rhythm without murmur.  LUNGS:  Clear anteriorly.  ABDOMEN:  Nontender.  Extremities revealed pulses to be weak to absent.  Her ankles appeared  edematous, but there was no pitting.  NEUROLOGIC:  Mental status:  Patient was alert and oriented, follows  commands.  Cranial nerves II-XII were grossly intact.  Sensation was  intact to light touch.  Motor strength is 5/5.  Cerebellar testing was  intact.   IMPRESSION:  1. Cerebrovascular disease with previous right pontine lacunar      infarct.  2. History of high grade stenosis of the right internal carotid      artery, status post percutaneous transluminal arterial      angioplasty/stenting performed on November 20, 2006.  3. History of CONTRAST DYE allergy, treated with prednisone, Benadryl,      and Pepcid.  4. History of vagal reactions.  5. Diabetes mellitus.  6. Hypertension.  7. Hyperlipidemia.  8. Congestive heart failure.  9. Ejection fraction of 30-35%.  10.History of anemia.  11.Status post coronary artery bypass graft surgery.  12.History of cholecystitis.  13.History of diverticular disease.  14.History of coronary artery disease with previous myocardial      infarction.  15.Implanted defibrillator.   PLAN:  As noted, the patient has  been brought back to the hospital for  repeat cerebral angiogram and possible PTA stenting of an end-stent  stenosis of the right internal carotid artery.      Delton See, P.A.    ______________________________  [name]. [name], [name]    DR/MEDQ  D:  04/03/2007  T:  04/03/2007  Job:  [id number]   cc:   Pramod P.  Pearlean Brownie, MD  Arturo Morton. Riley Kill, MD, [facility]  [name], MD

## 2010-07-06 NOTE — H&P (Signed)
[name] [name], KIERSTEAD NO.:  [id number]   MEDICAL RECORD NO.:  [id number]          PATIENT TYPE:  OIB   LOCATION:  2899                         FACILITY:  [facility]   PHYSICIAN:  Arturo Morton. Riley Kill, MD, FACCDATE OF BIRTH:  11/16/1933   DATE OF ADMISSION:  11/07/2006  DATE OF DISCHARGE:                              HISTORY & PHYSICAL   CHIEF COMPLAINT:  Chest tightness.   HISTORY OF PRESENT ILLNESS:  [name] [name] is a very pleasant lady who I have  followed previously.  She recently came in after stopping all of her  medicines.  They subsequently have been resumed, and she has been under  reasonable control.  Nonetheless, she has had some recurrent episodes of  chest discomfort over the past few weeks which have reminded her of what  she had prior to her original bypass surgery.  Importantly, she has had  several episodes, and she feels inclined to go ahead with repeat cardiac  catheterization.  This was unprompted by our evaluation, and the patient  in general has been reluctant to undergo any type of medical care in the  past.  With this, repeat cardiac catheterization has been recommended,  and she has agreed to proceed.   PAST MEDICAL HISTORY:  1. History of aortocoronary bypass surgery on August 08, 2000 with a      history of left ventricular dysfunction and urgent CABG with an      internal mammary to the LAD, reversed saphenous vein graft to the      intermediate, reversed saphenous vein graft to the second OM and      reversed saphenous vein graft to the distal right coronary by      Sheliah Plane.  2. Non-insulin dependent diabetes mellitus.  3. History of diarrhea with hypokalemia secondary to Augmentin.  4. Hyperlipidemia.  5. Prior anterior wall infarction August 08, 2000.  6. History of episodic vertigo.   ALLERGIES:  HISTORY OF CONTRAST ALLERGY.   MEDICATIONS:  1. Enteric-coated aspirin 325 mg daily.  2. Metoprolol 50 mg 1/2 tablet b.i.d.  3. Lasix 40 mg  daily.  4. Lipitor 20 mg every night.  5. Potassium 10 mEq daily.  6. Altace 5 mg 1 b.i.d.  7. Glimepiride 4 mg daily.   FAMILY HISTORY:  Father died of TB.  Her mother died of breast cancer.  She did have a grandmother that had a myocardial infarction.   The patient is single.  Her daughter, Sheralyn Boatman, worked on the 2000 unit.  She is a nonsmoker.   REVIEW OF SYSTEMS:  She has not had blood loss in the stool or other GI  complaints.  She has had mild shortness of breath but mainly with  exertion.  She has been generally inactive.  She has not had rash.  She  has not had significant visual disturbance.  Review of systems is  otherwise unremarkable.   PHYSICAL EXAMINATION:  She is an alert and oriented female.  The weight  is 193 pounds, blood pressure 138/80, pulse of 63.  The lung fields are clear.  The cardiac  rhythm is regular with an S4 gallop.  No definite murmurs or  rubs.  ABDOMEN:  Is soft.  The extremities reveal no edema.   IMPRESSION:  1. Status post aortocoronary bypass graft surgery for ischemic      cardiomyopathy following anterior wall myocardial infarction.  2. Non-insulin-dependent diabetes mellitus, with history of past      medication compliance issues.  3. Hypercholesterolemia.  4. Non-insulin dependent diabetes mellitus.  5. Moderate obesity.   DISPOSITION:  Risks, benefits and alternatives have been discussed with  the patient in detail, and she consents to proceed.      Arturo Morton. Riley Kill, MD, [facility]  Electronically Signed     TDS/MEDQ  D:  11/07/2006  T:  11/07/2006  Job:  [id number]

## 2010-07-06 NOTE — Assessment & Plan Note (Signed)
[facility]                            CARDIOLOGY OFFICE NOTE   NAME:Hulick, SALEEN PEDEN                       MRN:          [id number]  DATE:12/13/2006                            DOB:          11/16/1933    Kharisma is in for a follow-up visit.  In general, she has been stable.  She does have a little bit of ankle edema.  This nice lady was admitted  and underwent cardiac catheterization.  She had been having some  symptoms recently.  At the time of cardiac catheterization she had an  occluded graft to the right with collateralization through the LIMA to  the LAD.  Her family described the symptoms of stroke-type symptoms  recently and so an MRI was done, which was abnormal.  A neurology  consult was obtained.  She was felt to have a high-grade stenosis of the  right internal carotid artery and she subsequently underwent  percutaneous stenting of the right internal carotid artery at the  petrous-cavernous junction with delayed hemodynamic flow.  She was noted  to have a 65-70% stenosis of the distal basilar junction as well.  This  was __________  by neurology.   She underwent successful stenting and has been getting along reasonably  well.   Since discharge from the hospital she has been stable.   CURRENT MEDICATIONS:  1. Enteric-coated aspirin 325 mg daily.  2. Lasix 40 mg daily.  3. Lipitor 20 mg q.h.s.  4. Potassium 10 mEq daily.  5. Altace 5 mg p.o. b.i.d.  6. Glimepiride 4 mg daily.  7. Plavix 75 mg daily.  8. Metoprolol ER 100 mg daily.  9. Isosorbide dinitrate 30 mg one-half tablet daily.   On physical today the blood pressure is 156/68, the pulse is 69.  The lung fields are clear.  The cardiac rhythm is regular.  The extremities reveal trace edema.   The electrocardiogram demonstrates normal sinus rhythm.  There are  occasional ventricular premature beats.  The QT interval is slightly  prolonged.   IMPRESSION:  1. Ischemic  cardiomyopathy.  2. Status post coronary artery bypass graft surgery with patent      internal mammary to the left anterior descending artery, patent      grafts with occluded graft to the right coronary artery and      collateralization of the distal vessel, with occlusion beyond the      graft insertion in the right coronary artery.   PLAN:  1. Given her ejection fraction of less than 40%, she will be referred      to the electrophysiologic clinic to see Dr. Ladona Ridgel or Dr. Graciela Husbands.  2. We will get a basic metabolic profile and a magnesium today.  3. I have encouraged her to follow up with Dr. Pearlean Brownie regarding her      cerebrovascular disease.  4. I am going to encourage her to continue to take her medicines,      which was a problem in the past.     Maisie Fus D. Riley Kill, MD, [facility]  Electronically Signed    TDS/MedQ  DD: 12/13/2006  DT: 12/14/2006  Job #: [id number]

## 2010-07-06 NOTE — Discharge Summary (Signed)
NAME:  [name] [name], [name] [name]                ACCOUNT NO.:  [id number]   MEDICAL RECORD NO.:  [id number]          PATIENT TYPE:  INP   LOCATION:  3706                         FACILITY:  [facility]   PHYSICIAN:  Valerie A. Felicity Coyer, MDDATE OF BIRTH:  11/16/1933   DATE OF ADMISSION:  02/20/2007  DATE OF DISCHARGE:  02/23/2007                               DISCHARGE SUMMARY   DISCHARGE DIAGNOSES:  1. Nausea and vomiting with abnormal LFTs, diverticulitis changes on      CT status post treatment for presumed diverticulitis with      improvement.  See details below.  2. Incidental porcelain gallbladder and gallstones.  Outpatient      surgical follow-up to be arranged.  3. Status post syncope in the emergency room at time of admission.  No      arrhythmias on tele.  No further episodes.  4. Chronic systolic heart failure due to ischemic cardiomyopathy with      LVEF 25-30%, status post ICD placement February 01, 2007.  Continue      med management.  5. Mild hypokalemia due to GI loss and diuretics, replaced and      resolved.  6. History of coronary disease status post coronary artery bypass      graft in 2001.  Continue medical management.  7. Type 2 diabetes.  Continue medical management.  A1c 6.8.  8. Hypertension.  Continue med management.  9. Dyslipidemia  10.Mild anemia, normocytic without acute blood loss.  Discharge      hemoglobin 9.9.   DISCHARGE MEDICATIONS:  1. Include Cipro 500 mg b.i.d. times seven additional days to complete      10-day course.  2. Flagyl 500 mg t.i.d. times seven additional days to complete a 10-      day course.   Other medications are as prior to admission and include:  1. Aspirin 325 mg once daily.  2. Lasix 40 mg once daily.  3. Potassium 10 mEq 2 tablets p.o. daily (this is an increased new      dose from previous 10 mg daily).  4. Lipitor 20 mg daily.  5. Altace 5 mg once daily.  6. Plavix 75 mg daily.  7. Toprol XL 100 mg daily.  8. Imdur 30 mg  one-half tablet daily.  9. Glipizide 4 mg once daily.   Hospital follow-up is scheduled with primary care physician, Dr. [name] for January 16 at 2:00 p.m.  She is instructed that if she has  recurrence of fever, abdominal pain, nausea, vomiting or other symptoms  after discharge prior to her follow-up she should contact her primary MD  or return to the emergency room for further evaluation.  She is  tolerating a regular diet, medications, nutrition and hydration without  difficulty prior to discharge and is felt stable for discharge home  having reached maximal hospital benefit.   HOSPITAL COURSE BY PROBLEM:  1. Presumed acute diverticulitis.  The patient is a 77 year old woman      who came to the emergency room due to nausea, vomiting with shaking  chills.  She was found on laboratory evaluation to have a white      count of 17 and mild increased LFTs.  CT of the abdomen and pelvis      was done, which showed signs consistent with sigmoid diverticulitis      as well as an incidental porcelain gallbladder.  She was admitted      for IV antibiotics including Cipro and Flagyl, IV fluids,      observation and rest.  Her nausea/vomiting symptoms quickly      resolved and her white count was further reduced after 48 hours of      antibiotics.  The patient did not experience abdominal pain and due      to the atypical symptoms of diverticulitis presenting in this      manner an abdominal ultrasound was checked to further evaluate the      status of the gallbladder.  This was felt to be likely porcelain      gallbladder versus a very large gallstone with a separate gallstone      within the neck of the gallbladder.  The patient has not had any      tenderness.  Her nausea, vomiting have resolved and follow-up LFTs      have normalized at this time.  The patient is very anxious for      discharge home.  As she is feeling well and tolerating a regular      diet.  We have spoken with  surgery and we will arrange for an      outpatient consultation follow-up after these acute issues of      diverticulitis have resolved.  Therefore follow-up with primary MD      on January 16 as noted with subsequent referral to surgery to be      arranged.  2. Other chronic medical issues.  The patient's other medical issues      are numerous and as listed above.  She was managed with sliding      scale insulin during this hospitalization with slight exacerbation      in her glycemic control due to D5 with variable diet status during      this acute illness.  She was gently hydrated but had no acute      exacerbation of her heart failure symptoms and had no further      cardiac problems this hospitalization.  There was a brief episode      of witnessed syncope in the emergency room but telemetry monitoring      and cardiac enzymes were unremarkable and this is likely due to      symptoms of number one above.       Valerie A. Felicity Coyer, MD  Electronically Signed     VAL/MEDQ  D:  02/23/2007  T:  02/23/2007  Job:  [id number]

## 2010-07-06 NOTE — Assessment & Plan Note (Signed)
[facility]                            CARDIOLOGY OFFICE NOTE   NAME:[name] [name], [name] [name]                       MRN:          [id number]  DATE:01/24/2008                            DOB:          1932/04/30    [name] [name] is in for a followup visit.  In general, she is doing reasonably  well.  She denies any ongoing chest pain.  She has not had progressive  shortness of breath.  Overall, she has seen Dr. Daphine Deutscher today.  He is  going to have a discussion with her about the portion of gallbladder  over the next 3 months.  In addition, he did drain a cyst on her back.  She says she is doing the best as she has done in quite some time.  She  denies any ongoing current symptomatology.   One additional item is the fact that she has had restenosis in her  intracranial stent.  This needs to be redilated and scheduled for next  Wednesday.  Preoperative laboratory studies are supposed to be done  tomorrow.   MEDICATIONS:  1. Enteric-coated aspirin 325 mg daily.  2. Lasix 40 mg daily.  3. Lipitor 20 mg nightly.  4. Altace 5 mg b.i.d.  5. Glimepiride 4 mg daily.  6. Plavix 75 mg daily.  7. Isosorbide mononitrate 30 mg one-half tablet daily.  8. Potassium chloride 20 mEq daily.  9. Metformin 500 mg p.o. b.i.d.  10.Metoprolol tartrate 50 mg p.o. b.i.d.   PHYSICAL EXAMINATION:  GENERAL:  She is very alert and oriented in no  distress.  VITAL SIGNS:  Her weight is up slightly at 205 pounds, her blood  pressure is 130/80, the pulse is 57.  The defibrillator site looks good.  LUNGS:  The lung fields are clear to auscultation and percussion.  CARDIAC:  There is an S4 gallop.  EXTREMITIES:  Do not reveal significant edema.   EKG reveals sinus bradycardia.  There is nonspecific ST-T wave  abnormalities.   LABORATORY DATA:  Lab work done in November did reveal a BUN of 22,  creatinine 1.12, potassium 4.2, hemoglobin was 12.0, hematocrit 36.7  with a platelet count  227,000.   IMPRESSION:  1. Coronary artery disease status post coronary artery bypass graft      surgery with known ischemic cardiomyopathy.  2. History of systolic congestive heart failure, chronic.  3. Status post implantable defibrillator insertion.  4. Intracranial cerebrovascular disease with restenosis for repeat      intervention next week.  5. Adult onset diabetes mellitus.  6. Hypertension.   PLAN:  1. Return to clinic in 3 months.  2. Blood work is going to be done tomorrow.  3. I have stressed the importance of dietary control and monitoring      her diabetes.     Arturo Morton. Riley Kill, MD, [facility]  Electronically Signed    TDS/MedQ  DD: 01/24/2008  DT: 01/25/2008  Job #: [id number]

## 2010-07-06 NOTE — Discharge Summary (Signed)
[name] [name], [name] [name]                ACCOUNT NO.:  [id number]   MEDICAL RECORD NO.:  [id number]          PATIENT TYPE:  INP   LOCATION:  3102                         FACILITY:  [facility]   PHYSICIAN:  Sanjeev K. [name], M.D.DATE OF BIRTH:  Nov 08, 1932   DATE OF ADMISSION:  01/29/2008  DATE OF DISCHARGE:  01/31/2008                               DISCHARGE SUMMARY   CHIEF COMPLAINT:  Cerebrovascular disease status post angioplasty of a  right internal carotid artery intrastent stenosis performed on January 29, 2008.   BRIEF HISTORY:  This is a pleasant 77 year old female well known to Dr.  [name] through a previous referral from Dr. [name] and Dr.  Pearlean Brownie.  The patient had a previous right CVA on November 07, 2006.  She  subsequently had a stent-assisted angioplasty of the right internal  carotid artery performed on November 20, 2006 by Dr. [name].  She  was readmitted to [facility] on February 2009 with a possible  new CVA.  She was found to have a stenosis within the previously placed  stent.  She underwent angioplasty of the stenosis on April 03, 2007.  Since that time, she has been maintained on aspirin and Plavix.  The  patient had a followup cerebral angiogram on December 27, 2007 that  showed a 90-95% stenosis within the previously placed Wingspan right  internal carotid artery stent at the petrous cavernous junction.  The  patient had been asymptomatic.  However, due to the progression of the  stenosis, a decision was made to have the patient return to [facility] on January 29, 2008 for further intervention.   PAST MEDICAL HISTORY:  The patient has diabetes mellitus, hypertension,  and hyperlipidemia.  She has a history of congestive heart failure with  an ejection fraction estimated to be 30-35% in the past.  She has  coronary artery disease with previous coronary artery bypass graft  surgery.  Her cardiologist is Dr. [name].  She has  a St. Jude  implantable defibrillator followed by Dr. Sharrell Ku.  The patient has  a history of mild anemia.  She had a vagal reaction in the past  following one of her interventions.   SURGICAL HISTORY:  Significant for coronary artery bypass graft surgery  in 2002.  She has had a tonsillectomy and a defibrillator implanted.  She denies previous problems with anesthesia.   ALLERGIES:  The patient is allergic to CONTRAST DYE, which causes  headaches as well as nausea and vomiting.   MEDICATIONS ON ADMISSION:  Metformin, glyburide, metoprolol, Lasix, K-  Dur, ramipril, Nitro-Dur, isosorbide, Lipitor, Plavix, aspirin, Xanax,  and fish oil.  Because of her contrast dye allergy, she was placed on a  13-hour prednisone protocol.  She was also given Benadryl and Pepcid  prior to the intervention.   SOCIAL HISTORY:  The patient is married.  She lives in [place] with  her husband.  She has 6 healthy supportive children.  She has never been  a smoker.  She does not use alcohol.  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   FAMILY HISTORY:  Her mother died from breast cancer.  Her father died  from tuberculosis.   HOSPITAL COURSE:  As noted, this patient was admitted to [facility] on January 29, 2008 for further evaluation and treatment of a  stenosis within a previously placed Wingspan stent for severe right  internal carotid artery stenosis.  On the day of admission, the patient  underwent angioplasty of the previously placed stent performed under  general anesthesia.  She did well and was admitted to the Neurointensive  Care Unit overnight.  The patient was maintained on IV heparin and the  following day the heparin was discontinued.   The patient's hospital course was complicated by nausea and vomiting as  well as an elevated white blood cell count.  It was felt that the  elevated white blood cell count was probably due to her history of  contrast reaction which  had apparently been similar in the past,  however, she did have a low-grade fever.  A Internal Medicine consult  was requested from the [facility].  They saw the patient on  January 31, 2008.  They agreed that the elevated white blood cell count  was probably due to the prednisone she had received prior to admission.  They felt the nausea and vomiting was a delayed reaction to the  contrast.  A chest x-ray was negative as was urinalysis.  The white  blood cell count appeared to be improving and the decision was made to  proceed with discharge on January 31, 2007 in improved condition.   LABORATORY DATA:  On the day of discharge, a basic metabolic panel  revealed a BUN of 16, creatinine was 1.01, potassium was 3.6, glucose  was 191, and GFR was 54.  A liver profile on the day of discharge was  within normal limits except for a total protein of 5.8, albumin 3.1.  A  urinalysis was negative.  A CBC on the day of discharge revealed  hemoglobin 10.1, hematocrit 30.2, WBCs 16.3 thousand, and platelets  221,000.  A portable chest x-ray on January 31, 2008 showed the  patient's AICD device.  The right hilum also appeared to be changed from  a previous study.  It appeared to be larger and more nodular.  It was  felt that this could be vascular, however, a mass or adenopathy could  not be excluded.  A CT scan was suggested for further evaluation.  The  CT scan was not performed during this admission.   DISCHARGE MEDICATIONS:  The patient was told to resume the medication  she was on prior to admission.  She was to restart her metformin on  Friday, February 01, 2008 which had been held secondary to the contrast.  She was told to stay on a diabetic diet.  She was given instructions  regarding wound care.  She was told not to drive or do anything  strenuous for at least 2 weeks.  She is to have a followup angiogram in  3 months.  She was told to follow up with Dr. [name], Tuesday,   February 12, 2008 at 3 p.m.  She is to follow up with Dr. Drue Novel within a  week for a repeat CBC.  She will also need a followup for her abnormal  chest x-ray.   DISCHARGE DIAGNOSES:  1. Cerebrovascular disease with previous right cerebrovascular      accident in September 2008.  2. Placement of a Wingspan  stent in the right internal carotid artery      for severe stenosis on November 20, 2006.  3. Angioplasty of the stent performed on April 03, 2007.  4. Angioplasty of the stent performed on January 29, 2008.  5. History of CONTRAST allergy.  6. History of coronary artery disease with previous myocardial      infarction.  7. Status post coronary artery bypass graft surgery.  8. Left ventricular dysfunction with ejection fraction of 30-35%.  9. Status post placement of a St. Jude implantable defibrillator,      followed by Dr. Sharrell Ku.  10.History of a vagal reaction following a previous angioplasty.  11.Nausea and vomiting with low-grade fever this admission, felt      secondary to contrast.  12.Elevated white blood cell count, felt secondary to prednisone.  13.Hyperlipidemia.  14.History of vertigo.  15.History congestive heart failure.  16.Diabetes mellitus.  17.Mild anemia.  18.Abnormal chest x-ray with CT scan recommended.  We will notify her      primary care physician's office regarding the chest x-ray.      Delton See, P.A.    ______________________________  [name]. [name], [name]    DR/MEDQ  D:  02/01/2008  T:  02/01/2008  Job:  [id number]   cc:   Arturo Morton. Riley Kill, MD, [facility]  [name], MD  Pramod P. Pearlean Brownie, MD

## 2010-07-06 NOTE — H&P (Signed)
[name] [name], TOOL                ACCOUNT NO.:  [id number]   MEDICAL RECORD NO.:  [id number]          PATIENT TYPE:  AMB   LOCATION:  SDS                          FACILITY:  [facility]   PHYSICIAN:  Sanjeev K. [name], M.D.DATE OF BIRTH:  11/16/1933   DATE OF ADMISSION:  11/20/2006  DATE OF DISCHARGE:                              HISTORY & PHYSICAL   CHIEF COMPLAINT:  Cerebrovascular disease.   HISTORY OF PRESENT ILLNESS:  Ms. [name] [name] is a pleasant 77 year old female  referred to Dr. [name] through the courtesy of Dr. Riley Kill and Dr.  Pearlean Brownie.  The patient was recently admitted to [facility] on  November 07, 2006 with a right CVA.  She initially presented with  confusion and facial droop as well as chest pain.  On November 08, 2006, the patient had an MRI that was consistent with an acute ischemic  infarct involving the watershed zone of the right anterior cerebral  artery as well as the middle cerebral artery and posterior cerebral  artery.  There were also questionable areas of acute ischemic change in  the lateral aspect of the right thalamus.  The MRA was consistent with a  high-grade severe focal stenosis of the right internal carotid artery at  the petrous-cavernous junction, with a suspicion of a high-grade  stenosis of the midbasilar artery.   Based on these findings, a cerebral angiogram was felt to be indicated.  On November 09, 2006, the patient had a cerebral angiogram performed by  Dr. [name] that revealed a 95% stenosis of the right internal carotid  artery at the petrous-cavernous junction as well as a 65%-70% stenosis  of the distal basilar and midbasilar junction.  There was also noted the  delayed hemodynamic flow of contrast distal to the right internal  carotid artery into the right middle cerebral artery distribution.   The patient does have a history of coronary artery disease.  She  underwent cardiac catheterization during her admission for  evaluation of  chest pain.  She was noted to have a low ejection fraction in the 30%-  35% range.  There was continued patency of her previously placed grafts,  although the saphenous vein graft to the distal right coronary artery  had a total occlusion of the right coronary artery beyond the graft  insertion.   The patient was discharged from the hospital on November 10, 2006 in  stable condition.  She had very little residual deficits from her CVA.  Arrangements were made for her to be seen in consultation by Dr.  [name] on November 13, 2006.  At that time, PTA stenting for the  right internal carotid artery stenosis was discussed.  The procedure was  described in detail, along with potential risks and benefits.  The  patient agreed to proceed.  She is being admitted to [facility]  today, November 20, 2006, for intervention.   PAST MEDICAL HISTORY:  1. The patient has a history of coronary artery disease.  2. She had the above-noted stroke.  3. She has diabetes.  4. Hyperlipidemia.  5. Hypertension.  6. She had coronary artery bypass graft surgery in 2002.  7. She is status post an anterior wall MI at some point in the past.  8. She has an ischemic cardiomyopathy, with an ejection fraction of      30%-35%, with chronic congestive heart failure.   SURGICAL HISTORY:  1. Tonsillectomy.  2. Coronary artery bypass graft surgery.  3. She denies any previous problems with anesthesia.   MEDICATIONS AT THE TIME OF ADMISSION:  Aspirin, Plavix, metoprolol,  Lasix, Lipitor, potassium supplement, Altace, glimepiride, nitroglycerin  p.r.n., vitamins, and fish oil.   ALLERGIES:  The patient has a history of being allergic to CONTRAST DYE.  Her reaction to this dye was that of a headache as well as nausea and  vomiting.   SOCIAL HISTORY:  The patient is married.  She lives in [place] with  her husband.  She has six children who are healthy.  She has never been  a smoker.   She does not use alcohol.  She is retired from a Brewing technologist.   FAMILY HISTORY:  Her mother died from breast cancer.  Her father died  from tuberculosis.   REVIEW OF SYSTEMS:  Completely negative, except for the above-noted past  medical history.   LABORATORY DATA:  INR 0.9, PTT 26.  Hemoglobin 12.7, hematocrit 38, WBC  mildly elevated at 13,700, platelets 181,000.  BUN 13, creatinine 0.88,  potassium 4, GFR greater than 60, glucose was 222.  A recent urine  culture had shown no growth.   PHYSICAL EXAMINATION:  GENERAL:  Reveals a very pleasant 77-yuear-old  white female in no acute distress.  VITAL SIGNS:  Blood pressure 163/84, pulse 75, respirations 18,  temperature 98.1, oxygen saturation 97% on room air.  HEENT:  Unremarkable.  NECK:  Revealed no bruits.  HEART:  Revealed regular rate and rhythm, without murmur.  LUNGS:  Clear.  ABDOMEN:  Soft, nontender.  EXTREMITIES:  Revealed pulses to be intact, with trace edema.  Her  airway was rated at a 1.  Her ASA scale was rated at a 4.  NEUROLOGIC:  Mental status:  The patient was alert and oriented and  follows commands.  Cranial nerves II-XII are grossly intact.  Sensation  is intact to light touch.  Motor strength is 5/5 throughout.  Cerebellar  testing was intact.   IMPRESSION:  1. Cerebrovascular disease, with severe stenosis of the right internal      carotid artery, as documented by recent MRI, MRA, and cerebral      angiogram.  2. Recent right cerebrovascular accident.  3. History of coronary artery disease, with recent cardiac      catheterization performed November 07, 2006.  4. Ischemic cardiomyopathy, with ejection fraction of 30%-35%.  5. Status post coronary artery bypass graft surgery, August 08, 2000.  6. Chronic congestive heart failure.  7. Diabetes mellitus.  8. Hyperlipidemia.  9. Allergy to CONTRAST DYE.  10.History of hypertension.   PLAN:  As noted, the patient is to undergo PTA stenting  of the right  internal carotid artery today to be performed by Dr. [name] under  general anesthesia if felt to be safe and indicated.  She has been given  a 13-hour prednisone  protocol due to her contrast dye allergy.  She will also receive Pepcid  and Benadryl.  She had received nimodipine to prevent spasm.  She has  also received 1 g of Ancef.  Her diabetic medications were held.  She  has received all of her other medications as noted above.      Delton See, P.A.    ______________________________  [name]. [name], [name]    DR/MEDQ  D:  11/20/2006  T:  11/20/2006  Job:  [id number]   cc:   Arturo Morton. Riley Kill, MD, [facility]  Pramod P. Pearlean Brownie, MD  [name], MD

## 2010-07-06 NOTE — Letter (Signed)
April 09, 2007    [name] Ora, MD  [phone] [place], [place] [address]   RE:  [name], [name]  MRN:  [id number]  /  DOB:  [birth year]   Dear Elita Quick:   I had the pleasure of seeing [name] [name] in the office today in follow-  up.  She recently underwent a repeat cerebral angiogram.  She also  apparently had an angioplasty done.  I was really not exactly aware of  all of these findings, but she brought be up-to-date.  She has not seen  a Development worker, international aid.  As you know, Vikki Ports admitted her to the hospital  and it was thought that she had diverticulitis, but she also had a  porcelain gallbladder noted at the time of her ultrasound.  We have  therefore gone ahead and made arrangements for her to see Dr. Ovidio Kin in consultation.   Today, the blood pressure is 160/84, pulse is 63.  The lung fields are clear.  The cardiac rhythm is regular without significant murmur.   Electrocardiogram demonstrates normal sinus rhythm with left ventricular  hypertrophy and repolarization abnormality, and also evidence of an  inferior infarction.   To briefly summarize, the patient has had right-sided numbness.  CT scan  suggested severe stenosis of the right internal artery.  She apparently  has undergone intervention in the interim.   I will see her back in follow-up in 2 months.  We will do a basic  metabolic profile on her today.  She is on multiple medications.    Sincerely,      Arturo Morton. Riley Kill, MD, [facility]  Electronically Signed    TDS/MedQ  DD: 04/09/2007  DT: 04/09/2007  Job #: [id number]   CC:    Sandria Bales. Ezzard Standing, [name]

## 2010-07-06 NOTE — Discharge Summary (Signed)
[name] [name], [name] [name]                ACCOUNT NO.:  [id number]   MEDICAL RECORD NO.:  [id number]          PATIENT TYPE:  INP   LOCATION:  3114                         FACILITY:  [facility]   PHYSICIAN:  Sanjeev K. [name], M.D.DATE OF BIRTH:  11/16/1933   DATE OF ADMISSION:  11/20/2006  DATE OF DISCHARGE:  11/21/2006                               DISCHARGE SUMMARY   CHIEF COMPLAINT:  Cerebrovascular disease.   HISTORY OF PRESENT ILLNESS:  [name] [name] is a very pleasant 77 year old  female who was referred to Dr. [name] through the courtesy of Dr.  Riley Kill and Dr. Pearlean Brownie.  The patient was recently admitted to [facility] on September 16 with a right CVA.  Her initial presentation was  confusion and a facial droop as well as chest pain.  On September 17,  the patient had an MRI that was consistent with an acute ischemic  infarct involving the watershed zone of the right anterior cerebral  artery as well as the middle cerebral artery and posterior cerebral  artery.  There was also questionable areas of acute ischemic change in  the lateral aspect of the right thalamus.  The MRI was consistent with a  high-grade severe focal stenosis of the right internal carotid artery at  the petrous cavernous junction with a suspicion of a high-grade stenosis  of the mid basilar artery as well.   Based on these findings, the cerebral angiogram was felt to be  indicated.  On November 09, 2006, the patient had the cerebral  angiogram performed by Dr. [name] that revealed a 95% stenosis of the  right internal carotid artery at the petrous cavernous junction as well  as a 65-70% stenosis of the distal basilar and mid basilar junction.  The patient was also noted to have delayed hemodynamic flow of contrast  distal to the right internal carotid artery into the right middle  cerebral artery distribution.   The patient has a history of coronary artery disease.  She underwent  cardiac  catheterization during her recent admission for evaluation of  chest pain.  She was noted to have a low ejection fraction of 30-35%.  There was noted to be continued patency of her previously placed grafts,  although the saphenous vein graft to the distal right coronary artery  had a total occlusion of the right coronary artery pre beyond the graft  insertion.   The patient was discharged from the hospital on November 10, 2006, in  stable condition.  She had very little residual deficits from her CVA.  Arrangements were made for her to be seen in consultation by Dr.  [name] on November 13, 2006.  At that time, PTA stenting of the  right internal carotid artery stenosis was discussed.  The procedure was  described in detail along with the potential risks and benefits.  The  patient agreed to proceed.  She was readmitted to [facility] on  November 20, 2006, for intervention.   PAST MEDICAL HISTORY:  Significant for coronary artery disease, the  above-noted stroke, diabetes, hyperlipidemia, hypertension, coronary  artery bypass graft  surgery in 2002, status post anterior wall MI at  some point in the past.  As noted, she has an ischemic cardiomyopathy  with ejection fraction of 30-35%.  She has chronic congestive heart  failure.   Surgical history significant for tonsillectomy, coronary artery bypass  graft surgery.  The patient denies previous problems with anesthesia.   MEDICATIONS ON ADMISSION:  Aspirin, Plavix, metoprolol, Lasix, Lipitor,  potassium supplement, Altace, glyburide, nitroglycerin p.r.n., vitamins  and fish oil.   ALLERGIES:  The patient has a history of being allergic to contrast dye.  Her reaction included headache as well as nausea and vomiting.  She did  have a 13-hour prednisone protocol prior to admission.   SOCIAL HISTORY:  The patient is married.  She lives in [place] with  her husband.  She has six children who are healthy.  She has never  been  a smoker.  She does not use alcohol.  She is retired from an Brewing technologist.   FAMILY HISTORY:  Mother died from breast cancer.  Father died from  tuberculosis.   HOSPITAL COURSE:  As noted, this patient was admitted to [facility] on November 20, 2006, for further evaluation and treatment of  cerebrovascular disease in the setting of a recent CVA.  The patient  underwent stenting of a severe right internal carotid artery stenosis at  the petrous cavernous junction performed under general anesthesia by Dr.  [name] on the day of admission.  She tolerated this well.  The  patient was later admitted to the neurointensive care unit where she  remained on IV heparin overnight.   The patient was doing well the following day.  The right femoral groin  sheath was removed.  She did have some mild hypertension that was  treated with IV Apresoline.  Approximately 30-45 minutes after the  femoral groin sheath was removed, the patient had an episode of  bradycardia and hypotension.  Her heart rate dropped into the 40 range.  Her systolic blood pressure dropped into the 70-80 range.  She was given  IV fluid resuscitation and placed in the Trendelenburg position.  The  patient denied any chest pain or shortness of breath.  She was not  diaphoretic.  She did feel faint, however, as if she was going to pass  out.  A cardiology consult was obtained.   The patient was seen in consultation by Dr. Jens Som who agreed that  this was probably a vasal vagal reaction.  Her blood pressure and heart  rate gradually improved following the IV fluids.  Parameters were  written for her antihypertensive medications.  Her normal dose of Lasix  was cut in half for that morning.   The patient is currently doing well.  She is on bedrest for 6 hours  following the removal of her femoral groin sheath.  If she continues to  remain stable, the plan will be to proceed with discharge later  this  evening.   LABORATORY DATA:  A basic metabolic panel on the day of discharge  revealed a BUN of 12, creatinine 0.91.  Her GFR was greater than 60,  potassium was 3.7, glucose was 151.  A CBC on the day of discharge  revealed hemoglobin 11, hematocrit 32.8, WBCs were elevated at 22,000.  This was felt to be secondary to prednisone which she had received due  to her contrast dye allergy.  Her platelet count was 139,000.  A follow-  up CBC is  pending at the time of this dictation.  Her CBC on September  25 revealed hemoglobin to be 12.7, hematocrit 38.  White blood cell  count was 13.7, platelet count was 181,000.  EKG performed following her  vasovagal reaction showed an old inferior MI with questionable lateral  ischemic changes.  This was not significantly changed from an EKG that  was performed earlier this month.   DISCHARGE INSTRUCTIONS:  The patient was told to resume her same  medications that she was on prior to admission.  She would continue on  aspirin and Plavix due to her stent placement.  Please see the list of  medications as noted above.   The patient was given instructions regarding wound care.  She was told  not to do anything strenuous and not to drive for at least 2 weeks.  She  will follow up with Dr. [name] in approximately 2 weeks.  She was  told to call Dr. Rosalyn Charters office for follow-up appointment as well.   PROBLEMS AT THE TIME OF DISCHARGE:  1. Status post PTA stenting of a severe right internal carotid artery      stenosis performed under general anesthesia by Dr. [name] on      November 20, 2006.  2. History of a recent right CVA November 07, 2006.  3. History of coronary artery disease with previous coronary artery      bypass graft surgery.  4. Left ventricular dysfunction with ejection fraction estimated to be      30-35%.  5. Recent heart catheterization revealing patent grafts.  6. Contrast dye allergy.  The patient did receive a  13-hour prednisone      protocol as well as IV Benadryl and IV Pepcid prior to her      intervention this admission.  7. Chronic congestive heart failure.  8. Diabetes mellitus.  9. Hyperlipidemia.  10.History of hypertension.  11.Mild anemia.  12.Vasovagal episode this admission consisting of hypotension and      bradycardia treated with fluid hydration and resolved.      Delton See, P.A.    ______________________________  [name]. [name], [name]    DR/MEDQ  D:  11/21/2006  T:  11/21/2006  Job:  [id number]   cc:   Arturo Morton. Riley Kill, MD, [facility]  Pramod P. Pearlean Brownie, MD  [name], MD

## 2010-07-06 NOTE — Assessment & Plan Note (Signed)
[facility]                         ELECTROPHYSIOLOGY OFFICE NOTE   NAME:OLIVERMargart, Zemanek                       MRN:          [id number]  DATE:05/09/2007                            DOB:          [birth year]    Mr. Tieken returns today for follow up.  She is a very pleasant 76-year-  old woman with a history of ischemic cardiomyopathy and congestive heart  failure, EF of 30-35%.  She has cerebrovascular disease and is status  post angioplasty and stenting.  Intracranial cerebral stenosis.  She has  a history of diabetes and hypertension.  The patient returns today for  follow-up.  She had no specific complaints in the interim, and she  denies any ICD therapies.  She denies chest pain.  She denies shortness  of breath.  She has very minimal dizziness.   MEDICATIONS:  1. Aspirin 325 a day.  2. Lasix 40 a day.  3. Lipitor 20 a day.  4. Altace 5 twice daily.  5. Glyburide 4 mg daily.  6. Plavix 75 a day.  7. Metoprolol XL 100 mg daily.  8. Isosorbide dinitrate 30 mg half tablet daily.  9. Potassium supplements.   PHYSICAL EXAMINATION:  GENERAL:  She is a pleasant 77 year old woman in  no acute distress.  VITAL SIGNS:  Blood pressure was 158/71, the pulse was 52 and regular,  respirations were 18.  Weight was 191 pounds.  NECK:  No jugular distention.  LUNGS:  Clear bilaterally to auscultation.  No wheezes, rales or rhonchi  were present.  CARDIOVASCULAR:  Regular rate and rhythm with normal S1-S2.  EXTREMITIES:  No edema.   Interrogation of her defibrillator demonstrates a St. Jude current  single chamber device with R-waves of 8, the impedance 510, the  threshold of 0.75 at 0.5, the battery voltage was 3.2 volts.  There are  no intercurrent ICD therapies.   IMPRESSION:  1. Ischemic cardiomyopathy.  2. Congestive heart failure.  3. Status post ICD insertion.  4. Intracranial cerebrovascular disease.   DISCUSSION:  Overall, Ms. Schneider is  stable.  I have asked that she  continue her medical therapy.  Will plan to see her back in the office  for ICD follow up in several months.     Doylene Canning. Ladona Ridgel, MD  Electronically Signed    GWT/MedQ  DD: 05/09/2007  DT: 05/09/2007  Job #: [id number]

## 2010-07-06 NOTE — Assessment & Plan Note (Signed)
[facility]                            CARDIOLOGY OFFICE NOTE   NAME:[name] [name] NEUENFELDT                       MRN:          [id number]  DATE:09/20/2006                            DOB:          11/16/1933    [name] [name] is in for a followup visit. I have not seen her in nearly 3  years. She actually has been getting along well, but unfortunately she  stopped taking a number of her medications. This patient has known left  ventricular dysfunction and has undergone revascularization surgery. She  also had mild renal artery stenosis. More recently, the patient stopped  taking a number of her medications. She denies any ongoing chest pain.   CURRENT MEDICATIONS:  Include;  1. Enteric coated aspirin 325 mg daily.  2. Metoprolol 50 mg one half tablet p.o. b.i.d.  3. Lasix 40 mg daily.  4. Metformin 500 mg p.o. b.i.d.  5. Altace 10 mg daily.  6. Lipitor 20 mg at bedtime.  7. Potassium 10 mEq daily.  8. Amaryl 4 mg p.o. b.i.d.   More recently she has not been taking the Amaryl, ran out of the  Lipitor, stopped taking the Altace.   PHYSICAL EXAMINATION:  She is alert, oriented, and in good spirits. Her  blood pressure is 160/90, pulse is 69.  LUNG FIELDS: Clear.  There is not a definite murmur.  There is no extremity edema.  There is no hepatosplenomegaly.   The electrocardiogram demonstrates normal sinus rhythm with  ST-T wave  abnormalities. These are not dramatically changed from previous studies.   IMPRESSION:  1. Coronary artery disease, status post coronary artery bypass graft      surgery.  2. Ischemic cardiomyopathy.  3. Hypertension.  4. Non-insulin dependent diabetes mellitus.  5. Medical noncompliance.   PLAN:  1. The patient's husband sees Dr. [name] and they live near      [place]. We will try to make an appointment for her to see Dr.      Drue Novel as she needs longterm primary care.  2. We will reinitiate therapy, I am only going to  give her 1 dose of      Amaryl a day. Her sugars have been running in the 160 to 180 range.      She is also on metformin and we renewed this, but she has had no      renal function testing in the last 2 years. We will go ahead and      get a basic metabolic profile today.  3. She is hypertensive, and we will resume Altace at 5 mg twice daily.      She does have a history of renal artery stenosis but she said she      just stopped her Altace recently or ran out of it.  4. Her hypercholesterolemia has been not well controlled in the past      but she has not had good follow up. We will resume Lipitor 20 mg      daily and we will plan to see her back in follow  up in 2 weeks.     Arturo Morton. Riley Kill, MD, [facility]  Electronically Signed    TDS/MedQ  DD: 09/20/2006  DT: 09/21/2006  Job #: [id number]   cc:   [name], MD

## 2010-07-06 NOTE — Consult Note (Signed)
NAMEGERLINE, RATTO NO.:  [id number]   MEDICAL RECORD NO.:  [id number]          PATIENT TYPE:  INP   LOCATION:  3015                         FACILITY:  [facility]   PHYSICIAN:  Deanna Artis. Hickling, M.D.DATE OF BIRTH:  01/01/1933   DATE OF CONSULTATION:  03/20/2007  DATE OF DISCHARGE:                                 CONSULTATION   CHIEF COMPLAINT:  Gait disorder and right facial numbness.   HISTORY OF PRESENT CONDITION:  [name] [name] is a 77 year old vasculopath  who was admitted for evaluation of right facial numbness and hand  numbness that occurred 2 days ago and organic gait disorder that  worsened about a week ago.   The patient had a CT scan on admission that showed evidence of remote  right brain strokes that were present in September 2008.  The patient  was admitted to the hospital with a left-sided weakness.  The patient  had a large lesion in the subcortical frontal white matter near the  caudate with a string of lesions extending posteriorly in a quasi  watershed distribution.  The patient was noted to have very tight  stenosis of the right internal carotid artery at the petrous cavernous  junction.  Since this is not surgically accessible, the patient  underwent stenting in October 2008 by Dr. [name].  Recent arteriogram  of the stent showed greater than 95% stenosis at the site of the stent.  There is good filling of the carotid proximal to that and there was  delayed filling distal to that with opacification of the anterior and  middle cerebral artery branches.  Left common carotid showed shallow  plaque but no hemodynamically significant stenosis.  It did not appear  from the dictation that there was significant contribution of the left  brain to the right brain except in the right anterior cerebral artery.   The patient has a number of risk factors for stroke including  hypertension, dyslipidemia, type 2 diabetes mellitus, large vessel  atherosclerotic disease, coronary artery disease, cardiomyopathy with  ejection fraction of 30-35% and congestive heart failure.   PAST MEDICAL HISTORY:  Also includes diverticulosis with gallbladder  disease, acute myocardial infarction in June 2002.  Most recent coronary  catheterization November 07, 2006.  The patient has a slightly elevated  troponin at this time.  Cardiology has seen the patient, and given the  EKG does not show ischemic changes, conservative measures were  recommended.   REVIEW OF SYSTEMS:  Negative for fevers, chills, sweats, weight change.  HEAD, EYES, EARS, NOSE AND THROAT:  No headache, nasal discharge,  epistaxis.  The patient does wear reading glasses.  She does not have  significant hearing loss.  She has dentures.  SKIN:  No lesions.  CARDIOPULMONARY:  The patient has some edema that is more brawny edema  distally.  GU:  The patient has no dysuria or straining.  No hematuria.  She does have nocturia.  REPRODUCTIVE:  She is postmenopausal.  NEUROPSYCHIATRIC:  No depression or anxiety.  MUSCULOSKELETAL:  The  patient has arthralgias of both knees.  GI:  No  nausea, vomiting,  diarrhea, melena, hematemesis.  ENDOCRINE:  The patient has type 2  diabetes mellitus.  A 12 system review is otherwise negative.   PAST SURGICAL HISTORY:  Coronary artery bypass graft 2002, stenting  October 2008, defibrillator implantation December 2008, bilateral  iridectomies, tonsillectomy.   MEDICATIONS:  1. Amaryl 4 mg daily.  2. Plavix 75 mg daily.  3. Metoprolol 100 mg daily.  4. Altace 5 mg daily.  5. Imdur 15 mg daily.  6. K-Dur 20 mEq daily.  7. Lipitor 20 mg in the evening.  Aspirin 325 mg daily.  8. Lasix 40 mg daily.  9. Nitroglycerin as needed 0.4 mg as needed.  10.Stool softener as needed.   DRUG ALLERGIES:  None known.   ENVIRONMENTAL ALLERGIES:  The patient has headache, nausea, vomiting,  hypotension with IV CONTRAST DYE.   FAMILY HISTORY:  Mother died  of breast cancer at age 45.  Father died of  TB at age 76.  The patient does not have any family history of stroke.   SOCIAL HISTORY:  The patient is married.  She lives in [place].  She  has six children.  She is retired Personal assistant.  She  has a high school diploma.  She does not use tobacco, alcohol or drugs.   PHYSICAL EXAMINATION:  GENERAL:  On examination today, this is a well-  developed, well-nourished woman, mildly obese woman in no distress.  VITAL SIGNS:  Height 67 inches, weight 89.4 kg, temperature 98.3, blood  pressure 136/70.  Resting pulse 73, regular.  Respirations 20, oxygen  saturation 97%.  Capillary glucose 162.  HEAD, EYES, EARS, NOSE AND THROAT:  No signs of infection.  NECK:  Supple neck full range of motion.  No cranial or cervical bruits.  LUNGS:  Clear to auscultation.  HEART:  No murmurs.  Pulses normal.  ABDOMEN:  Soft, nontender.  Bowel sounds normal.  EXTREMITIES:  Well-formed without edema, cyanosis, alterations in tone  or tight heel cords.  She does have some brawny edema in her legs.   NEUROLOGIC EXAMINATION:  MENTAL STATUS:  The patient is awake, alert  without dysphasia or dysarthria.  CRANIAL NERVES:  Round reactive  pupils.  Visual fields full to simultaneous stimuli.  Symmetric facial  strength, midline tongue, air conduction greater than bone conduction.  MOTOR:  Examination normal strength except hip flexors 4/5.  Fine motor  movements were septal no drift.  Sensation peripheral polyneuropathy,  good proprioception, slightly decreased vibration distally.  She had  good stereognosis.  Gait broad-based dystaxic.  She requires contact  assistance.  She is areflexic.  She had bilateral flexor plantar  responses.   IMPRESSION:  Multiple remote right brain infarctions from distal carotid  artery stenosis status post stenting. (434.91) Stent is reoccluded.  The  patient's most recent infarction involved the dorsal left pons.   This is  not a subacute stroke, despite the fact that she has subacute symptoms.  This has been there for some time incurred sometime between September  2008 and now.   Since we cannot do an MRI scan, we cannot age this, but it is too well-  demarcated and cystic to be a subacute stroke.   PLAN:  We will evaluate the patient's risk factors with hemoglobin A1c,  serum lipid panel and serum homocystine and treat this accordingly.  The  patient has already had an angiogram.  She does not need carotid  Dopplers.  She should have a transcranial  Doppler to look at  intracranial circulation.  We will need to discuss angioplasty that Dr.  [name] has planned with Dr. Pearlean Brownie and obtain his opinion.   I appreciate the opportunity to participate in her care.  I would not  make any changes at this time.  She needs physical therapy for gait  evaluation.      Deanna Artis. Sharene Skeans, [name]  Electronically Signed     [facility]/MEDQ  D:  03/20/2007  T:  03/21/2007  Job:  [id number]   cc:   [name], MD  Raenette Rover. Felicity Coyer, MD  Arturo Morton Riley Kill, MD, [facility]  Kerby Nora

## 2010-07-06 NOTE — Discharge Summary (Signed)
[name] [name], [name] [name] NO.:  [id number]   MEDICAL RECORD NO.:  [id number]          PATIENT TYPE:  INP   LOCATION:  3711                         FACILITY:  [facility]   PHYSICIAN:  Arturo Morton. Riley Kill, MD, FACCDATE OF BIRTH:  11/16/1933   DATE OF ADMISSION:  11/07/2006  DATE OF DISCHARGE:  11/10/2006                               DISCHARGE SUMMARY   DISCHARGE DIAGNOSES:  1. Right brain cerebrovascular accident.  2. Chest pain/coronary artery disease, status post coronary artery      bypass graft August 08, 2000, with patent grafts on cardiac      catheterization this admission.  3. Ischemic cardiomyopathy.  4. Chronic systolic congestive heart failure.  5. Type 2 diabetes mellitus.  6. Hyperlipidemia.  7. Vertigo.  8. IV contrast allergy.   PROCEDURES:  1. Left heart cardiac catheterization.  2. MRI/MRA of the brain.  3. Two-dimensional echocardiogram.  4. Cerebral angiography.   HISTORY OF PRESENT ILLNESS:  A 77 year old, married, Caucasian female  with the above problem list who was admitted to [facility] on  November 07, 2006, from the office after seeing Dr. Riley Kill complaining  of several episodes of chest tightness.  Her family also reported that  she had not been right over the past couple of days noting some mild  confusion, facial droop and change in personality.   HOSPITAL COURSE:  Following admission, [name] [name] underwent left heart  cardiac catheterization on September 16, which revealed 4/4 patent  grafts with a total occlusion of the distal RCA, distal to the  anastomosis.  The PDA filled via left-to-right collaterals.  It was felt  that she would benefit most from medical management.  Because of the  family's recognition of some changes neurologically, an MRI/MRA was  performed on September 17, which revealed an acute ischemic infarct  involving the watershed zone of the right anterior cerebral artery,  middle cerebral artery/posterior  cerebral artery distribution with foci  of signal hyperintensity involving the posterior temporal and right  posterior parietal subcortical white matter.  There were also  questionable areas of acute ischemic change in the lateral aspect of the  right thalamus.  MRA showed probable high-grade severe focal stenosis of  the right internal carotid artery at the petrous-cavernous junction and  suspicion for high-grade stenosis of the mid basilar artery.  As a  result of this finding, neurology was consulted and her MRI/MRA was  reviewed and it was felt that she would benefit most from cerebral  angiography.  From there, interventional radiology was consulted and she  underwent cerebral angiography by Dr. [name] on September 18,  revealing a preocclusive, greater than 95% severe stenosis of the right  internal carotid artery with hemodynamically decreased flow into the  right middle cerebral artery.  She is also noted to have a 65-70%  stenosis at the junction of the distal to mid basilar artery.  She was  seen by Dr. Pearlean Brownie this morning who discussed risks and benefits of  percutaneous transluminal angioplasty plus/minus stenting versus medical  therapy and at this time, Mrs. [name] [name] and  her husband prefer to pursue  medical therapy.  She has been placed on Plavix by Dr. Pearlean Brownie with  recommendation for outpatient followup in 4 weeks.   DISCHARGE LABORATORY DATA AND X-RAY FINDINGS:  Hemoglobin 12.5,  hematocrit 36.6, WBC 15.1, platelets 198.  Sodium 140, potassium 4.1,  chloride 100, CO2 31, BUN 19, creatinine 1.17, glucose 174, calcium 9.1.  Urinalysis was normal.  Urine culture showed no growth.   DISPOSITION:  The patient is being discharged home today in good  condition.   FOLLOWUP:  She is to have followup with Dr. Pearlean Brownie on October 14, at  12:15 p.m.  We will arrange for her to follow up with Dr. Riley Kill in  approximately 2 weeks.  She has followup with Dr. Drue Novel as previously   scheduled.   DISCHARGE MEDICATIONS:  1. Aspirin 325 mg daily.  2. Plavix 75 mg daily.  3. Metoprolol ER 100 mg daily.  4. Lasix 40 mg daily.  5. Lipitor 20 mg q.p.m.  6. Potassium 10 mEq daily.  7. Altace 5 mg daily.  8. Glimepiride 4 mg daily.  9. Nitroglycerin 0.4 mg sublingual p.r.n. chest pain.   OUTSTANDING LABORATORY STUDIES:  None.   Duration of discharge encounter 45 minutes including physician time.      Nicolasa Ducking, ANP      Arturo Morton. Riley Kill, MD, [facility]  Electronically Signed    CB/MEDQ  D:  11/10/2006  T:  11/10/2006  Job:  [id number]   cc:   [name], MD  Pramod P. Pearlean Brownie, MD

## 2010-07-06 NOTE — H&P (Signed)
NAME:  AKAILA, RAMBO NO.:  [id number]   MEDICAL RECORD NO.:  [id number]          PATIENT TYPE:  INP   LOCATION:  1826                         FACILITY:  [facility]   PHYSICIAN:  [name], M.D.DATE OF BIRTH:  11/16/1933   DATE OF ADMISSION:  02/20/2007  DATE OF DISCHARGE:                              HISTORY & PHYSICAL   PRIMARY CARE PHYSICIAN:  [name], MD   CHIEF COMPLAINT:  Nausea, vomiting, and chills.   HISTORY OF PRESENT ILLNESS:  The patient is a 77 year old white female  with past medical history of CAD status post CABG, who just had a  pacemaker implanted several weeks ago as well as a history of diabetes  mellitus, who has been in previously good health, but then today started  having some episodes of nausea and vomiting.  She had no associated  abdominal pain.  Her family also noted that she was having rigors and  she was brought into the emergency room.  In the emergency room she was  noted to have a white count of 17.  A CT scan of the abdomen and pelvis  was done, which showed signs consistent with diverticulitis.  Currently  the PAX system is down.  I am unable to confirm this report.  While the  patient was staying in the emergency room she had a brief syncopal  episode, but quickly revived.  She had no episodes on telemetry.  Currently she is still bed, where she denies any headaches, vision  changes, dysphagia.  She complains of some mild nausea.  No chest pain  or palpitations.  No shortness of breath, wheeze cough, no abdominal  pain.  No hematuria, dysuria, constipation, or diarrhea, localized  numbness, weakness, or pain.  Review of systems is otherwise negative.   PAST MEDICAL HISTORY:  Includes:  1. CAD status post CABG.  2. Hypertension.  3. Status post defibrillator recently.  4. Diabetes mellitus.   MEDICATIONS:  1. She is on p.r.n. Darvocet which she was given status post pacer.  2. Aspirin 325.  3. Lasix 40.  4. K-Dur  10.  5. Altace 5 b.i.d.  6. Glyburide 4.  7. Lipitor 20 at bedtime.  8. Plavix 75.  9. Metoprolol 100.  10.Imdur 15.   ALLERGIES:  SHE HAS NO KNOWN DRUG ALLERGIES.   SOCIAL HISTORY:  She denies any tobacco, alcohol, or drug use.   FAMILY HISTORY:  Noncontributory.   PHYSICAL EXAMINATION:  The patient's vitals on admission, temp 101.6,  heart rate 94, blood pressure 124/59.  Her blood pressure crept  down to  as low as 74/40, now back up to 140/67.  Temp now 99.7.  Respirations  22.  O2 sat 96% on room air.  GENERAL:  She is alert and oriented x3 in no apparent distress.  HEENT:  Normocephalic, atraumatic.  Mucous membranes are dry.  She has  no carotid bruits.  HEART:  Regular rate and rhythm, S1, S2.  LUNGS:  Clear to auscultation bilaterally.  ABDOMEN:  Soft, nontender.  Hyperactive bowel sounds.  Mild distension.  EXTREMITIES:  No  clubbing, cyanosis, but a 1-2+ pitting edema from the  knees down.  She also has a blister-like lesion rather than a pressure  sore on the back of her right heel.  This is currently stable.  No signs  of actual wound desiccation.   LAB WORK:  CT scan of the abdomen again as conferred by ER attending,  not able to confirm.  White count 17.1, H and H 11.8 and 36, MCV of 83,  platelet count 167, 96% shift.  Sodium 142, potassium 3.4, chloride 103,  bicarb 29, BUN 13, creatinine 0.9, glucose 138.  UA and INR is  unremarkable.   ASSESSMENT:  1. Diverticulitis.  N.p.o. and intravenous Cipro and Flagyl.  Will      continue to follow labs.  This seems to be the main source of her      infection.  If she continues to have further problems would      consider other alternatives, may be the wound on her heel, although      that appears to be more of a blister and appears to be intact, as      well as possible pacemaker site, although currently that appears      also to be intact as well.  2. Syncope likely secondary to the patient taking her medications  this      morning then having nausea and vomiting episode from      diverticulitis, which left her orthostatic.  Continue intravenous      fluids.  3. Hypertension and coronary artery disease, holding medications.      Need to restart aspirin and Plavix shortly.  4. Diabetes mellitus, sliding scale only.      [name], [name]  Electronically Signed     SKK/MEDQ  D:  02/20/2007  T:  02/20/2007  Job:  [id number]   cc:   Doylene Canning. Ladona Ridgel, MD  [name], MD

## 2010-07-06 NOTE — Assessment & Plan Note (Signed)
[facility]                            CARDIOLOGY OFFICE NOTE   NAME:Ottey, DESERE GWIN                       MRN:          [id number]  DATE:11/03/2006                            DOB:          11/16/1933    SUBJECTIVE:  [name] [name] is in for a follow-up visit.  She is an add-on.  She  was in the grocery store and picked up some Peps[employer] cartons and a  detergent box and developed heaviness like she has had in the past. This  happened about one week ago.  She also noticed some fullness in the  chest when she went to the washing machine, and had symptoms similar to  what she has had in the past again, so she stopped.   As noted, she recently had stopped all of her medicines, but has resumed  these.   CURRENT MEDICATIONS:  1. Enteric-coated aspirin 325 mg daily.  2. Metoprolol 50 mg, 1/2 tab p.o. twice daily.  3. Lasix 40 mg daily.  4. Lipitor 20 mg q.h.s.  5. Potassium 10 mEq daily.  6. Altace 5 mg twice daily.  7. Glimepiride 4 mg daily.   PHYSICAL EXAMINATION:  GENERAL:  She is alert and oriented.  VITAL SIGNS:  Blood pressure 138/80, pulse 63.  LUNGS:  Fields are clear.  HEART:  Rhythm is regular.   Her electrocardiogram demonstrates normal sinus rhythm.  There is an age-  determinate probable inferior infarction, although this could just be  left axis deviation.  There is nonspecific T-wave flattening.   PLAN:  I have discussed the options with her.  I think most likely we  would be benefited by consideration of repeat cardiac catheterization.  Importantly, the patient has undergone previous revascularization  surgery.  She is at risk for recurrent cardiac events.  She recently  stopped all of her medications.  As a result, she believes and I agree  with her that I think that most likely we should proceed on with a  cardiac catheterization.  She is amenable to this.  Preparations will be  made.     Arturo Morton. Riley Kill, MD, [facility]  Electronically  Signed    TDS/MedQ  DD: 11/07/2006  DT: 11/07/2006  Job #: [id number]

## 2010-07-06 NOTE — Consult Note (Signed)
NAME:  [name] [name], [place] NO.:  [id number]   MEDICAL RECORD NO.:  [id number]          PATIENT TYPE:  OIB   LOCATION:  3703                         FACILITY:  [facility]   PHYSICIAN:  Marlan Palau, [name]  DATE OF BIRTH:  11/16/1933   DATE OF CONSULTATION:  11/08/2006  DATE OF DISCHARGE:                                 CONSULTATION   HISTORY OF PRESENT ILLNESS:  The patient is a 77 year old right-handed  white female born November 16, 1933 with a history of diabetes and  hypertension and coronary artery disease.  The patient presents with  complaints of chest discomfort and was brought in for cardiac  catheterization.  The family had reported to the physician that she  began having some facial droop five days prior to this evaluation and  had a change in personality, becoming more quiet and slightly confused.  For this reason, an MRI scan of the brain was ordered showing evidence  of acute or subacute watershed infarct of the right brain in the  anterior/middle cerebral distribution with evidence of possible right  thalamic infarct as well.  There appeared to be some mid basilar  stenosis and possible high-grade stenosis of the right cavernous  internal carotid artery.  For this reason, Neurology was asked to see  the patient for further evaluation.  The patient denies any significant  problems with headaches, visual field changes.  Denies problems with  numbness or weakness in the arms or legs.  The patient has not reported  any change in an balance and no dizziness.   PAST MEDICAL HISTORY:  Significant for:  1. History of right internal carotid artery stenosis, mid basilar      stenosis, right brain watershed stroke, possible thalamic stroke.  2. CABG procedure in 2002.  3. Diabetes.  4. Dyslipidemia.  5. Anterior wall MI in the past.  6. History of vertigo.  7. Tonsillectomy.  8. Hypertension.   ALLERGIES:  THE PATIENT HAS NO KNOWN ALLERGIES.   HABITS:  Does  not smoke or drink.   CURRENT MEDICATIONS:  Include aspirin 325 mg daily, Lasix 40 mg daily,  Amaryl 4 mg daily, Lopressor 50 mg b.i.d., potassium 10 mEq daily,  Altace 5 mg daily, Zocor 40 mg a day, Tylenol if needed.   SOCIAL HISTORY:  This patient is married, lives in the [place], [place] area.  Is retired.  Has six children who are in good health.   FAMILY MEDICAL HISTORY:  Notable that mother died with breast cancer,  father died with tuberculosis.  The patient was an only child.  No  history of stroke or diabetes in the family.   REVIEW OF SYSTEMS:  Notable for no recent fevers, chills.  The patient  denies headache, vision changes, swallowing problems.  Denies any neck  pain.  Does note some slight shortness of breath has had some right  shoulder pain and some chest discomfort.  Denies any abdominal pain,  nausea, vomiting and troubles controlling bowels or bladder, blackout  episodes.   PHYSICAL EXAMINATION:  VITALS:  Blood pressure is 137/71,  heart rate 63,  respiratory 20, temperature afebrile.  GENERAL:  This patient is a fairly well-developed white female who is  alert, cooperative at the time of examination.  HEENT:  Head is atraumatic.  Eyes:  Pupils equal, round and to light.  Disks are flat bilaterally.  NECK:  Supple.  No carotid bruits noted.  RESPIRATORY:  Clear.  CARDIOVASCULAR:  Reveals a regular rate and rhythm.  No obvious murmurs,  rubs noted.  EXTREMITIES:  Without significant edema, although trace edema is  present.  NEUROLOGIC EXAMINATION:  Cranial nerves as above.  Slight left facial  droop is noted.  Pinprick sensation in the face is symmetric.  Extraocular movements again are full, visual fields are full.  Speech is  well enunciated, not aphasic.  Motor testing reveals 5/5 strength in all  fours.  Good symmetric motor tone is noted throughout.  Mild drift is  noted in the right upper extremity.  Patient has good finger-nose-  finger,  heel-to-shin.  Gait was not tested.  Deep tendon reflexes  relatively symmetric.  Toes are neutral bilaterally.  No evidence of  extinction was seen.   NIH Stroke Scale score of 3 was noted.   LABORATORY VALUES:  Notable for white count 15.1, hemoglobin of 12.5,  hematocrit 36.6, MCV of 83.7, platelets of 198.  Sodium 142, potassium  3.6, chloride of 103, CO2 27, glucose 145, BUN of 16, creatinine 0.85,  calcium 9.2.   Urinalysis reveals specific gravity of 1.029, pH of 6.0.  Large amount  of blood, white count of 3 to 6, red cells 3 to 6.   MRI scan and MRI angiogram as above.   IMPRESSION:  1. Right brain stroke with a watershed features, middle cerebral,      anterior cerebral.  2. Right internal carotid artery stenosis of the cavernous portion,      mid basilar stenosis.  NIH Stroke Scale of 3.  3. History of coronary artery disease.  4. Diabetes.  5. Hypertension.   This patient has had minimal deficits, likely sustained some sort of an  event five days ago.  The patient is not a candidate for tPA due to  duration of symptoms and minimal deficit.  The patient may not have good  collateral flow given the presence of a watershed type infarct in the  distribution of the right internal carotid artery.  Cerebral angiogram  may be indicated to fully delineate the cerebrovascular circulation.  It  is possible that the stent may need to be considered.   PLAN:  1. Cerebral angiogram.  2. Aspirin therapy for now.  3. We will follow clinical course while in-house,      C. Lesia Sago, [name]  Electronically Signed     CKW/MEDQ  D:  11/08/2006  T:  11/09/2006  Job:  [id number]   cc:   Arturo Morton. Riley Kill, MD, [facility]

## 2010-07-06 NOTE — Discharge Summary (Signed)
[name] [name], [name] [name]                ACCOUNT NO.:  [id number]   MEDICAL RECORD NO.:  [id number]          PATIENT TYPE:  INP   LOCATION:  3106                         FACILITY:  [facility]   PHYSICIAN:  Sanjeev K. [name], M.D.DATE OF BIRTH:  1932-12-01   DATE OF ADMISSION:  04/03/2007  DATE OF DISCHARGE:  04/04/2007                               DISCHARGE SUMMARY   CHIEF COMPLAINT:  Cerebrovascular disease.   HISTORY OF PRESENT ILLNESS:  This is a pleasant 77 year old female  initially referred to Dr. [name] through the courtesy of Dr. Riley Kill  and Dr. Pearlean Brownie.  The patient was admitted to [facility] in  September 2008 with a right CVA.  An MRI at that time showed a high-  grade focal stenosis of the right internal carotid artery.  There was  also a high-grade stenosis of the basilar artery.  The patient had a  cerebral angiogram performed on November 09, 2006, by Dr. [name]  that revealed a 95% stenosis of the right internal carotid artery as  well as a 65% to 70% stenosis of the distal basilar and mid basilar  junction arteries.  On November 20, 2006, the patient underwent  PTA/stenting of the right internal carotid artery performed by Dr.  [name].   The patient has been followed since that time.  There were some concerns  that she had developed an in-stent stenosis.  The last cerebral  angiogram was performed on March 16, 2007.  This revealed an interval  development of a severe focal stenosis within a previously stented  portion of the right internal carotid artery.  Continued aspirin and  Plavix therapy was recommended with possible repeat intervention.  On  March 19, 2007, the patient was readmitted to [facility] with  right-sided numbness.  There was a question of subacute infarct in the  right pontine lacunar area.  A neurology consult was obtained from Dr.  Sharene Skeans.  He felt that this infarct was old.  During the patient's  stay, she had a CT  perfusion scan performed on March 23, 2007.  This  was consistent with a severe stenosis of the right internal carotid  artery at the petrous-cavernous junction of the stented segment.  Dr.  [name] reevaluated the patient while she was in the hospital.  Arrangements were made to discharge the patient on March 24, 2007, to  return on April 03, 2007, for further intervention on the right  internal carotid artery stenosis.   PAST MEDICAL HISTORY:  Significant for diabetes mellitus, hypertension,  hyperlipidemia, and congestive heart failure with an ejection fraction  of 30% to 35%.  She has a history of mild anemia.  She has a history of  previous coronary artery bypass graft surgery.  She had a right CVA on  November 07, 2006, although she has had very little residual deficits.   PAST SURGICAL HISTORY:  Significant for tonsillectomy and significant  for coronary artery bypass graft surgery.  The patient denies any  previous problems with anesthesia, although she has had some vagal  reactions following removal of her right femoral  groin sheath in the  past.   ALLERGIES:  The patient is allergic to CONTRAST DYE.  She received a 13-  hour prednisone protocol prior to admission.   MEDICATIONS AT THE TIME OF ADMISSION:  Included:  1. Aspirin 325 mg daily.  2. Plavix 75 mg daily.  3. Ramipril 5 mg once daily.  4. Metoprolol XL 100 mg daily.  5. Glyburide 4 mg daily.  6. Potassium chloride 20 mEq daily.  7. Lipitor 20 mg daily.  8. Imdur 15 mg daily.  9. She is also on metformin   SOCIAL HISTORY:  The patient is married.  She lives in [place] with  her husband.  She has 6 children who are all healthy.  The patient has  never been a smoker.  She does not use alcohol.  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   FAMILY HISTORY:  Her mother died from breast cancer.  Her father died  from tuberculosis.   HOSPITAL COURSE:  As noted, this patient was admitted to [facility] on April 03, 2007, for further treatment of cerebrovascular  disease.  On the day of admission, the patient had right common and  right internal carotid arteriograms performed by Dr. [name], followed  by intracranial balloon angioplasty for revascularization of his  severely stenotic right internal carotid artery in the petrous-cavernous  junction stented portion.  The patient tolerated this procedure well.  She had a residual narrowing of approximately 30% following the  procedure.   The patient was admitted to the neuro intensive care unit where she  remained on IV heparin overnight.  A consult was obtained from [facility]  Internal Medicine due to elevated glucose levels, which was felt  secondary to the prednisone she received for her contrast allergy.  The  patient's blood glucose was brought under control.  Her IV heparin was  discontinued the following day and the right femoral groin sheath was  removed.  The patient was placed on bed rest for 6 hours.  Following the  bed rest, arrangements were made to discharge the patient in stable and  improved condition on the evening of April 04, 2007.   LABORATORY DATA:  On the day of discharge, the patient's hemoglobin was  10.6, hematocrit was 30.7, WBC 18,900 felt secondary to prednisone, and  platelets were 185,000.  Prior to admission hemoglobin had been 12.6,  hematocrit 27.3, although her white count at that time was 15,000.  On  the day of discharge, a chemistry profile revealed BUN of 13, creatinine  0.9, potassium was 4, and glucose was 147.   DISCHARGE INSTRUCTIONS:  The patient was told to continue her home  medications as previously taken, although she was not to resume her  metformin for several days.  She was told to stay on a low-cholesterol  diabetic diet.  She was given instructions regarding wound care.  She  was not to do anything strenuous including driving for 2 weeks.  She  would follow up with  Dr. [name] on April 19, 2007.   DISCHARGE DIAGNOSES:  1. Diffuse cerebrovascular disease.  2. High-grade stenosis of the right internal carotid artery status      post stent-assisted angioplasty performed on November 20, 2006.  3. Intracranial balloon angioplasty for revascularization of a      severely stenotic right internal carotid artery and a petrous-      cavernous junction stented portion performed on April 03, 2007.  4. Allergy  to contrast dye treated with prednisone, Benadryl, and      Pepcid.  5. History of vagal reaction.  6. Diabetes mellitus with elevated blood glucose secondary to      prednisone protocol.  7. Hypertension.  8. Hyperlipidemia.  9. History of congestive heart failure with an ejection fraction of      30% to 35%.  10.Anemia following her procedure.  11.History of coronary artery bypass graft surgery.  12.History of cholecystitis, internal medicine did note during her      stay that the patient has a porcelain gallbladder and further      followup was recommended with a surgeon.  13.Diverticular disease.  14.Coronary artery disease with previous myocardial infarction.  15.Status post implanted defibrillator.      Delton See, P.A.    ______________________________  [name]. [name], [name]    DR/MEDQ  D:  05/30/2007  T:  05/30/2007  Job:  [id number]   cc:   Arturo Morton. Riley Kill, MD, [facility]  [name], MD  Pramod P. Pearlean Brownie, MD

## 2010-07-06 NOTE — Letter (Signed)
March 13, 2007    [name], MD  [phone] [place], [place] [address]   RE:  [name] [name], [name] [name]  MRN:  [id number]  /  DOB:  11/16/1933   Dear Elita Quick:   I called [name] [name] today to follow-up on her.  As you know, that this  is a fairly complicated woman with known significant heart disease.  She  has also had intracranial vascular stenting.  She was recently admitted  with nausea and vomiting and found to have abnormal liver function  studies.  She had diverticular changes on CT scan and was treated for  presumptive diverticulitis by Rene Paci.  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  As a result, I called her to make sure that this  was being followed up upon.  She told me that she had decided to put off  surgical referral, but I have reviewed those findings with her and  strongly encouraged her to follow-up with a surgeon.  She is to have a  intracranial studies done this week with Dr. [name], and promised me  that next week she would go ahead make arrangements for a surgical  evaluation.  I have suggested [facility], and she will  make that appointment.  She knows to call back if she is unsuccessful.  Again, she promised me she would.    Sincerely,      Arturo Morton. Riley Kill, MD, [facility]  Electronically Signed    TDS/MedQ  DD: 03/13/2007  DT: 03/13/2007  Job #: [id number]

## 2010-07-06 NOTE — Assessment & Plan Note (Signed)
[facility]                         ELECTROPHYSIOLOGY OFFICE NOTE   NAME:[name] [name], Kohan                       MRN:          [id number]  DATE:12/21/2006                            DOB:          11/16/1933    Ms. Allaire is referred today by Dr. Bonnee Quin for evaluation and  consideration for possible ICD implantation.   HISTORY OF PRESENT ILLNESS:  The patient is a very pleasant, 77 year old  woman with long-standing ischemic cardiomyopathy and class 2 congestive  heart failure. She also has diabetes, peripheral vascular disease,  carotid vascular disease and coronary disease and is status post  myocardial infarction. She underwent recent catheterization which  demonstrated an EF of 30% by left ventriculography. The patient was  subsequently found to have severe right carotid stenosis and underwent  endovascular carotid  stenting. The patient returns today for followup.  She is referred today for additional evaluation. She denies any history  of frank syncope, though she does have occasional dizzy spells. She  denies symptomatic arrhythmias. She has a history of hypertension and  dyslipidemia.   Her family history was noncontributory due to her advanced age.   The past medical history is as noted in the HPI. She has hypertension  and diabetes as previously noted. Her EF has been variable in the past,  as low as 20%, as high as 40%; most recently, it was 30%.   Her review of systems is otherwise unremarkable except as noted in the  HPI.   On physical exam, she is a pleasant, 77 year old woman in no acute  distress. The blood pressure today was 150/80. The pulse was 70 and  regular. The respirations were 18. The weight was 195 pounds.  HEENT:  Was normocephalic, atraumatic. Pupils equal and round. The  oropharynx was moist. The sclerae were anicteric.  NECK:  Revealed no jugular venous distention. There was no thyromegaly.  Trachea was midline.  The carotids were 2+ and symmetric.  LUNGS:  Were clear bilaterally to auscultation. No wheezes, rales or  rhonchi were present. There is no increased work of breathing.  CARDIOVASCULAR EXAM:  Revealed a regular rate and rhythm with a normal  S1 and S2. There were no murmurs, rubs, or gallops present.  The abdominal exam was soft, nontender, nondistended. There was no  organomegaly.  EXTREMITIES:  Demonstrated no clubbing, cyanosis, or edema. The pulses  were 2+ and symmetric.  NEUROLOGIC EXAM:  Alert and oriented x3. Cranial nerves were intact.  Strength was 5/5 and symmetric.   EKG demonstrates sinus rhythm with prior anterior MI.   Note, her medications include:  1. Aspirin 325 a day.  2. Lasix 40 a day.  3. Lipitor 20 a day.  4. Altace 5 twice daily.  5. Glyburide 4 mg daily.  6. Plavix 75 daily.  7. Metoprolol ER 100 daily.  8. Isosorbide dinitrate 30 mg daily.   IMPRESSION:  1. Ischemic cardiomyopathy.  2. Congestive heart failure, class II.  3. Carotid vascular disease.  4. Hypertension.  5. Dyslipidemia.   DISCUSSION:  I discussed treatment options with the patient and her  family who is with her today. The indications, risks, benefits, goals  and expectations of prophylactic ICD insertion (Madit II) have been  discussed with the patient. She is considering her options. She will  call us if she would like to proceed with ICD implant. We plan to see  the patient back in the office following her implant should she like to  proceed with this.     Doylene Canning. Ladona Ridgel, MD  Electronically Signed    GWT/MedQ  DD: 12/21/2006  DT: 12/22/2006  Job #: [id number]

## 2010-07-06 NOTE — Assessment & Plan Note (Signed)
[facility]                            CARDIOLOGY OFFICE NOTE   NAME:Benninger, HODAYA CURTO                       MRN:          [id number]  DATE:01/11/2007                            DOB:          11/16/1933    [name] [name] is in for a followup visit.  In general, she is really doing quite  well.  This is the best she has done for some time.  She underwent neuro  intervention with a stent placement.  She has had no recurrent  neurologic symptoms after this.  We have also had her seeing Dr. Ladona Ridgel  and she is currently set up to have an implantable defibrillator placed  on December 3.  She denies any ongoing current symptoms.   Her current medications include:  1. Enteric-coated aspirin 325 mg daily.  2. Lasix 40 mg daily.  3. Lipitor 20 mg q.h.s.  4. Potassium 10 mEq daily.  5. Altace 5 mg p.o. b.i.d.  6. Glimepiride 4 mg daily.  7. Plavix 75 mg daily.  8. Metoprolol ER 100 mg daily.  9. Isosorbide dinitrate 30 mg one-half tablet daily.   On physical, she is alert and oriented in no acute distress.  The blood  pressure is 152/80, the pulse is 68.  The lung fields are clear.  The  cardiac rhythm is regular without a significant murmur and there is no  extremity edema.   IMPRESSION:  1. Patient with known coronary artery disease status post recent      cardiac catheterization demonstrating ejection fraction of 30-35%      range with mild mitral regurgitation, continued patency of the      internal mammary to the left anterior descending artery, continued      patency of the saphenous vein grafts to the intermediate and obtuse      marginal, and continued patency of the saphenous vein graft to the      distal right but total occlusion of the right beyond the graft      insertion and retrograde filling of the right from the previous      vessel.  2. Decreased left ventricular function with need of automatic      implanted cardioverter defibrillator.  3.  Status post percutaneous neurologic intervention.  She underwent      endovascular carotid stenting for a severe right carotid stenosis.   PLAN:  1. The patient is to be admitted on December 3 for study.  2. Return to cardiology clinic in 2 months.  3. I have encouraged her to continue to take her potassium on a      regular basis.     Arturo Morton. Riley Kill, MD, [facility]  Electronically Signed    TDS/MedQ  DD: 01/11/2007  DT: 01/12/2007  Job #: [id number]

## 2010-07-06 NOTE — Consult Note (Signed)
NAME:  [name] [name], [name] [name]                ACCOUNT NO.:  [id number]   MEDICAL RECORD NO.:  [id number]          PATIENT TYPE:  OUT   LOCATION:  XRAY                         FACILITY:  [facility]   PHYSICIAN:  Sanjeev K. [name], M.D.DATE OF BIRTH:  [birth year]   DATE OF CONSULTATION:  DATE OF DISCHARGE:                                 CONSULTATION   CHIEF COMPLAINT:  Cerebrovascular disease status post angioplasty of a  previously placed right internal carotid artery stent performed on  January 29, 2008.   BRIEF HISTORY:  This is a very pleasant 77 year old female with a  history of a CVA in September 2008.  She was eventually found to have a  severe right internal carotid artery stenosis.  She underwent placement  of a Wingspan stent on November 20, 2006.  She returned in February  2009 for an angioplasty of the stent after it was found to be stenosed.  A followup angiogram later showed further restenosis and the patient was  recently admitted to [facility] on January 29, 2008, for a  repeat angioplasty of the previously placed right internal carotid  artery Wingspan stent.  The patient returns today accompanied by her  husband to be seen in followup.   Past medical history is significant for a contrast dye allergy, which is  associated with nausea and vomiting.  She had nausea and vomiting  following her most recent procedure despite premedication with  prednisone, Pepcid, and Benadryl.  She has a history of a right  cerebrovascular accident in September 2008.  She has coronary artery  disease with a previous MI.  She is status post coronary artery bypass  graft surgery.  She has had previous cardiac stents.  She has a left  ventricular dysfunction with an ejection fraction of 30-35%.  She has an  implantable defibrillator, which I believe is a St. Jude device placed  by Dr. Lewayne Bunting.  She has had previous vagal reactions following her  angioplasties.  She had an elevated white  blood cell count this last  admission felt secondary to prednisone.  She has a history of  hyperlipidemia, history of vertigo, history of congestive heart failure,  diabetes mellitus with elevated blood glucose secondary to prednisone  therapy this most recent admission.  She also had mild anemia during  that stay following her procedure.  She had an abnormal chest x-ray  during that stay, and we will follow up with her primary care physician,  Dr. Drue Novel, for a CT scan as recommended.   ALLERGIES:  As noted, she is allergic to CONTRAST DYE.   Medications include aspirin and Plavix therapy which is chronic.  She is  also on metformin, glyburide, metoprolol, Lasix, potassium supplement,  ramipril, Nitro-Dur, isosorbide, Lipitor, Xanax, and fish oil.   SOCIAL HISTORY:  The patient is married.  She lives in [place] with  her husband.  She has 6 healthy supportive children.  She has never been  a smoker.  She does not use alcohol.  She is retired from a [employer] technologist.   FAMILY HISTORY:  Her  mother died from breast cancer.  Her father died  from tuberculosis.   IMPRESSION AND PLAN:  As noted, the patient returns today accompanied by  her husband to be seen in followup by Dr. [name] after undergoing an  angioplasty of a previously placed right internal carotid artery stent  on January 29, 2008.  Dr. [name] reviewed the before and after  pictures of the angioplasty with the patient and her husband and the  stent now appeared to be widely patent.   The patient has had no symptoms following her procedure.  As noted, she  did have nausea and vomiting initially felt secondary to the contrast  dye.  This has resolved.  She had an abnormal chest x-ray during this  stay and we will follow up with her primary care physician, Dr. Drue Novel, for  further evaluation.  The patient has no complaints at this time, and she  and her husband believe she is doing well.   Dr. [name]  recommended continued aspirin and Plavix therapy.  He  recommends a followup evaluation in approximately 6 months.  He does not  feel a repeat angiogram will be indicated.  However, he is reluctant to  do a CT angiogram due to the patient's history of contrast dye allergy.  Dr. [name] stated he would give this some further thought to decide  what would be the proper imaging to follow the patient's stent from here  on out.  The patient and her husband were told to call if she has any  further symptoms.  All of their questions were answered.   Greater than 10 minutes was spent on this followup visit.      Delton See, P.A.    ______________________________  [name]. [name], [name]    DR/MEDQ  D:  02/12/2008  T:  02/13/2008  Job:  [id number]   cc:   [name], MD  Pramod P. Pearlean Brownie, MD  Arturo Morton. Riley Kill, MD, [facility]

## 2010-07-06 NOTE — Discharge Summary (Signed)
NAMESERITA, DEGROOTE NO.:  [id number]   MEDICAL RECORD NO.:  [id number]          PATIENT TYPE:  INP   LOCATION:  3015                         FACILITY:  [facility]   PHYSICIAN:  Barbette Hair. Artist Pais, DO      DATE OF BIRTH:  1932-12-07   DATE OF ADMISSION:  03/19/2007  DATE OF DISCHARGE:  03/24/2007                               DISCHARGE SUMMARY   DISCHARGE DIAGNOSES:  1. Right subacute pontine lacunar infarct.  2. Right basilar stenosis.  3. Right severe intrastent restenosis of right coronary artery.  4. Chronic systolic heart failure secondary to ischemic      cardiomyopathy, ejection fraction 25-30%.  5. Hypertension.  6. Diabetes mellitus type 2, uncontrolled.  7. Dyslipidemia.  8. History of porcelain gallbladder.  9. Mild elevation in troponins, nonspecific.   DISCHARGE MEDICATIONS:  1. Ramipril 5 mg once daily.  2. Metoprolol XL 100 mg once daily.  3. Plavix 75 mg once daily.  4. Glimepiride 4 mg daily before meals.  5. Potassium chloride 20 mEq daily.  6. Aspirin 81 mg daily.  7. Lipitor 20 mg daily.  8. Imdur 15 mg daily.   HOSPITAL COURSE:  Patient is a 77 year old white female with a past  medical history of coronary artery disease and peripheral arterial  disease who presented with right-sided weakness and numbness.  She had  been seen by Dr. [name] for right carotid stenosis in the past.  This  was previously stented.  She underwent CT of her head on admission.  It  showed subacute right pontine lacunar infarct.  Dr. Thad Ranger of  neurology was consulted.  A CT angiogram was subsequently ordered, which  showed severe proximal right ICA stenosis at the point of stent.  It  also shows 70% stenosis of the mid basilar artery.   Patient also had mild elevation of troponin and was seen by cardiology.  They felt elevated troponins were nonspecific and were not cardiac in  origin.   DISPOSITION:  The patient's numbness sensation has significantly  improved.  She was seen by Dr. [name], who planned on performing  procedure as an outpatient.  He planned to revascularize the right RCA  and follow the basilar artery stenosis conservatively.   DISCHARGE INSTRUCTIONS:  Patient was told to follow up with Dr. Drue Novel.  She is to call his office for an appointment within one week.  She  already has a follow-up appointment with Dr. [name].      Barbette Hair. Artist Pais, DO  Electronically Signed     RDY/MEDQ  D:  03/24/2007  T:  03/24/2007  Job:  [id number]   cc:   [name], MD

## 2010-07-06 NOTE — Letter (Signed)
June 12, 2007    [name], MD  [phone] [place], [place] [address]   RE:  [name], [name]  MRN:  [id number]  /  DOB:  1932-05-31   Dear Elita Quick:   I had the pleasure of seeing this nice patient, [name] [name], in the  office today in followup.  The patient clinically is doing well,  however, she has not shown up to see the surgeons.  There has been some  issue with her insurance and I have reiterated to her over and over  again about the need to get in to be seen.  We had received word from  the surgeons that she had not shown up for her visit.  She tells me that  she has rescheduled and is scheduled to be seen in June, although I  portrayed to her the absolute reasons that this needs to be done.   PHYSICAL EXAMINATION:  VITAL SIGNS:  Blood pressure 132/74, pulse 68.  LUNGS:  Clear.  CARDIAC:  Rhythm is regular.  There is an S4 gallop.  EXTREMITIES:  There is no extremity edema.   She told me that you had scheduled her for labs next week and that you  had been following her closely.  In that case, it is necessary for [place] to  see her and we will not see her for 4-6 months in followup, unless  further problems were to occur.  I again reiterated the need for her to  see the general surgeons and she said she clearly understands.  I will  see her back in followup at any time you feel necessary.  Fortunately,  she has had no recurrent cerebrovascular symptoms.    Sincerely,      Arturo Morton. Riley Kill, MD, [facility]  Electronically Signed    TDS/MedQ  DD: 06/12/2007  DT: 06/12/2007  Job #: [id number]

## 2010-07-09 NOTE — Discharge Summary (Signed)
[place]. [facility]  Patient:    [name] [name], [name] [name]                       MRN: [id number] Adm. Date:  [id number] Disc. Date: 08/17/00 Attending:  Waldo Laine Dictator:   Loura Pardon, P.A. CC:         Arturo Morton. Riley Kill, [name] [facility]   Discharge Summary  DISCHARGE DIAGNOSES:  1. Acute anterior wall myocardial infarction this admission.  2. Severe left ventricular dysfunction with left ventricular ejection     fraction of 20%.  3. Severe three-vessel atherosclerotic coronary artery disease.  4. Stabilized with intra-aortic balloon pump after left heart catheterization     this admission.  5. Acute blood loss anemia requiring transfusion: two units the day of     surgery intraoperatively; one unit on postop day #1, one unit postop day     #2, one unit postop day #5.  6. Volume overload after surgery treated with gentle continuous diuresis.  7. Adynamic ileus on postop day #2 through postop day #4 resolving     uneventfully.  8. Type 2 diabetes mellitus.  9. Hyperlipidemia. 10. History of myocardial infarction, status post several percutaneous     transluminal coronary angioplasties. 11. Episodic vertigo.  PROCEDURES: 1. On August 08, 2000, left heart catheterization, by Dr. Chales Abrahams.  The study    demonstrated that left main had a large caliber vessel with mild    irregularities.  The left anterior descending coronary artery has a 95-99%    proximal segment stenosis near the first septal perforator.  A stent was    noted in the mid left anterior descending immediately following the first    septal perforator with mild in-stent restenosis of 30%.  The distal left    anterior descending has a 30% diffuse disease.  Left circumflex has    high-grade narrowing of 80% between the first and second marginal branches.    Third marginal is totally occluded proximally.  The first and second    marginal branches have mild diffuse disease with 30%.  Ramus  intermediate    with medium caliber vessel occluded in the proximal segment.  A stent is    also noted in the proximal segment.  The distal segment is filled by way of    collaterals from the left anterior descending.  Right coronary artery is    dominant.  There is diffuse disease in the mid section of the right    coronary artery with narrowings of up to 70%.  Ejection fraction is    severely impaired with approximately 20% ejection fraction.  Akinesis of    the mid and distal anterior wall apex and mid and distal inferior walls.    There is trace mitral regurgitation.  The abdominal aortogram shows focal    eccentric narrowing 50% proximal segment of the right renal artery. 2. On August 08, 2000, coronary artery bypass graft surgery by Dr. Sheliah Plane.  In the surgical procedure, the left internal mammary artery was    connected in an end-to-side fashion to the left anterior descending    coronary artery.  Reverse saphenous vein graft was fashioned from the aorta    to the intermediate.  Reverse saphenous vein graft was fashioned from the    aorta to the second obtuse margin.  Reverse saphenous vein graft was    fashioned from the aorta to the right coronary  artery.  DISPOSITION:  [name] is ready for discharge on postoperative day #9. She has experienced no cardiac dysrhythmias or pulmonary compromise in the postoperative period.  She was relieved of all supplemental oxygen by postop day #3 and she has maintained sinus rhythm throughout the postoperative period.  Her sternotomy incision is healing well.  Her lower extremities are generally edematous and have continued to be edematous since surgery.  She was 36 pounds fluid overloaded on postop day #1 and has been maintained on a diuresis consistent with blood pressure control and avoidance of nephrotoxicity.  Her lower extremities have been slow to heal.  There has been copious serous and serosanguineous drainage from the  lower extremities especially at drain site which was placed intraoperatively.  This drainage has resolved quite largely by postop day #8.  Ms. Spake is ambulating independently.  She is taking oral nourishment and tolerating it well.  She has full GI tract function.  Her mental status had remained clear in the postoperative period.  DISCHARGE MEDICATIONS: 1. Percocet 5/325 mg one to two tablets p.o. q.4-6h. p.r.n. pain. 2. Antivert 25 mg b.i.d. 3. Enteric coated aspirin 325 mg daily. 4. Diabeta 5 mg p.o. b.i.d. 5. Lopressor 50 mg 1-1/2 tablet in the evening. 6. Lipitor 10 mg daily at bedtime. 7. Lasix 40 mg daily. 8. Potassium chloride 20 mEq dose to be determined. 9. Altace 2.5 mg p.o. b.i.d.  ACTIVITY:  Ambulate as much as possible build up strength.  She is asked not to lift more than 10 pounds nor to drive until she sees Dr. Tyrone Sage.  DIET:  Low sodium, low cholesterol diet.  WOUND CARE:  She may shower daily.  SPECIAL INSTRUCTIONS:  Home health will check on her incisions and do dressing changes.  FOLLOWUP:  She has a [facility]up appointment [facility] on Wednesday, June 26, from 9:15 a.m. to 11:30 a.m.  She is to call [phone], if she has any questions.  She will also follow up with Dr. Riley Kill two weeks after discharge.  He is her cardiologist.  She is to call [phone], to arrange the appointment.  A chest x-ray will be taken at that time.  She will also come to the office of [facility] in [place] for staple removal on Thursday, July 5, at 10 a.m.  She has an office visit with Dr. Tyrone Sage on Thursday, September 07, 2000, at 10:50 a.m.  HISTORY OF PRESENT ILLNESS:  [name] is a 77 year old female who developed substernal chest discomfort at 8 a.m. on June 17.  The pain radiated to both shoulders and both arms and lasted several hours.  She did arrive at the emergency room at [facility] and received  two to three  nitroglycerin with relief of her pain.  She was admitted to [facility].  On the evening of her admission, June 17, at approximately 10 p.m. or 11 p.m., she had recurrent chest pain.  Cardiac enzymes including serial troponin results returned elevated.  The patient was seen immediately by the cardiology service and taken to the catheterization lab for a left heart catheterization.  The patient was heparinized and the pain resolved. The patient denied shortness of breath, however, pain did return with minimal activity.  She has a history of a previous myocardial infarction with several angioplasties and placement of stents in both the left anterior descending coronary artery and the ramus intermedius.  After the left heart catheterization, she  was placed on an intra-aortic balloon pump for stabilization.  Dr. Tyrone Sage of [facility] saw Ms. Joelene Millin in consultation.  The risks and benefits of revascularization surgery were described and she accepted the risks and urgent coronary artery bypass graft was scheduled for June 18.  HOSPITAL COURSE:  After undergoing coronary artery bypass graft x 4 on June 18, Ms. Kniskern was transferred in stable condition to the intensive care unit with intra-aortic balloon pump at one to one.  She was also on a milrinone and dopamine drip as well as an insulin drip.  She was extubated on the day of surgery.  The intra-aortic balloon pump was also weaned on the day of surgery.  Her postoperative hemoglobin was 9.2.  This value fell through the day and she was transfused with one unit of packed red blood cells.  After extubation, she was achieving 98% oxygen saturations on 2 L of nasal cannula.  On postop day #3, the dopamine and milrinone were both successfully weaned.  The leg drains had diminished and were removed.  She was still maintained on insulin drip for elevated serum glucose.  Diuresis was  continued and she was transfused with a further unit of packed red blood cells for a hemoglobin of 8.5.  It was noted later in the day her abdomen was distended.  An abdominal x-ray film showed adynamic ileus.  She did not have right upper quadrant tenderness.  She was placed on clear liquids and the ileus resolved by postop day #4.  As her convalescence continued, gentle diuresis was effective at removing fluid.  She was continuously hypokalemic.  This was replenished daily.  She was relieved of all supplemental oxygen by postop day #3.  She had copious serosanguineous drainage from the left lower extremity which markedly had improved by postop day #8.  Dressing changes had been done on postop day #5 every two to three hours.  By postop day #8, they were being changed twice daily without excess drainage on the bandages.  The dressing changes will continue for Ms. Hemmer as an outpatient.  She did require some supplemental insulin injections amounting to 4-12 units of Humulin R through the day as she was maintained on an insulin sliding scale.  Her main hypoglycemic is Diabeta 5 mg b.i.d. and she currently has no primary care giver or endocrinologist to manage her type 2 diabetes.  It is strongly recommended that she consult with primary care giver for further management of her diabetes.  She has been given a one touch glucometer.  She has been seen by diabetes management personnel here.  She has also signed up for diabetes management classes as an outpatient.  It is hoped that she will keep a log of her serum glucose so she can present this to her primary care giver and he would be able to successfully adjust her serum glucose to keep the values under 150 mg/dl on a daily basis. DD:  08/16/00 TD:  08/17/00 Job: [id number]

## 2010-07-09 NOTE — Discharge Summary (Signed)
[place]. [facility]  Patient:    [name] [name], [name] [name]                       MRN: [id number] Adm. Date:  [id number] Disc. Date: [id number] Attending:  Ronaldo Miyamoto Dictator:   Lavella Hammock, P.A. CC:         Gwenith Daily. Tyrone Sage, [name]   Referring Physician Discharge [place]  DATE OF BIRTH:  November 16, 1933  PROCEDURES:  Chest x-ray.  HOSPITAL COURSE:  [name] is a 77 year old female who had bypass surgery on August 08, 2000.  She was discharged on August 17, 2000 and was placed on Augmentin as an outpatient.  After starting the Augmentin, she had onset of diarrhea, which was severe from August 26, 2000 to August 29, 2000.  Patient complained of weakness and had a BMET done at [facility] that showed a potassium of 2.3.  She was admitted for acute hypokalemia.  The patients potassium was supplemented and the diarrhea resolved once she was off the Augmentin.  C. difficile test was done and was negative.  An additional problem was hyperglycemia.  An endocrine consult was called to help with management of her blood sugars, which were greater than 200 at times.  He continued her on her glyburide but added Actos to her medication regimen.  He was satisfied with her diabetic control at this time and she is to follow up with her primary care physician.  By September 04, 2000, she no longer had any diarrhea and her potassium was 4.1.  She was considered stable for discharge on September 04, 2000 with a decreased diuretic and potassium dose and she is to follow up on these with a BMET later this week.  LABORATORY DATA:  Chest x-ray:  Persistent small bilateral effusions but these have decreased since August 13, 2000, no evidence of acute disease, status post CABG.  Laboratory values:  Hemoglobin 10.3, hematocrit 29.6, WBC 9.3, platelets 344,000.  Sodium 132, potassium 4.1, chloride 95, CO2 28, BUN 12, creatinine 0.7, glucose 150.  DISCHARGE CONDITION:   Improved.  CONSULTATIONS:  Dr. Veverly Fells. [place].  DISCHARGE DIAGNOSES:  1. Hypokalemia, secondary to diarrhea which was secondary to Augmentin.  2. History of aortocoronary bypass surgery on August 08, 2000, cardiac stable.  3. Ischemic cardiomyopathy with an ejection fraction of 20%.  4. Non-insulin-dependent diabetes mellitus, medications adjusted, this     admission.  5. Hyperlipidemia.  6. Mild hyponatremia.  7. Dehydration, resolved.  8. Acute anterior wall myocardial infarction on August 08, 2000.  9. Anemia secondary to blood loss. 10. Remote history of myocardial infarction and remote history of multiple     percutaneous transluminal coronary angioplasties. 11. History of episodic vertigo.  DISCHARGE INSTRUCTIONS:  1. Her activity level is to be as tolerated.  2. She is to stick to a low-fat diabetic diet.  3. She is to get a BMET this Thursday and she is to weigh herself daily and     bring that record to the office visit.  4. She is to see the P.A. for Dr. Arturo Morton. Stuckey on July 25th at 11:30.  DISCHARGE MEDICATIONS:  1. Lopressor 50 mg one-half tab b.i.d.  2. Glyburide 5 mg two times a day.  3. Zocor 20 mg q.d.  4. Actos 15 mg q.d.  5. Altace 2.5 mg, two tabs a.m. and one tab p.m.  6. Lasix 20 mg q.d., one-half of a 40  mg tablet.  7. K-Dur 20 mEq q.d.  8. Nitroglycerin 0.4 mg p.r.n.  SPECIAL DISCHARGE INSTRUCTIONS:  She is not to take Zaroxolyn for now. DD:  09/04/00 TD:  09/04/00 Job: [id number]

## 2010-07-09 NOTE — Cardiovascular Report (Signed)
[place]. [facility]  Patient:    [name] [name], [name] [name]                       MRN: [id number] Proc. Date: 08/08/00 Adm. Date:  [id number] Attending:  Glennon Hamilton CC:         Arturo Morton. Riley Kill, [name] [facility]  Theron Arista C. Eden Emms, [name] [facility]  Gwenith Daily. Tyrone Sage, [name]   Cardiac Catheterization  PROCEDURES PERFORMED: 1. Left heart catheterization. 2. Left ventriculogram. 3. Selective coronary angiography. 4. Left subclavian angiogram. 5. Abdominal aortogram.  DIAGNOSES: 1. Three-vessel coronary artery disease. 2. Severe left ventricular systolic dysfunction. 3. Acute anterior wall myocardial infarction.  INDICATIONS:  The patient is a 77 year old white female, with known history of coronary artery disease and diabetes mellitus, status post multiple percutaneous interventions, who presents with substernal chest discomfort. The patient was initially admitted to the hospital, but has had continued refractory chest pain, and elevation of cardiac enzymes consistent with myocardial injury.  She is brought to the catheterization lab urgently for further assessment.  TECHNIQUE:  After informed consent was obtained, the patient was brought to the cardiac catheterization lab and a 7 French sheath was placed in the right femoral artery.  Left heart catheterization and selective angiography were then performed in the usual fashion using preformed 6 French Judkins catheters.  The JR4 catheter was used to perform left subclavian angiography for opacification of the internal mammary artery and the pigtail catheter was used to perform abdominal aortogram.  At the termination of the case, the catheters were removed, the sheath was secured into position and the patient was kept in the holding area pending further decision regarding treatment options.  FINDINGS:  Findings are as follows: 1. Left main trunk:  The left main trunk is a large caliber vessel with    mild  irregularities. 2. LAD:  This is a medium caliber vessel that provides a bifurcating    large first septal perforator in the proximal segment.  There is several    small diagonal branches noted distally.  The LAD has a hazy filling    defect of 95-99% in the proximal segment near the first septal perforator.    A stent is noted in the mid LAD immediately following the first septal    perforator with mild in-stent re-stenosis of 30%.  The distal LAD has    mild diffuse disease of 30%.  There is TIMI-3 flow in the LAD. 3. Left circumflex artery:  This is a medium caliber vessel that provides    three marginal branches.  The AV circumflex has a high-grade narrowing of    80% between the first and second marginal branches.  The third marginal    branch is subtotally occluded in its proximal segment.  The first and    second marginal branches have mild diffuse disease of 30%.  The third    marginal branch has competitive flow noted distally. 4. Ramus intermedius:  This is a medium caliber vessel that is occluded in    its proximal segment.  A stent is also noted in its proximal segment    with no filling.  The distal segment of the vessel was seen to fill via    collaterals from the LAD. 5. Right coronary artery:  Right coronary artery is dominant.  This is a large    caliber vital signs that provides the posterior descending artery and two    posterior ventricular branches in  its terminal segment.  There is diffuse    disease in the mid section of the right coronary artery with narrowings    of up to 70%.  The distal branch vessels have mild irregularities.  LEFT VENTRICULOGRAM:  Dilated end-systolic and end-diastolic dimensions. Overall LV function is severely impaired.  Ejection fraction approximately 20%.  There is akinesis of the mid and distal anterior wall, apex and mid and distal inferior walls.  The basal segments work best and there is trace mitral regurgitation.  LV pressure is  125/20, aortic is 125/75.  LVEDP equals 38.  The left subclavian artery is patent with mild atheromatous buildup in the internal mammary artery.  This is a medium caliber vessel that tapers as it extends to the diaphragm.  ABDOMINAL AORTOGRAM:  Abdominal aorta is of normal caliber with mild atheromatous buildup.  No critical stenosis or dilatation is noted.  The renal arteries are single and patent bilaterally with narrowings of 30% in the proximal segment of the left renal artery and a focal eccentric narrowing of 50% in the proximal segment of the right renal artery.  The iliac arteries are of normal caliber and are patent bilaterally.  ASSESSMENT AND PLAN:  The patient is a 77 year old female with three-vessel coronary artery disease, severe left ventricular dysfunction involving anterior wall myocardial infarction.  She has a large amount of myocardium perfused by the left anterior descending including the anterior wall of the apex and the distal inferior wall.  Due to the extensive amount of myocardium at risk and multivessel disease, further assessment for bypass surgery will be made.  Continued medical therapy will be pursued pending this decision. DD:  08/08/00 TD:  08/08/00 Job: [id number]

## 2010-07-09 NOTE — Consult Note (Signed)
[place]. [facility]  Patient:    [name] [name], [name] [name]                       MRN: [id number] Proc. Date: 08/07/00 Adm. Date:  [id number] Attending:  Glennon Hamilton                          Consultation Report  REFERRING PHYSICIAN:  Noralyn Pick. Eden Emms, [name] and Veneda Melter, [name]  FOLLOW-UP CARDIOLOGIST:  Noralyn Pick. Eden Emms, [name]  PRIMARY CARE PHYSICIAN:  Unknown.  REASON FOR CONSULTATION:  Acute anterior myocardial infarction.  HISTORY OF PRESENT ILLNESS:  The patient is a 77 year old female who at 8 a.m. on June 17 developed substernal chest discomfort radiating into both shoulders and both arms lasting several hours. There was relief with two to three nitroglycerin upon her arrival in the emergency room. Initially, she stabilized. On the evening of June 17, at approximately 10 or 11 oclock had recurrent chest pain. At that time, the serial troponin results were returned with a troponin of 45. The patient was seen by cardiology service and taken to the catheterization lab for cardiac catheterization. At this point, the patient was heparinized, and the pain then resolved. The patient denied any shortness of breath. Initially, the pain came on with minimal activity. She does have a previous history of myocardial infarction and several angioplasties with numerous catheterizations from 1994 through 1999. Most recently, she had an angioplasty of the proximal LAD in 1999. She has known diabetes and hyperlipidemia. She denied any nausea or vomiting associated with the episode but did have diaphoresis. Cardiac risk factors include hypertension, hyperlipidemia, positive family history. She denied any previous stroke or TIAs. She does have type 2 diabetes. No hemoglobin A1C has been ordered or results known. She does not smoke. She denies shortness of breath and denies renal insufficiency.  PAST MEDICAL HISTORY:  In addition to the above, she also has episodic vertigo but  denies definite TIAs or amaurosis.  SOCIAL HISTORY:  The patient lives with her husband. Does not smoke or drink alcohol.  MEDICATIONS AT THE TIME OF ADMISSION: 1. Lopressor 50 b.i.d. 2. Glyburide 5 p.o. b.i.d. 3. One aspirin a day. 4. Meclizine 25 p.r.n. 5. Valium 5 mg p.r.n. 6. Lipitor 10 mg a day. 7. Imdur 30 mg a day.  DRUG ALLERGIES:  None known.  REVIEW OF SYSTEMS:  CARDIAC:  The patient noted chest pain as reviewed above. Denied resting shortness of breath or exertional shortness of breath. Denies orthopnea, syncope, or presyncope. Denies palpitations. Does have mild pedal edema. GENERAL:  Patient denies any fatigue or weight loss. RESPIRATORY:  She denies any hemoptysis. GI:  Denies any blood in her stool. Does have occasional epigastric burning sensation. NEUROLOGICAL:  She denies any amaurosis or TIAs. GU:  Denies any blood in her urine. Denies frequency or dysuria. Denies any recent infections. Denies any previous history of bleeding diathesis. Denies any thyroid disease. Does have type 2 diabetes controlled with oral agent. Denies any psychiatric history. Denies claudication.  PHYSICAL EXAMINATION:  VITAL SIGNS:  Blood pressure is 100/80, pulse is 70 and sinus, O2 saturation is 97% on 2 L.  GENERAL:  The patient is in the catheterization lab. The patient is neurologically intact, awake, and able to relay her history. Currently, she is not in any distress or having chest pain.  NECK:  Without palpable thyromegaly. She has no carotid bruits. She has  no jugular venous distention.  CHEST:  Clear breath sounds bilaterally.  CARDIAC:  Regular rhythm and rate without murmur or gallop.  ABDOMEN:  Moderately obese without palpable masses. The aorta is not palpably enlarged.  EXTREMITIES:  A femoral catheter is in the right femoral artery and vein. She has palpable DP and PT pulses bilaterally.  NEUROLOGICAL:  Grossly intact.  LYMPH:  The patient has no palpable  lymph nodes in the cervical or supraclavicular area.  SKIN:  Without ischemic changes in the lower extremities.  LABORATORY DATA:  Initial troponin of 0.8, which increased to 45. Initial MB was 2.7, which elevated to 70. Creatinine was 0.8. Hematocrit 41, platelet count 202.  EKG showed no acute changes initially, but a follow-up EKG did show an inferior ST elevation.  Cardiac catheterization films, both old films from 1998 and 1999, were reviewed and additional review of film done today. This patient has diffuse three-vessel disease and severe LV dysfunction with ejection fraction of 20%, with anteroapical and inferior hypokinesis. Previous ventriculogram showed a similar decrease in LV function but not to the degree that it is now. The patient has a hazy proximal 90% LAD lesion, probably acutely ruptured plaque. There is a totally occluded intermediate. The second obtuse marginal is subtotally occluded. The distal circumflex fills from collaterals from a large right coronary artery. The proximal two-thirds of the right coronary artery have diffuse disease as much as 60%. The distal vessel is large.  IMPRESSION:  Because of the patients unstable symptoms in the face of post infarctional angina, intra-aortic balloon pump was going to be placed to stabilize the patient and proceed with coronary artery bypass grafting in the early a.m. The risks and options, including death, infection, stroke, myocardial infarction, bleeding, blood transfusion all discussed with the patient and her family in detail. She is willing to proceed. We will start insulin drip to stabilize glucose prior to surgery and stabilize her with intra-aortic balloon pump. DD:  08/08/00 TD:  08/08/00 Job: [id number]

## 2010-07-09 NOTE — Consult Note (Signed)
[place]. [facility]  Patient:    [name] [name], [name] [name]                       MRN: [id number] Proc. Date: 09/01/00 Adm. Date:  [id number] Attending:  Ronaldo Miyamoto CC:         Arturo Morton. Riley Kill, [name] [facility]  Gwenith Daily. Tyrone Sage, [name]   Consultation Report  REASON FOR CONSULTATION:  Management of diabetes.  HISTORY:  This 77 year old Caucasian female has had diabetes since about 16. Patient states that she has been managed with oral agents, primarily Diabeta, during this period of time.  Patient has not had any diabetes education and does not have a specific meal plan to follow.  However, in the recent few months she had been trying to reduce her calories and was able to lose a little weight.  She currently weighs 197 pounds.  Patient states that even with the Diabeta b.i.d. her blood sugars in the recent few months have been in the upper 100s and not consistent.  She never had episodes of hypoglycemia.  Since she had her bypass surgery about two weeks ago she says her blood sugars had been somewhat higher and right after discharge was as high as 300.  However, they have been varying between normal and low 200s at different times.  The patient has generally not been able to exercise because of her cardiac problems.  PAST MEDICAL HISTORY:  Coronary bypass surgery in June 2001, otherwise no significant past history.  SOCIAL HISTORY:  She is a nonsmoker.  Lives with her husband at home.  FAMILY HISTORY:  Negative for diabetes or coronary disease.  REVIEW OF SYSTEMS:  Patient says that she has had mild hypertension. Currently she is on Lopressor and low dose Altace.  She also had swelling of her legs after surgery for which she had been on Lasix and Zaroxolyn.  She is now admitted for severe hypokalemia and potassium of 125.  She has had no renal problems.  Her renal function is normal.  She has been to an eye doctor every year and has not been told to  have retinopathy.  She has not had any TIA or history of CVA.  She currently has a low ejection fraction after her MI and cardiac evaluation, but not clear if ejection fraction had been rechecked after surgery.  Patient complains of numbness in her feet and toes, especially after recent bypass surgery.  She was having marked swelling of her legs after surgery for which she was put on diuretics.  She also complains of severe diarrhea about three to four days which is slightly better now.  Previously she was having constipation.  The last few days she had been feeling weak and tired.  PHYSICAL EXAMINATION  GENERAL:  Patient is very pleasant and cooperative.  She is alert and in no acute distress.  VITAL SIGNS:  Pulse and blood pressure as per nursing but they are in the normal range.  There is no significant pallor.  HEENT:  Eyes are normal.  Pupils equal.  Fundi not examined at this time.  ENT examination shows dry mucous membranes with slightly beefy red tongue.  NECK:  Shows no thyroid enlargement or carotid bruits.  No lymphadenopathy.  HEART:  Sounds are normal and regular.  LUNGS:  Clear.  ABDOMEN:  No distention, mass, or tenderness.  EXTREMITIES:  No edema.  She has healing scars of her vein grafting which look  slightly erythematous, but no infection seen.  Pedal pulses are absent. Popliteal pulses are present.  She has absent ankle jerks.  Pin sensation is normal in toes.  ASSESSMENT:  Patient has somewhat poor control of her diabetes with fluctuating blood sugars and hyperglycemia, particularly after stress of surgery.  She also has other complicated medical problems including hyperlipidemia, coronary disease, mild peripheral neuropathy.  Her diarrhea may have been secondary to the Augmentin she was taking for cellulitis in her legs.  RECOMMENDATIONS:  Start Actos 15 mg q.d. for better control and also for potential cardiovascular benefits in the long run.  Augmentin  has been stopped and C. difficile toxin ordered.  Patient will need to be followed up in the office in two weeks.DD:  09/01/00 TD:  09/01/00 Job: [id number]

## 2010-07-09 NOTE — Op Note (Signed)
[place]. [facility]  Patient:    [name] [name], [name] [name]                      MRN: [id number] Proc. Date: 08/08/00 Attending:  Gwenith Daily. Tyrone Sage, [name] CC:         Noralyn Pick. Eden Emms, [name] [facility]   Operative Report  PREOPERATIVE DIAGNOSIS:  Acute myocardial infarction and post infarction angina with severe left ventricular dysfunction.  POSTOPERATIVE DIAGNOSIS:  Acute myocardial infarction and post infarction angina with severe left ventricular dysfunction.  OPERATION:  Urgent coronary artery bypass grafting x 4 with left internal mammary to the left anterior descending coronary artery, reverse saphenous vein graft to the intermediate coronary artery, reverse saphenous vein graft to the second obtuse marginal coronary artery, reverse saphenous vein graft to the distal right coronary artery.  SURGEON:  Gwenith Daily. Tyrone Sage, [name]  FIRST ASSISTANT:  Carlye Grippe.  BRIEF HISTORY:  The patient is a 77 year old female with known previous coronary artery disease having had several angioplasties in the past.  She had onset of chest pain approximately 24 hours prior to surgery.  The pain was stuttering in nature, or relieved with nitroglycerin and then returned. CK-MBs and troponin were markedly elevated with evidence of two myocardial infarctions.  The patient underwent cardiac catheterization urgently, and intra-aortic balloon pump was placed.  Coronary artery bypass grafting was recommended.  The patient agreed and signed informed consent.  DESCRIPTION OF PROCEDURE:  With Swan-Ganz arterial monitors in place, the patient underwent general endotracheal anesthesia without incident.  The skin of the chest and legs were prepped with Betadine and draped in the usual sterile manner.   Prior to surgery in the catheterization lab, an intra-aortic balloon pump had been placed into the right groin.  Using a [employer] endoscopic vein harvesting system, a segment of vein was  harvested from the right thigh. Additional vein was taken through incision below and beneath.  Two portions of this vein were used.  The most distal portion was too small.  An additional vein was harvested from the right lower extremity.  A median sternotomy was performed.  The left internal mammary artery was dissected down as a pedicle graft.  The distal artery was divided, had good free flow.  The pericardium was opened.  The patient had obvious evidence of acute anterior myocardial infarction with hypokinesis, but the muscle itself did not appear particularly thinned.  The patient was systemically heparinized.  The ascending aorta and the right atrium were cannulated in the aortic root vent.   Cardioplegia needle was introduced into the ascending aorta.  The patient was placed on cardiopulmonary bypass, 2.4 liters per minute per meter squared.  Sites of anastomosis were selected and dissected out of the epicardium.  The patients body temperature was cooled to 30 degrees.  Aortic cross-clamp was applied, and 500 cc of cold blood and cardioplegia was administered with rapid diastolic arrest of the heart.  Myocardial septal temperature was monitored throughout the crossclamp.  Attention was turned first to the distal right coronary artery which was opened and was a large vessel and of good quality.  Using a running 7-0 Prolene, distal anastomosis was performed in the segment of reverse saphenous vein graft.  Attention was then turned to the distal circumflex.  This vessel was very small and not suitable for bypass because of its small size and diffuse disease.  The second obtuse marginal coronary artery was identified, and it was  suitable for bypass.  In addition, a totally occluded intermediate coronary artery was identified and also selected for bypass.  The patients body temperature was cooled to 30 degrees.  Aortic crossclamp was applied, and 500 cc of cold blood and cardioplegia was  administered with rapid diastolic arrest of the heart.  Myocardial septal temperature was monitored throughout the crossclamp.  Attention was turned first to the distal right coronary artery which was opened and was of excellent quality.  A second reverse saphenous vein graft was anastomosed to the distal right coronary artery with a running 7-0 Prolene.  Additional cold blood cardioplegia was administered down the vein graft.  Attention was then turned to the second obtuse marginal which was a much smaller vessel, barely admitting a 1 mm probe and diffusely diseased. Using a running 7-0 Prolene, distal anastomosis was performed with a segment of saphenous vein graft.  The most distal branch of the circumflex was totally calcified and of small size and not bypassed.  The intermediate coronary artery was opened and admitted a 1 mm probe.  Using a running 7-0 Prolene, distal anastomosis was performed.  Attention was then turned to the left anterior descending coronary artery which admitted a 1.5 mm probe distally.  Using a running 8-0 Prolene, the left internal mammary artery was anastomosed to the left anterior descending coronary artery.    With release of the bulldog on the mammary artery, there was appropriate rise in myocardial and septal temperature.  The aortic cross-clamp was removed.  Total cross-clamp time was 67 minutes.  The patient spontaneously converted to a sinus rhythm.  A partial occlusion clamp was placed on the ascending aorta.  Three punch irotomies were performed.  Each of the vein grafts were anastomosed to the ascending aorta.  The air was evacuated from the grafts, and the partial occlusion clamp was removed.  Sites of anastomoses were inspected and were free of bleeding.  The patient was then ventilated and weaned from cardiopulmonary bypass on dopamine and milrinone infusion.  She remained hemodynamically stable with intra-aortic balloon pump support.  She was  decannulated in the usual fashion.  Protamine sulfate was administered.  With operative field hemostatic, two atrial and two ventricular  pacing wires were applied.  Graft marker was applied.  Left pleural tube and two mediastinal tubes were left in place.  Sternum was closed with No. 6 stainless steel wire.  Fascia was closed with interrupted 0 Vicryl, running 3-0 Vicryl in subcutaneous tissue, 4-0 subcuticular stitch in skin edges.  Dry dressings were applied.  The patient was transferred to surgical intensive care unit for further postoperative care. DD:  08/08/00 TD:  08/08/00 Job: [id number]

## 2010-07-12 ENCOUNTER — Encounter: Payer: Self-pay | Admitting: Internal Medicine

## 2010-07-20 ENCOUNTER — Encounter: Payer: Self-pay | Admitting: Internal Medicine

## 2010-07-21 ENCOUNTER — Other Ambulatory Visit: Payer: Self-pay | Admitting: Internal Medicine

## 2010-07-28 ENCOUNTER — Other Ambulatory Visit: Payer: Self-pay | Admitting: Cardiology

## 2010-07-29 ENCOUNTER — Encounter: Payer: Self-pay | Admitting: Internal Medicine

## 2010-07-30 ENCOUNTER — Encounter: Payer: Self-pay | Admitting: Internal Medicine

## 2010-08-11 ENCOUNTER — Other Ambulatory Visit: Payer: Self-pay | Admitting: Internal Medicine

## 2010-08-18 ENCOUNTER — Other Ambulatory Visit: Payer: Self-pay | Admitting: *Deleted

## 2010-08-18 MED ORDER — CLOPIDOGREL BISULFATE 75 MG PO TABS: 75.0000 mg | ORAL_TABLET | Freq: Every day | ORAL | Status: DC

## 2010-08-20 ENCOUNTER — Encounter: Payer: Self-pay | Admitting: Cardiology

## 2010-08-20 ENCOUNTER — Ambulatory Visit (INDEPENDENT_AMBULATORY_CARE_PROVIDER_SITE_OTHER): Payer: Medicare Other | Admitting: Cardiology

## 2010-08-20 VITALS — BP 122/84 | HR 62 | Resp 18 | Ht 68.0 in | Wt 199.1 lb

## 2010-08-20 DIAGNOSIS — I6529 Occlusion and stenosis of unspecified carotid artery: Secondary | ICD-10-CM

## 2010-08-20 DIAGNOSIS — E785 Hyperlipidemia, unspecified: Secondary | ICD-10-CM

## 2010-08-20 DIAGNOSIS — I2510 Atherosclerotic heart disease of native coronary artery without angina pectoris: Secondary | ICD-10-CM

## 2010-08-20 DIAGNOSIS — I2589 Other forms of chronic ischemic heart disease: Secondary | ICD-10-CM

## 2010-08-20 NOTE — Patient Instructions (Signed)
Follow up in 6 months with Dr Riley Kill.  You will receive a letter in the mail 2 months before you are due.  Please call us when you receive this letter to schedule your follow up appointment. Please continue medications as listed

## 2010-09-05 DIAGNOSIS — I2510 Atherosclerotic heart disease of native coronary artery without angina pectoris: Secondary | ICD-10-CM | POA: Insufficient documentation

## 2010-09-05 NOTE — Assessment & Plan Note (Signed)
Has ICD.  Overall stable.  On ACE, and beta blockers.  Hemodynamics are stable.  No change in meds.

## 2010-09-05 NOTE — Assessment & Plan Note (Signed)
She has had prior bypass and is stable.  Last cath in 2008 is noted in [place].  Please see for more information. No current angina, so continue medical therapy at this point in time.

## 2010-09-05 NOTE — Assessment & Plan Note (Signed)
Last LDL 54mg /dl.

## 2010-09-05 NOTE — Progress Notes (Signed)
HPI:  [name] [name] is actually doing quite well.  She denies chest pain and is not having shortness of breath.  She tolerates her medications pretty well.  I think she is on a higher dose of ASA related to her CNS issues.  She remains on DAPT.  She has an ICD for sudden death prevention.  Her past history is incorrect in that she did not have repeat cardiac stenting, but rather carotid stenting.  She has been stable from a cardiac standpoint.      No Known Allergies  Past Medical History  Diagnosis Date  . Diabetes mellitus   . Hyperlipidemia   . Hypertension   . CAD (coronary artery disease)     s/p stent R side 9/08, re stenosis 1/09, s/p angioplasty 1/09 and a stent on 12/09; re-angioplasty 5/11  . CHF (congestive heart failure)   . Diverticulitis 12/08  . 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 Hx of CABG 08/08/00    LIMA to LAD, SVG to intermediate, SVG to OM2, SVG to distal RCA  . Barrett esophagus     EGD 08/2009, next 2013  . Carotid arterial disease     s/p stent R side 9-08, re stenosis 1-09, s/p angioplasty 1-09 and a stent on 12-09, last  angiogram 06-24-2009  . Abnormal CT scan, chest     last 06-08-2009, see report   . Cardiac device in situ, other      12-08-- St. Jude Single Chamber     Past Surgical History  Procedure Date  . Arterial bypass surgry 1999  . Coronary artery bypass graft   . Cardiac defibrillator placement     st judes    Family History  Problem Relation Age of Onset  . Breast cancer Mother 77  . Cancer Mother     breast cancer  . Colon cancer Neg Hx     History   Social History  . Marital Status: Widowed    Spouse Name: N/A    Number of Children: 6  . Years of Education: N/A   Occupational History  . Not on file.   Social History Main Topics  . Smoking status: Never Smoker   . Smokeless tobacco: Not on file  . Alcohol Use: Not on file  . Drug Use: Not on file  . Sexually Active: Not on file   Other Topics Concern  . Not on file   Social  History Narrative   Lives w/ husbandStill drivesIndependent on all ADL    ROS: Please see the HPI.  All other systems reviewed and negative.  PHYSICAL EXAM:  BP 122/84  Pulse 62  Resp 18  Ht 5\' 8"  (1.727 m)  Wt 199 lb 1.9 oz (90.32 kg)  BMI 30.28 kg/m2  General: Well developed, well nourished, in no acute distress. Head:  Normocephalic and atraumatic. Neck: no JVD Lungs: Clear to auscultation and percussion. Heart: Normal S1 and S2.  No murmur, rubs or gallops. No definite murmur Abdomen:  Normal bowel sounds; soft; non tender; no organomegaly Pulses: Pulses normal in all 4 extremities. Extremities: No clubbing or cyanosis. No edema. Neurologic: Alert and oriented x 3.  EKG: Non specific ST and T abnormalities, occasional PVCs.  ASSESSMENT AND PLAN:

## 2010-09-05 NOTE — Assessment & Plan Note (Signed)
Seen by Dr.Devashwar.  Several procedures in the CNS system in past.  Had several things dilated.  On DAPT related to her CNS disease mainly.  Will defer to Dr. Drue Novel.  Started back on iron.

## 2010-09-13 ENCOUNTER — Other Ambulatory Visit: Payer: Self-pay | Admitting: Internal Medicine

## 2010-09-22 ENCOUNTER — Other Ambulatory Visit: Payer: Self-pay | Admitting: Cardiology

## 2010-10-11 ENCOUNTER — Other Ambulatory Visit: Payer: Self-pay | Admitting: Cardiology

## 2010-11-10 LAB — CBC
HCT: 29.1 — ABNORMAL LOW
HCT: 29.8 — ABNORMAL LOW
Hemoglobin: 9.9 — ABNORMAL LOW
MCHC: 33.4
MCV: 82
Platelets: 149 — ABNORMAL LOW
RDW: 15
WBC: 9.9

## 2010-11-10 LAB — HEPATIC FUNCTION PANEL
Bilirubin, Direct: 0.3
Indirect Bilirubin: 0.1 — ABNORMAL LOW
Total Protein: 5.6 — ABNORMAL LOW

## 2010-11-10 LAB — BASIC METABOLIC PANEL
BUN: 8
Calcium: 8.5
Creatinine, Ser: 0.95
GFR calc non Af Amer: 58 — ABNORMAL LOW
Glucose, Bld: 145 — ABNORMAL HIGH
Potassium: 3.4 — ABNORMAL LOW

## 2010-11-10 LAB — DIFFERENTIAL
Basophils Absolute: 0
Eosinophils Relative: 3
Lymphocytes Relative: 13
Lymphs Abs: 1.3
Monocytes Absolute: 1.1 — ABNORMAL HIGH
Neutro Abs: 7.3

## 2010-11-11 LAB — URINALYSIS, ROUTINE W REFLEX MICROSCOPIC
Bilirubin Urine: NEGATIVE
Ketones, ur: NEGATIVE
Nitrite: NEGATIVE
Protein, ur: NEGATIVE
pH: 7.5

## 2010-11-11 LAB — BASIC METABOLIC PANEL
BUN: 19
CO2: 23
Chloride: 97
Creatinine, Ser: 1.04
GFR calc Af Amer: >60
Potassium: 4.5

## 2010-11-11 LAB — LIPID PANEL
Cholesterol: 160
HDL: 36 — ABNORMAL LOW
Total CHOL/HDL Ratio: 4.4
Total CHOL/HDL Ratio: 4.9
Triglycerides: 168 — ABNORMAL HIGH
VLDL: 47 — ABNORMAL HIGH

## 2010-11-11 LAB — POCT CARDIAC MARKERS
CKMB, poc: 3.2
Myoglobin, poc: 79
Operator id: 234501
Troponin i, poc: 0.4 — ABNORMAL HIGH

## 2010-11-11 LAB — CK TOTAL AND CKMB (NOT AT ARMC)
CK, MB: 2.9
Relative Index: INVALID
Relative Index: INVALID

## 2010-11-11 LAB — HEMOGLOBIN A1C: Hgb A1c MFr Bld: 7.9 — ABNORMAL HIGH

## 2010-11-11 LAB — PROTIME-INR: Prothrombin Time: 13.6

## 2010-11-11 LAB — DIFFERENTIAL
Eosinophils Absolute: 0.2
Lymphocytes Relative: 23
Lymphs Abs: 2.9
Monocytes Relative: 7
Neutro Abs: 8.7 — ABNORMAL HIGH
Neutrophils Relative %: 68

## 2010-11-11 LAB — LIPASE, BLOOD: Lipase: 22

## 2010-11-11 LAB — CBC
HCT: 37
Hemoglobin: 13.3
MCHC: 33.1
MCHC: 33.3
MCV: 81.2
MCV: 81.6
RBC: 4.55
RBC: 4.93
RDW: 15.6 — ABNORMAL HIGH
WBC: 11.4 — ABNORMAL HIGH

## 2010-11-11 LAB — TROPONIN I: Troponin I: 0.53

## 2010-11-11 LAB — COMPREHENSIVE METABOLIC PANEL
BUN: 15
CO2: 29
Calcium: 9.9
Creatinine, Ser: 0.82
GFR calc non Af Amer: >60
Glucose, Bld: 169 — ABNORMAL HIGH
Total Protein: 7.1

## 2010-11-11 LAB — URINE MICROSCOPIC-ADD ON

## 2010-11-12 LAB — CBC
HCT: 30.7 — ABNORMAL LOW
HCT: 37.3
Hemoglobin: 10.6 — ABNORMAL LOW
Hemoglobin: 12.6
RBC: 3.77 — ABNORMAL LOW
RBC: 4.59
RDW: 16.8 — ABNORMAL HIGH
WBC: 15 — ABNORMAL HIGH
WBC: 18.9 — ABNORMAL HIGH

## 2010-11-12 LAB — BASIC METABOLIC PANEL
GFR calc Af Amer: >60
GFR calc non Af Amer: 55 — ABNORMAL LOW
GFR calc non Af Amer: >60
Glucose, Bld: 147 — ABNORMAL HIGH
Potassium: 4
Potassium: 4.2
Sodium: 135
Sodium: 139

## 2010-11-12 LAB — DIFFERENTIAL
Eosinophils Relative: 2
Lymphocytes Relative: 22
Lymphs Abs: 3.3
Monocytes Absolute: 0.9
Monocytes Relative: 6

## 2010-11-12 LAB — APTT
aPTT: 45 — ABNORMAL HIGH
aPTT: 62 — ABNORMAL HIGH

## 2010-11-12 LAB — POCT I-STAT GLUCOSE
Glucose, Bld: 290 — ABNORMAL HIGH
Operator id: 132841
Operator id: 136451

## 2010-11-12 LAB — PROTIME-INR
INR: 0.9
Prothrombin Time: 12.5

## 2010-11-23 LAB — CBC
HCT: 36.7
MCV: 84.8
RBC: 4.33
WBC: 11.1 — ABNORMAL HIGH

## 2010-11-23 LAB — BASIC METABOLIC PANEL
Chloride: 99
GFR calc Af Amer: 58 — ABNORMAL LOW
Potassium: 4.2
Sodium: 136

## 2010-11-23 LAB — GLUCOSE, CAPILLARY
Glucose-Capillary: 225 — ABNORMAL HIGH
Glucose-Capillary: 263 — ABNORMAL HIGH
Glucose-Capillary: 305 — ABNORMAL HIGH

## 2010-11-25 LAB — CBC
HCT: 29.3 % — ABNORMAL LOW (ref 36.0–46.0)
HCT: 29.5 % — ABNORMAL LOW (ref 36.0–46.0)
Hemoglobin: 9.6 g/dL — ABNORMAL LOW (ref 12.0–15.0)
Hemoglobin: 9.8 g/dL — ABNORMAL LOW (ref 12.0–15.0)
MCHC: 33.3 g/dL (ref 30.0–36.0)
MCHC: 33.3 g/dL (ref 30.0–36.0)
MCHC: 34.1 g/dL (ref 30.0–36.0)
MCV: 85 fL (ref 78.0–100.0)
MCV: 85.7 fL (ref 78.0–100.0)
Platelets: 205 10*3/uL (ref 150–400)
Platelets: 205 10*3/uL (ref 150–400)
Platelets: 221 10*3/uL (ref 150–400)
Platelets: 224 10*3/uL (ref 150–400)
RBC: 3.42 MIL/uL — ABNORMAL LOW (ref 3.87–5.11)
RBC: 3.99 MIL/uL (ref 3.87–5.11)
RDW: 14.5 % (ref 11.5–15.5)
RDW: 14.6 % (ref 11.5–15.5)
WBC: 11.1 10*3/uL — ABNORMAL HIGH (ref 4.0–10.5)
WBC: 20.3 10*3/uL — ABNORMAL HIGH (ref 4.0–10.5)

## 2010-11-25 LAB — HEPATIC FUNCTION PANEL
ALT: 15 U/L (ref 0–35)
Albumin: 3.1 g/dL — ABNORMAL LOW (ref 3.5–5.2)
Alkaline Phosphatase: 98 U/L (ref 39–117)
Total Protein: 5.8 g/dL — ABNORMAL LOW (ref 6.0–8.3)

## 2010-11-25 LAB — POCT I-STAT GLUCOSE
Glucose, Bld: 190 mg/dL — ABNORMAL HIGH (ref 70–99)
Glucose, Bld: 271 mg/dL — ABNORMAL HIGH (ref 70–99)
Operator id: 177011

## 2010-11-25 LAB — GLUCOSE, CAPILLARY
Glucose-Capillary: 140 mg/dL — ABNORMAL HIGH (ref 70–99)
Glucose-Capillary: 144 mg/dL — ABNORMAL HIGH (ref 70–99)
Glucose-Capillary: 144 mg/dL — ABNORMAL HIGH (ref 70–99)
Glucose-Capillary: 155 mg/dL — ABNORMAL HIGH (ref 70–99)
Glucose-Capillary: 162 mg/dL — ABNORMAL HIGH (ref 70–99)
Glucose-Capillary: 193 mg/dL — ABNORMAL HIGH (ref 70–99)
Glucose-Capillary: 322 mg/dL — ABNORMAL HIGH (ref 70–99)
Glucose-Capillary: 96 mg/dL (ref 70–99)

## 2010-11-25 LAB — BASIC METABOLIC PANEL
BUN: 17 mg/dL (ref 6–23)
BUN: 20 mg/dL (ref 6–23)
CO2: 23 mEq/L (ref 19–32)
CO2: 27 mEq/L (ref 19–32)
Calcium: 8.4 mg/dL (ref 8.4–10.5)
Calcium: 9.5 mg/dL (ref 8.4–10.5)
Chloride: 110 mEq/L (ref 96–112)
Creatinine, Ser: 0.84 mg/dL (ref 0.4–1.2)
Creatinine, Ser: 0.96 mg/dL (ref 0.4–1.2)
Creatinine, Ser: 1.01 mg/dL (ref 0.4–1.2)
GFR calc Af Amer: >60 mL/min (ref >60)
GFR calc Af Amer: >60 mL/min (ref >60)
GFR calc non Af Amer: 54 mL/min — ABNORMAL LOW (ref >60)
Glucose, Bld: 141 mg/dL — ABNORMAL HIGH (ref 70–99)
Potassium: 3.6 mEq/L (ref 3.5–5.1)

## 2010-11-25 LAB — URINALYSIS, ROUTINE W REFLEX MICROSCOPIC
Bilirubin Urine: NEGATIVE
Hgb urine dipstick: NEGATIVE
Protein, ur: NEGATIVE mg/dL
Urobilinogen, UA: 0.2 mg/dL (ref 0.0–1.0)

## 2010-11-25 LAB — URINE CULTURE

## 2010-11-25 LAB — DIFFERENTIAL
Basophils Relative: 0 % (ref 0–1)
Eosinophils Absolute: 0 10*3/uL (ref 0.0–0.7)
Eosinophils Relative: 0 % (ref 0–5)
Lymphs Abs: 2 10*3/uL (ref 0.7–4.0)
Monocytes Relative: 6 % (ref 3–12)
Monocytes Relative: 7 % (ref 3–12)
Neutro Abs: 7.5 10*3/uL (ref 1.7–7.7)
Neutrophils Relative %: 68 % (ref 43–77)

## 2010-11-25 LAB — PROTIME-INR
INR: 1 (ref 0.00–1.49)
Prothrombin Time: 12.8 seconds (ref 11.6–15.2)

## 2010-11-25 LAB — APTT: aPTT: 29 seconds (ref 24–37)

## 2010-11-25 LAB — HEPARIN LEVEL (UNFRACTIONATED): Heparin Unfractionated: 0.27 IU/mL — ABNORMAL LOW (ref 0.30–0.70)

## 2010-11-26 LAB — COMPREHENSIVE METABOLIC PANEL
ALT: 39 — ABNORMAL HIGH
AST: 83 — ABNORMAL HIGH
Calcium: 9.1
Creatinine, Ser: 0.93
GFR calc Af Amer: >60
Sodium: 142
Total Protein: 6.3

## 2010-11-26 LAB — CBC
Hemoglobin: 10.6 — ABNORMAL LOW
MCHC: 32.8
MCHC: 32.9
MCV: 82.6
Platelets: 167
RBC: 3.86 — ABNORMAL LOW
RDW: 14.3
WBC: 19.8 — ABNORMAL HIGH

## 2010-11-26 LAB — DIFFERENTIAL
Eosinophils Absolute: 0
Eosinophils Relative: 0
Lymphocytes Relative: 2 — ABNORMAL LOW
Lymphs Abs: 0.3 — ABNORMAL LOW
Monocytes Relative: 2 — ABNORMAL LOW

## 2010-11-26 LAB — URINALYSIS, ROUTINE W REFLEX MICROSCOPIC
Bilirubin Urine: NEGATIVE
Hgb urine dipstick: NEGATIVE
Ketones, ur: NEGATIVE
Nitrite: NEGATIVE
Protein, ur: NEGATIVE
Specific Gravity, Urine: 1.015
Urobilinogen, UA: 0.2

## 2010-11-26 LAB — BASIC METABOLIC PANEL
CO2: 27
Calcium: 8.4
Chloride: 102
Creatinine, Ser: 0.89
GFR calc Af Amer: >60
Sodium: 135

## 2010-11-26 LAB — PROTIME-INR
INR: 1
Prothrombin Time: 13.8

## 2010-11-26 LAB — HEMOGLOBIN A1C: Mean Plasma Glucose: 165

## 2010-11-29 LAB — CBC
HCT: 34.5 — ABNORMAL LOW
Hemoglobin: 11.8 — ABNORMAL LOW
MCHC: 34.2
MCV: 81.7
RBC: 4.23
RDW: 14.4

## 2010-11-29 LAB — BASIC METABOLIC PANEL
BUN: 11
CO2: 27
CO2: 29
Chloride: 100
Chloride: 104
GFR calc Af Amer: >60
GFR calc non Af Amer: >60
Glucose, Bld: 115 — ABNORMAL HIGH
Glucose, Bld: 238 — ABNORMAL HIGH
Potassium: 4
Sodium: 136
Sodium: 139

## 2010-11-29 LAB — PROTIME-INR: INR: 1

## 2010-12-02 LAB — BASIC METABOLIC PANEL
BUN: 12
CO2: 27
CO2: 28
CO2: 31
CO2: 32
Chloride: 100
Chloride: 109
GFR calc Af Amer: 55 — ABNORMAL LOW
GFR calc non Af Amer: >60
Glucose, Bld: 145 — ABNORMAL HIGH
Glucose, Bld: 151 — ABNORMAL HIGH
Glucose, Bld: 222 — ABNORMAL HIGH
Potassium: 3.6
Potassium: 3.7
Potassium: 4
Potassium: 4.1
Sodium: 140
Sodium: 141
Sodium: 142

## 2010-12-02 LAB — CBC
HCT: 31.3 — ABNORMAL LOW
HCT: 32.8 — ABNORMAL LOW
HCT: 36.6
Hemoglobin: 12.5
MCHC: 33.5
MCHC: 33.6
MCHC: 33.8
MCHC: 34
MCV: 85
MCV: 85.4
Platelets: 139 — ABNORMAL LOW
Platelets: 157
RDW: 14.2 — ABNORMAL HIGH
RDW: 14.3 — ABNORMAL HIGH
WBC: 19.6 — ABNORMAL HIGH
WBC: 22 — ABNORMAL HIGH

## 2010-12-02 LAB — APTT
aPTT: 26
aPTT: 41 — ABNORMAL HIGH

## 2010-12-02 LAB — PROTIME-INR
INR: 0.9
Prothrombin Time: 12.3

## 2010-12-02 LAB — URINALYSIS, ROUTINE W REFLEX MICROSCOPIC
Bilirubin Urine: NEGATIVE
Bilirubin Urine: NEGATIVE
Glucose, UA: 100 — AB
Nitrite: NEGATIVE
Nitrite: NEGATIVE
Protein, ur: NEGATIVE
Specific Gravity, Urine: 1.014
Urobilinogen, UA: 0.2
pH: 5.5

## 2010-12-02 LAB — DIFFERENTIAL
Basophils Absolute: 0
Basophils Relative: 0
Lymphocytes Relative: 20
Neutro Abs: 9.7 — ABNORMAL HIGH
Neutrophils Relative %: 71

## 2010-12-02 LAB — URINE MICROSCOPIC-ADD ON

## 2010-12-08 ENCOUNTER — Other Ambulatory Visit: Payer: Self-pay

## 2010-12-08 MED ORDER — ATORVASTATIN CALCIUM 40 MG PO TABS: 40.0000 mg | ORAL_TABLET | Freq: Every day | ORAL | Status: DC

## 2011-01-12 ENCOUNTER — Other Ambulatory Visit: Payer: Self-pay | Admitting: Cardiology

## 2011-01-12 ENCOUNTER — Other Ambulatory Visit: Payer: Self-pay | Admitting: Internal Medicine

## 2011-01-31 ENCOUNTER — Telehealth: Payer: Self-pay | Admitting: Internal Medicine

## 2011-02-02 NOTE — Telephone Encounter (Signed)
I have the paperwork, she needs a  office visit, please arrange

## 2011-02-16 ENCOUNTER — Other Ambulatory Visit: Payer: Self-pay | Admitting: Internal Medicine

## 2011-02-17 ENCOUNTER — Ambulatory Visit: Payer: Medicare Other | Admitting: Internal Medicine

## 2011-02-21 ENCOUNTER — Encounter: Payer: Self-pay | Admitting: Internal Medicine

## 2011-02-21 ENCOUNTER — Ambulatory Visit (INDEPENDENT_AMBULATORY_CARE_PROVIDER_SITE_OTHER): Payer: Medicare Other | Admitting: Internal Medicine

## 2011-02-21 VITALS — BP 128/70 | HR 64 | Temp 98.1°F | Wt 202.2 lb

## 2011-02-21 DIAGNOSIS — E119 Type 2 diabetes mellitus without complications: Secondary | ICD-10-CM

## 2011-02-21 DIAGNOSIS — Z23 Encounter for immunization: Secondary | ICD-10-CM

## 2011-02-21 DIAGNOSIS — I2510 Atherosclerotic heart disease of native coronary artery without angina pectoris: Secondary | ICD-10-CM

## 2011-02-21 DIAGNOSIS — I10 Essential (primary) hypertension: Secondary | ICD-10-CM

## 2011-02-21 DIAGNOSIS — D509 Iron deficiency anemia, unspecified: Secondary | ICD-10-CM

## 2011-02-21 LAB — BASIC METABOLIC PANEL
CO2: 23 mEq/L (ref 19–32)
Calcium: 9.6 mg/dL (ref 8.4–10.5)
Creat: 0.83 mg/dL (ref 0.50–1.10)
Glucose, Bld: 82 mg/dL (ref 70–99)

## 2011-02-21 MED ORDER — NITROGLYCERIN 0.4 MG SL SUBL: 0.4000 mg | SUBLINGUAL_TABLET | SUBLINGUAL | Status: DC | PRN

## 2011-02-21 NOTE — Progress Notes (Signed)
  Subjective:    Patient ID: [name] [name], female    DOB: 1932-12-01, 77 y.o.   MRN: [id number]  HPI ROV Diabetes--good medication compliance, no symptoms consistent with hypoglycemia, CBGs usually 170 in the morning High cholesterol, good medication compliance Heart disease, good medication compliance, needs a refill on nitroglycerin sublingual. Hypertension, taking medications without apparent side effects, no ambulatory BPs.   Past Medical History: Diabetes mellitus, type II Hyperlipidemia Hypertension -------------------------------------------------- CV: CAD, s/p CABG DEFIBRILLATOR IMPLANT 12-08-- St. Jude Single Chamber  CVA 10-2006 Carotid Artery Dz: s/p stent R side 9-08, re stenosis 1-09, s/p angioplasty 1-09 and a stent on 12-09;  re-angioplasty   5-11 Congestive heart failure --------------------------------------------------- Diverticulitis 12-08 Porcelain GB Dx 12-08 Barrett's esophagus by EGD 08-2009, next 2013    Past Surgical History: CABG 08/08/2000  LIMA to LAD, SVG to intermediate, SVG to OM2, SVG to distal RCA St Judes Cardiac Defibrillator implanted  Social History: Married Non smoker six children lives w/ husband still drives  independent on all ADL  Review of Systems Denies chest pain or shortness of breath No nausea, vomiting, diarrhea    Objective:   Physical Exam  Constitutional: She appears well-developed. No distress.  Cardiovascular: Normal rate, regular rhythm and normal heart sounds.   No murmur heard. Pulmonary/Chest: Effort normal and breath sounds normal. No respiratory distress. She has no wheezes. She has no rales.  Musculoskeletal:       Left leg slightly larger than right since CABG, stable  Neurological: She is alert.  Skin: She is not diaphoretic.  Psychiatric: She has a normal mood and affect.      Assessment & Plan:

## 2011-02-21 NOTE — Assessment & Plan Note (Addendum)
Was rec iron supplements base on last CBC, did not take d/t constipation Labs

## 2011-02-21 NOTE — Assessment & Plan Note (Signed)
Due for labs She is unhappy with her diabetes testing supply company, they call her "all the time" Recommend to get her supplies from a local vendor Check CBGs once or twice a dat

## 2011-02-21 NOTE — Patient Instructions (Signed)
Eck your sugar twice a day Call if problems

## 2011-02-21 NOTE — Assessment & Plan Note (Addendum)
No change, occasional cramp in her legs, we'll check a BMP

## 2011-02-21 NOTE — Assessment & Plan Note (Signed)
Seems stable Chronic edema since CABG, unchanged Needs a RF on NTG

## 2011-02-22 ENCOUNTER — Encounter: Payer: Self-pay | Admitting: Internal Medicine

## 2011-02-22 LAB — HEMOGLOBIN: Hemoglobin: 11.4 g/dL — ABNORMAL LOW (ref 12.0–15.0)

## 2011-02-24 ENCOUNTER — Telehealth: Payer: Self-pay

## 2011-02-24 MED ORDER — POTASSIUM CHLORIDE CRYS ER 10 MEQ PO TBCR: 10.0000 meq | EXTENDED_RELEASE_TABLET | Freq: Every day | ORAL | Status: DC

## 2011-02-24 NOTE — Telephone Encounter (Signed)
Message copied by Court Joy on Thu Feb 24, 2011  3:12 PM ------      Message from: [name] E      Created: Thu Feb 24, 2011 11:12 AM       Advise patient or patient's family:      Diabetes well-controlled      Potassium elevated, decrease  KCL from 20 mEq to  10 meq a day. Call in a new prescription----> BMP in 2 weeks      Also still has  anemia, in the past Iron caused constipation, we can try Fergon (ferrous gluconate) 300 1 po bid

## 2011-03-02 ENCOUNTER — Ambulatory Visit (INDEPENDENT_AMBULATORY_CARE_PROVIDER_SITE_OTHER): Payer: Medicare Other | Admitting: Cardiology

## 2011-03-02 ENCOUNTER — Encounter: Payer: Self-pay | Admitting: Cardiology

## 2011-03-02 DIAGNOSIS — I10 Essential (primary) hypertension: Secondary | ICD-10-CM

## 2011-03-02 DIAGNOSIS — I5022 Chronic systolic (congestive) heart failure: Secondary | ICD-10-CM

## 2011-03-02 DIAGNOSIS — I2510 Atherosclerotic heart disease of native coronary artery without angina pectoris: Secondary | ICD-10-CM

## 2011-03-02 DIAGNOSIS — I2589 Other forms of chronic ischemic heart disease: Secondary | ICD-10-CM

## 2011-03-02 NOTE — Patient Instructions (Signed)
Your physician recommends that you have lab work today:  BMP  Your physician wants you to follow-up in: 6 MONTHS.  You will receive a reminder letter in the mail two months in advance. If you don't receive a letter, please call our office to schedule the follow-up appointment.  Your physician recommends that you continue on your current medications as directed. Please refer to the Current Medication list given to you today.  

## 2011-03-02 NOTE — Progress Notes (Signed)
HPI:  Stable overall.  Uses walker and cane.  No def shortness of breath.  Seems to be holding her own.    Current Outpatient Prescriptions  Medication Sig Dispense Refill  . ALPRAZolam (XANAX) 0.25 MG tablet 1 or 2 at bedtime as needed for insomnia.  60 tablet  3  . aspirin 325 MG tablet Take 325 mg by mouth daily.        Marland Kitchen atorvastatin (LIPITOR) 40 MG tablet Take 1 tablet (40 mg total) by mouth daily.  30 tablet  3  . clopidogrel (PLAVIX) 75 MG tablet Take 1 tablet (75 mg total) by mouth daily.  30 tablet  11  . famotidine (PEPCID) 40 MG tablet Take 1 tablet (40 mg total) by mouth every evening.  30 tablet  11  . FeFum-FePoly-FA-B Cmp-C-Biot (INTEGRA PLUS) CAPS 1 by mouth daily.  30 capsule  3  . furosemide (LASIX) 40 MG tablet take 1 tablet by mouth once daily  30 tablet  4  . glimepiride (AMARYL) 4 MG tablet take 1 tablet by mouth every morning BEFORE BREAKFAST.  30 tablet  1  . glucose blood (ONE TOUCH ULTRA TEST) test strip 1 each by Other route as needed. Use as instructed       . isosorbide dinitrate (ISORDIL) 30 MG tablet 1/2 tablet two times a day.       . metFORMIN (GLUCOPHAGE) 500 MG tablet Take 1 tablet (500 mg total) by mouth 2 (two) times daily with a meal.      . metoprolol (LOPRESSOR) 50 MG tablet Take 50 mg by mouth 2 (two) times daily.        . nitroGLYCERIN (NITROSTAT) 0.4 MG SL tablet Place 1 tablet (0.4 mg total) under the tongue every 5 (five) minutes as needed for chest pain.  50 tablet  3  . potassium chloride (K-DUR,KLOR-CON) 10 MEQ tablet Take 10 mEq by mouth daily.      . ramipril (ALTACE) 5 MG capsule take 1 capsule by mouth twice a day  60 capsule  4    No Known Allergies  Past Medical History  Diagnosis Date  . Diabetes mellitus   . Hyperlipidemia   . Hypertension   . CAD (coronary artery disease)     s/p stent R side 9/08, re stenosis 1/09, s/p angioplasty 1/09 and a stent on 12/09; re-angioplasty 5/11  . CHF (congestive heart failure)   .  Diverticulitis 12/08  . 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 Hx of CABG 08/08/00    LIMA to LAD, SVG to intermediate, SVG to OM2, SVG to distal RCA  . Barrett esophagus     EGD 08/2009, next 2013  . Carotid arterial disease     s/p stent R side 9-08, re stenosis 1-09, s/p angioplasty 1-09 and a stent on 12-09, last  angiogram 06-24-2009  . Abnormal CT scan, chest     last 06-08-2009, see report   . Cardiac device in situ, other      12-08-- St. Jude Single Chamber     Past Surgical History  Procedure Date  . Arterial bypass surgry 1999  . Coronary artery bypass graft   . Cardiac defibrillator placement     st judes    Family History  Problem Relation Age of Onset  . Breast cancer Mother 65  . Cancer Mother     breast cancer  . Colon cancer Neg Hx     History   Social History  . Marital Status:  Widowed    Spouse Name: N/A    Number of Children: 6  . Years of Education: N/A   Occupational History  . Not on file.   Social History Main Topics  . Smoking status: Never Smoker   . Smokeless tobacco: Not on file  . Alcohol Use: Not on file  . Drug Use: Not on file  . Sexually Active: Not on file   Other Topics Concern  . Not on file   Social History Narrative   Lives w/ husbandStill drivesIndependent on all ADL    ROS: Please see the HPI.  All other systems reviewed and negative.  PHYSICAL EXAM:  BP 140/78  Pulse 64  Wt 91.354 kg (201 lb 6.4 oz)  General: Well developed, well nourished, in no acute distress. Head:  Normocephalic and atraumatic. Neck: no JVD Lungs: Clear to auscultation and percussion. Heart: Normal S1 and S2.  No murmur, rubs or gallops.  Pulses: Pulses normal in all 4 extremities. Extremities: No clubbing or cyanosis. No edema. Neurologic: Alert and oriented x 3.  EKG:   NSR.  TWI in I, AVL.  Prob from long standing LVH.  No change from baseline tracing.   ASSESSMENT AND PLAN:

## 2011-03-03 LAB — BASIC METABOLIC PANEL
BUN: 19 mg/dL (ref 6–23)
CO2: 27 mEq/L (ref 19–32)
Chloride: 101 mEq/L (ref 96–112)
Creatinine, Ser: 1.1 mg/dL (ref 0.4–1.2)

## 2011-03-07 ENCOUNTER — Encounter: Payer: Self-pay | Admitting: *Deleted

## 2011-03-14 ENCOUNTER — Other Ambulatory Visit: Payer: Self-pay | Admitting: Internal Medicine

## 2011-03-14 MED ORDER — FERROUS GLUCONATE 324 (38 FE) MG PO TABS: 324.0000 mg | ORAL_TABLET | Freq: Every day | ORAL | Status: DC

## 2011-03-14 NOTE — Progress Notes (Signed)
Iron prescriptions sent today

## 2011-03-15 ENCOUNTER — Other Ambulatory Visit: Payer: Self-pay | Admitting: Cardiology

## 2011-03-15 ENCOUNTER — Other Ambulatory Visit: Payer: Self-pay | Admitting: Internal Medicine

## 2011-03-15 NOTE — Assessment & Plan Note (Signed)
Pretty well controlled at this point.

## 2011-03-15 NOTE — Telephone Encounter (Signed)
Done

## 2011-03-15 NOTE — Assessment & Plan Note (Signed)
Remains stable from this standpoint.  Not very active.  On appropriate therapies minus aldactone, but given issues with follow up in past, and labs would defer for concern regarding potential for hyperkalemia.  She is on ACE, beta blockers, and diuretics, and maintaining.  Will check bmet, and probably repeat echocardiogram again in about 6 months when she returns.

## 2011-03-20 NOTE — Assessment & Plan Note (Signed)
No recurrent symptoms.  Stable at this time.

## 2011-04-11 ENCOUNTER — Other Ambulatory Visit: Payer: Self-pay | Admitting: Cardiology

## 2011-04-21 ENCOUNTER — Telehealth: Payer: Self-pay | Admitting: Internal Medicine

## 2011-04-21 MED ORDER — METFORMIN HCL 500 MG PO TABS: 500.0000 mg | ORAL_TABLET | Freq: Two times a day (BID) | ORAL | Status: DC

## 2011-04-21 NOTE — Telephone Encounter (Signed)
MetFORMIN HCl (Tab) GLUCOPHAGE 500 MG Qty 60

## 2011-04-21 NOTE — Telephone Encounter (Signed)
Refill done.  

## 2011-05-16 ENCOUNTER — Other Ambulatory Visit: Payer: Self-pay | Admitting: Internal Medicine

## 2011-05-16 NOTE — Telephone Encounter (Signed)
Refill done.  

## 2011-06-01 ENCOUNTER — Encounter: Payer: Self-pay | Admitting: *Deleted

## 2011-06-02 ENCOUNTER — Encounter: Payer: Self-pay | Admitting: Internal Medicine

## 2011-06-14 ENCOUNTER — Other Ambulatory Visit: Payer: Self-pay | Admitting: Cardiology

## 2011-08-24 ENCOUNTER — Other Ambulatory Visit: Payer: Self-pay | Admitting: Cardiology

## 2011-08-24 ENCOUNTER — Other Ambulatory Visit: Payer: Self-pay | Admitting: Internal Medicine

## 2011-08-24 NOTE — Telephone Encounter (Signed)
Refill done.  

## 2011-08-31 ENCOUNTER — Other Ambulatory Visit: Payer: Self-pay | Admitting: Cardiology

## 2011-08-31 ENCOUNTER — Telehealth (HOSPITAL_COMMUNITY): Payer: Self-pay

## 2011-08-31 NOTE — Telephone Encounter (Signed)
Left message for Mrs. Kocak to call in regards to her f/u angio needed with Dr. [name].

## 2011-08-31 NOTE — Telephone Encounter (Signed)
I spoke with Mrs. [name] [name] in regards to her f/u  She stated tht she was doing well.  She also stated that she would need to call me back in the next few days after she discussed things with her family.

## 2011-11-15 ENCOUNTER — Telehealth (HOSPITAL_COMMUNITY): Payer: Self-pay

## 2011-11-15 ENCOUNTER — Other Ambulatory Visit: Payer: Self-pay | Admitting: Cardiology

## 2011-11-15 NOTE — Telephone Encounter (Signed)
Mrs. Gunner called.  She stated that she is having teeth removed.  Her dentist has requested a letter stating it is ok for her to come off of her Plavix for 5 days prior.  Dr. [name] will review the chart and advise.

## 2011-11-16 ENCOUNTER — Other Ambulatory Visit: Payer: Self-pay | Admitting: *Deleted

## 2011-11-16 MED ORDER — ATORVASTATIN CALCIUM 40 MG PO TABS: 40.0000 mg | ORAL_TABLET | Freq: Every day | ORAL | Status: DC

## 2011-11-25 ENCOUNTER — Other Ambulatory Visit: Payer: Self-pay | Admitting: Internal Medicine

## 2011-11-25 NOTE — Telephone Encounter (Signed)
Refill done.  

## 2011-12-14 ENCOUNTER — Other Ambulatory Visit: Payer: Self-pay | Admitting: Internal Medicine

## 2011-12-14 NOTE — Telephone Encounter (Signed)
Refill done.  

## 2011-12-16 DIAGNOSIS — R21 Rash and other nonspecific skin eruption: Secondary | ICD-10-CM | POA: Diagnosis not present

## 2012-01-11 ENCOUNTER — Other Ambulatory Visit: Payer: Self-pay | Admitting: *Deleted

## 2012-01-11 MED ORDER — RAMIPRIL 5 MG PO CAPS: 5.0000 mg | ORAL_CAPSULE | Freq: Two times a day (BID) | ORAL | Status: DC

## 2012-01-20 ENCOUNTER — Other Ambulatory Visit: Payer: Self-pay | Admitting: Internal Medicine

## 2012-01-20 ENCOUNTER — Encounter: Payer: Self-pay | Admitting: *Deleted

## 2012-01-20 NOTE — Telephone Encounter (Signed)
Refill done.  

## 2012-02-17 ENCOUNTER — Other Ambulatory Visit: Payer: Self-pay | Admitting: Internal Medicine

## 2012-02-17 NOTE — Telephone Encounter (Signed)
Pt has not been seen within a year.  Last refill request letter was mailed to notify pt she was due for an OV.  OK to refill?

## 2012-02-17 NOTE — Telephone Encounter (Signed)
Call patient,  schedule a 30 minute-office visit within 30 days. Overbook okay. Refill metformin for 30 days. Be sure patient is aware ---> no further refills without office visit.

## 2012-02-17 NOTE — Telephone Encounter (Signed)
lmovm for pt to return call.  

## 2012-02-20 ENCOUNTER — Encounter: Payer: Self-pay | Admitting: *Deleted

## 2012-02-20 NOTE — Telephone Encounter (Signed)
Refill done.  Letter mailed to pt to notify her she is due for an office visit.  

## 2012-03-03 ENCOUNTER — Other Ambulatory Visit: Payer: Self-pay | Admitting: Internal Medicine

## 2012-03-05 MED ORDER — GLIMEPIRIDE 4 MG PO TABS: 4.0000 mg | ORAL_TABLET | Freq: Every day | ORAL | Status: DC

## 2012-03-05 NOTE — Telephone Encounter (Signed)
Discussed with pt & she states she will check with her daugther to see when she can bring her & will then call back tomorrow to schedule an appt.  Refill done.

## 2012-03-05 NOTE — Telephone Encounter (Signed)
Ok #30, no RF (if no appointment by the end of 02-2012, no further RF)

## 2012-03-05 NOTE — Telephone Encounter (Signed)
Pt has not been seen within a year. A letter was mailed to pt 12.30.13 to notify pt she needs to call the office & schedule an OV. OK to refill?

## 2012-03-21 ENCOUNTER — Other Ambulatory Visit: Payer: Self-pay | Admitting: *Deleted

## 2012-03-21 MED ORDER — FUROSEMIDE 40 MG PO TABS: 40.0000 mg | ORAL_TABLET | Freq: Every day | ORAL | Status: DC

## 2012-04-11 ENCOUNTER — Telehealth: Payer: Self-pay | Admitting: Internal Medicine

## 2012-04-11 ENCOUNTER — Encounter: Payer: Self-pay | Admitting: Internal Medicine

## 2012-04-11 ENCOUNTER — Inpatient Hospital Stay (HOSPITAL_COMMUNITY): Payer: Medicare Other

## 2012-04-11 ENCOUNTER — Ambulatory Visit (INDEPENDENT_AMBULATORY_CARE_PROVIDER_SITE_OTHER): Payer: Medicare Other | Admitting: Internal Medicine

## 2012-04-11 ENCOUNTER — Encounter (HOSPITAL_COMMUNITY): Payer: Self-pay | Admitting: Internal Medicine

## 2012-04-11 ENCOUNTER — Inpatient Hospital Stay (HOSPITAL_COMMUNITY)
Admission: AD | Admit: 2012-04-11 | Discharge: 2012-04-13 | DRG: 638 | Disposition: A | Discharge status: Home / Self Care | Payer: Medicare Other | Source: Ambulatory Visit | Attending: Internal Medicine | Admitting: Internal Medicine

## 2012-04-11 VITALS — BP 120/72 | HR 68 | Temp 98.2°F | Wt 192.0 lb

## 2012-04-11 DIAGNOSIS — I5022 Chronic systolic (congestive) heart failure: Secondary | ICD-10-CM | POA: Diagnosis not present

## 2012-04-11 DIAGNOSIS — E1169 Type 2 diabetes mellitus with other specified complication: Secondary | ICD-10-CM | POA: Diagnosis present

## 2012-04-11 DIAGNOSIS — Z23 Encounter for immunization: Secondary | ICD-10-CM | POA: Diagnosis not present

## 2012-04-11 DIAGNOSIS — M7989 Other specified soft tissue disorders: Secondary | ICD-10-CM | POA: Diagnosis not present

## 2012-04-11 DIAGNOSIS — E1142 Type 2 diabetes mellitus with diabetic polyneuropathy: Secondary | ICD-10-CM | POA: Diagnosis present

## 2012-04-11 DIAGNOSIS — L0390 Cellulitis, unspecified: Secondary | ICD-10-CM | POA: Diagnosis not present

## 2012-04-11 DIAGNOSIS — E785 Hyperlipidemia, unspecified: Secondary | ICD-10-CM | POA: Diagnosis present

## 2012-04-11 DIAGNOSIS — E119 Type 2 diabetes mellitus without complications: Secondary | ICD-10-CM

## 2012-04-11 DIAGNOSIS — I10 Essential (primary) hypertension: Secondary | ICD-10-CM | POA: Diagnosis not present

## 2012-04-11 DIAGNOSIS — Z66 Do not resuscitate: Secondary | ICD-10-CM | POA: Diagnosis present

## 2012-04-11 DIAGNOSIS — I2510 Atherosclerotic heart disease of native coronary artery without angina pectoris: Secondary | ICD-10-CM | POA: Diagnosis present

## 2012-04-11 DIAGNOSIS — L97509 Non-pressure chronic ulcer of other part of unspecified foot with unspecified severity: Secondary | ICD-10-CM

## 2012-04-11 DIAGNOSIS — I509 Heart failure, unspecified: Secondary | ICD-10-CM | POA: Diagnosis present

## 2012-04-11 DIAGNOSIS — I2589 Other forms of chronic ischemic heart disease: Secondary | ICD-10-CM | POA: Diagnosis present

## 2012-04-11 DIAGNOSIS — L97529 Non-pressure chronic ulcer of other part of left foot with unspecified severity: Secondary | ICD-10-CM

## 2012-04-11 DIAGNOSIS — L02619 Cutaneous abscess of unspecified foot: Secondary | ICD-10-CM | POA: Diagnosis present

## 2012-04-11 DIAGNOSIS — Z95810 Presence of automatic (implantable) cardiac defibrillator: Secondary | ICD-10-CM

## 2012-04-11 DIAGNOSIS — Z951 Presence of aortocoronary bypass graft: Secondary | ICD-10-CM | POA: Diagnosis not present

## 2012-04-11 DIAGNOSIS — Z7982 Long term (current) use of aspirin: Secondary | ICD-10-CM | POA: Diagnosis not present

## 2012-04-11 DIAGNOSIS — Z79899 Other long term (current) drug therapy: Secondary | ICD-10-CM | POA: Diagnosis not present

## 2012-04-11 DIAGNOSIS — L0291 Cutaneous abscess, unspecified: Secondary | ICD-10-CM

## 2012-04-11 DIAGNOSIS — E11621 Type 2 diabetes mellitus with foot ulcer: Secondary | ICD-10-CM | POA: Diagnosis present

## 2012-04-11 HISTORY — DX: Systemic involvement of connective tissue, unspecified: M35.9

## 2012-04-11 HISTORY — DX: Presence of automatic (implantable) cardiac defibrillator: Z95.810

## 2012-04-11 HISTORY — DX: Cerebral infarction, unspecified: I63.9

## 2012-04-11 LAB — CBC WITH DIFFERENTIAL/PLATELET
Basophils Relative: 0 % (ref 0–1)
Eosinophils Absolute: 0.3 10*3/uL (ref 0.0–0.7)
Eosinophils Relative: 3 % (ref 0–5)
MCH: 26.3 pg (ref 26.0–34.0)
MCHC: 32 g/dL (ref 30.0–36.0)
MCV: 82.3 fL (ref 78.0–100.0)
Monocytes Relative: 8 % (ref 3–12)
Neutrophils Relative %: 74 % (ref 43–77)
Platelets: 247 10*3/uL (ref 150–400)

## 2012-04-11 LAB — GLUCOSE, CAPILLARY: Glucose-Capillary: 122 mg/dL — ABNORMAL HIGH (ref 70–99)

## 2012-04-11 LAB — COMPREHENSIVE METABOLIC PANEL
ALT: 9 U/L (ref 0–35)
AST: 18 U/L (ref 0–37)
Albumin: 3.3 g/dL — ABNORMAL LOW (ref 3.5–5.2)
CO2: 24 mEq/L (ref 19–32)
Chloride: 98 mEq/L (ref 96–112)
GFR calc non Af Amer: 54 mL/min — ABNORMAL LOW (ref >90)
Potassium: 4.7 mEq/L (ref 3.5–5.1)
Sodium: 137 mEq/L (ref 135–145)
Total Bilirubin: 0.2 mg/dL — ABNORMAL LOW (ref 0.3–1.2)

## 2012-04-11 MED ORDER — GLIMEPIRIDE 4 MG PO TABS
4.0000 mg | ORAL_TABLET | Freq: Every day | ORAL | Status: DC
  Administered 2012-04-12 – 2012-04-13 (×2): 4 mg via ORAL
  Filled 2012-04-11 (×3): qty 1

## 2012-04-11 MED ORDER — HYDROCODONE-ACETAMINOPHEN 5-325 MG PO TABS: 1.0000 | ORAL_TABLET | ORAL | Status: DC | PRN

## 2012-04-11 MED ORDER — DEXTROSE 5 % IV SOLN
1.0000 g | Freq: Three times a day (TID) | INTRAVENOUS | Status: DC
  Administered 2012-04-12 – 2012-04-13 (×4): 1 g via INTRAVENOUS
  Filled 2012-04-11 (×7): qty 1

## 2012-04-11 MED ORDER — ONDANSETRON HCL 4 MG PO TABS: 4.0000 mg | ORAL_TABLET | Freq: Four times a day (QID) | ORAL | Status: DC | PRN

## 2012-04-11 MED ORDER — CLOPIDOGREL BISULFATE 75 MG PO TABS
75.0000 mg | ORAL_TABLET | Freq: Every day | ORAL | Status: DC
  Administered 2012-04-12: 75 mg via ORAL
  Filled 2012-04-11 (×2): qty 1

## 2012-04-11 MED ORDER — ACETAMINOPHEN 325 MG PO TABS: 650.0000 mg | ORAL_TABLET | Freq: Four times a day (QID) | ORAL | Status: DC | PRN

## 2012-04-11 MED ORDER — INSULIN ASPART 100 UNIT/ML ~~LOC~~ SOLN: 0.0000 [IU] | Freq: Every day | SUBCUTANEOUS | Status: DC

## 2012-04-11 MED ORDER — VANCOMYCIN HCL 1000 MG IV SOLR
750.0000 mg | Freq: Two times a day (BID) | INTRAVENOUS | Status: DC
  Administered 2012-04-12 – 2012-04-13 (×3): 750 mg via INTRAVENOUS
  Filled 2012-04-11 (×4): qty 750

## 2012-04-11 MED ORDER — FAMOTIDINE 40 MG PO TABS
40.0000 mg | ORAL_TABLET | Freq: Every evening | ORAL | Status: DC
  Administered 2012-04-11 – 2012-04-12 (×2): 40 mg via ORAL
  Filled 2012-04-11 (×3): qty 1

## 2012-04-11 MED ORDER — ATORVASTATIN CALCIUM 40 MG PO TABS
40.0000 mg | ORAL_TABLET | Freq: Every day | ORAL | Status: DC
  Administered 2012-04-11 – 2012-04-13 (×3): 40 mg via ORAL
  Filled 2012-04-11 (×3): qty 1

## 2012-04-11 MED ORDER — INSULIN ASPART 100 UNIT/ML ~~LOC~~ SOLN
0.0000 [IU] | Freq: Three times a day (TID) | SUBCUTANEOUS | Status: DC
  Administered 2012-04-12: 2 [IU] via SUBCUTANEOUS
  Administered 2012-04-12: 1 [IU] via SUBCUTANEOUS
  Administered 2012-04-13: 2 [IU] via SUBCUTANEOUS

## 2012-04-11 MED ORDER — HEPARIN SODIUM (PORCINE) 5000 UNIT/ML IJ SOLN
5000.0000 [IU] | Freq: Three times a day (TID) | INTRAMUSCULAR | Status: DC
  Administered 2012-04-11 – 2012-04-13 (×6): 5000 [IU] via SUBCUTANEOUS
  Filled 2012-04-11 (×9): qty 1

## 2012-04-11 MED ORDER — INFLUENZA VIRUS VACC SPLIT PF IM SUSP
0.5000 mL | INTRAMUSCULAR | Status: AC
  Administered 2012-04-12: 0.5 mL via INTRAMUSCULAR
  Filled 2012-04-11: qty 0.5

## 2012-04-11 MED ORDER — ONDANSETRON HCL 4 MG/2ML IJ SOLN: 4.0000 mg | Freq: Four times a day (QID) | INTRAMUSCULAR | Status: DC | PRN

## 2012-04-11 MED ORDER — ACETAMINOPHEN 650 MG RE SUPP: 650.0000 mg | Freq: Four times a day (QID) | RECTAL | Status: DC | PRN

## 2012-04-11 MED ORDER — SODIUM CHLORIDE 0.9 % IV SOLN: 250.0000 mL | INTRAVENOUS | Status: DC | PRN

## 2012-04-11 MED ORDER — ISOSORBIDE DINITRATE 10 MG PO TABS
10.0000 mg | ORAL_TABLET | Freq: Two times a day (BID) | ORAL | Status: DC
  Administered 2012-04-11 – 2012-04-13 (×4): 10 mg via ORAL
  Filled 2012-04-11 (×5): qty 1

## 2012-04-11 MED ORDER — POTASSIUM CHLORIDE CRYS ER 20 MEQ PO TBCR
20.0000 meq | EXTENDED_RELEASE_TABLET | Freq: Every day | ORAL | Status: DC
  Administered 2012-04-11 – 2012-04-13 (×3): 20 meq via ORAL
  Filled 2012-04-11 (×3): qty 1

## 2012-04-11 MED ORDER — CEFTAZIDIME 1 G IJ SOLR
1.0000 g | Freq: Three times a day (TID) | INTRAMUSCULAR | Status: DC
  Administered 2012-04-11: 1 g via INTRAVENOUS
  Filled 2012-04-11 (×3): qty 1

## 2012-04-11 MED ORDER — ASPIRIN 325 MG PO TABS
325.0000 mg | ORAL_TABLET | Freq: Every day | ORAL | Status: DC
  Administered 2012-04-11 – 2012-04-13 (×3): 325 mg via ORAL
  Filled 2012-04-11 (×3): qty 1

## 2012-04-11 MED ORDER — SODIUM CHLORIDE 0.9 % IJ SOLN
3.0000 mL | Freq: Two times a day (BID) | INTRAMUSCULAR | Status: DC
  Administered 2012-04-11 – 2012-04-12 (×3): 3 mL via INTRAVENOUS

## 2012-04-11 MED ORDER — METOPROLOL TARTRATE 50 MG PO TABS
50.0000 mg | ORAL_TABLET | Freq: Two times a day (BID) | ORAL | Status: DC
  Administered 2012-04-11 – 2012-04-13 (×4): 50 mg via ORAL
  Filled 2012-04-11 (×5): qty 1

## 2012-04-11 MED ORDER — FUROSEMIDE 40 MG PO TABS
40.0000 mg | ORAL_TABLET | Freq: Two times a day (BID) | ORAL | Status: DC
  Administered 2012-04-11 – 2012-04-12 (×2): 40 mg via ORAL
  Filled 2012-04-11 (×4): qty 1

## 2012-04-11 MED ORDER — RAMIPRIL 5 MG PO CAPS
5.0000 mg | ORAL_CAPSULE | Freq: Two times a day (BID) | ORAL | Status: DC
  Administered 2012-04-11 – 2012-04-13 (×4): 5 mg via ORAL
  Filled 2012-04-11 (×5): qty 1

## 2012-04-11 MED ORDER — SODIUM CHLORIDE 0.9 % IJ SOLN: 3.0000 mL | INTRAMUSCULAR | Status: DC | PRN

## 2012-04-11 MED ORDER — POLYETHYLENE GLYCOL 3350 17 G PO PACK
17.0000 g | PACK | Freq: Every day | ORAL | Status: DC | PRN
  Administered 2012-04-12: 17 g via ORAL
  Filled 2012-04-11 (×2): qty 1

## 2012-04-11 MED ORDER — ALPRAZOLAM 0.5 MG PO TABS: 0.5000 mg | ORAL_TABLET | Freq: Every evening | ORAL | Status: DC | PRN

## 2012-04-11 MED ORDER — VANCOMYCIN HCL 10 G IV SOLR
1250.0000 mg | Freq: Once | INTRAVENOUS | Status: AC
  Administered 2012-04-11: 1250 mg via INTRAVENOUS
  Filled 2012-04-11: qty 1250

## 2012-04-11 MED ORDER — INSULIN ASPART 100 UNIT/ML ~~LOC~~ SOLN
3.0000 [IU] | Freq: Three times a day (TID) | SUBCUTANEOUS | Status: DC
  Administered 2012-04-12 – 2012-04-13 (×3): 3 [IU] via SUBCUTANEOUS

## 2012-04-11 NOTE — H&P (Signed)
[facility] History and Physical  [name] [name] ZOX:[id number] DOB: March 22, 1932 DOA: 04/11/2012  Referring physician: Dr. Drue Novel PCP: [name], MD  Specialists: none  Chief Complaint: lower extremity swelling  HPI: [name] [name] is a 79 y.o. female  Past medical history of hypertension, diabetes last hemoglobin A1c of 6.8, coronary artery disease status post bypass surgery in 2002, with ischemic cardiomyopathy with an EF of 20% that comes in for wound in her left foot this started about 2 weeks ago. She relates she hit herself and was not in pain but would notice the next day there was some blood on the floor. She has been putting Neosporin on it and a Band-Aid and it progressively got worse. She relates she's had some purulent drainage, and erythema. Is not tender to touch. She denies any fevers or chills. She denies any antibiotics. It was discussed with her primary care Dr. and [facility] was consulted for admission and IV antibiotic management as well as rule out DVT.  Review of Systems: The patient denies anorexia, fever, weight loss,, vision loss, decreased hearing, hoarseness, chest pain, syncope, dyspnea on exertion, peripheral edema, balance deficits, hemoptysis, abdominal pain, melena, hematochezia, severe indigestion/heartburn, hematuria, incontinence, genital sores, muscle weakness, suspicious skin lesions, transient blindness, difficulty walking, depression, unusual weight change, abnormal bleeding, enlarged lymph nodes, angioedema, and breast masses.    Past Medical History  Diagnosis Date  . Diabetes mellitus   . Hyperlipidemia   . Hypertension   . CAD (coronary artery disease)     s/p stent R side 9/08, re stenosis 1/09, s/p angioplasty 1/09 and a stent on 12/09; re-angioplasty 5/11  . CHF (congestive heart failure)   . Diverticulitis 12/08  . 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 Hx of CABG 08/08/00    LIMA to LAD, SVG to intermediate, SVG to OM2, SVG to distal RCA  . Barrett  esophagus     EGD 08/2009, next 2013  . Carotid arterial disease     s/p stent R side 9-08, re stenosis 1-09, s/p angioplasty 1-09 and a stent on 12-09, last  angiogram 06-24-2009  . Abnormal CT scan, chest     last 06-08-2009, see report   . Cardiac device in situ, other      12-08-- St. Jude Single Chamber    Past Surgical History  Procedure Laterality Date  . Arterial bypass surgry  1999  . Coronary artery bypass graft    . Cardiac defibrillator placement      st judes   Social History:  reports that she has never smoked. She does not have any smokeless tobacco history on file. She reports that she does not drink alcohol or use illicit drugs. Visit home with daughter can perform most of her ADLs  No Known Allergies  Family History  Problem Relation Age of Onset  . Breast cancer Mother 52  . Colon cancer Neg Hx   . Tuberculosis Father     Prior to Admission medications   Medication Sig Start Date End Date Taking? Authorizing Provider  ALPRAZolam (XANAX) 0.25 MG tablet 1 or 2 at bedtime as needed for insomnia. 07/01/10   Wanda Plump, MD  aspirin 325 MG tablet Take 325 mg by mouth daily.      Historical Provider, MD  atorvastatin (LIPITOR) 40 MG tablet Take 1 tablet (40 mg total) by mouth daily. 11/16/11   Herby Abraham, MD  clopidogrel (PLAVIX) 75 MG tablet take 1 tablet by mouth once daily 08/31/11  Herby Abraham, MD  famotidine (PEPCID) 40 MG tablet Take 1 tablet (40 mg total) by mouth every evening. 07/01/10 07/01/11  Wanda Plump, MD  FeFum-FePoly-FA-B Cmp-C-Biot (INTEGRA PLUS) CAPS 1 by mouth daily. 07/05/10   Wanda Plump, MD  ferrous gluconate (FERGON) 324 MG tablet Take 1 tablet (324 mg total) by mouth daily with breakfast. 03/14/11 03/13/12  Wanda Plump, MD  furosemide (LASIX) 40 MG tablet Take 1 tablet (40 mg total) by mouth daily. 03/21/12   Herby Abraham, MD  glimepiride (AMARYL) 4 MG tablet Take 1 tablet (4 mg total) by mouth daily before breakfast. 03/05/12   Wanda Plump, MD   glucose blood (ONE TOUCH ULTRA TEST) test strip 1 each by Other route as needed. Use as instructed     Historical Provider, MD  isosorbide dinitrate (ISORDIL) 30 MG tablet 1/2 tablet two times a day.     Historical Provider, MD  KLOR-CON M10 10 MEQ tablet take 1 tablet by mouth once daily 03/03/12   Wanda Plump, MD  metFORMIN (GLUCOPHAGE) 500 MG tablet take 1 tablet by mouth twice a day with meals 02/17/12   Wanda Plump, MD  metoprolol (LOPRESSOR) 50 MG tablet take 1 tablet by mouth twice a day 11/15/11   Herby Abraham, MD  nitroGLYCERIN (NITROSTAT) 0.4 MG SL tablet Place 1 tablet (0.4 mg total) under the tongue every 5 (five) minutes as needed for chest pain. 02/21/11 02/21/12  Wanda Plump, MD  ramipril (ALTACE) 5 MG capsule Take 1 capsule (5 mg total) by mouth 2 (two) times daily. 01/11/12   Herby Abraham, MD   Physical Exam: There were no vitals filed for this visit.  There were no vitals taken for this visit.  General Appearance:    Alert, cooperative, no distress, appears stated age  Head:    Normocephalic, without obvious abnormality, atraumatic  Eyes:    PERRL, conjunctiva/corneas clear, EOM's intact, fundi    benign, both eyes  Ears:    Normal TM's and external ear canals, both ears     Throat:   Lips, mucosa, and tongue normal; teeth and gums normal  Neck:   Supple, symmetrical, trachea midline, no adenopathy;    thyroid:  no enlargement/tenderness/nodules; no carotid   bruit or JVD  Back:     Symmetric, no curvature, ROM normal, no CVA tenderness  Lungs:     Clear to auscultation bilaterally, respirations unlabored  Chest Wall:    No tenderness or deformity   Heart:    Regular rate and rhythm, S1 and S2 normal, no murmur, rub   or gallop  Breast Exam:    No tenderness, masses, or nipple abnormality  Abdomen:     Soft, non-tender, bowel sounds active all four quadrants,    no masses, no organomegaly        Extremities:   Extremities normal, atraumatic, no cyanosis or edema   Pulses:   2+ and symmetric all extremities  Skin:   lower extremity edema bilateral more on the left than on the right, the left lower child is red not tender to touch, but redness is extending midway of her calf. She also had what seems like 3 cm ulcer on her medial aspect of the first metatarsal head.  Lymph nodes:   Cervical, supraclavicular, and axillary nodes normal  Neurologic:   CNII-XII intact, normal strength, sensation and reflexes    throughout    Labs on Admission:  Basic  Metabolic Panel: No results found for this basename: NA, K, CL, CO2, GLUCOSE, BUN, CREATININE, CALCIUM, MG, PHOS,  in the last 168 hours Liver Function Tests: No results found for this basename: AST, ALT, ALKPHOS, BILITOT, PROT, ALBUMIN,  in the last 168 hours No results found for this basename: LIPASE, AMYLASE,  in the last 168 hours No results found for this basename: AMMONIA,  in the last 168 hours CBC: No results found for this basename: WBC, NEUTROABS, HGB, HCT, MCV, PLT,  in the last 168 hours Cardiac Enzymes: No results found for this basename: CKTOTAL, CKMB, CKMBINDEX, TROPONINI,  in the last 168 hours  BNP (last 3 results) No results found for this basename: PROBNP,  in the last 8760 hours CBG: No results found for this basename: GLUCAP,  in the last 168 hours  Radiological Exams on Admission: No results found.  EKG: Independently reviewed. none  Assessment/Plan  Ulcer of left foot/  Cellulitis - Left lower extremity is more swollen than the right. I have demarcated the redness. We'll go ahead and get blood cultures x2, was started on vancomycin and Elita Quick as she is a diabetic. I am concerned that she might have osteomyelitis as I could probe the ulcer, and also the differential would include septic joint although she denies any pain on examination. Over ahead and get an x-ray of her foot as this has been there for more than 10 days, and an MRI to rule out septic joint and/or osteomyelitis.  He'll also get Lorsch in the DVT asked that left lower extremity is significantly more swollen than the right. - I Explained to the patient that she needs to keep her leg elevated above her heart level if she could tolerate it for more than 18 hours a day.   DIABETES MELLITUS, TYPE II: - DC the Glucophage continue her Amaryl and her sliding scale insulin. - Last hemoglobin A1c showed it was well controlled.   HYPERTENSION - Will controlled continue current home meds. - Will increase her Lasix to twice a day continue to check basic metabolic panels.  Chronic systolic heart failure/ CARDIOMYOPATHY, ISCHEMIC - Has no JVD seems to be well compensated. Continue isosorbide, aspirin, Plavix, Toprol and Altace.   Code Status: DNR  Family Communication: daughter Disposition Plan: home in 4-5 days  Time spent: 60 minutes  Marinda Elk [facility] Pager [phone]  If 7PM-7AM, please contact night-coverage [email or web address] Password [facility] 04/11/2012, 5:09 PM

## 2012-04-11 NOTE — Progress Notes (Signed)
  Subjective:    Patient ID: [name] [name], female    DOB: [birth year], 79 y.o.   MRN: [id number]  HPI Acute visit. Not seen in more than a year, she's here with her daughter. 2 weeks ago, she noted a wound at  the left foot, she use Neosporin and Band-Aids but area is getting worse. She denies much pain, is just sore. Denies any discharge.   Past Medical History: Diabetes mellitus, type II Hyperlipidemia Hypertension -------------------------------------------------- CV: CAD, s/p CABG DEFIBRILLATOR IMPLANT 12-08-- St. Jude Single Chamber   CVA 10-2006 Carotid Artery Dz: s/p stent R side 9-08, re stenosis 1-09, s/p angioplasty 1-09 and a stent on 12-09;  re-angioplasty   5-11 Congestive heart failure --------------------------------------------------- Diverticulitis 12-08 Porcelain GB Dx 12-08 Barrett's esophagus by EGD 08-2009, next 2013    Past Surgical History: CABG 08/08/2000  LIMA to LAD, SVG to intermediate, SVG to OM2, SVG to distal RCA St Judes Cardiac Defibrillator implanted  Social History: widow Non smoker six children  still drives   independent on all ADL   Review of Systems Medication list is reviewed, good compliance. No recent ambulatory blood sugars. Denies any recent fever or chills. No CP-SOB, not needed NTG in long time     Objective:   Physical Exam  Constitutional: She appears well-developed. No distress.  Cardiovascular: Normal rate, regular rhythm and normal heart sounds.   No murmur heard. Pulmonary/Chest: Effort normal and breath sounds normal. No respiratory distress. She has no wheezes. She has no rales.  Musculoskeletal:       Legs: Left leg is chronically more swollen than the right. Seems to be at baseline as far as the size is concerned.  Skin: She is not diaphoretic.          Assessment & Plan:   Patient presents with the following problems: Left lower extremity cellulitis. Open wound, left foot. Diabetes, unknown if  control or not. I recommend admission to the hospital for further eval. Tto rule out osteomyelitis and a DVT. She may benefit from IV antibiotics at the beginning of the treatment and a orthopedic consult. Also, she has not been seen for a year, needs labs. D/W Dr Vernell Barrier who accepted the patient, will be admitted to [facility]

## 2012-04-11 NOTE — Progress Notes (Signed)
ANTIBIOTIC CONSULT NOTE - INITIAL  Pharmacy Consult for Vancomcyin Indication: LE cellulitis/ulcer, r/o osteomyelitis  No Known Allergies  Patient Measurements: Height: 5\' 8"  (172.7 cm) Weight: 193 lb 9 oz (87.8 kg) IBW/kg (Calculated) : 63.9 Adjusted Body Weight:   Vital Signs: Temp: 98 F (36.7 C) (02/19 1700) Temp src: Oral (02/19 1700) BP: 163/73 mmHg (02/19 1700) Pulse Rate: 70 (02/19 1700) Intake/Output from previous day:   Intake/Output from this shift:    Labs: No results found for this basename: WBC, HGB, PLT, LABCREA, CREATININE,  in the last 72 hours Estimated Creatinine Clearance: 49.7 ml/min (by C-G formula based on Cr of 1.1). No results found for this basename: VANCOTROUGH, VANCOPEAK, VANCORANDOM, GENTTROUGH, GENTPEAK, GENTRANDOM, TOBRATROUGH, TOBRAPEAK, TOBRARND, AMIKACINPEAK, AMIKACINTROU, AMIKACIN,  in the last 72 hours   Microbiology: No results found for this or any previous visit (from the past 720 hour(s)).  Medical History: Past Medical History  Diagnosis Date  . Diabetes mellitus   . Hyperlipidemia   . Hypertension   . CAD (coronary artery disease)     s/p stent R side 9/08, re stenosis 1/09, s/p angioplasty 1/09 and a stent on 12/09; re-angioplasty 5/11  . CHF (congestive heart failure)   . Diverticulitis 12/08  . 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 Hx of CABG 08/08/00    LIMA to LAD, SVG to intermediate, SVG to OM2, SVG to distal RCA  . Barrett esophagus     EGD 08/2009, next 2013  . Carotid arterial disease     s/p stent R side 9-08, re stenosis 1-09, s/p angioplasty 1-09 and a stent on 12-09, last  angiogram 06-24-2009  . Abnormal CT scan, chest     last 06-08-2009, see report   . Cardiac device in situ, other      12-08-- St. Jude [employer]   . Myocardial infarction   . ICD (implantable cardiac defibrillator) in place   . Stroke     Medications:  Prescriptions prior to admission  Medication Sig Dispense Refill  . ALPRAZolam (XANAX)  0.25 MG tablet Take 0.25-0.5 mg by mouth at bedtime as needed for sleep.      Marland Kitchen aspirin 325 MG tablet Take 325 mg by mouth daily.       Marland Kitchen atorvastatin (LIPITOR) 40 MG tablet Take 1 tablet (40 mg total) by mouth daily.  30 tablet  6  . clopidogrel (PLAVIX) 75 MG tablet Take 75 mg by mouth daily.      . furosemide (LASIX) 40 MG tablet Take 1 tablet (40 mg total) by mouth daily.  30 tablet  8  . glimepiride (AMARYL) 4 MG tablet Take 1 tablet (4 mg total) by mouth daily before breakfast.  30 tablet  0  . metFORMIN (GLUCOPHAGE) 500 MG tablet Take 500 mg by mouth 2 (two) times daily with a meal.      . metoprolol (LOPRESSOR) 50 MG tablet Take 50 mg by mouth 2 (two) times daily.      . potassium chloride (K-DUR) 10 MEQ tablet Take 10 mEq by mouth daily.      . ramipril (ALTACE) 5 MG capsule Take 1 capsule (5 mg total) by mouth 2 (two) times daily.  60 capsule  4  . glucose blood (ONE TOUCH ULTRA TEST) test strip 1 each by Other route as needed. Use as instructed       . isosorbide dinitrate (ISORDIL) 30 MG tablet 1/2 tablet two times a day.       . nitroGLYCERIN (  NITROSTAT) 0.4 MG SL tablet Place 1 tablet (0.4 mg total) under the tongue every 5 (five) minutes as needed for chest pain.  50 tablet  3   Assessment: 79yof to start Vancomycin and Ceftazidime for LE cellulitis/ulcer. Per MD note, there is a concern for possible osteomyelitis - will dose Vancomycin more aggressively.  - SCr 0.98, CrCl ~55 ml/min - Afebrile, WBC 11.7  Goal of Therapy:  Vancomycin trough level 15-20 mcg/ml  Plan:  1. Vancomycin 1.25g IV x 1 now, then Vancomycin 750mg  IV q12h 2. Continue Ceftazidime 1g IV q8h - dose appropriate 3. Monitor renal function, UOP, cultures, imaging and order level when indicated  Cleon Dew [phone] 04/11/2012,5:39 PM

## 2012-04-11 NOTE — Progress Notes (Signed)
NURSING PROGRESS NOTE  [name] [name] [id number] Admission Data: 04/11/2012 6:43 PM Attending Provider: Marinda Elk, MD WUJ:WJXB Drue Novel, MD Code Status: DNR  [name] [name] is a 79 y.o. female patient admitted from ED  No acute distress noted.  No c/o shortness of breath, no c/o chest pain.    Blood pressure 163/73, pulse 70, temperature 98 F (36.7 C), temperature source Oral, resp. rate 18, height 5\' 8"  (1.727 m), weight 87.8 kg (193 lb 9 oz), SpO2 99.00%.   IV Fluids: IV team to start iv d/t poor veins.  Allergies:  Review of patient's allergies indicates no known allergies.  Past Medical History:   has a past medical history of Diabetes mellitus; Hyperlipidemia; Hypertension; CAD (coronary artery disease); CHF (congestive heart failure); Diverticulitis (12/08); Porcelain gallbladder (12/08); CABG (08/08/00); Barrett esophagus; Carotid arterial disease; Abnormal CT scan, chest; Cardiac device in situ, other; Myocardial infarction; ICD (implantable cardiac defibrillator) in place; and Stroke.  Past Surgical History:   has past surgical history that includes Arterial bypass surgry (1999); Coronary artery bypass graft; and Cardiac defibrillator placement.  Social History:   reports that she has never smoked. She has never used smokeless tobacco. She reports that she does not drink alcohol or use illicit drugs.  Skin: ulcer to left foot  Orientation to room, and floor completed with information packet given to patient/family. Admission INP armband ID verified with patient/family, and in place.   SR up x 2, fall assessment complete, with patient and family able to verbalize understanding of risk associated with falls, and verbalized understanding to call for assistance before getting out of bed.   Call light within reach. Patient able to voice and demonstrate understanding of unit orientation instructions.   Will continue to evaluate and treat per MD orders.  Rosalie Doctor,  RN

## 2012-04-11 NOTE — Patient Instructions (Addendum)
Go to admission at [facility], you will be admitted by the Hospitalist Team

## 2012-04-11 NOTE — Telephone Encounter (Signed)
PENDING ACCEPTANCE TRANFER NOTE:  Call received from:    J. Paz  REASON FOR REQUESTING TRANSFER:    Diabetic foot ulcer  HPI:   Spoke with the phone with Dr. Drue Novel, patient is 79 year old female with diabetes and hypertension. Came in today for evaluation of left great toe cellulitis. Patient also has some swelling and redness, she'll be admitted to the hospital to rule out deep tissue infection and the rule out lower extremity DVT.   PLAN:  According to telephone report, this patient was accepted for transfer to [facility]   Under [facility] team:  [facility],  I have requested an order be written to call Flow Manager at [phone] upon patient arrival to the floor for final physician assignment who will do the admission and give admitting orders.  SIGNED: Clint Lipps, MD [facility]  04/11/2012, 2:31 PM

## 2012-04-12 ENCOUNTER — Inpatient Hospital Stay (HOSPITAL_COMMUNITY): Payer: Medicare Other

## 2012-04-12 ENCOUNTER — Encounter (HOSPITAL_COMMUNITY): Payer: Self-pay | Admitting: Radiology

## 2012-04-12 DIAGNOSIS — M7989 Other specified soft tissue disorders: Secondary | ICD-10-CM

## 2012-04-12 DIAGNOSIS — I5022 Chronic systolic (congestive) heart failure: Secondary | ICD-10-CM | POA: Diagnosis not present

## 2012-04-12 DIAGNOSIS — Z95810 Presence of automatic (implantable) cardiac defibrillator: Secondary | ICD-10-CM

## 2012-04-12 DIAGNOSIS — E119 Type 2 diabetes mellitus without complications: Secondary | ICD-10-CM | POA: Diagnosis not present

## 2012-04-12 DIAGNOSIS — L97509 Non-pressure chronic ulcer of other part of unspecified foot with unspecified severity: Secondary | ICD-10-CM | POA: Diagnosis not present

## 2012-04-12 LAB — URINALYSIS, ROUTINE W REFLEX MICROSCOPIC
Bilirubin Urine: NEGATIVE
Glucose, UA: NEGATIVE mg/dL
Hgb urine dipstick: NEGATIVE
Ketones, ur: NEGATIVE mg/dL
Protein, ur: NEGATIVE mg/dL
pH: 5.5 (ref 5.0–8.0)

## 2012-04-12 LAB — CBC
Hemoglobin: 8.8 g/dL — ABNORMAL LOW (ref 12.0–15.0)
MCH: 25.9 pg — ABNORMAL LOW (ref 26.0–34.0)
MCHC: 31.7 g/dL (ref 30.0–36.0)
Platelets: 229 10*3/uL (ref 150–400)

## 2012-04-12 LAB — BASIC METABOLIC PANEL
BUN: 22 mg/dL (ref 6–23)
Calcium: 9.1 mg/dL (ref 8.4–10.5)
GFR calc Af Amer: 55 mL/min — ABNORMAL LOW (ref >90)
GFR calc non Af Amer: 48 mL/min — ABNORMAL LOW (ref >90)
Glucose, Bld: 93 mg/dL (ref 70–99)
Potassium: 3.8 mEq/L (ref 3.5–5.1)
Sodium: 141 mEq/L (ref 135–145)

## 2012-04-12 LAB — GLUCOSE, CAPILLARY
Glucose-Capillary: 149 mg/dL — ABNORMAL HIGH (ref 70–99)
Glucose-Capillary: 180 mg/dL — ABNORMAL HIGH (ref 70–99)
Glucose-Capillary: 145 mg/dL — ABNORMAL HIGH (ref 70–99)

## 2012-04-12 LAB — URINE MICROSCOPIC-ADD ON

## 2012-04-12 MED ORDER — IOHEXOL 300 MG/ML  SOLN
100.0000 mL | Freq: Once | INTRAMUSCULAR | Status: AC | PRN
  Administered 2012-04-12: 100 mL via INTRAVENOUS

## 2012-04-12 MED ORDER — FUROSEMIDE 40 MG PO TABS
40.0000 mg | ORAL_TABLET | Freq: Two times a day (BID) | ORAL | Status: DC
  Administered 2012-04-12 – 2012-04-13 (×2): 40 mg via ORAL
  Filled 2012-04-12 (×4): qty 1

## 2012-04-12 NOTE — Progress Notes (Signed)
VASCULAR LAB PRELIMINARY  PRELIMINARY  PRELIMINARY  PRELIMINARY  Left lower extremity venous Doppler completed.    Preliminary report:  There is no DVT or SVT noted in the bilateral lower extremities.  Cecillia Menees, RVT 04/12/2012, 10:31 AM

## 2012-04-12 NOTE — Progress Notes (Signed)
[facility] PROGRESS NOTE  [name] [name]:[id number] DOB: 1932/06/14 DOA: 04/11/2012 PCP: [name], MD  HPI/Subjective: Feels much better, denies any pain or fever.   Assessment/Plan:  Ulcer of left foot/ Cellulitis  - Left lower extremity is more swollen than the right. I have demarcated the redness.  -We'll go ahead and get blood cultures x2, was started on vancomycin and Fortaz as she is a diabetic.  -MRI cannot be done because the patient has an ICD. -Wall obtain CT scan to rule out osteomyelitis. -Doppler ultrasound of the lower extremity prelim results is negative.  DIABETES MELLITUS, TYPE II:  - DC the Glucophage continue her Amaryl and her sliding scale insulin.  - Last hemoglobin A1c showed it was well controlled.   HYPERTENSION  - Will controlled continue current home meds.  - Will increase her Lasix to twice a day continue to check basic metabolic panels.   Chronic systolic heart failure/ CARDIOMYOPATHY, ISCHEMIC  - Has no JVD seems to be well compensated. Continue isosorbide, aspirin, Plavix, Toprol and Altace.      Code Status: Full code Family Communication: Discussed with patient Disposition Plan: Inpatient   Consultants:  None  Procedures:  None  Antibiotics:  Fortaz and vancomycin started on 04/11/2012.   Objective: Filed Vitals:   04/11/12 1700 04/11/12 2113 04/12/12 0531  BP: 163/73 142/76 130/66  Pulse: 70 74 70  Temp: 98 F (36.7 C) 98 F (36.7 C) 98.1 F (36.7 C)  TempSrc: Oral Oral Oral  Resp: 18 18 20   Height: 5\' 8"  (1.727 m)    Weight: 87.8 kg (193 lb 9 oz)    SpO2: 99% 96% 94%    Intake/Output Summary (Last 24 hours) at 04/12/12 1039 Last data filed at 04/12/12 0912  Gross per 24 hour  Intake    918 ml  Output   1470 ml  Net   -552 ml   Filed Weights   04/11/12 1700  Weight: 87.8 kg (193 lb 9 oz)    Exam:  General: Alert and awake, oriented x3, not in any acute distress. HEENT: anicteric sclera, pupils  reactive to light and accommodation, EOMI CVS: S1-S2 clear, no murmur rubs or gallops Chest: clear to auscultation bilaterally, no wheezing, rales or rhonchi Abdomen: soft nontender, nondistended, normal bowel sounds, no organomegaly Extremities: There is bilateral +1 lower extremity edema  Neuro: Cranial nerves II-XII intact, no focal neurological deficits  Data Reviewed: Basic Metabolic Panel:  Recent Labs Lab 04/11/12 1931 04/12/12 0641  NA 137 141  K 4.7 3.8  CL 98 102  CO2 24 29  GLUCOSE 163* 93  BUN 24* 22  CREATININE 0.98 1.09  CALCIUM 9.5 9.1   Liver Function Tests:  Recent Labs Lab 04/11/12 1931  AST 18  ALT 9  ALKPHOS 134*  BILITOT 0.2*  PROT 7.4  ALBUMIN 3.3*   No results found for this basename: LIPASE, AMYLASE,  in the last 168 hours No results found for this basename: AMMONIA,  in the last 168 hours CBC:  Recent Labs Lab 04/11/12 1931 04/12/12 0641  WBC 11.7* 10.3  NEUTROABS 8.6*  --   HGB 9.8* 8.8*  HCT 30.6* 27.8*  MCV 82.3 81.8  PLT 247 229   Cardiac Enzymes: No results found for this basename: CKTOTAL, CKMB, CKMBINDEX, TROPONINI,  in the last 168 hours BNP (last 3 results) No results found for this basename: PROBNP,  in the last 8760 hours CBG:  Recent Labs Lab 04/11/12 1740 04/11/12 2126  04/12/12 0737  GLUCAP 122* 176* 98    No results found for this or any previous visit (from the past 240 hour(s)).   Studies: Dg Foot 2 Views Left  04/11/2012  *RADIOLOGY REPORT*  Clinical Data: Left foot cellulitis, whole adjacent to the first metatarsal  LEFT FOOT - 2 VIEW  Comparison: None.  Findings: Diffuse soft tissue swelling about the ankle and forefoot.  The bones are mildly osteopenic.  No acute fracture, malalignment or conventional radiographic evidence of osteomyelitis.  Plantar calcaneal spurring.  IMPRESSION: Diffuse soft tissue swelling without conventional radiographic evidence of osteomyelitis.   Original Report Authenticated By:  Malachy Moan, [name]     Scheduled Meds: . aspirin  325 mg Oral Daily  . atorvastatin  40 mg Oral Daily  . cefTAZidime (FORTAZ)  IV  1 g Intravenous Q8H  . clopidogrel  75 mg Oral Q breakfast  . famotidine  40 mg Oral QPM  . furosemide  40 mg Oral BID  . glimepiride  4 mg Oral QAC breakfast  . heparin  5,000 Units Subcutaneous Q8H  . influenza  inactive virus vaccine  0.5 mL Intramuscular Tomorrow-1000  . insulin aspart  0-5 Units Subcutaneous QHS  . insulin aspart  0-9 Units Subcutaneous TID WC  . insulin aspart  3 Units Subcutaneous TID WC  . isosorbide dinitrate  10 mg Oral BID  . metoprolol  50 mg Oral BID  . potassium chloride  20 mEq Oral Daily  . ramipril  5 mg Oral BID  . sodium chloride  3 mL Intravenous Q12H  . vancomycin  750 mg Intravenous Q12H   Continuous Infusions:   Active Problems:   DIABETES MELLITUS, TYPE II   HYPERTENSION   CARDIOMYOPATHY, ISCHEMIC   Chronic systolic heart failure   Cellulitis   Ulcer of left foot    Time spent: 35 minutes    [facility]  [facility] Pager [phone]. If 8PM-8AM, please contact night-coverage at [email or web address], password [facility] 04/12/2012, 10:39 AM  LOS: 1 day

## 2012-04-12 NOTE — Evaluation (Signed)
Physical Therapy Evaluation Patient Details Name: [name] [name] MRN: [id number] DOB: 01-07-1933 Today's Date: 04/12/2012 Time: 1330-1350 PT Time Calculation (min): 20 min  PT Assessment / Plan / Recommendation Clinical Impression  Pt adm with cellulitis of lt foot.  Pt lives alone but has good family support and should be able to return home.  Needs skilled PT to maximize I and safety to improve quality of life. Instructed pt/daughter to have pt amb in halls with family.    PT Assessment  Patient needs continued PT services    Follow Up Recommendations  No PT follow up    Does the patient have the potential to tolerate intense rehabilitation      Barriers to Discharge        Equipment Recommendations  None recommended by PT    Recommendations for Other Services     Frequency Min 3X/week    Precautions / Restrictions Precautions Precautions: Fall   Pertinent Vitals/Pain No c/o's      Mobility  Bed Mobility Bed Mobility: Sit to Supine Transfers Transfers: Sit to Stand;Stand to Sit Sit to Stand: 4: Min assist;With upper extremity assist;With armrests;From chair/3-in-1 Stand to Sit: 4: Min guard;With upper extremity assist;To bed Details for Transfer Assistance: Verbal cues for hand placement and to turn all the way to surface prior to sitting.  Needed slight lift of hips from low surface of recliner. Ambulation/Gait Ambulation/Gait Assistance: 5: Supervision Ambulation Distance (Feet): 200 Feet Assistive device: Rolling walker Gait Pattern: Step-through pattern;Decreased stride length    Exercises     PT Diagnosis: Difficulty walking;Generalized weakness  PT Problem List: Decreased strength;Decreased mobility PT Treatment Interventions: Gait training;DME instruction;Patient/family education;Functional mobility training;Therapeutic activities;Therapeutic exercise;Balance training   PT Goals Acute Rehab PT Goals PT Goal Formulation: With patient Time For Goal  Achievement: 04/19/12 Potential to Achieve Goals: Good Pt will go Sit to Stand: with modified independence PT Goal: Sit to Stand - Progress: Goal set today Pt will go Stand to Sit: with modified independence PT Goal: Stand to Sit - Progress: Goal set today Pt will Ambulate: 51 - 150 feet;with modified independence;with least restrictive assistive device PT Goal: Ambulate - Progress: Goal set today  Visit Information  Last PT Received On: 04/12/12 Assistance Needed: +1    Subjective Data  Subjective: "I do pretty well," pt states about getting around at home Patient Stated Goal: Return home   Prior Functioning  Home Living Lives With: Alone Available Help at Discharge: Family;Available PRN/intermittently (someone there most of time) Type of Home: House Home Access: Stairs to enter [employer] of Steps: 3-4 Home Layout: One level Bathroom Shower/Tub: Walk-in shower Home Adaptive Equipment: Environmental consultant - rolling Prior Function Level of Independence: Independent with assistive device(s) Able to Take Stairs?: Yes Vocation: Retired Musician: No difficulties    Copywriter, advertising Overall Cognitive Status: Appears within functional limits for tasks assessed/performed Arousal/Alertness: Awake/alert Orientation Level: Appears intact for tasks assessed Behavior During Session: [facility] for tasks performed    Extremity/Trunk Assessment Right Lower Extremity Assessment RLE ROM/Strength/Tone: Deficits RLE ROM/Strength/Tone Deficits: grossly 4/5 Left Lower Extremity Assessment LLE ROM/Strength/Tone: Deficits LLE ROM/Strength/Tone Deficits: grossly 4/5   Balance Balance Balance Assessed: Yes Static Standing Balance Static Standing - Balance Support: Bilateral upper extremity supported (on walker) Static Standing - Level of Assistance: 6: Modified independent (Device/Increase time)  End of Session PT - End of Session Equipment Utilized During Treatment: Gait  belt Activity Tolerance: Patient tolerated treatment well Patient left: in bed;with call bell/phone  within reach;with family/visitor present Nurse Communication: Mobility status  GP     [facility] 04/12/2012, 2:32 PM  [facility] PT [phone]

## 2012-04-13 DIAGNOSIS — E119 Type 2 diabetes mellitus without complications: Secondary | ICD-10-CM | POA: Diagnosis not present

## 2012-04-13 DIAGNOSIS — L97509 Non-pressure chronic ulcer of other part of unspecified foot with unspecified severity: Secondary | ICD-10-CM | POA: Diagnosis not present

## 2012-04-13 DIAGNOSIS — L0291 Cutaneous abscess, unspecified: Secondary | ICD-10-CM | POA: Diagnosis not present

## 2012-04-13 DIAGNOSIS — I5022 Chronic systolic (congestive) heart failure: Secondary | ICD-10-CM | POA: Diagnosis not present

## 2012-04-13 LAB — BASIC METABOLIC PANEL
BUN: 20 mg/dL (ref 6–23)
Chloride: 98 mEq/L (ref 96–112)
GFR calc Af Amer: 54 mL/min — ABNORMAL LOW (ref >90)
GFR calc non Af Amer: 47 mL/min — ABNORMAL LOW (ref >90)
Glucose, Bld: 80 mg/dL (ref 70–99)
Potassium: 3.7 mEq/L (ref 3.5–5.1)
Sodium: 137 mEq/L (ref 135–145)

## 2012-04-13 LAB — CBC
HCT: 26.9 % — ABNORMAL LOW (ref 36.0–46.0)
Hemoglobin: 8.7 g/dL — ABNORMAL LOW (ref 12.0–15.0)
MCHC: 32.3 g/dL (ref 30.0–36.0)
RBC: 3.32 MIL/uL — ABNORMAL LOW (ref 3.87–5.11)
WBC: 10.1 10*3/uL (ref 4.0–10.5)

## 2012-04-13 LAB — GLUCOSE, CAPILLARY: Glucose-Capillary: 167 mg/dL — ABNORMAL HIGH (ref 70–99)

## 2012-04-13 MED ORDER — SULFAMETHOXAZOLE-TRIMETHOPRIM 800-160 MG PO TABS: 1.0000 | ORAL_TABLET | Freq: Two times a day (BID) | ORAL | Status: DC

## 2012-04-13 MED ORDER — CIPROFLOXACIN HCL 500 MG PO TABS: 500.0000 mg | ORAL_TABLET | Freq: Two times a day (BID) | ORAL | Status: DC

## 2012-04-13 MED ORDER — METFORMIN HCL 1000 MG PO TABS: 1000.0000 mg | ORAL_TABLET | Freq: Two times a day (BID) | ORAL | Status: DC

## 2012-04-13 NOTE — Progress Notes (Signed)
Patient discharge teaching given, including activity, diet, follow-up appoints, and medications. Patient verbalized understanding of all discharge instructions. IV access was d/c'd. Vitals are stable. Skin is intact except as charted in most recent assessments. Pt to be escorted out by NT, to be driven home by family. 

## 2012-04-13 NOTE — Progress Notes (Signed)
Physical Therapy Treatment Patient Details Name: [name] [name] MRN: [id number] DOB: Sep 18, 1932 Today's Date: 04/13/2012 Time: 0865-7846 PT Time Calculation (min): 19 min  PT Assessment / Plan / Recommendation Comments on Treatment Session  Pt adm with lt foot cellulitis.  Pt progressing well with mobility.    Follow Up Recommendations  No PT follow up     Does the patient have the potential to tolerate intense rehabilitation     Barriers to Discharge        Equipment Recommendations  None recommended by PT    Recommendations for Other Services    Frequency Min 3X/week   Plan Discharge plan remains appropriate;Frequency remains appropriate    Precautions / Restrictions Precautions Precautions: Fall   Pertinent Vitals/Pain Reports foot is feeling okay.    Mobility  Transfers Sit to Stand: 6: Modified independent (Device/Increase time);With upper extremity assist;From bed Stand to Sit: 6: Modified independent (Device/Increase time);With upper extremity assist;With armrests;To chair/3-in-1 Details for Transfer Assistance: No verbal cues needed today. Ambulation/Gait Ambulation/Gait Assistance: 5: Supervision Ambulation Distance (Feet): 200 Feet Assistive device: Rolling walker Gait Pattern: Step-through pattern;Decreased stride length Gait velocity: decr    Exercises     PT Diagnosis:    PT Problem List:   PT Treatment Interventions:     PT Goals Acute Rehab PT Goals PT Goal: Sit to Stand - Progress: Met PT Goal: Stand to Sit - Progress: Met PT Goal: Ambulate - Progress: Progressing toward goal  Visit Information  Last PT Received On: 04/13/12 Assistance Needed: +1    Subjective Data  Subjective: Pt states she thinks she is going to go home today.   Cognition  Cognition Overall Cognitive Status: Appears within functional limits for tasks assessed/performed Arousal/Alertness: Awake/alert Orientation Level: Appears intact for tasks assessed Behavior During  Session: [facility] for tasks performed    Balance  Static Standing Balance Static Standing - Balance Support: Bilateral upper extremity supported Static Standing - Level of Assistance: 6: Modified independent (Device/Increase time)  End of Session PT - End of Session Equipment Utilized During Treatment: Gait belt Activity Tolerance: Patient tolerated treatment well Patient left: in chair;with call bell/phone within reach Nurse Communication: Mobility status   GP     [facility] 04/13/2012, 9:04 AM  [facility] PT [phone]

## 2012-04-13 NOTE — Care Management Note (Signed)
    Page 1 of 1   04/13/2012     2:08:23 PM   CARE MANAGEMENT NOTE 04/13/2012  Patient:  [name] [name], [name] [name]   Account Number:  [id number]  Date Initiated:  04/13/2012  Documentation initiated by:  Letha Cape  Subjective/Objective Assessment:   dx htn,chf, cellulits  admit- lives alone. pta indep.     Action/Plan:   pt eval- no pt needs.   Anticipated DC Date:  04/13/2012   Anticipated DC Plan:  HOME/SELF CARE      DC Planning Services  CM consult      Choice offered to / List presented to:             Status of service:  Completed, signed off Medicare Important Message given?   (If response is "NO", the following Medicare IM given date fields will be blank) Date Medicare IM given:   Date Additional Medicare IM given:    Discharge Disposition:  HOME/SELF CARE  Per UR Regulation:  Reviewed for med. necessity/level of care/duration of stay  If discussed at Long Length of Stay Meetings, dates discussed:    Comments:  04/13/12 14:07 Letha Cape RN, BSN [phone] patient lives alone, per physical therapy , patient has no pt needs.   Patient for dc today.

## 2012-04-13 NOTE — Discharge Summary (Signed)
. Physician Discharge Summary  [name] [name] ZOX:[id number] DOB: 1933-01-27 DOA: 04/11/2012  PCP: [name], MD  Admit date: 04/11/2012 Discharge date: 04/13/2012  Time spent: 40 minutes  Recommendations for Outpatient Follow-up:  1. Followup with primary care physician within one week  Discharge Diagnoses:  Active Problems:   DIABETES MELLITUS, TYPE II   HYPERTENSION   CARDIOMYOPATHY, ISCHEMIC   Chronic systolic heart failure   Cellulitis   Ulcer of left foot   Discharge Condition: Stable  Diet recommendation: Carbohydrate modified diet  Filed Weights   04/11/12 1700  Weight: 87.8 kg (193 lb 9 oz)    History of present illness:  [name] [name] is a 79 y.o. female  Past medical history of hypertension, diabetes last hemoglobin A1c of 6.8, coronary artery disease status post bypass surgery in 2002, with ischemic cardiomyopathy with an EF of 20% that comes in for wound in her left foot this started about 2 weeks ago. She relates she hit herself and was not in pain but would notice the next day there was some blood on the floor. She has been putting Neosporin on it and a Band-Aid and it progressively got worse. She relates she's had some purulent drainage, and erythema. Is not tender to touch. She denies any fevers or chills. She denies any antibiotics. It was discussed with her primary care Dr. and [facility] was consulted for admission and IV antibiotic management as well as rule out DVT.  Hospital Course:   1. Left foot diabetic ulcer: Patient presented to her primary care physician with left foot ulcer in the base of the left great toe, patient was sent to the hospital for further evaluation to rule out deep tissue infection. MRI was ordered, but it was not done because patient has ICD. CT scan of left lower study was done and showed no evidence of osteomyelitis or abscess formation. Patient was treated with Elita Quick and vancomycin while she is in the hospital, on the time of discharge  she was discharged on Bactrim and ciprofloxacin for 10 more days. Patient to followup with her primary care physician for further evaluation. I did speak to her and her daughter that if the ulcer does not heal she might need referral to a wound clinic.  2. Diabetes mellitus type 2: Uncontrolled diabetes mellitus type 2 with hemoglobin of 7.2, patient is on Glucophage Amaryl. Her metformin increased to 1000 mg by mouth twice a day on the time of discharge, patient to followup with her primary care physician. This is complicated by peripheral neuropathy.  3. Hypertension: Seems to be well-controlled, her home medication was continued throughout the hospital stay, no active issues.   4. Chronic systolic CHF: LVEF is 30-35% on 2-D echocardiogram done on 03/25/2009. Patient is on isosorbide, Plavix, aspirin, Toprol-XL and Altace. Patient is on Lasix, on the day of presentation to the hospital she had +2 pedal edema without evidence of shortness of breath, her Lasix increased to twice a day for 2 days and she had good urine output without, on discharge Lasix dose reduced to once a day, followup with primary care physician, she might need to increase the dose of the Lasix to twice a day at home.  Patient seen by PT/OT, recommended no followup. Patient is quite ambulatory.  Procedures:  Left lower extremity Doppler negative for DVT  Consultations:  none   Discharge Exam: Filed Vitals:   04/12/12 1447 04/12/12 2112 04/13/12 0433 04/13/12 0955  BP: 121/50 133/71 110/55 154/71  Pulse: 72 75 74 81  Temp: 97.6 F (36.4 C) 98.1 F (36.7 C) 98.3 F (36.8 C)   TempSrc: Oral Oral Oral   Resp: 20 20 18    Height:      Weight:      SpO2: 100% 95% 98%    General: Alert and awake, oriented x3, not in any acute distress. HEENT: anicteric sclera, pupils reactive to light and accommodation, EOMI CVS: S1-S2 clear, no murmur rubs or gallops Chest: clear to auscultation bilaterally, no wheezing, rales or  rhonchi Abdomen: soft nontender, nondistended, normal bowel sounds, no organomegaly Extremities: no cyanosis, clubbing or edema noted bilaterally Neuro: Cranial nerves II-XII intact, no focal neurological deficits  Discharge Instructions      Discharge Orders   Future Orders Complete By Expires     Diet Carb Modified  As directed     Increase activity slowly  As directed         Medication List    STOP taking these medications       nitroGLYCERIN 0.4 MG SL tablet  Commonly known as:  NITROSTAT      TAKE these medications       ALPRAZolam 0.25 MG tablet  Commonly known as:  XANAX  Take 0.25-0.5 mg by mouth at bedtime as needed for sleep.     aspirin 325 MG tablet  Take 325 mg by mouth daily.     atorvastatin 40 MG tablet  Commonly known as:  LIPITOR  Take 1 tablet (40 mg total) by mouth daily.     ciprofloxacin 500 MG tablet  Commonly known as:  CIPRO  Take 1 tablet (500 mg total) by mouth 2 (two) times daily.     clopidogrel 75 MG tablet  Commonly known as:  PLAVIX  Take 75 mg by mouth daily.     furosemide 40 MG tablet  Commonly known as:  LASIX  Take 1 tablet (40 mg total) by mouth daily.     glimepiride 4 MG tablet  Commonly known as:  AMARYL  Take 1 tablet (4 mg total) by mouth daily before breakfast.     isosorbide dinitrate 30 MG tablet  Commonly known as:  ISORDIL  1/2 tablet two times a day.     metFORMIN 1000 MG tablet  Commonly known as:  GLUCOPHAGE  Take 1 tablet (1,000 mg total) by mouth 2 (two) times daily with a meal.     metoprolol 50 MG tablet  Commonly known as:  LOPRESSOR  Take 50 mg by mouth 2 (two) times daily.     ONE TOUCH ULTRA TEST test strip  Generic drug:  glucose blood  1 each by Other route as needed. Use as instructed     potassium chloride 10 MEQ tablet  Commonly known as:  K-DUR  Take 10 mEq by mouth daily.     ramipril 5 MG capsule  Commonly known as:  ALTACE  Take 1 capsule (5 mg total) by mouth 2 (two) times  daily.     sulfamethoxazole-trimethoprim 800-160 MG per tablet  Commonly known as:  BACTRIM DS  Take 1 tablet by mouth 2 (two) times daily.       Follow-up Information   Follow up with [name], MD In 1 week.   Contact information:   [address] [place] [address] [phone]        The results of significant diagnostics from this hospitalization (including imaging, microbiology, ancillary and laboratory) are listed below for reference.  Significant Diagnostic Studies: Ct Foot Left W Contrast  04/12/2012  *RADIOLOGY REPORT*  Clinical Data: Cellulitis of the left foot.  Swelling.  CT OF THE LEFT FOOT WITH CONTRAST  Technique:  Multidetector CT imaging was performed following the standard protocol during bolus administration of intravenous contrast.  Contrast: OMNIPAQUE IOHEXOL 300 MG/ML  SOLN  Comparison: Radiographs dated 04/11/2012  Findings: There is no osteomyelitis in the foot or ankle.  There are no significant joint effusions.  There are minimal degenerative changes of the first metatarsal phalangeal joint and of the interphalangeal joint of the great toe.  There is a soft tissue ulceration lying along the medial side of the first metatarsal phalangeal joint but there is no joint effusion.  The patient has diffuse subcutaneous edema particularly on the dorsum of the foot and around the ankle but there is no deep edema in the muscles.  Small plantar calcaneal spur.  No abscesses.  IMPRESSION: No evidence of osteomyelitis or soft tissue abscess.  Diffuse edema which could be secondary to cellulitis.  Superficial soft tissue ulceration medial to the first metatarsal phalangeal joint without evidence of extension to the joint.   Original Report Authenticated By: Francene Boyers, [name]    Dg Foot 2 Views Left  04/11/2012  *RADIOLOGY REPORT*  Clinical Data: Left foot cellulitis, whole adjacent to the first metatarsal  LEFT FOOT - 2 VIEW  Comparison: None.  Findings:  Diffuse soft tissue swelling about the ankle and forefoot.  The bones are mildly osteopenic.  No acute fracture, malalignment or conventional radiographic evidence of osteomyelitis.  Plantar calcaneal spurring.  IMPRESSION: Diffuse soft tissue swelling without conventional radiographic evidence of osteomyelitis.   Original Report Authenticated By: Malachy Moan, [name]     Microbiology: Recent Results (from the past 240 hour(s))  CULTURE, BLOOD (ROUTINE X 2)     Status: None   Collection Time    04/11/12  7:10 PM      Result Value Range Status   Specimen Description BLOOD HAND RIGHT   Final   Special Requests BOTTLES DRAWN AEROBIC ONLY 3CC   Final   Culture  Setup Time 04/12/2012 03:30   Final   Culture     Final   Value:        BLOOD CULTURE RECEIVED NO GROWTH TO DATE CULTURE WILL BE HELD FOR 5 DAYS BEFORE ISSUING A FINAL NEGATIVE REPORT   Report Status PENDING   Incomplete  CULTURE, BLOOD (ROUTINE X 2)     Status: None   Collection Time    04/11/12  7:26 PM      Result Value Range Status   Specimen Description BLOOD ARM RIGHT   Final   Special Requests BOTTLES DRAWN AEROBIC AND ANAEROBIC 10CC   Final   Culture  Setup Time 04/12/2012 03:30   Final   Culture     Final   Value:        BLOOD CULTURE RECEIVED NO GROWTH TO DATE CULTURE WILL BE HELD FOR 5 DAYS BEFORE ISSUING A FINAL NEGATIVE REPORT   Report Status PENDING   Incomplete     Labs: Basic Metabolic Panel:  Recent Labs Lab 04/11/12 1931 04/12/12 0641 04/13/12 0628  NA 137 141 137  K 4.7 3.8 3.7  CL 98 102 98  CO2 24 29 28   GLUCOSE 163* 93 80  BUN 24* 22 20  CREATININE 0.98 1.09 1.11*  CALCIUM 9.5 9.1 8.8   Liver Function Tests:  Recent Labs Lab 04/11/12 1931  AST 18  ALT 9  ALKPHOS 134*  BILITOT 0.2*  PROT 7.4  ALBUMIN 3.3*   No results found for this basename: LIPASE, AMYLASE,  in the last 168 hours No results found for this basename: AMMONIA,  in the last 168 hours CBC:  Recent Labs Lab  04/11/12 1931 04/12/12 0641 04/13/12 0628  WBC 11.7* 10.3 10.1  NEUTROABS 8.6*  --   --   HGB 9.8* 8.8* 8.7*  HCT 30.6* 27.8* 26.9*  MCV 82.3 81.8 81.0  PLT 247 229 219   Cardiac Enzymes: No results found for this basename: CKTOTAL, CKMB, CKMBINDEX, TROPONINI,  in the last 168 hours BNP: BNP (last 3 results) No results found for this basename: PROBNP,  in the last 8760 hours CBG:  Recent Labs Lab 04/12/12 1145 04/12/12 1642 04/12/12 2025 04/12/12 2111 04/13/12 0741  GLUCAP 149* 180* 145* 115* 81       Signed:  Keerthi Hazell A  [facility] 04/13/2012, 10:28 AM

## 2012-04-18 ENCOUNTER — Other Ambulatory Visit: Payer: Self-pay | Admitting: Internal Medicine

## 2012-04-18 LAB — CULTURE, BLOOD (ROUTINE X 2): Culture: NO GROWTH

## 2012-04-18 NOTE — Telephone Encounter (Signed)
Refill done.  Pt needs to schedule OV.  

## 2012-04-19 ENCOUNTER — Telehealth: Payer: Self-pay | Admitting: Internal Medicine

## 2012-04-19 NOTE — Telephone Encounter (Signed)
Patient recently discharged from the hospital, needs a followup within the next week. Please arrange, let me know if she is feeling worse

## 2012-04-19 NOTE — Telephone Encounter (Signed)
Left msg on pt's vmail to return call & schedule hospital follow-up OV next week.

## 2012-04-23 NOTE — Telephone Encounter (Signed)
Discussed with pt, schedule appt 3.5.14 @ 1p.

## 2012-04-25 ENCOUNTER — Ambulatory Visit: Payer: Medicare Other | Admitting: Internal Medicine

## 2012-04-25 DIAGNOSIS — Z0289 Encounter for other administrative examinations: Secondary | ICD-10-CM

## 2012-05-11 ENCOUNTER — Ambulatory Visit (INDEPENDENT_AMBULATORY_CARE_PROVIDER_SITE_OTHER): Payer: Medicare Other | Admitting: Internal Medicine

## 2012-05-11 VITALS — BP 120/68 | HR 63 | Temp 98.4°F

## 2012-05-11 DIAGNOSIS — L97509 Non-pressure chronic ulcer of other part of unspecified foot with unspecified severity: Secondary | ICD-10-CM | POA: Diagnosis not present

## 2012-05-11 DIAGNOSIS — E11621 Type 2 diabetes mellitus with foot ulcer: Secondary | ICD-10-CM

## 2012-05-11 DIAGNOSIS — E119 Type 2 diabetes mellitus without complications: Secondary | ICD-10-CM | POA: Diagnosis not present

## 2012-05-11 DIAGNOSIS — D62 Acute posthemorrhagic anemia: Secondary | ICD-10-CM

## 2012-05-11 DIAGNOSIS — D509 Iron deficiency anemia, unspecified: Secondary | ICD-10-CM

## 2012-05-11 DIAGNOSIS — E1169 Type 2 diabetes mellitus with other specified complication: Secondary | ICD-10-CM | POA: Diagnosis not present

## 2012-05-11 LAB — CBC WITH DIFFERENTIAL/PLATELET
Basophils Absolute: 0 10*3/uL (ref 0.0–0.1)
Basophils Relative: 1 % (ref 0–1)
Eosinophils Relative: 3 % (ref 0–5)
HCT: 26.3 % — ABNORMAL LOW (ref 36.0–46.0)
MCHC: 31.9 g/dL (ref 30.0–36.0)
Monocytes Absolute: 0.7 10*3/uL (ref 0.1–1.0)
Neutro Abs: 5.8 10*3/uL (ref 1.7–7.7)
Platelets: 237 10*3/uL (ref 150–400)
RDW: 15.4 % (ref 11.5–15.5)
WBC: 8.6 10*3/uL (ref 4.0–10.5)

## 2012-05-11 LAB — FERRITIN: Ferritin: 40 ng/mL (ref 10–291)

## 2012-05-11 MED ORDER — AMOXICILLIN-POT CLAVULANATE 875-125 MG PO TABS: 1.0000 | ORAL_TABLET | Freq: Two times a day (BID) | ORAL | Status: DC

## 2012-05-11 MED ORDER — METFORMIN HCL 1000 MG PO TABS: 1000.0000 mg | ORAL_TABLET | Freq: Two times a day (BID) | ORAL | Status: DC

## 2012-05-11 MED ORDER — SULFAMETHOXAZOLE-TRIMETHOPRIM 800-160 MG PO TABS: 1.0000 | ORAL_TABLET | Freq: Two times a day (BID) | ORAL | Status: DC

## 2012-05-11 NOTE — Patient Instructions (Addendum)
Take antibiotics twice a day Metformin 1000 mg twice a day Please come back in 4-6 weeks

## 2012-05-11 NOTE — Progress Notes (Signed)
  Subjective:    Patient ID: [name] [name], female    DOB: 08/09/1932, 79 y.o.   MRN: [id number]  HPI Hospital followup, here with her daughter Admitted last month with a left foot diabetic ulcer and cellulitis, CT show no osteomyelitis, received IV antibiotics and eventually discharged on Bactrim and Cipro. Did not need outpatient PT or OT. Here for followup. Was unable to come earlier d/t weather   Diabetes, A1c was 7.2. Hypertension, BP was well-controlled at the time of admission. Labs and x-rays reviewed: BMP was normal, A1c 7.2. Hemoglobin was 8.7. U/S (-) DVT   Past Medical History: Diabetes mellitus, type II Hyperlipidemia Hypertension -------------------------------------------------- CV: CAD, s/p CABG DEFIBRILLATOR IMPLANT 12-08-- [employer] Jude Single Chamber   CVA 10-2006 Carotid Artery Dz: s/p stent R side 9-08, re stenosis 1-09, s/p angioplasty 1-09 and a stent on 12-09;  re-angioplasty   5-11 Congestive heart failure --------------------------------------------------- Diverticulitis 12-08 Porcelain GB Dx 12-08 Barrett's esophagus by EGD 08-2009, next 2013    Past Surgical History: CABG 08/08/2000  LIMA to LAD, SVG to intermediate, SVG to OM2, SVG to distal RCA [employer] Judes Cardiac Defibrillator implanted  Social History: Widow, lives by herself, somebody stays with her every night Non smoker six children Doesn't drive anymore  independent on all ADL   Review of Systems  Since she left the hospital: She took antibiotics although she had some nausea. She's not taking isosorbide, reason? Metformin was increased from 500 mg to 1000 mg however she is still taking 500. No recent ambulatory CBGs. No chest pain or shortness or breath. Edema has decreased a little since she left the hospital.     Objective:   Physical Exam  Musculoskeletal:       Legs:      Feet:    General -- alert, well-developed .    Lungs -- normal respiratory effort, no intercostal  retractions, no accessory muscle use, and normal breath sounds.   Heart-- normal rate, regular rhythm, no murmur, and no gallop.    Lower extremities: She still has significant slightly-pitting edema worse on the left. Some redness by the left great toe, see graphic. She also has a 3-4 mm round ulcer, see graphic, close to the left toe. No actual discharge or fluctuance. Some odor. Decreased pedal pulses bilaterally Psych--   not anxious appearing and not depressed appearing.         Assessment & Plan:  Today , I spent more than 45  min with the patient, >50% of the time counseling,   reviewing the chart, coordinating her care and carefully discussing plan of care w/ pt's daughter   . Followup one month

## 2012-05-12 NOTE — Assessment & Plan Note (Signed)
Was rec to increase metformin from 500 bid to 1000 bid but didn't New Rx sent , encouraged to increase metformin

## 2012-05-12 NOTE — Assessment & Plan Note (Signed)
Cont w/ anemia  Saw GI 08-2009, see comments below Had EGD, see report below, + Barretts Was recommended iv iron At this point, will recheck labs , w/ current diabetic foot problems, further eval (Cscope) is probably not the best step. Will consider IV iron. ---- GI note 08-2009 Patient has microcytic anemia with Hemoccult-positive stool which was not  reproduced on today's exam. We will repeat her CBC today as well as her  iron studies. She is at risk for conscious sedation because of her cardiomyopathy and carotid artery disease. I would recommend both an endoscopy and colonoscopy, but because of her comorbidities, we will proceed first with an upper endoscopy to see if we can find source of bleeding  such as gastritis, small ulcer or AVM's. She will be started on iron supplements at this point and we will keep her on Plavix and aspirin throughout the procedure. If her upper endoscopy is negative, I will all check with Dr. Riley Kill who is her cardiologist to see whether he would support a screening colonoscopy.if we did not hold Plavix. ----- EGD 09-09-2009: 1) Stricture in the distal esophagus  2) Irregular Z-line in the distal esophagus  3) Hiatal hernia  4) Mild gastritis  5) Otherwise normal examination BX--- Barrett's

## 2012-05-12 NOTE — Assessment & Plan Note (Signed)
Diabetic foot ulcer:  refer to ortho Restart antibiotic, Augmentin and Bactrim. Soak in room temperature water and salt. Check ABIs Leg elevation Increase lasix to 1.5 qd F/u 1 month

## 2012-05-14 ENCOUNTER — Telehealth: Payer: Self-pay | Admitting: Internal Medicine

## 2012-05-14 DIAGNOSIS — D649 Anemia, unspecified: Secondary | ICD-10-CM

## 2012-05-14 NOTE — Telephone Encounter (Signed)
Continue w/ iron def anemia, see las OV note: Plan: refer to hematology for IV iron LMOM to daughter [name] [phone]

## 2012-05-14 NOTE — Addendum Note (Signed)
Addended by: Edwena Felty T on: 05/14/2012 11:12 AM   Modules accepted: Orders

## 2012-05-15 NOTE — Telephone Encounter (Signed)
Spoke with the patient's daughter, let her know that is better not to do further GI workup about anemia, she is in agreement. I expressed my concerns about her foot ulcer, she stays will be "on top" of all her appointments and tests. Will call for any questions or problems.

## 2012-05-23 ENCOUNTER — Encounter (INDEPENDENT_AMBULATORY_CARE_PROVIDER_SITE_OTHER): Payer: Medicare Other

## 2012-05-23 DIAGNOSIS — E11621 Type 2 diabetes mellitus with foot ulcer: Secondary | ICD-10-CM

## 2012-05-23 DIAGNOSIS — I739 Peripheral vascular disease, unspecified: Secondary | ICD-10-CM | POA: Diagnosis not present

## 2012-05-23 DIAGNOSIS — L98499 Non-pressure chronic ulcer of skin of other sites with unspecified severity: Secondary | ICD-10-CM | POA: Diagnosis not present

## 2012-05-28 ENCOUNTER — Encounter (HOSPITAL_COMMUNITY): Payer: Self-pay | Admitting: Pharmacy Technician

## 2012-05-28 ENCOUNTER — Telehealth: Payer: Self-pay | Admitting: Hematology & Oncology

## 2012-05-28 ENCOUNTER — Other Ambulatory Visit (HOSPITAL_COMMUNITY): Payer: Self-pay

## 2012-05-28 DIAGNOSIS — L03119 Cellulitis of unspecified part of limb: Secondary | ICD-10-CM | POA: Diagnosis not present

## 2012-05-28 DIAGNOSIS — M86179 Other acute osteomyelitis, unspecified ankle and foot: Secondary | ICD-10-CM | POA: Diagnosis not present

## 2012-05-28 DIAGNOSIS — L97409 Non-pressure chronic ulcer of unspecified heel and midfoot with unspecified severity: Secondary | ICD-10-CM | POA: Diagnosis not present

## 2012-05-28 DIAGNOSIS — E1149 Type 2 diabetes mellitus with other diabetic neurological complication: Secondary | ICD-10-CM | POA: Diagnosis not present

## 2012-05-28 NOTE — Telephone Encounter (Signed)
Pt cx 4-10 is going in hospital and will call back to reschedule

## 2012-05-29 ENCOUNTER — Other Ambulatory Visit (HOSPITAL_COMMUNITY): Payer: Self-pay | Admitting: Orthopedic Surgery

## 2012-05-30 ENCOUNTER — Encounter (HOSPITAL_COMMUNITY): Admission: RE | Admit: 2012-05-30 | Payer: Medicare Other | Source: Ambulatory Visit

## 2012-05-30 ENCOUNTER — Encounter: Payer: Self-pay | Admitting: Internal Medicine

## 2012-05-31 ENCOUNTER — Ambulatory Visit: Payer: Medicare Other | Admitting: Medical

## 2012-05-31 ENCOUNTER — Ambulatory Visit: Payer: Medicare Other

## 2012-05-31 ENCOUNTER — Encounter (HOSPITAL_BASED_OUTPATIENT_CLINIC_OR_DEPARTMENT_OTHER): Payer: Medicare Other | Attending: Internal Medicine

## 2012-05-31 ENCOUNTER — Other Ambulatory Visit: Payer: Medicare Other | Admitting: Lab

## 2012-05-31 ENCOUNTER — Encounter: Payer: Self-pay | Admitting: Internal Medicine

## 2012-05-31 DIAGNOSIS — L97509 Non-pressure chronic ulcer of other part of unspecified foot with unspecified severity: Secondary | ICD-10-CM | POA: Insufficient documentation

## 2012-05-31 DIAGNOSIS — I2589 Other forms of chronic ischemic heart disease: Secondary | ICD-10-CM | POA: Insufficient documentation

## 2012-05-31 DIAGNOSIS — E1169 Type 2 diabetes mellitus with other specified complication: Secondary | ICD-10-CM | POA: Diagnosis not present

## 2012-05-31 DIAGNOSIS — L97409 Non-pressure chronic ulcer of unspecified heel and midfoot with unspecified severity: Secondary | ICD-10-CM | POA: Diagnosis not present

## 2012-05-31 LAB — GLUCOSE, CAPILLARY: Glucose-Capillary: 228 mg/dL — ABNORMAL HIGH (ref 70–99)

## 2012-06-01 ENCOUNTER — Encounter (HOSPITAL_COMMUNITY): Admission: RE | Payer: Self-pay | Source: Ambulatory Visit

## 2012-06-01 ENCOUNTER — Ambulatory Visit (HOSPITAL_COMMUNITY): Admission: RE | Admit: 2012-06-01 | Payer: Medicare Other | Source: Ambulatory Visit | Admitting: Orthopedic Surgery

## 2012-06-01 SURGERY — AMPUTATION, FOOT, RAY
Anesthesia: General | Laterality: Left

## 2012-06-01 NOTE — Progress Notes (Signed)
[facility]  NAME:  [name] [name], [name] [name] NO.:  [id number]  MEDICAL RECORD NO.:  [id number]      DATE OF BIRTH:  16-Mar-1932  PHYSICIAN:  Maxwell Caul, [name] VISIT DATE:  05/31/2012                                  OFFICE VISIT   CHIEF COMPLAINT:  Review of old diabetic wound over the left first metatarsal head.  HISTORY:  [name] [name] is a pleasant 79 year old woman who tells me that 2 months ago she discovered a wound on her left foot after seeing blood on the floor.  She tried to doctor the open areas herself for perhaps 2 weeks with topical antibiotics.  Ultimately saw her primary physician who is Dr. [name] and was immediately referred to hospitalization for fear of an underlying osteomyelitis/diabetic foot.  I reviewed her hospitalization, which occurred from to April 11, 2012, through April 13, 2012.  A CT scan of the foot at that time was done, which showed a wound present, but no evidence of osteomyelitis.  She was treated with IV antibiotics in the hospital and discharged on oral antibiotics.  She followed up with Dr. Drue Novel, although I do not have these records at the moment.  She has been soaking the foot and applying topical dressings at home.  She had ABIs done that showed a 50% focal left popliteal stenosis, however, the left ABI was 0.89, right 1.1. Monophasic left common femoral artery waveforms were noted.  She had a venous Doppler done as well that did not show evidence of deep or superficial vein thrombosis.  No evidence of a Baker's cyst.  She tells me that she had at least 20 days of oral antibiotics as well after the hospitalization.  She is not currently on antibiotics.  She saw Dr. Aldean Baker on Monday who recommended a first ray amputation on the left.  In fact, the surgery was scheduled for tomorrow.  The patient canceled this at the suggestion of her family and wanted a second opinion here before proceeding  with surgery.  I have reviewed Dr. Audrie Lia office note.  Three-view radiographs obtained in the office showed no destructive changes, however, his careful review documented necrotic tissue, exposed bone, and malodor as well as surrounding erythema.  PAST MEDICAL HISTORY:  Type 2 diabetes with likely diabetic neuropathy, hypertension, significant cardiomyopathy, which is ischemic and ejection fraction of 20%, chronic systolic heart failure.  PAST SURGICAL HISTORY:  CABG in 2002.  MEDICATIONS:  Medication list is reviewed.  She is on Lasix 40 mg daily, ASA 325 daily, atorvastatin 40 daily, glimepiride 4 mg daily, isosorbide 30 mg one-half tablet b.i.d., metformin 1000 b.i.d., metoprolol 50 b.i.d. potassium 10 daily, and Altace 5 daily.  I reviewed her lab work from February.  Her BUN and creatinine were within normal range.  White count was not elevated, however, she is anemic.  On examination, the patient appears systemically well.  Temperature is 98.7, pulse 78, respirations 18, blood pressure 177/75.  CBG is 238.  The area in question is on the outer aspect of the left first metatarsal head.  This was a large deep wound measuring 3.4 x 2.6 x 1.6.  Using a #10 blade, I removed this circumferential callus necrotic tissue, however, there does not  appear to be a viable wound base here.  The base of the metatarsal head is clearly palpable, the sides have exposed periosteum.  There does not appear to be any viable base to this at present.  Further the soft tissues surrounding here is erythematous, somewhat malodorous.  IMPRESSION: 1. Wegener's grade 3 diabetic foot ulcer.  I think there is     possibility of soft tissue infection here.  Dr. Audrie Lia x-rays in     the office did not show destructive changes, however, the     likelihood of underlying osteomyelitis here is still very high.  I     agree with Dr. Audrie Lia assessment, I think the best option here     would be to proceed with a  first ray amputation.  With a prolonged     course of IV antibiotics and meticulous wound care, our chances of     healing this would be very small.  She is not a candidate for     hyperbaric oxygen based on her ischemic cardiomyopathy.  Without     surgery, I think she is putting herself at further risk of the     infection and possibly a more difficult surgery down the road.  I     have spent a considerable amount of time explaining this to the     patient and her daughter who is present.  I further discussed her     condition with her son-in-law who is a physician at [facility]. 2. Advanced ischemic cardiomyopathy.  The patient probably should be     seen preoperatively by Cardiology.  In summary, I agree with Dr. Audrie Lia assessment and plan.  I have explained this to the patient.  For now, we have dressed this with silver alginate, dressed it appropriately, and gave her a healing sandal.  I gave her clindamycin and Septra in case there is some delay in proceeding with the surgery that was canceled for tomorrow.  We will see her back in the early part of next week and make sure that things are stable.  Followup beyond will depend on when surgery could be rescheduled by Dr. Lajoyce Corners and when the family wishes to proceed with this.          ______________________________ Maxwell Caul, [name]     MGR/MEDQ  D:  05/31/2012  T:  06/01/2012  Job:  [id number]

## 2012-06-06 ENCOUNTER — Other Ambulatory Visit: Payer: Self-pay | Admitting: Orthopedic Surgery

## 2012-06-06 ENCOUNTER — Encounter (HOSPITAL_COMMUNITY)
Admission: RE | Admit: 2012-06-06 | Discharge: 2012-06-06 | Disposition: A | Discharge status: Home / Self Care | Payer: Medicare Other | Source: Ambulatory Visit | Attending: Anesthesiology | Admitting: Anesthesiology

## 2012-06-06 ENCOUNTER — Other Ambulatory Visit (HOSPITAL_COMMUNITY): Payer: Medicare Other

## 2012-06-06 ENCOUNTER — Encounter (HOSPITAL_COMMUNITY): Payer: Self-pay

## 2012-06-06 ENCOUNTER — Encounter (HOSPITAL_COMMUNITY)
Admission: RE | Admit: 2012-06-06 | Discharge: 2012-06-06 | Disposition: A | Discharge status: Home / Self Care | Payer: Medicare Other | Source: Ambulatory Visit | Attending: Orthopedic Surgery | Admitting: Orthopedic Surgery

## 2012-06-06 DIAGNOSIS — Z9861 Coronary angioplasty status: Secondary | ICD-10-CM | POA: Diagnosis not present

## 2012-06-06 DIAGNOSIS — M6281 Muscle weakness (generalized): Secondary | ICD-10-CM | POA: Diagnosis not present

## 2012-06-06 DIAGNOSIS — M86179 Other acute osteomyelitis, unspecified ankle and foot: Secondary | ICD-10-CM | POA: Diagnosis not present

## 2012-06-06 DIAGNOSIS — Z01812 Encounter for preprocedural laboratory examination: Secondary | ICD-10-CM | POA: Diagnosis not present

## 2012-06-06 DIAGNOSIS — Z8673 Personal history of transient ischemic attack (TIA), and cerebral infarction without residual deficits: Secondary | ICD-10-CM | POA: Diagnosis not present

## 2012-06-06 DIAGNOSIS — E118 Type 2 diabetes mellitus with unspecified complications: Secondary | ICD-10-CM | POA: Diagnosis not present

## 2012-06-06 DIAGNOSIS — L0291 Cutaneous abscess, unspecified: Secondary | ICD-10-CM | POA: Diagnosis not present

## 2012-06-06 DIAGNOSIS — I509 Heart failure, unspecified: Secondary | ICD-10-CM | POA: Diagnosis present

## 2012-06-06 DIAGNOSIS — R930 Abnormal findings on diagnostic imaging of skull and head, not elsewhere classified: Secondary | ICD-10-CM | POA: Diagnosis not present

## 2012-06-06 DIAGNOSIS — I2589 Other forms of chronic ischemic heart disease: Secondary | ICD-10-CM | POA: Diagnosis not present

## 2012-06-06 DIAGNOSIS — R262 Difficulty in walking, not elsewhere classified: Secondary | ICD-10-CM | POA: Diagnosis not present

## 2012-06-06 DIAGNOSIS — L97409 Non-pressure chronic ulcer of unspecified heel and midfoot with unspecified severity: Secondary | ICD-10-CM | POA: Diagnosis not present

## 2012-06-06 DIAGNOSIS — L988 Other specified disorders of the skin and subcutaneous tissue: Secondary | ICD-10-CM | POA: Diagnosis present

## 2012-06-06 DIAGNOSIS — R279 Unspecified lack of coordination: Secondary | ICD-10-CM | POA: Diagnosis not present

## 2012-06-06 DIAGNOSIS — I10 Essential (primary) hypertension: Secondary | ICD-10-CM | POA: Diagnosis present

## 2012-06-06 DIAGNOSIS — Z951 Presence of aortocoronary bypass graft: Secondary | ICD-10-CM | POA: Diagnosis not present

## 2012-06-06 DIAGNOSIS — N179 Acute kidney failure, unspecified: Secondary | ICD-10-CM | POA: Diagnosis present

## 2012-06-06 DIAGNOSIS — I5022 Chronic systolic (congestive) heart failure: Secondary | ICD-10-CM | POA: Diagnosis not present

## 2012-06-06 DIAGNOSIS — Z01818 Encounter for other preprocedural examination: Secondary | ICD-10-CM | POA: Diagnosis not present

## 2012-06-06 DIAGNOSIS — Z7982 Long term (current) use of aspirin: Secondary | ICD-10-CM | POA: Diagnosis not present

## 2012-06-06 DIAGNOSIS — G8918 Other acute postprocedural pain: Secondary | ICD-10-CM | POA: Diagnosis not present

## 2012-06-06 DIAGNOSIS — Z7902 Long term (current) use of antithrombotics/antiplatelets: Secondary | ICD-10-CM | POA: Diagnosis not present

## 2012-06-06 DIAGNOSIS — I5023 Acute on chronic systolic (congestive) heart failure: Secondary | ICD-10-CM | POA: Diagnosis not present

## 2012-06-06 DIAGNOSIS — L02619 Cutaneous abscess of unspecified foot: Secondary | ICD-10-CM | POA: Diagnosis present

## 2012-06-06 DIAGNOSIS — Z01810 Encounter for preprocedural cardiovascular examination: Secondary | ICD-10-CM | POA: Diagnosis not present

## 2012-06-06 DIAGNOSIS — G4700 Insomnia, unspecified: Secondary | ICD-10-CM | POA: Diagnosis not present

## 2012-06-06 DIAGNOSIS — R918 Other nonspecific abnormal finding of lung field: Secondary | ICD-10-CM | POA: Diagnosis not present

## 2012-06-06 DIAGNOSIS — E119 Type 2 diabetes mellitus without complications: Secondary | ICD-10-CM | POA: Diagnosis not present

## 2012-06-06 DIAGNOSIS — K269 Duodenal ulcer, unspecified as acute or chronic, without hemorrhage or perforation: Secondary | ICD-10-CM | POA: Diagnosis not present

## 2012-06-06 DIAGNOSIS — Z95810 Presence of automatic (implantable) cardiac defibrillator: Secondary | ICD-10-CM | POA: Diagnosis not present

## 2012-06-06 DIAGNOSIS — E1149 Type 2 diabetes mellitus with other diabetic neurological complication: Secondary | ICD-10-CM | POA: Diagnosis present

## 2012-06-06 DIAGNOSIS — D509 Iron deficiency anemia, unspecified: Secondary | ICD-10-CM | POA: Diagnosis present

## 2012-06-06 DIAGNOSIS — J811 Chronic pulmonary edema: Secondary | ICD-10-CM | POA: Diagnosis not present

## 2012-06-06 DIAGNOSIS — E1169 Type 2 diabetes mellitus with other specified complication: Secondary | ICD-10-CM | POA: Diagnosis not present

## 2012-06-06 DIAGNOSIS — R0989 Other specified symptoms and signs involving the circulatory and respiratory systems: Secondary | ICD-10-CM | POA: Diagnosis not present

## 2012-06-06 DIAGNOSIS — E1142 Type 2 diabetes mellitus with diabetic polyneuropathy: Secondary | ICD-10-CM | POA: Diagnosis present

## 2012-06-06 DIAGNOSIS — E785 Hyperlipidemia, unspecified: Secondary | ICD-10-CM | POA: Diagnosis present

## 2012-06-06 DIAGNOSIS — M869 Osteomyelitis, unspecified: Secondary | ICD-10-CM | POA: Diagnosis not present

## 2012-06-06 DIAGNOSIS — R9389 Abnormal findings on diagnostic imaging of other specified body structures: Secondary | ICD-10-CM

## 2012-06-06 DIAGNOSIS — J984 Other disorders of lung: Secondary | ICD-10-CM | POA: Diagnosis not present

## 2012-06-06 DIAGNOSIS — M9080 Osteopathy in diseases classified elsewhere, unspecified site: Secondary | ICD-10-CM | POA: Diagnosis present

## 2012-06-06 DIAGNOSIS — M310 Hypersensitivity angiitis: Secondary | ICD-10-CM | POA: Diagnosis present

## 2012-06-06 DIAGNOSIS — Z4889 Encounter for other specified surgical aftercare: Secondary | ICD-10-CM | POA: Diagnosis not present

## 2012-06-06 DIAGNOSIS — Z79899 Other long term (current) drug therapy: Secondary | ICD-10-CM | POA: Diagnosis not present

## 2012-06-06 DIAGNOSIS — L97509 Non-pressure chronic ulcer of other part of unspecified foot with unspecified severity: Secondary | ICD-10-CM | POA: Diagnosis not present

## 2012-06-06 DIAGNOSIS — S98139A Complete traumatic amputation of one unspecified lesser toe, initial encounter: Secondary | ICD-10-CM | POA: Diagnosis not present

## 2012-06-06 DIAGNOSIS — I252 Old myocardial infarction: Secondary | ICD-10-CM | POA: Diagnosis not present

## 2012-06-06 DIAGNOSIS — R0902 Hypoxemia: Secondary | ICD-10-CM | POA: Diagnosis not present

## 2012-06-06 DIAGNOSIS — I369 Nonrheumatic tricuspid valve disorder, unspecified: Secondary | ICD-10-CM | POA: Diagnosis not present

## 2012-06-06 DIAGNOSIS — M79609 Pain in unspecified limb: Secondary | ICD-10-CM | POA: Diagnosis not present

## 2012-06-06 DIAGNOSIS — I2510 Atherosclerotic heart disease of native coronary artery without angina pectoris: Secondary | ICD-10-CM | POA: Diagnosis present

## 2012-06-06 DIAGNOSIS — F411 Generalized anxiety disorder: Secondary | ICD-10-CM | POA: Diagnosis not present

## 2012-06-06 HISTORY — DX: Anemia, unspecified: D64.9

## 2012-06-06 HISTORY — DX: Gastro-esophageal reflux disease without esophagitis: K21.9

## 2012-06-06 LAB — COMPREHENSIVE METABOLIC PANEL
ALT: 16 U/L (ref 0–35)
AST: 19 U/L (ref 0–37)
Alkaline Phosphatase: 164 U/L — ABNORMAL HIGH (ref 39–117)
CO2: 24 mEq/L (ref 19–32)
Calcium: 9.2 mg/dL (ref 8.4–10.5)
Chloride: 93 mEq/L — ABNORMAL LOW (ref 96–112)
GFR calc Af Amer: 38 mL/min — ABNORMAL LOW (ref >90)
GFR calc non Af Amer: 33 mL/min — ABNORMAL LOW (ref >90)
Glucose, Bld: 191 mg/dL — ABNORMAL HIGH (ref 70–99)
Sodium: 129 mEq/L — ABNORMAL LOW (ref 135–145)
Total Bilirubin: 0.1 mg/dL — ABNORMAL LOW (ref 0.3–1.2)

## 2012-06-06 LAB — CBC
Hemoglobin: 8.3 g/dL — ABNORMAL LOW (ref 12.0–15.0)
MCH: 25.6 pg — ABNORMAL LOW (ref 26.0–34.0)
Platelets: 324 10*3/uL (ref 150–400)
RBC: 3.24 MIL/uL — ABNORMAL LOW (ref 3.87–5.11)
WBC: 13.3 10*3/uL — ABNORMAL HIGH (ref 4.0–10.5)

## 2012-06-06 LAB — SURGICAL PCR SCREEN: Staphylococcus aureus: NEGATIVE

## 2012-06-06 NOTE — Pre-Procedure Instructions (Addendum)
[name] [name]  06/06/2012   Your procedure is scheduled on: 06/08/12  Report to [facility] at 700 AM.  Call this number if you have problems the morning of surgery: [phone]   Remember:   Do not eat food or drink liquids after midnight.   Take these medicines the morning of surgery with A SIP OF WATER:  metoprolol no insulin or diabetic meds am of surgery   STOP plavix per dr   Drucilla Schmidt not wear jewelry, make-up or nail polish.  Do not wear lotions, powders, or perfumes. You may wear deodorant.  Do not shave 48 hours prior to surgery. Men may shave face and neck.  Do not bring valuables to the hospital.  Contacts, dentures or bridgework may not be worn into surgery.  Leave suitcase in the car. After surgery it may be brought to your room.  For patients admitted to the hospital, checkout time is 11:00 AM the day of  discharge.   Patients discharged the day of surgery will not be allowed to drive  home.  Name and phone number of your driver:  Special Instructions: Shower using CHG 2 nights before surgery and the night before surgery.  If you shower the day of surgery use CHG.  Use special wash - you have one bottle of CHG for all showers.  You should use approximately 1/3 of the bottle for each shower.   Please read over the following fact sheets that you were given: Pain Booklet, Coughing and Deep Breathing, MRSA Information and Surgical Site Infection Prevention

## 2012-06-06 NOTE — Progress Notes (Addendum)
Anesthesia PAT Evaluation: Patient is a 79 year old female scheduled for left foot first ray amputation by Dr. Lajoyce Corners on 06/08/2012. Case is posted to be done under [place].  She is on Plavix which Dr. Audrie Lia office said she would not need to hold preoperatively.  History includes nonsmoker, diabetes mellitus type 2, hyperlipidemia, hypertension, CAD s/p CABG (LIMA-LAD, SVG-INT, SVG-OM2, SVG-RCA) 2002, CHF, ischemic cardiomyopathy s/p St. Jude ICD single chamber '08, right carotid artery stenosis s/p stent '08 with angioplasty/stent '09 and angioplasty '11 for re-stenosis, diverticulitis, CVA '08, anemia (H/H 8.4/26.3 on 05/11/12) that her PCP is currently treating with iron for now (saw GI in 2011 with mild gastritis and Barrett's).  PCP is Dr. [name].  Cardiologist is Dr. Riley Kill, last visit was on 03/20/11.  He recommended six month follow-up and consideration of a repeat echo at that time. (She was actually scheduled to see Dr. Riley Kill on the day of her surgery on 06/08/12.)  EP Cardiologist is Dr. Ladona Ridgel, last note that I see is from 04/14/09.  She thinks it may be years since her AICD was interrogated, but denies any known discharges.  (AICD perioperative RX form already faxed to [facility].)   EKG on 06/06/12 showed NSR, possible LAE, cannot rule out inferior infarct, age undetermined.  Poor anterior r wave progression, ST/T wave abnormality, consider lateral ischemia.  Overall, I think it appears stable since 03/02/11.  Currently, her last echo was on 03/25/09 and showed: - Left ventricle: The cavity size was moderately dilated. Wall thickness was increased in a pattern of mild LVH. Systolic function was moderately to severely reduced. The estimated ejection fraction was 40%, in the range of 30% to 35%. Hypokinesis of the basal-mid inferolateral, inferior, and inferoseptal myocardium. Left ventricular diastolic function parameters were normal. - Mitral valve: Mild regurgitation. - Left atrium: The  atrium was mildly dilated. - Atrial septum: The septum bowed from right to left, consistent with increased right atrial pressure. No defect or patent foramen ovale was identified. - Tricuspid valve: Mild regurgitation. - Pulmonary arteries: Systolic pressure was mildly increased. PA peak pressure: 35mm Hg (S).  Cardiac cath on 11/07/06 showed LVEF 30-35%, mild MR, inferior wall motion abnormality, patent LIMA-LAD, SVG-INT, SVT-OM, SVG-distal RCA but total occlusion of the right beyond the graft insertion and retrograde filling of the right from the previous vessel.  CXR on 06/06/12 showed left lower lobe irregular opacity, which primary diagnostic considerations include pneumonia versus neoplasm. Chest CT with IV contrast is recommended for further evaluation. Cheryl at Dr. Audrie Lia office notified.  Patient was afebrile with 99% O2 sats at PAT and did not report any acute respiratory symptoms. (Cardiopulmonary exam was done by Dr. Jacklynn Bue.) She does have mild leukocytosis, but that may be related to her osteomyelitis.  Dr. Audrie Lia office has placed an order for a CT scan.  Preoperative labs showed Na 129 (down from 137 on 04/13/12), K 5.1, Cl 93, BUN 28, 1.49 (up from 1.11 on 04/13/12), glucose 191, Alk Phos 164, WBC 13.3, H/H 8.3/25.0 (stable since 04/13/12), PLT 324K.  PT/PTT WNL.  Plan ISTAT 8 on arrival to re-evaluate abnormal labs.  Meds include Plavix, ASA, Lasix, KCL, ramipril, metoprolol, metformin, glimepiride, isosorbide dinitrate (currently out, with plans to request refill), Lipitor, Xanax.  Patient seen by myself and Anesthesiologist Dr. Jacklynn Bue.  Patient has not been seen by cardiology in over one year.  It may be even longer since her ICD has been interrogated.  Anesthesia recommends cardiology preoperative evaluation.  Dr. Jacklynn Bue notified Dr. Lajoyce Corners.  Apparently, patient was able to move her appointment with Dr. Riley Kill to 06/07/12.    Tomorrow, I will follow-up notes from Dr. Riley Kill and  chest CT scan results, if available.  Velna Ochs [facility]/Anesthesiology Phone [phone] 06/06/2012 4:50 PM  Addendum: 06/07/12 1455 Patient was seen by Dr. Riley Kill earlier today.  His assessment/plan includes: "This is a complicated situation. Overall she is stable cardiac wise, but she has ongoing multiple medical issues that have deteriorated in the setting of a severely infected foot. Surgery was scheduled for tomorrow. I do not feel all of these issues can be addressed in a timely OP manner with risking worsening systemic issues from the foot. I spoke with Dr. Lajoyce Corners. I arranged a general med admission for addressing these issues and planned amputation, that was set for tomorrow, when the issues are stabilized. Cardiology can see in consult if needed. I would not delay surgery for further cardiac workup as it is unlikely to impact this particular procedure. Her cardiac risks are increased if the other medical issues are not addressed however." Dr. Rosalyn Charters notes mentions that she will probably need an echo in-patient.  She also had an ICD check today with Dr. Ladona Ridgel that showed normal device function.  Estimated device longevity of 3.5 years.  Chest CT order from Dr. Lajoyce Corners remains in Epic, but it appears that it is yet to be scheduled.  She apparently had an  abnormal chest CT dating back to 2011, but did not follow through with a pulmonology evaluation and then further work-up was deferred due to her multiple medical issues.  Will defer timing of further evaluation to the Hospitalists and Dr. Lajoyce Corners.

## 2012-06-06 NOTE — Progress Notes (Signed)
Patient not sen since 1/13 by cardiac and unable to remember last interrigation of icd. plavic not d/c'd yet. Revonda Standard to consult with patient icd orders faxed to dr taylor

## 2012-06-07 ENCOUNTER — Other Ambulatory Visit: Payer: Self-pay | Admitting: Internal Medicine

## 2012-06-07 ENCOUNTER — Inpatient Hospital Stay (HOSPITAL_COMMUNITY)
Admission: RE | Admit: 2012-06-07 | Discharge: 2012-06-11 | DRG: 616 | Disposition: A | Discharge status: Skilled Nursing Facility | Payer: Medicare Other | Source: Ambulatory Visit | Attending: Internal Medicine | Admitting: Internal Medicine

## 2012-06-07 ENCOUNTER — Inpatient Hospital Stay (HOSPITAL_COMMUNITY): Payer: Medicare Other

## 2012-06-07 ENCOUNTER — Encounter: Payer: Self-pay | Admitting: Cardiology

## 2012-06-07 ENCOUNTER — Ambulatory Visit (INDEPENDENT_AMBULATORY_CARE_PROVIDER_SITE_OTHER): Payer: Medicare Other | Admitting: Cardiology

## 2012-06-07 ENCOUNTER — Ambulatory Visit (INDEPENDENT_AMBULATORY_CARE_PROVIDER_SITE_OTHER): Payer: Medicare Other | Admitting: *Deleted

## 2012-06-07 ENCOUNTER — Encounter (HOSPITAL_COMMUNITY): Payer: Self-pay | Admitting: *Deleted

## 2012-06-07 VITALS — BP 158/66 | HR 63 | Ht 68.0 in | Wt 194.0 lb

## 2012-06-07 DIAGNOSIS — Z9861 Coronary angioplasty status: Secondary | ICD-10-CM

## 2012-06-07 DIAGNOSIS — E11621 Type 2 diabetes mellitus with foot ulcer: Secondary | ICD-10-CM | POA: Diagnosis present

## 2012-06-07 DIAGNOSIS — Z95810 Presence of automatic (implantable) cardiac defibrillator: Secondary | ICD-10-CM | POA: Diagnosis not present

## 2012-06-07 DIAGNOSIS — I5023 Acute on chronic systolic (congestive) heart failure: Secondary | ICD-10-CM | POA: Diagnosis not present

## 2012-06-07 DIAGNOSIS — I2589 Other forms of chronic ischemic heart disease: Secondary | ICD-10-CM

## 2012-06-07 DIAGNOSIS — M86179 Other acute osteomyelitis, unspecified ankle and foot: Secondary | ICD-10-CM | POA: Diagnosis not present

## 2012-06-07 DIAGNOSIS — Z4889 Encounter for other specified surgical aftercare: Secondary | ICD-10-CM | POA: Diagnosis not present

## 2012-06-07 DIAGNOSIS — K5730 Diverticulosis of large intestine without perforation or abscess without bleeding: Secondary | ICD-10-CM

## 2012-06-07 DIAGNOSIS — K2970 Gastritis, unspecified, without bleeding: Secondary | ICD-10-CM

## 2012-06-07 DIAGNOSIS — L988 Other specified disorders of the skin and subcutaneous tissue: Secondary | ICD-10-CM | POA: Diagnosis present

## 2012-06-07 DIAGNOSIS — S98139A Complete traumatic amputation of one unspecified lesser toe, initial encounter: Secondary | ICD-10-CM | POA: Diagnosis not present

## 2012-06-07 DIAGNOSIS — R269 Unspecified abnormalities of gait and mobility: Secondary | ICD-10-CM

## 2012-06-07 DIAGNOSIS — J811 Chronic pulmonary edema: Secondary | ICD-10-CM | POA: Diagnosis not present

## 2012-06-07 DIAGNOSIS — L0390 Cellulitis, unspecified: Secondary | ICD-10-CM

## 2012-06-07 DIAGNOSIS — M6281 Muscle weakness (generalized): Secondary | ICD-10-CM | POA: Diagnosis not present

## 2012-06-07 DIAGNOSIS — L03039 Cellulitis of unspecified toe: Secondary | ICD-10-CM | POA: Diagnosis present

## 2012-06-07 DIAGNOSIS — E118 Type 2 diabetes mellitus with unspecified complications: Secondary | ICD-10-CM | POA: Diagnosis not present

## 2012-06-07 DIAGNOSIS — G8918 Other acute postprocedural pain: Secondary | ICD-10-CM | POA: Diagnosis not present

## 2012-06-07 DIAGNOSIS — I2510 Atherosclerotic heart disease of native coronary artery without angina pectoris: Secondary | ICD-10-CM | POA: Diagnosis present

## 2012-06-07 DIAGNOSIS — I509 Heart failure, unspecified: Secondary | ICD-10-CM | POA: Diagnosis present

## 2012-06-07 DIAGNOSIS — Z8639 Personal history of other endocrine, nutritional and metabolic disease: Secondary | ICD-10-CM

## 2012-06-07 DIAGNOSIS — K219 Gastro-esophageal reflux disease without esophagitis: Secondary | ICD-10-CM

## 2012-06-07 DIAGNOSIS — Z8673 Personal history of transient ischemic attack (TIA), and cerebral infarction without residual deficits: Secondary | ICD-10-CM | POA: Diagnosis not present

## 2012-06-07 DIAGNOSIS — R0989 Other specified symptoms and signs involving the circulatory and respiratory systems: Secondary | ICD-10-CM | POA: Diagnosis not present

## 2012-06-07 DIAGNOSIS — Z01818 Encounter for other preprocedural examination: Secondary | ICD-10-CM

## 2012-06-07 DIAGNOSIS — Z79899 Other long term (current) drug therapy: Secondary | ICD-10-CM | POA: Diagnosis not present

## 2012-06-07 DIAGNOSIS — L02619 Cutaneous abscess of unspecified foot: Secondary | ICD-10-CM | POA: Diagnosis not present

## 2012-06-07 DIAGNOSIS — R918 Other nonspecific abnormal finding of lung field: Secondary | ICD-10-CM | POA: Diagnosis not present

## 2012-06-07 DIAGNOSIS — M310 Hypersensitivity angiitis: Secondary | ICD-10-CM | POA: Diagnosis present

## 2012-06-07 DIAGNOSIS — E119 Type 2 diabetes mellitus without complications: Secondary | ICD-10-CM

## 2012-06-07 DIAGNOSIS — R279 Unspecified lack of coordination: Secondary | ICD-10-CM | POA: Diagnosis not present

## 2012-06-07 DIAGNOSIS — E1149 Type 2 diabetes mellitus with other diabetic neurological complication: Secondary | ICD-10-CM | POA: Diagnosis present

## 2012-06-07 DIAGNOSIS — R930 Abnormal findings on diagnostic imaging of skull and head, not elsewhere classified: Secondary | ICD-10-CM

## 2012-06-07 DIAGNOSIS — L97509 Non-pressure chronic ulcer of other part of unspecified foot with unspecified severity: Secondary | ICD-10-CM | POA: Diagnosis not present

## 2012-06-07 DIAGNOSIS — Z01812 Encounter for preprocedural laboratory examination: Secondary | ICD-10-CM

## 2012-06-07 DIAGNOSIS — M9080 Osteopathy in diseases classified elsewhere, unspecified site: Secondary | ICD-10-CM | POA: Diagnosis present

## 2012-06-07 DIAGNOSIS — E1169 Type 2 diabetes mellitus with other specified complication: Secondary | ICD-10-CM

## 2012-06-07 DIAGNOSIS — E1142 Type 2 diabetes mellitus with diabetic polyneuropathy: Secondary | ICD-10-CM | POA: Diagnosis present

## 2012-06-07 DIAGNOSIS — K8020 Calculus of gallbladder without cholecystitis without obstruction: Secondary | ICD-10-CM

## 2012-06-07 DIAGNOSIS — G4700 Insomnia, unspecified: Secondary | ICD-10-CM

## 2012-06-07 DIAGNOSIS — E785 Hyperlipidemia, unspecified: Secondary | ICD-10-CM | POA: Diagnosis present

## 2012-06-07 DIAGNOSIS — N179 Acute kidney failure, unspecified: Secondary | ICD-10-CM | POA: Diagnosis not present

## 2012-06-07 DIAGNOSIS — Z951 Presence of aortocoronary bypass graft: Secondary | ICD-10-CM

## 2012-06-07 DIAGNOSIS — I369 Nonrheumatic tricuspid valve disorder, unspecified: Secondary | ICD-10-CM | POA: Diagnosis not present

## 2012-06-07 DIAGNOSIS — I10 Essential (primary) hypertension: Secondary | ICD-10-CM | POA: Diagnosis present

## 2012-06-07 DIAGNOSIS — M869 Osteomyelitis, unspecified: Secondary | ICD-10-CM | POA: Diagnosis not present

## 2012-06-07 DIAGNOSIS — F411 Generalized anxiety disorder: Secondary | ICD-10-CM | POA: Diagnosis not present

## 2012-06-07 DIAGNOSIS — Z7982 Long term (current) use of aspirin: Secondary | ICD-10-CM

## 2012-06-07 DIAGNOSIS — Z7902 Long term (current) use of antithrombotics/antiplatelets: Secondary | ICD-10-CM | POA: Diagnosis not present

## 2012-06-07 DIAGNOSIS — J984 Other disorders of lung: Secondary | ICD-10-CM | POA: Diagnosis not present

## 2012-06-07 DIAGNOSIS — I252 Old myocardial infarction: Secondary | ICD-10-CM | POA: Diagnosis not present

## 2012-06-07 DIAGNOSIS — K269 Duodenal ulcer, unspecified as acute or chronic, without hemorrhage or perforation: Secondary | ICD-10-CM | POA: Diagnosis not present

## 2012-06-07 DIAGNOSIS — Z01810 Encounter for preprocedural cardiovascular examination: Secondary | ICD-10-CM

## 2012-06-07 DIAGNOSIS — I5022 Chronic systolic (congestive) heart failure: Secondary | ICD-10-CM

## 2012-06-07 DIAGNOSIS — L97409 Non-pressure chronic ulcer of unspecified heel and midfoot with unspecified severity: Secondary | ICD-10-CM | POA: Diagnosis not present

## 2012-06-07 DIAGNOSIS — R262 Difficulty in walking, not elsewhere classified: Secondary | ICD-10-CM | POA: Diagnosis not present

## 2012-06-07 DIAGNOSIS — M79609 Pain in unspecified limb: Secondary | ICD-10-CM | POA: Diagnosis not present

## 2012-06-07 DIAGNOSIS — R9389 Abnormal findings on diagnostic imaging of other specified body structures: Secondary | ICD-10-CM

## 2012-06-07 DIAGNOSIS — R0902 Hypoxemia: Secondary | ICD-10-CM | POA: Diagnosis not present

## 2012-06-07 DIAGNOSIS — L03119 Cellulitis of unspecified part of limb: Secondary | ICD-10-CM | POA: Diagnosis not present

## 2012-06-07 DIAGNOSIS — D509 Iron deficiency anemia, unspecified: Secondary | ICD-10-CM | POA: Diagnosis not present

## 2012-06-07 LAB — BASIC METABOLIC PANEL
BUN: 23 mg/dL (ref 6–23)
Calcium: 9.4 mg/dL (ref 8.4–10.5)
Creatinine, Ser: 1.38 mg/dL — ABNORMAL HIGH (ref 0.50–1.10)
GFR calc Af Amer: 42 mL/min — ABNORMAL LOW (ref >90)
GFR calc non Af Amer: 36 mL/min — ABNORMAL LOW (ref >90)
Potassium: 4.6 mEq/L (ref 3.5–5.1)

## 2012-06-07 LAB — ICD DEVICE OBSERVATION
BATTERY VOLTAGE: 2.6019 V
BRDY-0002RV: 40 {beats}/min
DEV-0020ICD: NEGATIVE
HV IMPEDENCE: 43 Ohm
RV LEAD AMPLITUDE: 8.9 mv
RV LEAD IMPEDENCE ICD: 412.5 Ohm
RV LEAD THRESHOLD: 0.75 V
TOT-0009: 1
TOT-0010: 29
TZAT-0001FASTVT: 1
TZAT-0012FASTVT: 200 ms
TZAT-0013FASTVT: 1
TZAT-0013SLOWVT: 3
TZAT-0018SLOWVT: NEGATIVE
TZAT-0019FASTVT: 7.5 V
TZAT-0019SLOWVT: 7.5 V
TZAT-0020FASTVT: 1 ms
TZAT-0020SLOWVT: 1 ms
TZON-0003SLOWVT: 340 ms
TZON-0004FASTVT: 16
TZON-0005FASTVT: 6
TZON-0005SLOWVT: 6
TZST-0001FASTVT: 2
TZST-0001FASTVT: 4
TZST-0001SLOWVT: 2
TZST-0001SLOWVT: 4
TZST-0001SLOWVT: 5
TZST-0003FASTVT: 25 J
TZST-0003FASTVT: 30 J
TZST-0003FASTVT: 36 J
TZST-0003SLOWVT: 25 J
VENTRICULAR PACING ICD: 0.59 pct
VF: 0

## 2012-06-07 LAB — CBC
HCT: 24.3 % — ABNORMAL LOW (ref 36.0–46.0)
MCHC: 33.3 g/dL (ref 30.0–36.0)
MCV: 75.9 fL — ABNORMAL LOW (ref 78.0–100.0)
RDW: 14.4 % (ref 11.5–15.5)

## 2012-06-07 MED ORDER — ACETAMINOPHEN 650 MG RE SUPP: 650.0000 mg | Freq: Four times a day (QID) | RECTAL | Status: DC | PRN

## 2012-06-07 MED ORDER — ALUM & MAG HYDROXIDE-SIMETH 200-200-20 MG/5ML PO SUSP: 30.0000 mL | Freq: Four times a day (QID) | ORAL | Status: DC | PRN

## 2012-06-07 MED ORDER — ISOSORBIDE DINITRATE 30 MG PO TABS: 15.0000 mg | ORAL_TABLET | Freq: Two times a day (BID) | ORAL | Status: DC

## 2012-06-07 MED ORDER — ASPIRIN 325 MG PO TABS
325.0000 mg | ORAL_TABLET | Freq: Every day | ORAL | Status: DC
  Administered 2012-06-07 – 2012-06-11 (×5): 325 mg via ORAL
  Filled 2012-06-07 (×5): qty 1

## 2012-06-07 MED ORDER — ONDANSETRON HCL 4 MG/2ML IJ SOLN
4.0000 mg | Freq: Four times a day (QID) | INTRAMUSCULAR | Status: DC | PRN
  Administered 2012-06-10: 4 mg via INTRAVENOUS
  Filled 2012-06-07 (×2): qty 2

## 2012-06-07 MED ORDER — SODIUM CHLORIDE 0.9 % IV SOLN
INTRAVENOUS | Status: DC
  Administered 2012-06-07: 23:00:00 via INTRAVENOUS

## 2012-06-07 MED ORDER — INSULIN ASPART 100 UNIT/ML ~~LOC~~ SOLN
0.0000 [IU] | Freq: Three times a day (TID) | SUBCUTANEOUS | Status: DC
  Administered 2012-06-08: 1 [IU] via SUBCUTANEOUS
  Administered 2012-06-08: 2 [IU] via SUBCUTANEOUS
  Administered 2012-06-09: 1 [IU] via SUBCUTANEOUS
  Administered 2012-06-09: 2 [IU] via SUBCUTANEOUS
  Administered 2012-06-10: 3 [IU] via SUBCUTANEOUS
  Administered 2012-06-10 – 2012-06-11 (×3): 2 [IU] via SUBCUTANEOUS

## 2012-06-07 MED ORDER — METOPROLOL TARTRATE 50 MG PO TABS
50.0000 mg | ORAL_TABLET | Freq: Two times a day (BID) | ORAL | Status: DC
  Administered 2012-06-07 – 2012-06-11 (×8): 50 mg via ORAL
  Filled 2012-06-07 (×9): qty 1

## 2012-06-07 MED ORDER — HEPARIN SODIUM (PORCINE) 5000 UNIT/ML IJ SOLN
5000.0000 [IU] | Freq: Three times a day (TID) | INTRAMUSCULAR | Status: DC
  Administered 2012-06-07 – 2012-06-11 (×11): 5000 [IU] via SUBCUTANEOUS
  Filled 2012-06-07 (×14): qty 1

## 2012-06-07 MED ORDER — DEXTROSE 5 % IV SOLN: 2.0000 g | INTRAVENOUS | Status: DC

## 2012-06-07 MED ORDER — DEXTROSE 5 % IV SOLN: 2.0000 g | INTRAVENOUS | Status: AC

## 2012-06-07 MED ORDER — ALPRAZOLAM 0.25 MG PO TABS
0.2500 mg | ORAL_TABLET | Freq: Every evening | ORAL | Status: DC | PRN
  Filled 2012-06-07: qty 2

## 2012-06-07 MED ORDER — ISOSORBIDE DINITRATE 5 MG PO TABS
15.0000 mg | ORAL_TABLET | Freq: Two times a day (BID) | ORAL | Status: DC
  Administered 2012-06-07 – 2012-06-11 (×8): 15 mg via ORAL
  Filled 2012-06-07 (×9): qty 1

## 2012-06-07 MED ORDER — ATORVASTATIN CALCIUM 40 MG PO TABS
40.0000 mg | ORAL_TABLET | Freq: Every day | ORAL | Status: DC
  Administered 2012-06-07 – 2012-06-11 (×5): 40 mg via ORAL
  Filled 2012-06-07 (×5): qty 1

## 2012-06-07 MED ORDER — ONDANSETRON HCL 4 MG PO TABS: 4.0000 mg | ORAL_TABLET | Freq: Four times a day (QID) | ORAL | Status: DC | PRN

## 2012-06-07 MED ORDER — SODIUM CHLORIDE 0.9 % IJ SOLN
3.0000 mL | Freq: Two times a day (BID) | INTRAMUSCULAR | Status: DC
  Administered 2012-06-07 – 2012-06-10 (×4): 3 mL via INTRAVENOUS

## 2012-06-07 MED ORDER — INSULIN ASPART 100 UNIT/ML ~~LOC~~ SOLN
0.0000 [IU] | Freq: Every day | SUBCUTANEOUS | Status: DC
  Administered 2012-06-10: 2 [IU] via SUBCUTANEOUS

## 2012-06-07 MED ORDER — ACETAMINOPHEN 325 MG PO TABS: 650.0000 mg | ORAL_TABLET | Freq: Four times a day (QID) | ORAL | Status: DC | PRN

## 2012-06-07 MED ORDER — HYDROCODONE-ACETAMINOPHEN 5-325 MG PO TABS
1.0000 | ORAL_TABLET | ORAL | Status: DC | PRN
  Administered 2012-06-08: 2 via ORAL
  Filled 2012-06-07: qty 2

## 2012-06-07 MED ORDER — HYDROMORPHONE HCL PF 1 MG/ML IJ SOLN
1.0000 mg | INTRAMUSCULAR | Status: DC | PRN
  Filled 2012-06-07: qty 1

## 2012-06-07 MED ORDER — CEFAZOLIN SODIUM 1-5 GM-% IV SOLN
1.0000 g | Freq: Three times a day (TID) | INTRAVENOUS | Status: DC
  Administered 2012-06-07 – 2012-06-09 (×6): 1 g via INTRAVENOUS
  Filled 2012-06-07 (×11): qty 50

## 2012-06-07 NOTE — Progress Notes (Signed)
HPI:  This patient is seen in pre-op cardiac evaluation.  She was seen in anesthesia yesterday and surgery cancelled for preop cardiac evaluation.  There are multiple issues.  Her foot has been bothering her recently and she has developed presumed osteo of the large toe with an ulcer that is nonhealing.  Perhaps more importantly, she has had an abnormal CXR with a CT recommended, worsening renal function, and significant microcytic anemia.  She has been on dual antiplatelet treatment primarily for CNS stents placed by IVR.  Cardiac wise she denies chest pain, or significant progressive shortness of breath.  Two notes:  Her ICD has not been checked, but was so today-----letters had been sent for follow up but she had not come in.  Also, after abnormal CT in 2011, Dr. Drue Novel had arranged pulm evaluation, but patient did not go, then discussed later with Dr. Drue Novel the approach to the CT.  Given her medical issues, further eval had been deferred.     Current Outpatient Prescriptions  Medication Sig Dispense Refill  . ALPRAZolam (XANAX) 0.25 MG tablet Take 0.25-0.5 mg by mouth at bedtime as needed for sleep.      Marland Kitchen aspirin 325 MG tablet Take 325 mg by mouth daily.       Marland Kitchen atorvastatin (LIPITOR) 40 MG tablet Take 1 tablet (40 mg total) by mouth daily.  30 tablet  6  . clopidogrel (PLAVIX) 75 MG tablet Take 75 mg by mouth daily.      . furosemide (LASIX) 40 MG tablet Take 60 mg by mouth daily.      Marland Kitchen glimepiride (AMARYL) 4 MG tablet Take 4 mg by mouth daily before breakfast.      . glucose blood (ONE TOUCH ULTRA TEST) test strip 1 each by Other route as needed. Use as instructed       . isosorbide dinitrate (ISORDIL) 30 MG tablet Take 0.5 tablets (15 mg total) by mouth 2 (two) times daily.  60 tablet  6  . metFORMIN (GLUCOPHAGE) 500 MG tablet Take 500 mg by mouth 2 (two) times daily with a meal.      . metoprolol (LOPRESSOR) 50 MG tablet Take 50 mg by mouth 2 (two) times daily.      . potassium chloride  (K-DUR) 10 MEQ tablet Take 10 mEq by mouth daily.      . ramipril (ALTACE) 5 MG capsule Take 1 capsule (5 mg total) by mouth 2 (two) times daily.  60 capsule  4   No current facility-administered medications for this visit.   Facility-Administered Medications Ordered in Other Visits  Medication Dose Route Frequency Provider Last Rate Last Dose  . [START ON 06/08/2012] ceFAZolin (ANCEF) 2 g in dextrose 5 % 50 mL IVPB  2 g Intravenous On Call to OR Rhetta Mura, MD        No Known Allergies  Past Medical History  Diagnosis Date  . Diabetes mellitus   . Hyperlipidemia   . Hypertension   . CAD (coronary artery disease)     s/p stent R side 9/08, re stenosis 1/09, s/p angioplasty 1/09 and a stent on 12/09; re-angioplasty 5/11  . CHF (congestive heart failure)   . Diverticulitis 12/08  . 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 Hx of CABG 08/08/00    LIMA to LAD, SVG to intermediate, SVG to OM2, SVG to distal RCA  . Barrett esophagus     EGD 08/2009, next 2013  . Carotid arterial disease  s/p stent R side 9-08, re stenosis 1-09, s/p angioplasty 1-09 and a stent on 12-09, last  angiogram 06-24-2009  . Abnormal CT scan, chest     last 06-08-2009, see report   . Cardiac device in situ, other      12-08-- St. Jude [employer]   . Myocardial infarction   . ICD (implantable cardiac defibrillator) in place   . Stroke   . Collagen vascular disease   . Pneumonia     vural 30 yrs ago  . GERD (gastroesophageal reflux disease)     occ  . Anemia     hx    Past Surgical History  Procedure Laterality Date  . Arterial bypass surgry  1999  . Coronary artery bypass graft    . Cardiac defibrillator placement      st judes    Family History  Problem Relation Age of Onset  . Breast cancer Mother 37  . Colon cancer Neg Hx   . Tuberculosis Father     History   Social History  . Marital Status: Widowed    Spouse Name: N/A    Number of Children: 6  . Years of Education: N/A    Occupational History  . Not on file.   Social History Main Topics  . Smoking status: Never Smoker   . Smokeless tobacco: Never Used  . Alcohol Use: No  . Drug Use: No  . Sexually Active: No   Other Topics Concern  . Not on file   Social History Narrative   Lives w/ husband   Still drives   Independent on all ADL          ROS: Please see the HPI.  All other systems reviewed and negative.  PHYSICAL EXAM:  BP 158/66  Pulse 63  Ht 5\' 8"  (1.727 m)  Wt 194 lb (87.998 kg)  BMI 29.5 kg/m2  SpO2 99%  General: Well developed, well nourished, in no acute distress. Head:  Normocephalic and atraumatic. Neck: no JVD Lungs: Clear to auscultation and percussion. Chest:  Device site looks good.   Heart: Normal S1 and S2.  Apical murmur.   Extremities: Bilateral edema, L greater than R with smelly, non healing ulcer on the left foot.   Neurologic: Alert and oriented x 3.  EKG:  NSR.  LVH with repole changes.  Inferior MI, age indeterminate.  ST and T changes, cannot exclude lateral ischemia.  No definite change from 2013  CATH 2008  ANGIOGRAPHIC DATA:  1. The left main is free of critical disease.  2. The LAD is totally occluded.  3. The circumflex is occluded after a first marginal branch, which is  small to moderate in size. There may be mild irregularity at the  ostium of this vessel, but no significant high-grade focal  obstruction.  4. The right coronary artery is totally occluded.  5. The internal mammary to the LAD is widely patent. The LAD distally  has multiple areas of 30% narrowing, and collateralizes the PDA.  6. The saphenous vein graft to the intermediate is widely patent.  This collateralizes the AV circumflex.  7. The saphenous vein graft to the obtuse marginal 2 is intact, and it  also collateralizes the AV circumflex and third marginal.  8. The saphenous vein graft to the distal right is intact, but the  distal right coronary artery is occluded. As  noted previously, it  fills retrograde from the internal mammary to the LAD.  9. The ventriculogram  demonstrates akinesis of the inferior segment  with mild mitral regurgitation. The anterolateral segment moves  well.  CONCLUSIONS:  1. Moderate reduction in left ventricular function with estimated  ejection fraction in the 30 to 35% range, with mild mitral  regurgitation and inferior wall motion abnormality.  2. Continued patency of the internal mammary to the LAD with perhaps  some recovery of the anterior wall.  3. Continued patency of saphenous vein grafts to the intermediate and  obtuse marginal.  4. Continued patency of the saphenous vein graft to the distal right,  but total occlusion of the right beyond the graft insertion, and  retrograde filling of the right from the previous vessel.  PLAN: I have discussed this with the patient's family. We will go  ahead and get her admitted and do a MRI and 2-D echo   ECHO 2011  Study Conclusions  - Left ventricle: The cavity size was moderately dilated. Wall thickness was increased in a pattern of mild LVH. Systolic function was moderately to severely reduced. The estimated ejection fraction was 40%, in the range of 30% to 35%. Hypokinesis of the basal-mid inferolateral, inferior, and inferoseptal myocardium. Left ventricular diastolic function parameters were normal. - Mitral valve: Mild regurgitation. - Left atrium: The atrium was mildly dilated. - Atrial septum: The septum bowed from right to left, consistent with increased right atrial pressure. No defect or patent foramen ovale was identified. - Pulmonary arteries: Systolic pressure was mildly increased. PA peak pressure: 35mm Hg (S).     ASSESSMENT AND PLAN:  1.  Osteomyelitis of the left great toe requiring amputation. 2.  Ischemic cardiomyopathy, relatively stable status 3.  Microcytic anemia,  Worsening 4.  CKD, worse in last month with fairly stable baseline cr  prior.  5.  Abnormal chest CT  -- see all prior notes in EPIC--Dr. Drue Novel addressed in 2011  This is a complicated situation.  Overall she is stable cardiac wise, but she has ongoing multiple medical issues that have deteriorated in the setting of a severely infected foot.  Surgery was scheduled for tomorrow.  I do not feel all of these issues can be addressed in a timely OP manner with risking worsening systemic issues from the foot.  I spoke with Dr. Lajoyce Corners.  I arranged a general med admission for addressing these issues and planned amputation, that was set for tomorrow, when the issues are stabilized.    Cardiology can see in consult if needed.  I would not delay surgery for further cardiac workup as it is unlikely to impact this particular procedure.  Her cardiac risks are increased if the other medical issues are not addressed however.

## 2012-06-07 NOTE — Assessment & Plan Note (Signed)
On good medical therapy.  Probably needs ECHO inpatient.

## 2012-06-07 NOTE — Progress Notes (Signed)
ICD check 

## 2012-06-07 NOTE — Assessment & Plan Note (Signed)
NO current symptoms.

## 2012-06-07 NOTE — Patient Instructions (Addendum)
Your physician recommends that you schedule a follow-up appointment in: July with Dr Ladona Ridgel for device follow-up  Please proceed to admitting at [facility].

## 2012-06-07 NOTE — Progress Notes (Signed)
Pt a direct admit from home. Admitted for amputation of left great toe. Dressing applied to ulcer on left toe. Dressing clean dry and intact. Paged admitting MD upon patient arrival. Awaiting further orders. [name] [name]

## 2012-06-07 NOTE — Assessment & Plan Note (Signed)
Needs further evaluation.

## 2012-06-07 NOTE — Progress Notes (Signed)
Pt has no IV access at this time. RN to get IV team to start IV.

## 2012-06-07 NOTE — H&P (Signed)
History and Physical       Hospital Admission Note Date: 06/07/2012  Patient name: [name] [name] Medical record number: [id number] Date of birth: 05/20/1932 Age: 79 y.o. Gender: female PCP: [name], MD  Primary cardiologist: Dr Riley Kill  Chief Complaint:  Left foot ulcer  HPI: Patient is a 79 year old female with multiple medical problems including diabetes, hypertension, hyperlipidemia, CAD, CHF was sent from Dr. Rosalyn Charters office after the preop cardiac evaluation. Patient has large left foot ulcer and she has developed presumed osteomyelitis. The patient was admitted in 2/14 for the same and CT scan of the foot did not show any evidence of osteoarthritis or abscess formation at that time. Patient was treated with Elita Quick and vancomycin while she was in the hospital and then discharged on Bactrim and ciprofloxacin for 10 more days. Patient states that her foot ulcer did not improve much. She saw Dr. Lajoyce Corners 2 weeks ago in the office and was recommended debridement/amputation.  Patient was seen by Dr. Riley Kill today for cardiac evaluation and was cleared from cardiac standpoint.  However it was noted that patient's creatinine function was worsened to 1.49 and patient was sent to the hospital for medical admission..    Review of Systems:  Constitutional: Denies fever, chills, diaphoresis, poor appetite and fatigue.  HEENT: Denies photophobia, eye pain, redness, hearing loss, ear pain, congestion, sore throat, rhinorrhea, sneezing, mouth sores, trouble swallowing, neck pain, neck stiffness and tinnitus.   Respiratory: Denies SOB, DOE, cough, chest tightness,  and wheezing.   Cardiovascular: Denies chest pain, palpitations and leg swelling.  Gastrointestinal: Denies nausea, vomiting, abdominal pain, diarrhea, constipation, blood in stool and abdominal distention.  Genitourinary: Denies dysuria, urgency, frequency, hematuria, flank pain and  difficulty urinating.  Musculoskeletal: Denies myalgias, back pain, joint swelling, arthralgias and gait problem.  Skin: Please see history of present illness Neurological: Denies dizziness, seizures, syncope, weakness, light-headedness, numbness and headaches.  Hematological: Denies adenopathy. Easy bruising, personal or family bleeding history  Psychiatric/Behavioral: Denies suicidal ideation, mood changes, confusion, nervousness, sleep disturbance and agitation  Past Medical History: Past Medical History  Diagnosis Date  . Diabetes mellitus   . Hyperlipidemia   . Hypertension   . CAD (coronary artery disease)     s/p stent R side 9/08, re stenosis 1/09, s/p angioplasty 1/09 and a stent on 12/09; re-angioplasty 5/11  . CHF (congestive heart failure)   . Diverticulitis 12/08  . 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 Hx of CABG 08/08/00    LIMA to LAD, SVG to intermediate, SVG to OM2, SVG to distal RCA  . Barrett esophagus     EGD 08/2009, next 2013  . Carotid arterial disease     s/p stent R side 9-08, re stenosis 1-09, s/p angioplasty 1-09 and a stent on 12-09, last  angiogram 06-24-2009  . Abnormal CT scan, chest     last 06-08-2009, see report   . Cardiac device in situ, other      12-08-- [employer]   . Myocardial infarction   . ICD (implantable cardiac defibrillator) in place   . Stroke   . Collagen vascular disease   . Pneumonia     vural 30 yrs ago  . GERD (gastroesophageal reflux disease)     occ  . Anemia     hx   Past Surgical History  Procedure Laterality Date  . Arterial bypass surgry  1999  . Coronary artery bypass graft    . Cardiac defibrillator placement  st judes    Medications: Prior to Admission medications   Medication Sig Start Date End Date Taking? Authorizing Provider  ALPRAZolam (XANAX) 0.25 MG tablet Take 0.25-0.5 mg by mouth at bedtime as needed for sleep.    Historical Provider, MD  aspirin 325 MG tablet Take 325 mg by mouth daily.      Historical Provider, MD  atorvastatin (LIPITOR) 40 MG tablet Take 1 tablet (40 mg total) by mouth daily. 11/16/11   Herby Abraham, MD  clopidogrel (PLAVIX) 75 MG tablet Take 75 mg by mouth daily.    Historical Provider, MD  furosemide (LASIX) 40 MG tablet Take 60 mg by mouth daily. 03/21/12   Herby Abraham, MD  glimepiride (AMARYL) 4 MG tablet Take 4 mg by mouth daily before breakfast.    Historical Provider, MD  glucose blood (ONE TOUCH ULTRA TEST) test strip 1 each by Other route as needed. Use as instructed     Historical Provider, MD  isosorbide dinitrate (ISORDIL) 30 MG tablet Take 0.5 tablets (15 mg total) by mouth 2 (two) times daily. 06/07/12   Herby Abraham, MD  metFORMIN (GLUCOPHAGE) 500 MG tablet Take 500 mg by mouth 2 (two) times daily with a meal.    Historical Provider, MD  metoprolol (LOPRESSOR) 50 MG tablet Take 50 mg by mouth 2 (two) times daily.    Historical Provider, MD  potassium chloride (K-DUR) 10 MEQ tablet Take 10 mEq by mouth daily.    Historical Provider, MD  ramipril (ALTACE) 5 MG capsule Take 1 capsule (5 mg total) by mouth 2 (two) times daily. 01/11/12   Herby Abraham, MD    Allergies:  No Known Allergies  Social History:  reports that she has never smoked. She has never used smokeless tobacco. She reports that she does not drink alcohol or use illicit drugs. patient currently lives at home and ambulates with a walker  Family History: Family History  Problem Relation Age of Onset  . Breast cancer Mother 77  . Colon cancer Neg Hx   . Tuberculosis Father     Physical Exam: Blood pressure 156/76, pulse 81, temperature 98.8 F (37.1 C), temperature source Oral, resp. rate 16, height 5\' 8"  (1.727 m), weight 87.816 kg (193 lb 9.6 oz), SpO2 97.00%. General: Alert, awake, oriented x3, in no acute distress. HEENT: normocephalic, atraumatic, anicteric sclera, pink conjunctiva, pupils equal and reactive to light and accomodation, oropharynx clear Neck:  supple, no masses or lymphadenopathy, no goiter, no bruits  Heart: Regular rate and rhythm, without murmurs, rubs or gallops. Lungs: Clear to auscultation bilaterally, no wheezing, rales or rhonchi. Abdomen: Soft, nontender, nondistended, positive bowel sounds, no masses. Extremities: No clubbing, cyanosis or edema with positive pedal pulses. About 3x3cm ulcer with grayish granulation tissue on the left foot, no purulent discharge Neuro: Grossly intact, no focal neurological deficits, strength 5/5 upper and lower extremities bilaterally Psych: alert and oriented x 3, normal mood and affect Skin: See extremity exam  LABS on Admission:  Basic Metabolic Panel:  Recent Labs Lab 06/06/12 1235  NA 129*  K 5.1  CL 93*  CO2 24  GLUCOSE 191*  BUN 28*  CREATININE 1.49*  CALCIUM 9.2   Liver Function Tests:  Recent Labs Lab 06/06/12 1235  AST 19  ALT 16  ALKPHOS 164*  BILITOT 0.1*  PROT 7.4  ALBUMIN 3.1*   CBC:  Recent Labs Lab 06/06/12 1235  WBC 13.3*  HGB 8.3*  HCT 25.0*  MCV 77.2*  PLT 324   Cardiac Enzymes: No results found for this basename: CKTOTAL, CKMB, CKMBINDEX, TROPONINI,  in the last 168 hours BNP: No components found with this basename: POCBNP,  CBG: No results found for this basename: GLUCAP,  in the last 168 hours   Radiological Exams on Admission: Dg Chest 2 View  06/06/2012  *RADIOLOGY REPORT*  Clinical Data: Preoperative exam, history of hypertension and diabetes, CABG.  CHEST - 2 VIEW  Comparison: 06/08/2009  Findings: Heart size is mildly enlarged without evidence for edema. Right upper lobe granuloma is stable projecting between the right posterior fifth and sixth ribs.  Left-sided AICD in place. There is a new irregular opacity projecting in the retrocardiac space, seen best on the lateral view, with possible associated pleural thickening.  No pleural effusion.  No acute osseous finding. Kyphosis with mild loss of central vertebral body height at  multiple levels of the mid thoracic spine noted.  IMPRESSION: Left lower lobe irregular opacity, which primary diagnostic considerations include pneumonia versus neoplasm.  Chest CT with IV contrast is recommended for further evaluation.  These results will be called to the ordering clinician or representative by the Radiologist Assistant, and communication documented in the PACS Dashboard.   Original Report Authenticated By: Christiana Pellant, [name]     Assessment/Plan Principal Problem:   Diabetic foot ulcer- LEFT - Patient is cleared for surgery from cardiac standpoint per Dr. [name] - I have consulted orthopedics, Dr. Magnus Ivan on call who will notify Dr. Lajoyce Corners for possible surgery tomorrow - Will continue patient on IV Ancef - Continue heparin subcutaneous DVT prophylaxis, hold Plavix tonight   Active Problems: Mild acute kidney injury: Creatinine 1.49, baseline 1.0 - Likely worsened due to ongoing issues, per patient her metformin dose was recently increased to 3 weeks ago - I will hold metformin, Lasix, altace - Gentle hydration to avoid any fluid overload for surgery tomorrow,recheck labs      DIABETES MELLITUS, TYPE II: - Place on sliding scale insulin, carb modified diet    HYPERLIPIDEMIA- continue Lipitor    ANEMIA, IRON DEFICIENCY - Will check CBC, will transfuse if less than 8    HYPERTENSION: Currently stable continue metoprolol and isosorbide    Chronic systolic heart failure: Last echo from 2011 shows EF in the range of 30-35% - Avoid fluid overload, Lasix being held due to worsened creatinine function    Abnormal CXR (chest x-ray) - I discussed in detail with the patient and her daughter in the room, they do want to obtain CT of the chest for further workup. Will not be able to do CT of the chest with contrast due to acute renal insufficiency. The patient had not gone through with the pulmonary evaluation in the past   DVT prophylaxis:  heparin subcutaneous    CODE STATUS:   Further plan will depend as patient's clinical course evolves and further radiologic and laboratory data become available.   Time Spent on Admission: 1 hour  RAI,RIPUDEEP [name] [facility] 06/07/2012, 6:53 PM Pager: [phone]  If 7PM-7AM, please contact night-coverage [email or web address] Password [email or web address]

## 2012-06-08 ENCOUNTER — Inpatient Hospital Stay (HOSPITAL_COMMUNITY): Payer: Medicare Other | Admitting: Certified Registered"

## 2012-06-08 ENCOUNTER — Encounter (HOSPITAL_COMMUNITY): Payer: Self-pay | Admitting: Certified Registered"

## 2012-06-08 ENCOUNTER — Encounter (HOSPITAL_COMMUNITY)
Admission: RE | Disposition: A | Discharge status: Skilled Nursing Facility | Payer: Self-pay | Source: Ambulatory Visit | Attending: Internal Medicine

## 2012-06-08 ENCOUNTER — Ambulatory Visit: Payer: Medicare Other | Admitting: Cardiology

## 2012-06-08 ENCOUNTER — Encounter (HOSPITAL_COMMUNITY): Payer: Self-pay | Admitting: Vascular Surgery

## 2012-06-08 DIAGNOSIS — I5022 Chronic systolic (congestive) heart failure: Secondary | ICD-10-CM

## 2012-06-08 DIAGNOSIS — L03119 Cellulitis of unspecified part of limb: Secondary | ICD-10-CM | POA: Diagnosis not present

## 2012-06-08 DIAGNOSIS — L0390 Cellulitis, unspecified: Secondary | ICD-10-CM

## 2012-06-08 DIAGNOSIS — E1169 Type 2 diabetes mellitus with other specified complication: Principal | ICD-10-CM

## 2012-06-08 DIAGNOSIS — M86179 Other acute osteomyelitis, unspecified ankle and foot: Secondary | ICD-10-CM | POA: Diagnosis not present

## 2012-06-08 DIAGNOSIS — L97409 Non-pressure chronic ulcer of unspecified heel and midfoot with unspecified severity: Secondary | ICD-10-CM | POA: Diagnosis not present

## 2012-06-08 DIAGNOSIS — E1149 Type 2 diabetes mellitus with other diabetic neurological complication: Secondary | ICD-10-CM | POA: Diagnosis not present

## 2012-06-08 DIAGNOSIS — N179 Acute kidney failure, unspecified: Secondary | ICD-10-CM

## 2012-06-08 DIAGNOSIS — L97509 Non-pressure chronic ulcer of other part of unspecified foot with unspecified severity: Secondary | ICD-10-CM

## 2012-06-08 HISTORY — PX: AMPUTATION: SHX166

## 2012-06-08 LAB — CBC
MCH: 25 pg — ABNORMAL LOW (ref 26.0–34.0)
MCV: 76.4 fL — ABNORMAL LOW (ref 78.0–100.0)
Platelets: 316 10*3/uL (ref 150–400)
RBC: 2.8 MIL/uL — ABNORMAL LOW (ref 3.87–5.11)

## 2012-06-08 LAB — URINALYSIS, ROUTINE W REFLEX MICROSCOPIC
Glucose, UA: NEGATIVE mg/dL
Hgb urine dipstick: NEGATIVE
Protein, ur: NEGATIVE mg/dL
pH: 6.5 (ref 5.0–8.0)

## 2012-06-08 LAB — BASIC METABOLIC PANEL
CO2: 25 mEq/L (ref 19–32)
Calcium: 8.8 mg/dL (ref 8.4–10.5)
Glucose, Bld: 87 mg/dL (ref 70–99)
Sodium: 133 mEq/L — ABNORMAL LOW (ref 135–145)

## 2012-06-08 LAB — GLUCOSE, CAPILLARY
Glucose-Capillary: 91 mg/dL (ref 70–99)
Glucose-Capillary: 144 mg/dL — ABNORMAL HIGH (ref 70–99)

## 2012-06-08 LAB — RETICULOCYTES
RBC.: 2.74 MIL/uL — ABNORMAL LOW (ref 3.87–5.11)
Retic Ct Pct: 1.3 % (ref 0.4–3.1)

## 2012-06-08 LAB — IRON AND TIBC
Saturation Ratios: 7 % — ABNORMAL LOW (ref 20–55)
TIBC: 215 ug/dL — ABNORMAL LOW (ref 250–470)
UIBC: 201 ug/dL (ref 125–400)

## 2012-06-08 LAB — FERRITIN: Ferritin: 76 ng/mL (ref 10–291)

## 2012-06-08 SURGERY — AMPUTATION, FOOT, RAY
Anesthesia: General | Site: Foot | Laterality: Left | Wound class: Clean

## 2012-06-08 MED ORDER — FENTANYL CITRATE 0.05 MG/ML IJ SOLN
INTRAMUSCULAR | Status: DC | PRN
  Administered 2012-06-08: 50 ug via INTRAVENOUS

## 2012-06-08 MED ORDER — PROPRANOLOL HCL 1 MG/ML IV SOLN
INTRAVENOUS | Status: DC | PRN
  Administered 2012-06-08: .25 mg via INTRAVENOUS

## 2012-06-08 MED ORDER — MIDAZOLAM HCL 5 MG/5ML IJ SOLN
INTRAMUSCULAR | Status: DC | PRN
  Administered 2012-06-08: 1 mg via INTRAVENOUS

## 2012-06-08 MED ORDER — CEFAZOLIN SODIUM-DEXTROSE 2-3 GM-% IV SOLR
2.0000 g | Freq: Four times a day (QID) | INTRAVENOUS | Status: AC
  Administered 2012-06-08 – 2012-06-09 (×2): 2 g via INTRAVENOUS
  Filled 2012-06-08 (×3): qty 50

## 2012-06-08 MED ORDER — LACTATED RINGERS IV SOLN
INTRAVENOUS | Status: DC | PRN
Start: 2012-06-08 — End: 2012-06-08
  Administered 2012-06-08: 08:00:00 via INTRAVENOUS

## 2012-06-08 MED ORDER — ONDANSETRON HCL 4 MG/2ML IJ SOLN: 4.0000 mg | Freq: Four times a day (QID) | INTRAMUSCULAR | Status: DC | PRN

## 2012-06-08 MED ORDER — PROPOFOL INFUSION 10 MG/ML OPTIME
INTRAVENOUS | Status: DC | PRN
  Administered 2012-06-08: 50 ug/kg/min via INTRAVENOUS

## 2012-06-08 MED ORDER — METOCLOPRAMIDE HCL 10 MG PO TABS: 5.0000 mg | ORAL_TABLET | Freq: Three times a day (TID) | ORAL | Status: DC | PRN

## 2012-06-08 MED ORDER — CEFAZOLIN SODIUM-DEXTROSE 2-3 GM-% IV SOLR
INTRAVENOUS | Status: AC
  Filled 2012-06-08: qty 50

## 2012-06-08 MED ORDER — ONDANSETRON HCL 4 MG PO TABS: 4.0000 mg | ORAL_TABLET | Freq: Four times a day (QID) | ORAL | Status: DC | PRN

## 2012-06-08 MED ORDER — METOCLOPRAMIDE HCL 5 MG/ML IJ SOLN
5.0000 mg | Freq: Three times a day (TID) | INTRAMUSCULAR | Status: DC | PRN
  Filled 2012-06-08: qty 2

## 2012-06-08 MED ORDER — OXYCODONE-ACETAMINOPHEN 5-325 MG PO TABS
1.0000 | ORAL_TABLET | ORAL | Status: DC | PRN
Start: 2012-06-08 — End: 2012-06-11

## 2012-06-08 MED ORDER — 0.9 % SODIUM CHLORIDE (POUR BTL) OPTIME
TOPICAL | Status: DC | PRN
  Administered 2012-06-08: 1000 mL

## 2012-06-08 MED ORDER — HYDROCODONE-ACETAMINOPHEN 5-325 MG PO TABS: 1.0000 | ORAL_TABLET | ORAL | Status: DC | PRN

## 2012-06-08 SURGICAL SUPPLY — 43 items
BANDAGE ESMARK 6X9 LF (GAUZE/BANDAGES/DRESSINGS) IMPLANT
BANDAGE GAUZE ELAST BULKY 4 IN (GAUZE/BANDAGES/DRESSINGS) ×2 IMPLANT
BLADE SAW SGTL MED 73X18.5 STR (BLADE) ×2 IMPLANT
BNDG COHESIVE 4X5 TAN STRL (GAUZE/BANDAGES/DRESSINGS) ×2 IMPLANT
BNDG COHESIVE 6X5 TAN STRL LF (GAUZE/BANDAGES/DRESSINGS) ×2 IMPLANT
BNDG ESMARK 6X9 LF (GAUZE/BANDAGES/DRESSINGS)
CLOTH BEACON ORANGE TIMEOUT ST (SAFETY) ×2 IMPLANT
CUFF TOURNIQUET SINGLE 34IN LL (TOURNIQUET CUFF) IMPLANT
CUFF TOURNIQUET SINGLE 44IN (TOURNIQUET CUFF) IMPLANT
DRAPE U-SHAPE 47X51 STRL (DRAPES) ×4 IMPLANT
DRSG ADAPTIC 3X8 NADH LF (GAUZE/BANDAGES/DRESSINGS) ×2 IMPLANT
DRSG PAD ABDOMINAL 8X10 ST (GAUZE/BANDAGES/DRESSINGS) ×6 IMPLANT
DURAPREP 26ML APPLICATOR (WOUND CARE) ×2 IMPLANT
ELECT REM PT RETURN 9FT ADLT (ELECTROSURGICAL) ×2
ELECTRODE REM PT RTRN 9FT ADLT (ELECTROSURGICAL) ×1 IMPLANT
GLOVE BIO SURGEON STRL SZ 6.5 (GLOVE) ×2 IMPLANT
GLOVE BIOGEL PI IND STRL 6.5 (GLOVE) ×1 IMPLANT
GLOVE BIOGEL PI IND STRL 9 (GLOVE) ×1 IMPLANT
GLOVE BIOGEL PI INDICATOR 6.5 (GLOVE) ×1
GLOVE BIOGEL PI INDICATOR 9 (GLOVE) ×1
GLOVE SURG ORTHO 9.0 STRL STRW (GLOVE) ×2 IMPLANT
GOWN PREVENTION PLUS XLARGE (GOWN DISPOSABLE) ×2 IMPLANT
GOWN SRG XL XLNG 56XLVL 4 (GOWN DISPOSABLE) ×1 IMPLANT
GOWN STRL NON-REIN XL XLG LVL4 (GOWN DISPOSABLE) ×1
KIT BASIN OR (CUSTOM PROCEDURE TRAY) ×2 IMPLANT
KIT ROOM TURNOVER OR (KITS) ×2 IMPLANT
MANIFOLD NEPTUNE II (INSTRUMENTS) ×2 IMPLANT
NS IRRIG 1000ML POUR BTL (IV SOLUTION) ×2 IMPLANT
PACK ORTHO EXTREMITY (CUSTOM PROCEDURE TRAY) ×2 IMPLANT
PAD ARMBOARD 7.5X6 YLW CONV (MISCELLANEOUS) ×4 IMPLANT
PAD CAST 4YDX4 CTTN HI CHSV (CAST SUPPLIES) ×1 IMPLANT
PADDING CAST COTTON 4X4 STRL (CAST SUPPLIES) ×1
SPONGE GAUZE 4X4 12PLY (GAUZE/BANDAGES/DRESSINGS) ×2 IMPLANT
SPONGE LAP 18X18 X RAY DECT (DISPOSABLE) ×2 IMPLANT
STAPLER VISISTAT 35W (STAPLE) ×2 IMPLANT
STOCKINETTE IMPERVIOUS LG (DRAPES) IMPLANT
SUCTION FRAZIER TIP 10 FR DISP (SUCTIONS) ×2 IMPLANT
SUT ETHILON 2 0 PSLX (SUTURE) ×4 IMPLANT
TOWEL OR 17X24 6PK STRL BLUE (TOWEL DISPOSABLE) ×2 IMPLANT
TOWEL OR 17X26 10 PK STRL BLUE (TOWEL DISPOSABLE) ×2 IMPLANT
TUBE CONNECTING 12X1/4 (SUCTIONS) ×2 IMPLANT
UNDERPAD 30X30 INCONTINENT (UNDERPADS AND DIAPERS) ×2 IMPLANT
WATER STERILE IRR 1000ML POUR (IV SOLUTION) ×2 IMPLANT

## 2012-06-08 NOTE — Op Note (Signed)
OPERATIVE REPORT  DATE OF SURGERY: 06/08/2012  PATIENT:  Rodman Key,  79 y.o. female  PRE-OPERATIVE DIAGNOSIS:  Osteomyelitis 1st MT Left Foot  POST-OPERATIVE DIAGNOSIS:  Osteomyelitis 1st MT Left Foot  PROCEDURE:  Procedure(s): AMPUTATION RAY  SURGEON:  Surgeon(s): Nadara Mustard, MD  ANESTHESIA:   regional  EBL:  min ML  SPECIMEN:  Source of Specimen:  Left foot first ray  TOURNIQUET:  * No tourniquets in log *  PROCEDURE DETAILS: Patient is a 79 year old woman with a necrotic ulcer left foot great toe MTP joint. She has exposed bone with osteomyelitis and presents at this time for surgical intervention. Risks and benefits were discussed including infection neurovascular injury nonhealing of the wound need for additional surgery. Patient states she understands and wished to proceed at this time. Patient was cleared by her cardiologist and this was discussed with her family including her son-in-law. Description of procedure patient underwent a ankle block in the holding area and was brought to the operating room. After adequate levels and anesthesia obtained patient's left lower extremity was prepped using DuraPrep and draped into a sterile field. An incision was used to resect the metatarsal and the necrotic ulcer in one block of tissue. This was resected through the base of the first metatarsal. Electrocautery was used for hemostasis the wound was irrigated with normal saline there was no signs of any deep abscess or necrotic tissue. The incision was closed using 2-0 nylon. The wound was covered Adaptic orthopedic sponges AB dressing Kerlix and a compressive wrap was applied from her toes up to the tibial tubercle do to the massive venous stasis swelling. Patient was taken to the PACU in stable condition.  PLAN OF CARE: Admit to inpatient   PATIENT DISPOSITION:  PACU - hemodynamically stable.   Nadara Mustard, MD 06/08/2012 9:16 AM

## 2012-06-08 NOTE — Progress Notes (Signed)
Utilization review completed. Nekisha Mcdiarmid, RN, BSN. 

## 2012-06-08 NOTE — Anesthesia Preprocedure Evaluation (Addendum)
Anesthesia Evaluation  Patient identified by MRN, date of birth, ID band Patient awake    Reviewed: Allergy & Precautions, H&P , NPO status , Patient's Chart, lab work & pertinent test results, reviewed documented beta blocker date and time   Airway Mallampati: I TM Distance: >3 FB Neck ROM: full    Dental   Pulmonary          Cardiovascular hypertension, Pt. on home beta blockers + CAD, + Past MI, + CABG, + Peripheral Vascular Disease and +CHF + Cardiac Defibrillator Rhythm:regular Rate:Normal     Neuro/Psych CVA    GI/Hepatic   Endo/Other  diabetes, Well Controlled, Type 2, Oral Hypoglycemic Agents  Renal/GU Renal disease     Musculoskeletal   Abdominal   Peds  Hematology   Anesthesia Other Findings   Reproductive/Obstetrics                          Anesthesia Physical Anesthesia Plan  ASA: IV  Anesthesia Plan: MAC and Regional   Post-op Pain Management:    Induction: Intravenous  Airway Management Planned: Mask  Additional Equipment:   Intra-op Plan:   Post-operative Plan:   Informed Consent: I have reviewed the patients History and Physical, chart, labs and discussed the procedure including the risks, benefits and alternatives for the proposed anesthesia with the patient or authorized representative who has indicated his/her understanding and acceptance.     Plan Discussed with: CRNA, Anesthesiologist and Surgeon  Anesthesia Plan Comments:         Anesthesia Quick Evaluation

## 2012-06-08 NOTE — Preoperative (Signed)
Beta Blockers   Reason not to administer Beta Blockers:Not Applicable 

## 2012-06-08 NOTE — Progress Notes (Signed)
[facility] PROGRESS NOTE  [name] [name] BJY:[id number] DOB: 21-Mar-1932 DOA: 06/07/2012 PCP: [name], MD  HPI/Subjective: Seen after the amputation, denies any significant pain  Assessment/Plan:  Diabetic foot ulcer- LEFT  -Status post left first amputation done by Dr. Lajoyce Corners - Will continue patient on IV Ancef  - Continue heparin subcutaneous DVT prophylaxis, hold Plavix tonight  -Weight for orthopedics recommendation regarding weight bearing.  Mild acute kidney injury: Creatinine 1.49, baseline 1.0  - Likely worsened due to ongoing issues, per patient her metformin dose was recently increased to 3 weeks ago  - I will hold metformin, Lasix, altace  -DC IV fluids, has mild lower extremity edema.  DIABETES MELLITUS, TYPE II:  - Place on sliding scale insulin, carb modified diet   HYPERLIPIDEMIA- continue Lipitor   ANEMIA, IRON DEFICIENCY  - Will check CBC, check anemia panel as family mention that she supposed to get iron transfusion or  HYPERTENSION -Currently stable continue metoprolol and isosorbide  -Chronic systolic heart failure: Last echo from 2011 shows EF in the range of 30-35%  - Avoid fluid overload, Lasix being held due to worsened creatinine function   Abnormal CXR (chest x-ray)  - I discussed in detail with the patient and her daughter in the room, they do want to obtain CT of the chest for further workup. Will not be able to do CT of the chest with contrast due to acute renal insufficiency. The patient had not gone through with the pulmonary evaluation in the past    Code Status: Full code Family Communication: Plan discussed with the patient with presence of her 2 daughters Disposition Plan: Remains inpatient   Consultants:  Dr Lajoyce Corners  Procedures:  Amputation of the left first ray  Antibiotics:  Ancef    Objective: Filed Vitals:   06/08/12 0951 06/08/12 0953 06/08/12 1322 06/08/12 1433  BP: 124/50  99/39 95/45  Pulse: 64 64 59   Temp:  97.8 F (36.6 C)  97.9 F (36.6 C)   TempSrc:   Oral   Resp: 13  17   Height:      Weight:      SpO2: 100% 99% 99%     Intake/Output Summary (Last 24 hours) at 06/08/12 1518 Last data filed at 06/08/12 1322  Gross per 24 hour  Intake 1289.17 ml  Output    860 ml  Net 429.17 ml   Filed Weights   06/07/12 1755  Weight: 87.816 kg (193 lb 9.6 oz)    Exam:  General: Alert and awake, oriented x3, not in any acute distress. HEENT: anicteric sclera, pupils reactive to light and accommodation, EOMI CVS: S1-S2 clear, no murmur rubs or gallops Chest: clear to auscultation bilaterally, no wheezing, rales or rhonchi Abdomen: soft nontender, nondistended, normal bowel sounds, no organomegaly Extremities: no cyanosis, clubbing or edema noted bilaterally Neuro: Cranial nerves II-XII intact, no focal neurological deficits   Data Reviewed: Basic Metabolic Panel:  Recent Labs Lab 06/06/12 1235 06/07/12 2003 06/08/12 0634  NA 129* 131* 133*  K 5.1 4.6 4.6  CL 93* 95* 101  CO2 24 25 25   GLUCOSE 191* 159* 87  BUN 28* 23 23  CREATININE 1.49* 1.38* 1.44*  CALCIUM 9.2 9.4 8.8   Liver Function Tests:  Recent Labs Lab 06/06/12 1235  AST 19  ALT 16  ALKPHOS 164*  BILITOT 0.1*  PROT 7.4  ALBUMIN 3.1*   No results found for this basename: LIPASE, AMYLASE,  in the last 168 hours No results  found for this basename: AMMONIA,  in the last 168 hours CBC:  Recent Labs Lab 06/06/12 1235 06/07/12 2003 06/08/12 0634  WBC 13.3* 12.2* 10.1  HGB 8.3* 8.1* 7.0*  HCT 25.0* 24.3* 21.4*  MCV 77.2* 75.9* 76.4*  PLT 324 330 316   Cardiac Enzymes: No results found for this basename: CKTOTAL, CKMB, CKMBINDEX, TROPONINI,  in the last 168 hours BNP (last 3 results) No results found for this basename: PROBNP,  in the last 8760 hours CBG:  Recent Labs Lab 06/07/12 2122 06/08/12 0722 06/08/12 0928 06/08/12 1135  GLUCAP 157* 85 91 151*    Recent Results (from the past 240 hour(s))   SURGICAL PCR SCREEN     Status: None   Collection Time    06/06/12 12:34 PM      Result Value Range Status   MRSA, PCR NEGATIVE  NEGATIVE Final   Staphylococcus aureus NEGATIVE  NEGATIVE Final   Comment:            The Xpert SA Assay (FDA     approved for NASAL specimens     in patients over 55 years of age),     is one component of     a comprehensive surveillance     program.  Test performance has     been validated by [employer] for patients greater     than or equal to 30 year old.     It is not intended     to diagnose infection nor to     guide or monitor treatment.     Studies: Ct Chest Wo Contrast  06/07/2012  *RADIOLOGY REPORT*  Clinical Data: Abnormal preoperative chest x-ray.  Ill-defined density at the left lung base.  CT CHEST WITHOUT CONTRAST  Technique:  Multidetector CT imaging of the chest was performed following the standard protocol without IV contrast.  Comparison: Chest x-ray dated 04/16 1014 and chest CT dated 06/08/2009  Findings: There is an area of abnormal irregular soft tissue density measuring 5.0 x 4.1 x 3.8 cm in the posterior medial aspect of the left lower lobe.  The appearance is typical for  "rounded atelectasis" with cicatrization in the area of previous inflammation and scarring.  There is marked retraction of the posterior superior aspect of the left major fissure.  A granuloma seen on the pleura posteriorly on the prior study was 5.7 cm above the area of inflammation and scarring but is now within the area of scarring.  The left upper lobe is normal.  The right lung is clear.  There is no hilar or mediastinal adenopathy.  Heart size is normal.  No acute osseous abnormality.  The visualized portion of the upper abdomen is normal except for previously noted cholelithiasis.  IMPRESSION:   The soft tissue mass-like density at the left lung bases felt to represent an area of "rounded atelectasis", an area of cicatrization an area of prior inflammation.   This is unlikely to represent malignancy.  PET scan could be utilized for further evaluation but the appearance is classic for "rounded atelectasis".   Original Report Authenticated By: Francene Boyers, [name]     Scheduled Meds: . aspirin  325 mg Oral Daily  . atorvastatin  40 mg Oral Daily  .  ceFAZolin (ANCEF) IV  2 g Intravenous On Call to OR  . ceFAZolin      .  ceFAZolin (ANCEF) IV  1 g Intravenous Q8H  .  ceFAZolin (ANCEF) IV  2 g Intravenous Q6H  . heparin  5,000 Units Subcutaneous Q8H  . insulin aspart  0-5 Units Subcutaneous QHS  . insulin aspart  0-9 Units Subcutaneous TID WC  . isosorbide dinitrate  15 mg Oral BID  . metoprolol  50 mg Oral BID  . sodium chloride  3 mL Intravenous Q12H   Continuous Infusions: . sodium chloride 50 mL/hr at 06/08/12 1610    Principal Problem:   Diabetic foot ulcer- LEFT Active Problems:   DIABETES MELLITUS, TYPE II   HYPERLIPIDEMIA   ANEMIA, IRON DEFICIENCY   HYPERTENSION   Chronic systolic heart failure   Abnormal CXR (chest x-ray)   AKI (acute kidney injury)    Time spent: 40 minutes    [facility]  [facility] Pager [phone]. If 7PM-7AM, please contact night-coverage at [email or web address], password [facility] 06/08/2012, 3:18 PM  LOS: 1 day

## 2012-06-08 NOTE — Transfer of Care (Signed)
Immediate Anesthesia Transfer of Care Note  Patient: [name] [name]  Procedure(s) Performed: Procedure(s) with comments: AMPUTATION RAY (Left) - Left Foot 1st Ray Amputation  Patient Location: PACU  Anesthesia Type:MAC combined with regional for post-op pain  Level of Consciousness: awake, alert , oriented and patient cooperative  Airway & Oxygen Therapy: Patient Spontanous Breathing  Post-op Assessment: Report given to PACU RN, Post -op Vital signs reviewed and stable and Patient moving all extremities  Post vital signs: Reviewed and stable  Complications: No apparent anesthesia complications

## 2012-06-08 NOTE — H&P (Signed)
  Patient is a 79 year old woman diabetes insensate neuropathy with abscess ulceration left great toe. Patient was admitted for medical stabilization IV antibiotics and presents at this time for left foot first ray amputation. Risks and benefits of surgery were discussed including persistent infection nonhealing of the wound need for additional surgery. Patient states she understands and wished to proceed at this time.

## 2012-06-08 NOTE — Progress Notes (Signed)
Assisted Dr. Katrinka Blazing with left ankle block after time out was complete no sedation given.

## 2012-06-08 NOTE — Anesthesia Postprocedure Evaluation (Signed)
  Anesthesia Post-op Note  Patient: [name] [name]  Procedure(s) Performed: Procedure(s) with comments: AMPUTATION RAY (Left) - Left Foot 1st Ray Amputation  Patient Location: PACU  Anesthesia Type:MAC  Level of Consciousness: awake, alert , oriented and patient cooperative  Airway and Oxygen Therapy: Patient Spontanous Breathing  Post-op Pain: none  Post-op Assessment: Post-op Vital signs reviewed, Patient's Cardiovascular Status Stable, Respiratory Function Stable, Patent Airway, No signs of Nausea or vomiting and Pain level controlled  Post-op Vital Signs: stable  Complications: No apparent anesthesia complications

## 2012-06-08 NOTE — Progress Notes (Signed)
Orthopedic Tech Progress Note Patient Details:  [name] [name] [birth year] [id number] Post op shoe applied to Left LE. Tolerated well.  Ortho Devices Type of Ortho Device: Postop shoe/boot Ortho Device/Splint Location: Left Ortho Device/Splint Interventions: Application   [name] [name] 06/08/2012, 2:18 PM

## 2012-06-08 NOTE — Progress Notes (Signed)
[name] [name] cell number is [phone].Please call with updates.

## 2012-06-08 NOTE — Anesthesia Procedure Notes (Addendum)
Anesthesia Regional Block:  Ankle block  Pre-Anesthetic Checklist: ,, timeout performed, Correct Patient, Correct Site, Correct Laterality, Correct Procedure, Correct Position, site marked, Risks and benefits discussed,  Surgical consent,  Pre-op evaluation,  At surgeon's request and post-op pain management  Laterality: Left  Prep: Maximum Sterile Barrier Precautions used, chloraprep and alcohol swabs       Needles:  Injection technique: Single-shot      Needle Gauge: 25 and 25 G  Needle insertion depth: 7 cm   Additional Needles: Ankle block Narrative:  Start time: 06/08/2012 8:10 AM End time: 06/08/2012 8:15 AM Injection made incrementally with aspirations every 5 mL.  Performed by: Personally  Anesthesiologist: Maren Beach MD  Additional Notes: Pt accepts procedure and risks. 40cc ( 20cc 0.5% Marcaine w/ epi and 20cc 2% Lidocaine ) Ant and Post Tibial Nerves w/o difficulty or discomfort. GES   Procedure Name: MAC Date/Time: 06/08/2012 8:37 AM Performed by: Jerilee Hoh Pre-anesthesia Checklist: Patient identified, Emergency Drugs available, Suction available and Patient being monitored Patient Re-evaluated:Patient Re-evaluated prior to inductionOxygen Delivery Method: Simple face mask Intubation Type: IV induction Placement Confirmation: positive ETCO2 and breath sounds checked- equal and bilateral

## 2012-06-09 ENCOUNTER — Other Ambulatory Visit: Payer: Self-pay | Admitting: Internal Medicine

## 2012-06-09 DIAGNOSIS — D509 Iron deficiency anemia, unspecified: Secondary | ICD-10-CM

## 2012-06-09 LAB — CBC
Hemoglobin: 6.9 g/dL — CL (ref 12.0–15.0)
MCH: 25.5 pg — ABNORMAL LOW (ref 26.0–34.0)
MCHC: 33.2 g/dL (ref 30.0–36.0)
Platelets: 310 10*3/uL (ref 150–400)
RDW: 14.6 % (ref 11.5–15.5)

## 2012-06-09 LAB — URINE CULTURE

## 2012-06-09 LAB — PREPARE RBC (CROSSMATCH)

## 2012-06-09 LAB — BASIC METABOLIC PANEL
BUN: 17 mg/dL (ref 6–23)
Calcium: 8.6 mg/dL (ref 8.4–10.5)
Creatinine, Ser: 1.22 mg/dL — ABNORMAL HIGH (ref 0.50–1.10)
GFR calc non Af Amer: 42 mL/min — ABNORMAL LOW (ref >90)
Glucose, Bld: 107 mg/dL — ABNORMAL HIGH (ref 70–99)

## 2012-06-09 LAB — ABO/RH: ABO/RH(D): O NEG

## 2012-06-09 LAB — GLUCOSE, CAPILLARY
Glucose-Capillary: 95 mg/dL (ref 70–99)
Glucose-Capillary: 165 mg/dL — ABNORMAL HIGH (ref 70–99)

## 2012-06-09 MED ORDER — SODIUM CHLORIDE 0.9 % IV SOLN
25.0000 mg | Freq: Once | INTRAVENOUS | Status: AC
  Administered 2012-06-09: 25 mg via INTRAVENOUS
  Filled 2012-06-09 (×3): qty 0.5

## 2012-06-09 MED ORDER — FUROSEMIDE 10 MG/ML IJ SOLN
40.0000 mg | Freq: Once | INTRAMUSCULAR | Status: DC
  Filled 2012-06-09 (×3): qty 4

## 2012-06-09 MED ORDER — SODIUM CHLORIDE 0.9 % IV SOLN
500.0000 mg | Freq: Every day | INTRAVENOUS | Status: AC
  Administered 2012-06-09 – 2012-06-10 (×2): 500 mg via INTRAVENOUS
  Filled 2012-06-09 (×4): qty 10

## 2012-06-09 NOTE — Evaluation (Signed)
Physical Therapy Evaluation Patient Details Name: [name] [name] MRN: [id number] DOB: 08/20/1932 Today's Date: 06/09/2012 Time: 8295-6213 PT Time Calculation (min): 30 min  PT Assessment / Plan / Recommendation Clinical Impression  Pt adm for lt great toe amputation.  Needs skiilled PT to maximize I and safety so pt can eventually return home.Currently feel pt could benefit from ST-SNF.  Has supportive family and would need 24 hour significant physical assist if pt went directly home. Pt is also to minimize wt bearing on lt foot which will be difficult for her.    PT Assessment  Patient needs continued PT services    Follow Up Recommendations  SNF    Does the patient have the potential to tolerate intense rehabilitation      Barriers to Discharge Decreased caregiver support      Equipment Recommendations  Wheelchair (measurements PT);Wheelchair cushion (measurements PT)    Recommendations for Other Services OT consult   Frequency Min 3X/week    Precautions / Restrictions Precautions Precautions: Fall Required Braces or Orthoses: Other Brace/Splint Other Brace/Splint: Post-op shoe on when mobilizing. Restrictions Weight Bearing Restrictions: Yes Other Position/Activity Restrictions: Minimizing weight bearing on lt foot and use post-op shoe.   Pertinent Vitals/Pain See flow sheet.      Mobility  Bed Mobility Bed Mobility: Supine to Sit;Sitting - Scoot to Edge of Bed Supine to Sit: 4: Min assist Sitting - Scoot to Edge of Bed: 4: Min assist Details for Bed Mobility Assistance: verbal cues for technique.  Assist to bring trunk up and hips to EOB. Transfers Transfers: Sit to Stand;Stand to Sit;Stand Pivot Transfers Sit to Stand: With upper extremity assist;3: Mod assist;From elevated surface;From bed Stand to Sit: 3: Mod assist;With upper extremity assist;To chair/3-in-1 Stand Pivot Transfers: 3: Mod assist Transfer via Lift Equipment: Stedy Details for Transfer  Assistance: Pt unable to come to standing using walker with one person assist.  Raised bed and used stedy to achieve standing.    Exercises     PT Diagnosis: Difficulty walking;Generalized weakness;Acute pain  PT Problem List: Decreased strength;Decreased activity tolerance;Decreased balance;Decreased mobility;Decreased knowledge of use of DME;Decreased knowledge of precautions;Pain PT Treatment Interventions: DME instruction;Gait training;Patient/family education;Functional mobility training;Therapeutic activities;Therapeutic exercise;Balance training   PT Goals Acute Rehab PT Goals PT Goal Formulation: With patient Time For Goal Achievement: 06/16/12 Potential to Achieve Goals: Good Pt will go Supine/Side to Sit: with supervision PT Goal: Supine/Side to Sit - Progress: Goal set today Pt will go Sit to Supine/Side: with supervision PT Goal: Sit to Supine/Side - Progress: Goal set today Pt will go Sit to Stand: with min assist PT Goal: Sit to Stand - Progress: Goal set today Pt will go Stand to Sit: with min assist PT Goal: Stand to Sit - Progress: Goal set today Pt will Transfer Bed to Chair/Chair to Bed: with min assist PT Transfer Goal: Bed to Chair/Chair to Bed - Progress: Goal set today Pt will Ambulate: 1 - 15 feet;with min assist;with least restrictive assistive device PT Goal: Ambulate - Progress: Goal set today  Visit Information  Last PT Received On: 06/09/12 Assistance Needed: +2    Subjective Data  Subjective: "This isn't going to be as easy as I thought," pt stated about mobiity. Patient Stated Goal: Return home   Prior Functioning  Home Living Lives With: Alone Available Help at Discharge: Family Type of Home: House Home Access: Stairs to enter [employer] of Steps: 3-4 Entrance Stairs-Rails: Right Home Layout: One level Bathroom Shower/Tub: Psychologist, counselling  Home Adaptive Equipment: Walker - rolling Prior Function Level of Independence: Independent  with assistive device(s) Vocation: Retired Musician: No difficulties    Copywriter, advertising Arousal/Alertness: Awake/alert Behavior During Therapy: WFL for tasks assessed/performed Overall Cognitive Status: Within Functional Limits for tasks assessed    Extremity/Trunk Assessment Right Lower Extremity Assessment RLE ROM/Strength/Tone: Deficits RLE ROM/Strength/Tone Deficits: grossly 3+/5 Left Lower Extremity Assessment LLE ROM/Strength/Tone: Deficits LLE ROM/Strength/Tone Deficits: grossly 3-/5:  ROM limited by post-op wrap   Balance    End of Session PT - End of Session Equipment Utilized During Treatment: Gait belt;Other (comment) (post-op shoe, stedy) Activity Tolerance: Patient limited by fatigue Patient left: in chair;with call bell/phone within reach Nurse Communication: Mobility status;Need for lift equipment  GP     Kaylena Pacifico 06/09/2012, 11:15 AM  [facility] PT [phone]

## 2012-06-09 NOTE — Progress Notes (Signed)
Patient ID: [name] [name], female   DOB: December 11, 1932, 79 y.o.   MRN: [id number] Postoperative day 1 left foot first ray amputation. Patient is comfortable. Physical therapy minimize weightbearing left lower extremity with postoperative shoe in place. Okay for discharge when she is medically stable.

## 2012-06-09 NOTE — Progress Notes (Signed)
[facility] PROGRESS NOTE  [name] [name] ION:[id number] DOB: [birth year] DOA: 06/07/2012 PCP: [name], MD  HPI/Subjective: Feels much better, denies any fever or chills.  Assessment/Plan:  Diabetic foot ulcer- LEFT  -Status post left first amputation done by Dr. Lajoyce Corners -Will continue patient on IV Ancef  -Continue heparin subcutaneous DVT prophylaxis. -PT/OT.  Mild acute kidney injury: Creatinine 1.49, baseline 1.0  -Likely worsened due to ongoing issues, per patient her metformin dose was recently increased to 3 weeks ago  -I will hold metformin, Lasix, altace  -DC IV fluids, has mild lower extremity edema.  DIABETES MELLITUS, TYPE II:  -Place on sliding scale insulin, carb modified diet   HYPERLIPIDEMIA continue Lipitor   ANEMIA, IRON DEFICIENCY  -Hemoglobin is 6.9, all transfuse 2 units of packed RBCs. -Patient and family concerned about iron transfusion the patient was supposed to get as outpatient. -I will infuse 500 mg of IV iron today.  HYPERTENSION -Currently stable continue metoprolol and isosorbide  -Chronic systolic heart failure: Last echo from 2011 shows EF in the range of 30-35%  - Avoid fluid overload, Lasix being held due to worsened creatinine function   Abnormal CXR (chest x-ray)  - I discussed in detail with the patient and her daughter in the room, they do want to obtain CT of the chest for further workup. Will not be able to do CT of the chest with contrast due to acute renal insufficiency. The patient had not gone through with the pulmonary evaluation in the past    Code Status: Full code Family Communication:  Disposition Plan: Remains inpatient, PT/OT recommended SNF   Consultants:  Dr Lajoyce Corners  Procedures:  Amputation of the left first ray  Antibiotics:  Ancef    Objective: Filed Vitals:   06/08/12 1322 06/08/12 1433 06/08/12 2132 06/09/12 0431  BP: 99/39 95/45 113/64 128/69  Pulse: 59  63 68  Temp: 97.9 F (36.6 C)  98.2 F  (36.8 C) 99 F (37.2 C)  TempSrc: Oral  Oral Oral  Resp: 17  18 20   Height:      Weight:      SpO2: 99%  94% 92%    Intake/Output Summary (Last 24 hours) at 06/09/12 1124 Last data filed at 06/09/12 0600  Gross per 24 hour  Intake    650 ml  Output   1200 ml  Net   -550 ml   Filed Weights   06/07/12 1755  Weight: 87.816 kg (193 lb 9.6 oz)    Exam:  General: Alert and awake, oriented x3, not in any acute distress. HEENT: anicteric sclera, pupils reactive to light and accommodation, EOMI CVS: S1-S2 clear, no murmur rubs or gallops Chest: clear to auscultation bilaterally, no wheezing, rales or rhonchi Abdomen: soft nontender, nondistended, normal bowel sounds, no organomegaly Extremities: no cyanosis, clubbing or edema noted bilaterally Neuro: Cranial nerves II-XII intact, no focal neurological deficits   Data Reviewed: Basic Metabolic Panel:  Recent Labs Lab 06/06/12 1235 06/07/12 2003 06/08/12 0634 06/09/12 0625  NA 129* 131* 133* 135  K 5.1 4.6 4.6 4.5  CL 93* 95* 101 102  CO2 24 25 25 26   GLUCOSE 191* 159* 87 107*  BUN 28* 23 23 17   CREATININE 1.49* 1.38* 1.44* 1.22*  CALCIUM 9.2 9.4 8.8 8.6   Liver Function Tests:  Recent Labs Lab 06/06/12 1235  AST 19  ALT 16  ALKPHOS 164*  BILITOT 0.1*  PROT 7.4  ALBUMIN 3.1*   No results found for this  basename: LIPASE, AMYLASE,  in the last 168 hours No results found for this basename: AMMONIA,  in the last 168 hours CBC:  Recent Labs Lab 06/06/12 1235 06/07/12 2003 06/08/12 0634 06/09/12 0625  WBC 13.3* 12.2* 10.1 11.0*  HGB 8.3* 8.1* 7.0* 6.9*  HCT 25.0* 24.3* 21.4* 20.8*  MCV 77.2* 75.9* 76.4* 76.8*  PLT 324 330 316 310   Cardiac Enzymes: No results found for this basename: CKTOTAL, CKMB, CKMBINDEX, TROPONINI,  in the last 168 hours BNP (last 3 results) No results found for this basename: PROBNP,  in the last 8760 hours CBG:  Recent Labs Lab 06/08/12 0928 06/08/12 1135 06/08/12 1707  06/08/12 2201 06/09/12 0757  GLUCAP 91 151* 144* 133* 95    Recent Results (from the past 240 hour(s))  SURGICAL PCR SCREEN     Status: None   Collection Time    06/06/12 12:34 PM      Result Value Range Status   MRSA, PCR NEGATIVE  NEGATIVE Final   Staphylococcus aureus NEGATIVE  NEGATIVE Final   Comment:            The Xpert SA Assay (FDA     approved for NASAL specimens     in patients over 86 years of age),     is one component of     a comprehensive surveillance     program.  Test performance has     been validated by [employer] for patients greater     than or equal to 12 year old.     It is not intended     to diagnose infection nor to     guide or monitor treatment.     Studies: Ct Chest Wo Contrast  06/07/2012  *RADIOLOGY REPORT*  Clinical Data: Abnormal preoperative chest x-ray.  Ill-defined density at the left lung base.  CT CHEST WITHOUT CONTRAST  Technique:  Multidetector CT imaging of the chest was performed following the standard protocol without IV contrast.  Comparison: Chest x-ray dated 04/16 1014 and chest CT dated 06/08/2009  Findings: There is an area of abnormal irregular soft tissue density measuring 5.0 x 4.1 x 3.8 cm in the posterior medial aspect of the left lower lobe.  The appearance is typical for  "rounded atelectasis" with cicatrization in the area of previous inflammation and scarring.  There is marked retraction of the posterior superior aspect of the left major fissure.  A granuloma seen on the pleura posteriorly on the prior study was 5.7 cm above the area of inflammation and scarring but is now within the area of scarring.  The left upper lobe is normal.  The right lung is clear.  There is no hilar or mediastinal adenopathy.  Heart size is normal.  No acute osseous abnormality.  The visualized portion of the upper abdomen is normal except for previously noted cholelithiasis.  IMPRESSION:   The soft tissue mass-like density at the left lung bases  felt to represent an area of "rounded atelectasis", an area of cicatrization an area of prior inflammation.  This is unlikely to represent malignancy.  PET scan could be utilized for further evaluation but the appearance is classic for "rounded atelectasis".   Original Report Authenticated By: Francene Boyers, [name]     Scheduled Meds: . aspirin  325 mg Oral Daily  . atorvastatin  40 mg Oral Daily  .  ceFAZolin (ANCEF) IV  1 g Intravenous Q8H  . heparin  5,000 Units  Subcutaneous Q8H  . insulin aspart  0-5 Units Subcutaneous QHS  . insulin aspart  0-9 Units Subcutaneous TID WC  . isosorbide dinitrate  15 mg Oral BID  . metoprolol  50 mg Oral BID  . sodium chloride  3 mL Intravenous Q12H   Continuous Infusions:    Principal Problem:   Diabetic foot ulcer- LEFT Active Problems:   DIABETES MELLITUS, TYPE II   HYPERLIPIDEMIA   ANEMIA, IRON DEFICIENCY   HYPERTENSION   Chronic systolic heart failure   Abnormal CXR (chest x-ray)   AKI (acute kidney injury)    Time spent: 40 minutes    [facility] A  [facility] Pager [phone]. If 7PM-7AM, please contact night-coverage at [email or web address], password [facility] 06/09/2012, 11:24 AM  LOS: 2 days

## 2012-06-09 NOTE — Progress Notes (Signed)
Physical Therapy Treatment Patient Details Name: [name] [name] MRN: [id number] DOB: Apr 29, 1932 Today's Date: 06/09/2012 Time: 2440-1027 PT Time Calculation (min): 12 min  PT Assessment / Plan / Recommendation Comments on Treatment Session  Pt adm with lt great toe amputation.  Pt fearful about falling and didn't want nurse tech to assist back to bed on Stedy.  Nurse tech and myself assisted pt with transfer.  Reassured pt that nursing was trained on the use of the [facility] and encouraged her to get up with them.    Follow Up Recommendations  SNF     Does the patient have the potential to tolerate intense rehabilitation     Barriers to Discharge Decreased caregiver support      Equipment Recommendations  Wheelchair (measurements PT);Wheelchair cushion (measurements PT)    Recommendations for Other Services OT consult  Frequency Min 3X/week   Plan Discharge plan remains appropriate;Frequency remains appropriate    Precautions / Restrictions Precautions Precautions: Fall Required Braces or Orthoses: Other Brace/Splint Other Brace/Splint: Post-op shoe on when mobilizing. Restrictions Weight Bearing Restrictions: Yes Other Position/Activity Restrictions: Minimizing weight bearing on lt foot and use post-op shoe.   Pertinent Vitals/Pain No c/o's    Mobility  Bed Mobility Bed Mobility: Supine to Sit;Sitting - Scoot to Edge of Bed Supine to Sit: 4: Min assist Sitting - Scoot to Edge of Bed: 4: Min assist Details for Bed Mobility Assistance: verbal cues for technique.  Assist to bring trunk up and hips to EOB. Transfers Transfers: Sit to Stand;Stand to Sit;Stand Pivot Transfers Sit to Stand: 1: +2 Total assist;With upper extremity assist;From chair/3-in-1 Sit to Stand: Patient Percentage: 60% Stand to Sit: 4: Min assist;With upper extremity assist;To bed Stand Pivot Transfers: 1: +2 Total assist Stand Pivot Transfers: Patient Percentage: 60% Transfer via Lift Equipment:  Stedy Details for Transfer Assistance: Used Stedy to assist pt back to bed.    Exercises     PT Diagnosis: Difficulty walking;Generalized weakness;Acute pain  PT Problem List: Decreased strength;Decreased activity tolerance;Decreased balance;Decreased mobility;Decreased knowledge of use of DME;Decreased knowledge of precautions;Pain PT Treatment Interventions: DME instruction;Gait training;Patient/family education;Functional mobility training;Therapeutic activities;Therapeutic exercise;Balance training   PT Goals Acute Rehab PT Goals PT Goal Formulation: With patient Time For Goal Achievement: 06/16/12 Potential to Achieve Goals: Good Pt will go Supine/Side to Sit: with supervision PT Goal: Supine/Side to Sit - Progress: Goal set today Pt will go Sit to Supine/Side: with supervision PT Goal: Sit to Supine/Side - Progress: Progressing toward goal Pt will go Sit to Stand: with min assist PT Goal: Sit to Stand - Progress: Progressing toward goal Pt will go Stand to Sit: with min assist PT Goal: Stand to Sit - Progress: Progressing toward goal Pt will Transfer Bed to Chair/Chair to Bed: with min assist PT Transfer Goal: Bed to Chair/Chair to Bed - Progress: Progressing toward goal Pt will Ambulate: 1 - 15 feet;with min assist;with least restrictive assistive device PT Goal: Ambulate - Progress: Goal set today  Visit Information  Last PT Received On: 06/09/12 Assistance Needed: +2    Subjective Data  Subjective: "Thank you," pt stated. Patient Stated Goal: Return home   Cognition  Cognition Arousal/Alertness: Awake/alert Behavior During Therapy: WFL for tasks assessed/performed Overall Cognitive Status: Within Functional Limits for tasks assessed    Balance  Balance Balance Assessed: Yes Static Standing Balance Static Standing - Balance Support: Bilateral upper extremity supported Static Standing - Level of Assistance: 4: Min assist Static Standing - Comment/# of Minutes:  Stood on  stedy x 30 secs with verbal cues to fully extend trunk and hips.  End of Session PT - End of Session Equipment Utilized During Treatment: Gait belt;Other (comment) (post op shoe, stedy) Activity Tolerance: Patient limited by fatigue Patient left: in bed;with call bell/phone within reach;with family/visitor present Nurse Communication: Mobility status;Need for lift equipment   GP     Romario Tith 06/09/2012, 2:32 PM  [facility] PT [phone]

## 2012-06-10 ENCOUNTER — Inpatient Hospital Stay (HOSPITAL_COMMUNITY): Payer: Medicare Other

## 2012-06-10 DIAGNOSIS — E119 Type 2 diabetes mellitus without complications: Secondary | ICD-10-CM

## 2012-06-10 LAB — CBC
HCT: 26 % — ABNORMAL LOW (ref 36.0–46.0)
RBC: 3.35 MIL/uL — ABNORMAL LOW (ref 3.87–5.11)
RDW: 14.7 % (ref 11.5–15.5)
WBC: 14.6 10*3/uL — ABNORMAL HIGH (ref 4.0–10.5)

## 2012-06-10 LAB — BASIC METABOLIC PANEL
Chloride: 100 mEq/L (ref 96–112)
Creatinine, Ser: 0.98 mg/dL (ref 0.50–1.10)
GFR calc Af Amer: 63 mL/min — ABNORMAL LOW (ref >90)
Potassium: 3.9 mEq/L (ref 3.5–5.1)
Sodium: 132 mEq/L — ABNORMAL LOW (ref 135–145)

## 2012-06-10 LAB — GLUCOSE, CAPILLARY
Glucose-Capillary: 171 mg/dL — ABNORMAL HIGH (ref 70–99)
Glucose-Capillary: 205 mg/dL — ABNORMAL HIGH (ref 70–99)

## 2012-06-10 LAB — PRO B NATRIURETIC PEPTIDE: Pro B Natriuretic peptide (BNP): 9026 pg/mL — ABNORMAL HIGH (ref 0–450)

## 2012-06-10 MED ORDER — FUROSEMIDE 10 MG/ML IJ SOLN
40.0000 mg | Freq: Once | INTRAMUSCULAR | Status: AC
  Administered 2012-06-10: 40 mg via INTRAVENOUS

## 2012-06-10 MED ORDER — POTASSIUM CHLORIDE CRYS ER 20 MEQ PO TBCR
40.0000 meq | EXTENDED_RELEASE_TABLET | Freq: Once | ORAL | Status: AC
  Administered 2012-06-10: 40 meq via ORAL
  Filled 2012-06-10: qty 2

## 2012-06-10 MED ORDER — DEXTROSE 5 % IV SOLN
1.0000 g | INTRAVENOUS | Status: DC
  Administered 2012-06-10: 1 g via INTRAVENOUS
  Filled 2012-06-10 (×2): qty 10

## 2012-06-10 NOTE — Clinical Social Work Placement (Addendum)
Clinical Social Work Department CLINICAL SOCIAL WORK PLACEMENT NOTE 06/10/2012  Patient:  ORILLA, TEMPLEMAN  Account Number:  [id number] Admit date:  06/07/2012  Clinical Social Worker:  Oswaldo Done  Date/time:  06/10/2012 04:43 PM  Clinical Social Work is seeking post-discharge placement for this patient at the following level of care:   SKILLED NURSING   (*CSW will update this form in Epic as items are completed)   06/10/2012  Patient/family provided with [facility] Department of Clinical Social Work's list of facilities offering this level of care within the geographic area requested by the patient (or if unable, by the patient's family).  06/10/2012  Patient/family informed of their freedom to choose among providers that offer the needed level of care, that participate in Medicare, Medicaid or managed care program needed by the patient, have an available bed and are willing to accept the patient.  06/10/2012  Patient/family informed of [facility]' ownership interest in [facility] as well as of the fact that they are under no obligation to receive care at this facility.  PASARR submitted to EDS on 06/10/2012 PASARR number received from EDS on   FL2 transmitted to all facilities in geographic area requested by pt/family on  06/10/2012 FL2 transmitted to all facilities within larger geographic area on   Patient informed that his/her managed care company has contracts with or will negotiate with  certain facilities, including the following:     Patient/family informed of bed offers received:   Patient chooses bed at [facility] Physician recommends and patient chooses bed at    Patient to be transferred to [facility] on 06/11/12   Patient to be transferred to facility by ambulance  The following physician request were entered in Epic:   Additional Comments:  Ricke Hey, Theresia Majors [phone] (weekend)

## 2012-06-10 NOTE — Progress Notes (Signed)
Patient ID: [name] [name], female   DOB: [birth year], 79 y.o.   MRN: [id number] Patient is comfortable. Anticipate discharge to skilled nursing facility. I will followup in the office in 2 weeks. Keep dressing clean dry and intact. Minimize weightbearing left foot

## 2012-06-10 NOTE — Progress Notes (Signed)
Making report rounds, found patient to be "uncomfortable" she stated.  This patient just completed 2nd unit of blood in last hour.  Noted respirations somewhat labored at rate of 24, wheezing and crackles audible, 02 sats 77% placed on 02 2lpm without change in sats, 02 increased to 4lpm with some improvement of sats to low 80's.  BP high 160/96, HR 110, denied chest pain but sweaty.  Call to Dr Arthor Captain with above info.  Started EKG, Dr Arthor Captain on unit, Lasix 40 IV and CXR ordered and done.  Placed on nonrebreather mask, Rapid Response called at 0730.  Slowly patient improved after Lasix and back to this patients norm about 0800. 02 n/c re applied at 2lpm with sats in high 90's.  Rapid Response left about 0820 with patient back to norm and eating breakfast.  Pending are BNP results.  Bonney Leitz

## 2012-06-10 NOTE — Clinical Social Work Psychosocial (Signed)
Clinical Social Work Department BRIEF PSYCHOSOCIAL ASSESSMENT 06/10/2012  Patient:  [name] [name], [name] [name]     Account Number:  [id number]     Admit date:  06/07/2012  Clinical Social Worker:  Oswaldo Done  Date/Time:  06/10/2012 04:33 PM  Referred by:  Physician  Date Referred:  06/09/2012 Referred for  SNF Placement   Other Referral:   Interview type:   Other interview type:    PSYCHOSOCIAL DATA Living Status:  WITH ADULT CHILDREN Admitted from facility:   Level of care:   Primary support name:  [name] [name] Primary support relationship to patient:  CHILD, ADULT Degree of support available:   Adequate    CURRENT CONCERNS Current Concerns  Post-Acute Placement   Other Concerns:    SOCIAL WORK ASSESSMENT / PLAN CSW met with patient and son, [name] [name] at bedside. Patient lives with son, [name] [name], however he works full time and cannot stay with the patient during the day. Patient has 3 adult daughters and 3 adult sons. Daughter, [name] [name], is the only adult child who is retired. [name] [name] checks on patient regularly and assists her as she is able. However, patient is realizing that she may benefit from short term rehab prior to returning home. Patient would like to to go to [name] [name]. Backup choices are [name] [name] or [name] [name].   Assessment/plan status:   Other assessment/ plan:   Information/referral to community resources:   SNF List    PATIENT'S/FAMILY'S RESPONSE TO PLAN OF CARE: Patient thanked CSW for assisting with SNF bed search. Weekday CSW to follow up with bed offers.        Ricke Hey, [place] [phone] (weekend)

## 2012-06-10 NOTE — Progress Notes (Signed)
[facility] PROGRESS NOTE  JADYN BRASHER ZOX:[id number] DOB: [birth year] DOA: 06/07/2012 PCP: [name], MD  HPI/Subjective: Had significant shortness of breath this morning. Exam shows congestion, patient responded very well to IV Lasix. Has mild leukocytosis and low-grade fever, chest x-ray is negative, check daily and started empirically on Rocephin.  Assessment/Plan:  Diabetic foot ulcer- LEFT  -Status post left first amputation done by Dr. Lajoyce Corners -Will continue patient on IV Ancef  -Continue heparin subcutaneous DVT prophylaxis. -PT/OT.  SOB -Likely secondary to mild fluid overload from blood and iron transfusion. -Patient responded very well to IV Lasix, repeat Lasix later today, check 2-D echo and BNP. -Check BMP in a.m. Potential discharge in a.m. to skilled nursing facility.  Mild acute kidney injury: Creatinine 1.49, baseline 1.0  -Likely worsened due to ongoing issues, per patient her metformin dose was recently increased to 3 weeks ago  -I will hold metformin, Lasix, altace  -DC IV fluids, has mild lower extremity edema.  DIABETES MELLITUS, TYPE II:  -Place on sliding scale insulin, carb modified diet   HYPERLIPIDEMIA continue Lipitor   ANEMIA, IRON DEFICIENCY  -Hemoglobin is 6.9, all transfuse 2 units of packed RBCs. Hemoglobin today is 8.7. -Patient and family concerned about iron transfusion the patient was supposed to get as outpatient. -Status post infusion of 500 mg of IV iron.  HYPERTENSION -Currently stable continue metoprolol and isosorbide  -Chronic systolic heart failure: Last echo from 2011 shows EF in the range of 30-35%  - Avoid fluid overload, Lasix being held due to worsened creatinine function   Abnormal CXR (chest x-ray)  - I discussed in detail with the patient and her daughter in the room, they do want to obtain CT of the chest for further workup. Will not be able to do CT of the chest with contrast due to acute renal insufficiency. The  patient had not gone through with the pulmonary evaluation in the past    Code Status: Full code Family Communication:  Disposition Plan: Remains inpatient, PT/OT recommended SNF   Consultants:  Dr Lajoyce Corners  Procedures:  Amputation of the left first ray  Antibiotics:  Ancef==> switched to Rocephin on 06/10/2012.    Objective: Filed Vitals:   06/10/12 0756 06/10/12 0804 06/10/12 0810 06/10/12 0817  BP: 142/69   136/78  Pulse: 98  101 97  Temp:      TempSrc:      Resp:      Height:      Weight:      SpO2: 96% 86% 93% 93%    Intake/Output Summary (Last 24 hours) at 06/10/12 1138 Last data filed at 06/10/12 1101  Gross per 24 hour  Intake    888 ml  Output   1350 ml  Net   -462 ml   Filed Weights   06/07/12 1755  Weight: 87.816 kg (193 lb 9.6 oz)    Exam:  General: Alert and awake, oriented x3, not in any acute distress. HEENT: anicteric sclera, pupils reactive to light and accommodation, EOMI CVS: S1-S2 clear, no murmur rubs or gallops Chest: clear to auscultation bilaterally, no wheezing, rales or rhonchi Abdomen: soft nontender, nondistended, normal bowel sounds, no organomegaly Extremities: no cyanosis, clubbing or edema noted bilaterally Neuro: Cranial nerves II-XII intact, no focal neurological deficits   Data Reviewed: Basic Metabolic Panel:  Recent Labs Lab 06/06/12 1235 06/07/12 2003 06/08/12 0634 06/09/12 0625 06/10/12 0625  NA 129* 131* 133* 135 132*  K 5.1 4.6 4.6 4.5 3.9  CL 93* 95* 101 102 100  CO2 24 25 25 26 24   GLUCOSE 191* 159* 87 107* 155*  BUN 28* 23 23 17 13   CREATININE 1.49* 1.38* 1.44* 1.22* 0.98  CALCIUM 9.2 9.4 8.8 8.6 8.6   Liver Function Tests:  Recent Labs Lab 06/06/12 1235  AST 19  ALT 16  ALKPHOS 164*  BILITOT 0.1*  PROT 7.4  ALBUMIN 3.1*   No results found for this basename: LIPASE, AMYLASE,  in the last 168 hours No results found for this basename: AMMONIA,  in the last 168 hours CBC:  Recent Labs Lab  06/06/12 1235 06/07/12 2003 06/08/12 0634 06/09/12 0625 06/10/12 0625  WBC 13.3* 12.2* 10.1 11.0* 14.6*  HGB 8.3* 8.1* 7.0* 6.9* 8.7*  HCT 25.0* 24.3* 21.4* 20.8* 26.0*  MCV 77.2* 75.9* 76.4* 76.8* 77.6*  PLT 324 330 316 310 316   Cardiac Enzymes: No results found for this basename: CKTOTAL, CKMB, CKMBINDEX, TROPONINI,  in the last 168 hours BNP (last 3 results) No results found for this basename: PROBNP,  in the last 8760 hours CBG:  Recent Labs Lab 06/09/12 0757 06/09/12 1148 06/09/12 1723 06/09/12 2242 06/10/12 0750  GLUCAP 95 142* 158* 165* 171*    Recent Results (from the past 240 hour(s))  SURGICAL PCR SCREEN     Status: None   Collection Time    06/06/12 12:34 PM      Result Value Range Status   MRSA, PCR NEGATIVE  NEGATIVE Final   Staphylococcus aureus NEGATIVE  NEGATIVE Final   Comment:            The Xpert SA Assay (FDA     approved for NASAL specimens     in patients over 76 years of age),     is one component of     a comprehensive surveillance     program.  Test performance has     been validated by [employer] for patients greater     than or equal to 5 year old.     It is not intended     to diagnose infection nor to     guide or monitor treatment.  URINE CULTURE     Status: None   Collection Time    06/08/12  9:04 AM      Result Value Range Status   Specimen Description URINE, CATHETERIZED   Final   Special Requests NONE   Final   Culture  Setup Time 06/08/2012 09:55   Final   Colony Count NO GROWTH   Final   Culture NO GROWTH   Final   Report Status 06/09/2012 FINAL   Final     Studies: Dg Chest Port 1v Same Day  06/10/2012  *RADIOLOGY REPORT*  Clinical Data:  Short of breath  PORTABLE CHEST - 1 VIEW SAME DAY  Comparison: Prior chest x-ray 06/06/2012; prior CT chest 06/07/2012  Findings: Stable position of left subclavian approach cardiac rhythm maintenance device.  Lead projects over the right ventricle. Status post mediastinotomy  with evidence of prior multivessel CABG. Aortic atherosclerosis again noted.  No additional support apparatus.  Increased pulmonary vascular congestion now with mild interstitial edema.  Increasing bibasilar opacities may reflect a combination of pleural fluid and rounded atelectasis on the left. No pneumothorax.  IMPRESSION:  1.  Increasing pulmonary vascular congestion now with mild interstitial edema. 2.  Worsening left greater than right basilar opacities likely reflect a combination of edema, atelectasis and  perhaps small developing pleural effusions. Additionally, there is rounded atelectasis in the on the left as seen on prior CT.   Original Report Authenticated By: Malachy Moan, [name]     Scheduled Meds: . aspirin  325 mg Oral Daily  . atorvastatin  40 mg Oral Daily  . cefTRIAXone (ROCEPHIN)  IV  1 g Intravenous Q24H  . furosemide  40 mg Intravenous Once  . heparin  5,000 Units Subcutaneous Q8H  . insulin aspart  0-5 Units Subcutaneous QHS  . insulin aspart  0-9 Units Subcutaneous TID WC  . iron dextran (INFED/DEXFERRRUM) 500 MG IVPB  500 mg Intravenous Daily  . isosorbide dinitrate  15 mg Oral BID  . metoprolol  50 mg Oral BID  . sodium chloride  3 mL Intravenous Q12H   Continuous Infusions:    Principal Problem:   Diabetic foot ulcer- LEFT Active Problems:   DIABETES MELLITUS, TYPE II   HYPERLIPIDEMIA   ANEMIA, IRON DEFICIENCY   HYPERTENSION   Chronic systolic heart failure   Abnormal CXR (chest x-ray)   AKI (acute kidney injury)    Time spent: 40 minutes    [facility]  [facility] Pager [phone]. If 7PM-7AM, please contact night-coverage at [email or web address], password [facility] 06/10/2012, 11:38 AM  LOS: 3 days

## 2012-06-10 NOTE — Significant Event (Signed)
Rapid Response Event Note  Overview: Time Called: 0730 Event Type: Respiratory  Initial Focused Assessment: See flow sheet. Alert. Br sounds diminished left; right with good air movement some crackles. Lasicx 40 already given. NRB mask in place with sats 99   Interventions: Assisted with assessment, weaning O2 to Icard at 6 LPM. Some output after lasix.   Event Summary:   at      at 0730  Outcome: Stayed in room and stabalized  Event End Time: 0820  Kristine Linea

## 2012-06-11 DIAGNOSIS — I83229 Varicose veins of left lower extremity with both ulcer of unspecified site and inflammation: Secondary | ICD-10-CM | POA: Diagnosis not present

## 2012-06-11 DIAGNOSIS — E119 Type 2 diabetes mellitus without complications: Secondary | ICD-10-CM | POA: Diagnosis not present

## 2012-06-11 DIAGNOSIS — Z95810 Presence of automatic (implantable) cardiac defibrillator: Secondary | ICD-10-CM | POA: Diagnosis not present

## 2012-06-11 DIAGNOSIS — I2510 Atherosclerotic heart disease of native coronary artery without angina pectoris: Secondary | ICD-10-CM | POA: Diagnosis not present

## 2012-06-11 DIAGNOSIS — D62 Acute posthemorrhagic anemia: Secondary | ICD-10-CM | POA: Diagnosis not present

## 2012-06-11 DIAGNOSIS — L97409 Non-pressure chronic ulcer of unspecified heel and midfoot with unspecified severity: Secondary | ICD-10-CM | POA: Diagnosis not present

## 2012-06-11 DIAGNOSIS — M79609 Pain in unspecified limb: Secondary | ICD-10-CM | POA: Diagnosis not present

## 2012-06-11 DIAGNOSIS — E785 Hyperlipidemia, unspecified: Secondary | ICD-10-CM | POA: Diagnosis not present

## 2012-06-11 DIAGNOSIS — E1149 Type 2 diabetes mellitus with other diabetic neurological complication: Secondary | ICD-10-CM | POA: Diagnosis not present

## 2012-06-11 DIAGNOSIS — I5022 Chronic systolic (congestive) heart failure: Secondary | ICD-10-CM | POA: Diagnosis not present

## 2012-06-11 DIAGNOSIS — L97509 Non-pressure chronic ulcer of other part of unspecified foot with unspecified severity: Secondary | ICD-10-CM | POA: Diagnosis not present

## 2012-06-11 DIAGNOSIS — E1059 Type 1 diabetes mellitus with other circulatory complications: Secondary | ICD-10-CM | POA: Diagnosis not present

## 2012-06-11 DIAGNOSIS — G4700 Insomnia, unspecified: Secondary | ICD-10-CM | POA: Diagnosis not present

## 2012-06-11 DIAGNOSIS — E1169 Type 2 diabetes mellitus with other specified complication: Secondary | ICD-10-CM | POA: Diagnosis not present

## 2012-06-11 DIAGNOSIS — I369 Nonrheumatic tricuspid valve disorder, unspecified: Secondary | ICD-10-CM | POA: Diagnosis not present

## 2012-06-11 DIAGNOSIS — R279 Unspecified lack of coordination: Secondary | ICD-10-CM | POA: Diagnosis not present

## 2012-06-11 DIAGNOSIS — R918 Other nonspecific abnormal finding of lung field: Secondary | ICD-10-CM | POA: Diagnosis not present

## 2012-06-11 DIAGNOSIS — N179 Acute kidney failure, unspecified: Secondary | ICD-10-CM | POA: Diagnosis not present

## 2012-06-11 DIAGNOSIS — M6281 Muscle weakness (generalized): Secondary | ICD-10-CM | POA: Diagnosis not present

## 2012-06-11 DIAGNOSIS — F411 Generalized anxiety disorder: Secondary | ICD-10-CM | POA: Diagnosis not present

## 2012-06-11 DIAGNOSIS — Z4889 Encounter for other specified surgical aftercare: Secondary | ICD-10-CM | POA: Diagnosis not present

## 2012-06-11 DIAGNOSIS — D509 Iron deficiency anemia, unspecified: Secondary | ICD-10-CM | POA: Diagnosis not present

## 2012-06-11 DIAGNOSIS — K269 Duodenal ulcer, unspecified as acute or chronic, without hemorrhage or perforation: Secondary | ICD-10-CM | POA: Diagnosis not present

## 2012-06-11 DIAGNOSIS — I739 Peripheral vascular disease, unspecified: Secondary | ICD-10-CM | POA: Diagnosis not present

## 2012-06-11 DIAGNOSIS — I10 Essential (primary) hypertension: Secondary | ICD-10-CM | POA: Diagnosis not present

## 2012-06-11 DIAGNOSIS — R262 Difficulty in walking, not elsewhere classified: Secondary | ICD-10-CM | POA: Diagnosis not present

## 2012-06-11 DIAGNOSIS — S98139A Complete traumatic amputation of one unspecified lesser toe, initial encounter: Secondary | ICD-10-CM | POA: Diagnosis not present

## 2012-06-11 DIAGNOSIS — I509 Heart failure, unspecified: Secondary | ICD-10-CM | POA: Diagnosis not present

## 2012-06-11 DIAGNOSIS — E118 Type 2 diabetes mellitus with unspecified complications: Secondary | ICD-10-CM | POA: Diagnosis not present

## 2012-06-11 DIAGNOSIS — L03119 Cellulitis of unspecified part of limb: Secondary | ICD-10-CM | POA: Diagnosis not present

## 2012-06-11 LAB — CBC
MCH: 26.7 pg (ref 26.0–34.0)
MCV: 79.2 fL (ref 78.0–100.0)
Platelets: 322 10*3/uL (ref 150–400)
RDW: 15.2 % (ref 11.5–15.5)
WBC: 14.2 10*3/uL — ABNORMAL HIGH (ref 4.0–10.5)

## 2012-06-11 LAB — TYPE AND SCREEN

## 2012-06-11 LAB — BASIC METABOLIC PANEL
CO2: 26 mEq/L (ref 19–32)
Calcium: 8.5 mg/dL (ref 8.4–10.5)
Creatinine, Ser: 1 mg/dL (ref 0.50–1.10)
Glucose, Bld: 131 mg/dL — ABNORMAL HIGH (ref 70–99)

## 2012-06-11 LAB — GLUCOSE, CAPILLARY: Glucose-Capillary: 241 mg/dL — ABNORMAL HIGH (ref 70–99)

## 2012-06-11 MED ORDER — ALPRAZOLAM 0.25 MG PO TABS: 0.2500 mg | ORAL_TABLET | Freq: Every evening | ORAL | Status: DC | PRN

## 2012-06-11 MED ORDER — HYDROCODONE-ACETAMINOPHEN 5-325 MG PO TABS: 1.0000 | ORAL_TABLET | Freq: Four times a day (QID) | ORAL | Status: DC | PRN

## 2012-06-11 MED ORDER — INSULIN ASPART 100 UNIT/ML ~~LOC~~ SOLN: 0.0000 [IU] | Freq: Three times a day (TID) | SUBCUTANEOUS | Status: DC

## 2012-06-11 MED ORDER — FUROSEMIDE 10 MG/ML IJ SOLN
40.0000 mg | Freq: Once | INTRAMUSCULAR | Status: AC
  Administered 2012-06-11: 40 mg via INTRAVENOUS

## 2012-06-11 NOTE — Discharge Summary (Signed)
Addendum  Patient seen and examined, chart and data base reviewed.  I agree with the above assessment and discharge plan.  For full details please see Mrs. Algis Downs PA note.  Diabetic foot ulcer, status post amputation of the left great toe.  Diabetes mellitus type 2 0 medication.  Mild fluid overload, Lasix was given, restarted back on her home dose of Lasix.   Clint Lipps, MD [facility] Pager: [phone] 06/11/2012, 1:25 PM

## 2012-06-11 NOTE — Progress Notes (Addendum)
NURSING PROGRESS NOTE  [name] [name] [id number] Discharge Data: 06/11/2012 3:18 PM Attending Provider: Clydia Llano, MD YQM:VHQI Drue Novel, MD     [facility] to be D/C'd Nursing Home per MD order.  Sent with the patient the After Visit Summary. All IV's discontinued with no bleeding noted. All belongings returned to patient for patient to take home.   Last Vital Signs:  Blood pressure 136/73, pulse 63, temperature 98.2 F (36.8 C), temperature source Oral, resp. rate 20, height 5\' 8"  (1.727 m), weight 91.627 kg (202 lb), SpO2 95.00%.  Discharge Medication List   Medication List    STOP taking these medications       metFORMIN 500 MG tablet  Commonly known as:  GLUCOPHAGE     ramipril 5 MG capsule  Commonly known as:  ALTACE      TAKE these medications       ALPRAZolam 0.25 MG tablet  Commonly known as:  XANAX  Take 1-2 tablets (0.25-0.5 mg total) by mouth at bedtime as needed for sleep.     aspirin 325 MG tablet  Take 325 mg by mouth daily.     atorvastatin 40 MG tablet  Commonly known as:  LIPITOR  Take 1 tablet (40 mg total) by mouth daily.     clopidogrel 75 MG tablet  Commonly known as:  PLAVIX  Take 75 mg by mouth daily.     furosemide 40 MG tablet  Commonly known as:  LASIX  Take 60 mg by mouth daily.     glimepiride 4 MG tablet  Commonly known as:  AMARYL  Take 4 mg by mouth daily before breakfast.     HYDROcodone-acetaminophen 5-325 MG per tablet  Commonly known as:  NORCO/VICODIN  Take 1 tablet by mouth every 6 (six) hours as needed.     insulin aspart 100 UNIT/ML injection  Commonly known as:  novoLOG  Inject 0-9 Units into the skin 3 (three) times daily with meals. CBG 70 - 120: 0 units  CBG 121 - 150: 0 units  CBG 151 - 200: 0 units  CBG 201 - 250: 2 units  CBG 251 - 300: 3 units  CBG 301 - 350: 4 units  CBG 351 - 400: 5 units     isosorbide dinitrate 30 MG tablet  Commonly known as:  ISORDIL  Take 0.5 tablets (15 mg total) by mouth 2  (two) times daily.     metoprolol 50 MG tablet  Commonly known as:  LOPRESSOR  Take 50 mg by mouth 2 (two) times daily.     ONE TOUCH ULTRA TEST test strip  Generic drug:  glucose blood  1 each by Other route as needed. Use as instructed     potassium chloride 10 MEQ tablet  Commonly known as:  K-DUR  Take 10 mEq by mouth daily.        Madelin Rear RN, MSN, CMSRN Report called to [facility] at [employer]

## 2012-06-11 NOTE — Discharge Summary (Signed)
Physician Discharge Summary  [name] [name] UJW:[id number] DOB: 19-Jun-1932 DOA: 06/07/2012  PCP: [name], MD  Admit date: 06/07/2012 Discharge date: 06/11/2012  Time spent: 50  minutes  Recommendations for Outpatient Follow-up:  1. LLE bandage to be left in place until she follows up with Dr. Lajoyce Corners.  Minimize weight bearing until that time as well. 2. Patient with hypoxia at rest following fluid overload.  Has been treated with lasix but still requiring 1-2 L of oxygen n/c to maintain oxygen saturations of 92%.  Dropped to 85% on room air. 3. 2D echo result pending at the time of discharge.  Should follow up with Dr. Riley Kill (cardiology)  for results and treatment. 4. Question of area of abnormal irregular soft tissue density measuring 5.0 x 4.1 x 3.8 cm in the posterior medial aspect of the left lower lung lobe.  Consistent with rounded atelectasis.  May need follow up PET Scan. 5.   Metformin discontinued in favor of sliding scale insulin (sensitive) 6.   Ramipril discontinued (patient's BP is wnl on metoprolol and isosorbide) 7.  Check CBC in 1 week to ensure hgb is climbing. Patient on aspirin and plavix 8.  BMET in 1 week - patient on lasix and potassium.  Discharge Diagnoses:  Principal Problem:   Diabetic foot ulcer- LEFT Active Problems:   DIABETES MELLITUS, TYPE II   HYPERLIPIDEMIA   ANEMIA, IRON DEFICIENCY   HYPERTENSION   Chronic systolic heart failure   Abnormal CXR (chest x-ray)   AKI (acute kidney injury)   Discharge Condition: stable on oxygen 1-2 liters continuous.  S/P amputation of toe.  Received 2 units of PRBCs.   Diet recommendation: carb modified, low salt  Filed Weights   06/07/12 1755 06/11/12 0500  Weight: 87.816 kg (193 lb 9.6 oz) 91.627 kg (202 lb)    History of present illness at the time of admission:   Patient is a 79 year old female with multiple medical problems including diabetes, hypertension, hyperlipidemia, CAD, CHF was sent from Dr.  Rosalyn Charters office after the preop cardiac evaluation. Patient has large left foot ulcer and she has developed presumed osteomyelitis. The patient was admitted in 2/14 for the same and CT scan of the foot did not show any evidence of osteoarthritis or abscess formation at that time. Patient was treated with Elita Quick and vancomycin while she was in the hospital and then discharged on Bactrim and ciprofloxacin for 10 more days. Patient states that her foot ulcer did not improve much. She saw Dr. Lajoyce Corners 2 weeks ago in the office and was recommended debridement/amputation.  Patient was seen by Dr. Riley Kill today for cardiac evaluation and was cleared from cardiac standpoint. However it was noted that patient's creatinine function was worsened to 1.49 and patient was sent to the hospital for medical admission.Marland Kitchen   Hospital Course:   Diabetic foot ulcer- LEFT  -Status post left great toe amputation done by Dr. Lajoyce Corners 4/18 - Minimize weightbearing left foot. Keep dressing clean dry and intact. Keep foot elevated level with the heart. -D/c to skilled nursing facility.   -Follow up with Dr. Lajoyce Corners in 2 weeks.  SOB with hypoxia at rest. -Likely secondary to mild fluid overload from blood and iron transfusion. BNP 9000+ -Patient responded very well to IV Lasix breathing improved on discharge.  -2D echo completed but results are pending at discharge. -Will d/c on 1-2 liters of oxygen to maintain sats of 92%  Mild acute kidney injury: Creatinine 1.49, baseline 1.0  -Likely worsened due  to ongoing issues, per patient her metformin dose was recently increased to 3 weeks ago  -now back to baseline 1.0 creatinine. -Metformin discontinued in favor of sliding scale insulin (sensitive) -Ramipril discontinued (patient's BP is wnl on metoprolol and isosorbide)  DIABETES MELLITUS, TYPE II:  -Place on sliding scale insulin, resume amaryl, carb modified diet   HYPERLIPIDEMIA continue Lipitor   ANEMIA, IRON DEFICIENCY   -Hemoglobin dropped to 6.9, she received a transfusion of 2 units of packed RBCs. Hemoglobin now is 8.7.  -Status post infusion of 500 mg of IV iron.  -Recommend follow up CBC in 1 week.  (patient on aspirin and plavix)  HYPERTENSION  -Currently stable continue metoprolol and isosorbide.  Ramipril discontinued.  Abnormal CXR (chest x-ray)  - I discussed in detail with the patient and her daughter in the room.   CT of the chest obtained for further workup. Will not be able to do CT of the chest with contrast due to acute renal insufficiency.  CT Chest reported abnormality is consistent with "rounded atelectasis".  PET scan recommended for further evaluation.   Procedures:  Left great toe amputation  2D echocardiogram  Consultations:  Dr. Lajoyce Corners of orthopedic surgery  Discharge Exam: Filed Vitals:   06/10/12 1408 06/10/12 2100 06/11/12 0500 06/11/12 1021  BP: 91/52 117/68 150/77 116/62  Pulse: 67 75 73 69  Temp: 97.9 F (36.6 C) 98.6 F (37 C) 98.4 F (36.9 C)   TempSrc: Oral Oral Oral   Resp: 20 20 20    Height:      Weight:   91.627 kg (202 lb)   SpO2: 97% 97% 96%     General: Alert and awake, oriented x3, not in any acute distress. On 2L oxygen via nasal canula HEENT: anicteric sclera, pupils reactive to light and accommodation, EOMI  CVS: S1-S2 clear, no murmur rubs or gallops  Chest: clear to auscultation bilaterally, no wheezing, rales or rhonchi  Abdomen: soft nontender, nondistended, normal bowel sounds, no organomegaly  Extremities: no cyanosis, clubbing or edema noted bilaterally.  Left foot bandaged.  Toes warm with good cap refill. Neuro: Cranial nerves II-XII intact, no focal neurological deficits   Discharge Instructions      Discharge Orders   Future Appointments Provider Department Dept Phone   09/21/2012 10:00 AM Marinus Maw, MD [facility] Main Office [place]) [phone]   Future Orders Complete By Expires     Diet - low sodium heart  healthy  As directed     Diet - low sodium heart healthy  As directed     Diet Carb Modified  As directed     Increase activity slowly  As directed     Increase activity slowly  As directed         Medication List    STOP taking these medications       metFORMIN 500 MG tablet  Commonly known as:  GLUCOPHAGE     ramipril 5 MG capsule  Commonly known as:  ALTACE      TAKE these medications       ALPRAZolam 0.25 MG tablet  Commonly known as:  XANAX  Take 1-2 tablets (0.25-0.5 mg total) by mouth at bedtime as needed for sleep.     aspirin 325 MG tablet  Take 325 mg by mouth daily.     atorvastatin 40 MG tablet  Commonly known as:  LIPITOR  Take 1 tablet (40 mg total) by mouth daily.     clopidogrel 75 MG  tablet  Commonly known as:  PLAVIX  Take 75 mg by mouth daily.     furosemide 40 MG tablet  Commonly known as:  LASIX  Take 60 mg by mouth daily.     glimepiride 4 MG tablet  Commonly known as:  AMARYL  Take 4 mg by mouth daily before breakfast.     HYDROcodone-acetaminophen 5-325 MG per tablet  Commonly known as:  NORCO/VICODIN  Take 1 tablet by mouth every 6 (six) hours as needed.     insulin aspart 100 UNIT/ML injection  Commonly known as:  novoLOG  Inject 0-9 Units into the skin 3 (three) times daily with meals. CBG 70 - 120: 0 units  CBG 121 - 150: 0 units  CBG 151 - 200: 0 units  CBG 201 - 250: 2 units  CBG 251 - 300: 3 units  CBG 301 - 350: 4 units  CBG 351 - 400: 5 units     isosorbide dinitrate 30 MG tablet  Commonly known as:  ISORDIL  Take 0.5 tablets (15 mg total) by mouth 2 (two) times daily.     metoprolol 50 MG tablet  Commonly known as:  LOPRESSOR  Take 50 mg by mouth 2 (two) times daily.     ONE TOUCH ULTRA TEST test strip  Generic drug:  glucose blood  1 each by Other route as needed. Use as instructed     potassium chloride 10 MEQ tablet  Commonly known as:  K-DUR  Take 10 mEq by mouth daily.       Follow-up Information    Follow up with DUDA,MARCUS V, MD In 2 weeks.   Contact information:   [address] [place] [address] [phone]       Follow up with [name], MD. Schedule an appointment as soon as possible for a visit in 1 month.   Contact information:   [address] [facility] [address] [place] [address] [phone]       Follow up with [name], MD. Schedule an appointment as soon as possible for a visit in 1 week.   Contact information:   [address] [place] [address] [phone]        The results of significant diagnostics from this hospitalization (including imaging, microbiology, ancillary and laboratory) are listed below for reference.    Significant Diagnostic Studies: Dg Chest 2 View  06/06/2012  *RADIOLOGY REPORT*  Clinical Data: Preoperative exam, history of hypertension and diabetes, CABG.  CHEST - 2 VIEW  Comparison: 06/08/2009  Findings: Heart size is mildly enlarged without evidence for edema. Right upper lobe granuloma is stable projecting between the right posterior fifth and sixth ribs.  Left-sided AICD in place. There is a new irregular opacity projecting in the retrocardiac space, seen best on the lateral view, with possible associated pleural thickening.  No pleural effusion.  No acute osseous finding. Kyphosis with mild loss of central vertebral body height at multiple levels of the mid thoracic spine noted.  IMPRESSION: Left lower lobe irregular opacity, which primary diagnostic considerations include pneumonia versus neoplasm.  Chest CT with IV contrast is recommended for further evaluation.  These results will be called to the ordering clinician or representative by the Radiologist Assistant, and communication documented in the PACS Dashboard.   Original Report Authenticated By: Christiana Pellant, [name]    Ct Chest Wo Contrast  06/07/2012  *RADIOLOGY REPORT*  Clinical Data: Abnormal preoperative chest  x-ray.  Ill-defined density at the left lung base.  CT CHEST WITHOUT CONTRAST  Technique:  Multidetector CT imaging of the chest was performed following the standard protocol without IV contrast.  Comparison: Chest x-ray dated 04/16 1014 and chest CT dated 06/08/2009  Findings: There is an area of abnormal irregular soft tissue density measuring 5.0 x 4.1 x 3.8 cm in the posterior medial aspect of the left lower lobe.  The appearance is typical for  "rounded atelectasis" with cicatrization in the area of previous inflammation and scarring.  There is marked retraction of the posterior superior aspect of the left major fissure.  A granuloma seen on the pleura posteriorly on the prior study was 5.7 cm above the area of inflammation and scarring but is now within the area of scarring.  The left upper lobe is normal.  The right lung is clear.  There is no hilar or mediastinal adenopathy.  Heart size is normal.  No acute osseous abnormality.  The visualized portion of the upper abdomen is normal except for previously noted cholelithiasis.  IMPRESSION:   The soft tissue mass-like density at the left lung bases felt to represent an area of "rounded atelectasis", an area of cicatrization an area of prior inflammation.  This is unlikely to represent malignancy.  PET scan could be utilized for further evaluation but the appearance is classic for "rounded atelectasis".   Original Report Authenticated By: Francene Boyers, [name]    Dg Chest Port 1v Same Day  06/10/2012  *RADIOLOGY REPORT*  Clinical Data:  Short of breath  PORTABLE CHEST - 1 VIEW SAME DAY  Comparison: Prior chest x-ray 06/06/2012; prior CT chest 06/07/2012  Findings: Stable position of left subclavian approach cardiac rhythm maintenance device.  Lead projects over the right ventricle. Status post mediastinotomy with evidence of prior multivessel CABG. Aortic atherosclerosis again noted.  No additional support apparatus.  Increased pulmonary vascular congestion now  with mild interstitial edema.  Increasing bibasilar opacities may reflect a combination of pleural fluid and rounded atelectasis on the left. No pneumothorax.  IMPRESSION:  1.  Increasing pulmonary vascular congestion now with mild interstitial edema. 2.  Worsening left greater than right basilar opacities likely reflect a combination of edema, atelectasis and perhaps small developing pleural effusions. Additionally, there is rounded atelectasis in the on the left as seen on prior CT.   Original Report Authenticated By: Malachy Moan, [name]     Microbiology: Recent Results (from the past 240 hour(s))  SURGICAL PCR SCREEN     Status: None   Collection Time    06/06/12 12:34 PM      Result Value Range Status   MRSA, PCR NEGATIVE  NEGATIVE Final   Staphylococcus aureus NEGATIVE  NEGATIVE Final   Comment:            The Xpert SA Assay (FDA     approved for NASAL specimens     in patients over 77 years of age),     is one component of     a comprehensive surveillance     program.  Test performance has     been validated by [employer] for patients greater     than or equal to 18 year old.     It is not intended     to diagnose infection nor to     guide or monitor treatment.  URINE CULTURE     Status: None   Collection Time  06/08/12  9:04 AM      Result Value Range Status   Specimen Description URINE, CATHETERIZED   Final   Special Requests NONE   Final   Culture  Setup Time 06/08/2012 09:55   Final   Colony Count NO GROWTH   Final   Culture NO GROWTH   Final   Report Status 06/09/2012 FINAL   Final     Labs: Basic Metabolic Panel:  Recent Labs Lab 06/07/12 2003 06/08/12 0634 06/09/12 0625 06/10/12 0625 06/11/12 0550  NA 131* 133* 135 132* 135  K 4.6 4.6 4.5 3.9 4.0  CL 95* 101 102 100 101  CO2 25 25 26 24 26   GLUCOSE 159* 87 107* 155* 131*  BUN 23 23 17 13 14   CREATININE 1.38* 1.44* 1.22* 0.98 1.00  CALCIUM 9.4 8.8 8.6 8.6 8.5   Liver Function  Tests:  Recent Labs Lab 06/06/12 1235  AST 19  ALT 16  ALKPHOS 164*  BILITOT 0.1*  PROT 7.4  ALBUMIN 3.1*   CBC:  Recent Labs Lab 06/07/12 2003 06/08/12 0634 06/09/12 0625 06/10/12 0625 06/11/12 0550  WBC 12.2* 10.1 11.0* 14.6* 14.2*  HGB 8.1* 7.0* 6.9* 8.7* 8.6*  HCT 24.3* 21.4* 20.8* 26.0* 25.5*  MCV 75.9* 76.4* 76.8* 77.6* 79.2  PLT 330 316 310 316 322   BNP: BNP (last 3 results)  Recent Labs  06/10/12 1237  PROBNP 9026.0*   CBG:  Recent Labs Lab 06/10/12 0750 06/10/12 1133 06/10/12 1607 06/10/12 2159 06/11/12 0740  GLUCAP 171* 181* 205* 173* 126*     Signed:  Stephani Police, PA-C [facility] 06/11/2012, 11:42 AM

## 2012-06-11 NOTE — Telephone Encounter (Signed)
Not on pt's current med list. OK to refill? 

## 2012-06-11 NOTE — Progress Notes (Signed)
  Echocardiogram 2D Echocardiogram has been performed.  [name] [name] 06/11/2012, 9:33 AM

## 2012-06-11 NOTE — Progress Notes (Signed)
Physical Therapy Treatment Patient Details Name: [name] [name] MRN: [id number] DOB: [birth year] Today's Date: 06/11/2012 Time: 1324-4010 PT Time Calculation (min): 24 min  PT Assessment / Plan / Recommendation Comments on Treatment Session  pt continues with generalzied deconditioning, NWB status and need for use of stedy device.  She will need continued PT at SNF    Follow Up Recommendations  SNF     Does the patient have the potential to tolerate intense rehabilitation     Barriers to Discharge        Equipment Recommendations  Wheelchair (measurements PT);Wheelchair cushion (measurements PT)    Recommendations for Other Services OT consult  Frequency Min 3X/week   Plan Discharge plan remains appropriate;Frequency remains appropriate    Precautions / Restrictions Restrictions Weight Bearing Restrictions: Yes LLE Weight Bearing: Non weight bearing Other Position/Activity Restrictions: Minimizing weight bearing on lt foot and use post-op shoe.   Pertinent Vitals/Pain No c/o pain    Mobility  Bed Mobility Bed Mobility: Supine to Sit;Sitting - Scoot to Edge of Bed Supine to Sit: 4: Min assist Sitting - Scoot to Edge of Bed: 4: Min assist Details for Bed Mobility Assistance: verbal cues for technique.  Assist to bring trunk up and hips to EOB. Transfers Transfers: Sit to Stand;Stand to Sit Sit to Stand: With upper extremity assist;3: Mod assist;From elevated surface;From bed Stand to Sit: 3: Mod assist;With upper extremity assist;To chair/3-in-1 Transfer via Lift Equipment: Stedy Details for Transfer Assistance: Needed assist to initiate lifting hips, then she was able to stand the rest of the way up    Exercises General Exercises - Lower Extremity Gluteal Sets: AROM;Both;5 reps Long Arc Quad: AROM;Both;10 reps Hip Flexion/Marching: AROM;Both;5 reps Other Exercises Other Exercises: repeated sit to stand x3 from high seat of stedy   PT Diagnosis:    PT Problem List:    PT Treatment Interventions:     PT Goals Acute Rehab PT Goals PT Goal: Supine/Side to Sit - Progress: Progressing toward goal PT Goal: Sit to Stand - Progress: Progressing toward goal PT Goal: Stand to Sit - Progress: Progressing toward goal PT Transfer Goal: Bed to Chair/Chair to Bed - Progress: Progressing toward goal  Visit Information  Last PT Received On: 06/11/12 Assistance Needed: +1    Subjective Data  Subjective: I'm supposed to be discharged today Patient Stated Goal: to walk without a walker again   Cognition  Cognition Arousal/Alertness: Awake/alert Behavior During Therapy: WFL for tasks assessed/performed Overall Cognitive Status: Within Functional Limits for tasks assessed    Balance  Balance Balance Assessed: Yes Static Sitting Balance Static Sitting - Balance Support: No upper extremity supported Static Sitting - Level of Assistance: 5: Stand by assistance Static Standing Balance Static Standing - Balance Support: Bilateral upper extremity supported Static Standing - Level of Assistance: 5: Stand by assistance Static Standing - Comment/# of Minutes: using stedy for support, able to stand NWB Left Leg  End of Session PT - End of Session Activity Tolerance: Patient limited by fatigue Patient left: in chair Nurse Communication: Mobility status;Need for lift equipment   GP    Bayard Hugger. [place], [place] [phone] 06/11/2012, 12:22 PM

## 2012-06-11 NOTE — Telephone Encounter (Signed)
No RF, she is currently at the hospital, will se what meds she is d/c on

## 2012-06-12 ENCOUNTER — Non-Acute Institutional Stay (SKILLED_NURSING_FACILITY): Payer: Medicare Other | Admitting: Internal Medicine

## 2012-06-12 ENCOUNTER — Encounter (HOSPITAL_COMMUNITY): Payer: Self-pay | Admitting: Orthopedic Surgery

## 2012-06-12 DIAGNOSIS — I10 Essential (primary) hypertension: Secondary | ICD-10-CM | POA: Diagnosis not present

## 2012-06-12 DIAGNOSIS — E1059 Type 1 diabetes mellitus with other circulatory complications: Secondary | ICD-10-CM | POA: Diagnosis not present

## 2012-06-12 DIAGNOSIS — I739 Peripheral vascular disease, unspecified: Secondary | ICD-10-CM

## 2012-06-12 DIAGNOSIS — L98499 Non-pressure chronic ulcer of skin of other sites with unspecified severity: Secondary | ICD-10-CM | POA: Diagnosis not present

## 2012-06-12 DIAGNOSIS — D509 Iron deficiency anemia, unspecified: Secondary | ICD-10-CM

## 2012-06-19 ENCOUNTER — Non-Acute Institutional Stay (SKILLED_NURSING_FACILITY): Payer: Medicare Other | Admitting: Internal Medicine

## 2012-06-19 DIAGNOSIS — D62 Acute posthemorrhagic anemia: Secondary | ICD-10-CM

## 2012-06-20 ENCOUNTER — Other Ambulatory Visit: Payer: Self-pay | Admitting: *Deleted

## 2012-06-20 MED ORDER — ATORVASTATIN CALCIUM 40 MG PO TABS: 40.0000 mg | ORAL_TABLET | Freq: Every day | ORAL | Status: DC

## 2012-06-20 MED ORDER — METOPROLOL TARTRATE 50 MG PO TABS: 50.0000 mg | ORAL_TABLET | Freq: Two times a day (BID) | ORAL | Status: DC

## 2012-06-21 ENCOUNTER — Encounter: Payer: Self-pay | Admitting: Internal Medicine

## 2012-06-25 NOTE — Progress Notes (Signed)
Patient ID: [name] [name], female   DOB: Nov 27, 1932, 79 y.o.   MRN: [id number]        HISTORY & PHYSICAL  DATE:  06/12/2012  FACILITY: [facility]   LEVEL OF CARE: SNF   ALLERGIES:  No Known Allergies  CHIEF COMPLAINT:  Manage left foot diabetic ulcer, iron deficiency anemia and diabetes mellitus.    HISTORY OF PRESENT ILLNESS:  This is a 79 year-old, Caucasian female who was having a large left foot ulcer.    LEFT FOOT DIABETIC ULCER:  The foot did not respond to IV antibiotics or PO antibiotics.    Orthopedic surgeon recommended debridement and amputation.  Therefore, she was hospitalized and underwent left great toe amputation and then admitted to this facility for short-term rehabilitation.  She denies left lower extremity pain.    ANEMIA: Patient's hemoglobin dropped to 6.9 post surgically.  Therefore, she was transfused 2 U of packed red blood cells, after which hemoglobin came to 8.7.   She also received IV iron infusion.  The anemia has been stable. The patient denies fatigue, melena or hematochezia. She is  currently not on iron.    DM:pt's DM remains stable.  Pt denies polyuria, polydipsia, polyphagia, changes in vision or hypoglycemic episodes.  No complications noted from the medication presently being used.  Last hemoglobin A1c is:   A recent  hemoglobin A1C is not available.   PAST MEDICAL HISTORY :  Past Medical History  Diagnosis Date  . Diabetes mellitus   . Hyperlipidemia   . Hypertension   . CAD (coronary artery disease)     s/p stent R side 9/08, re stenosis 1/09, s/p angioplasty 1/09 and a stent on 12/09; re-angioplasty 5/11  . CHF (congestive heart failure)   . Diverticulitis 12/08  . 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 Hx of CABG 08/08/00    LIMA to LAD, SVG to intermediate, SVG to OM2, SVG to distal RCA  . Barrett esophagus     EGD 08/2009, next 2013  . Carotid arterial disease     s/p stent R side 9-08, re stenosis 1-09, s/p angioplasty 1-09 and a stent on  12-09, last  angiogram 06-24-2009  . Abnormal CT scan, chest     last 06-08-2009, see report   . Cardiac device in situ, other      12-08-- [employer]   . Myocardial infarction   . ICD (implantable cardiac defibrillator) in place   . Stroke   . Collagen vascular disease   . Pneumonia     vural 30 yrs ago  . GERD (gastroesophageal reflux disease)     occ  . Anemia     hx    PAST SURGICAL HISTORY: Past Surgical History  Procedure Laterality Date  . Arterial bypass surgry  1999  . Coronary artery bypass graft    . Cardiac defibrillator placement      st judes  . Amputation Left 06/08/2012    Procedure: AMPUTATION RAY;  Surgeon: Nadara Mustard, MD;  Location: [facility] OR;  Service: Orthopedics;  Laterality: Left;  Left Foot 1st Ray Amputation    SOCIAL HISTORY:  reports that she has never smoked. She has never used smokeless tobacco. She reports that she does not drink alcohol or use illicit drugs.  FAMILY HISTORY:  Family History  Problem Relation Age of Onset  . Breast cancer Mother 26  . Colon cancer Neg Hx   . Tuberculosis Father     CURRENT MEDICATIONS:  Reviewed per [facility]  REVIEW OF SYSTEMS:   GI:  Complains of constipation.   GU:  Complains of urinary hesitancy.    See HPI otherwise 14 point ROS is negative.  PHYSICAL EXAMINATION  VS:  T 98.4       P 69      RR  20     BP  116/62     POX% 96      WT (Lb) 202  GENERAL: no acute distress, moderately obese body habitus SKIN: left lower extremity is wrapped, warm & dry, no suspicious lesions or rashes, no excessive dryness EYES: conjunctivae normal, sclerae normal, normal eye lids MOUTH/THROAT: lips without lesions,no lesions in the mouth,tongue is without lesions,uvula elevates in midline NECK: supple, trachea midline, no neck masses, no thyroid tenderness, no thyromegaly LYMPHATICS: no LAN in the neck, no supraclavicular LAN RESPIRATORY: breathing is even & unlabored, BS CTAB CARDIAC: RRR, no murmur,no extra  heart sounds EDEMA/VARICOSITIES:  +2 bilateral edema, left lower extremity is wrapped ARTERIAL:  pedal pulses +1 bilaterally GI:  ABDOMEN: abdomen soft, normal BS, no masses, no tenderness  LIVER/SPLEEN: no hepatomegaly, no splenomegaly MUSCULOSKELETAL: HEAD: normal to inspection & palpation BACK: no kyphosis, scoliosis or spinal processes tenderness EXTREMITIES: LEFT UPPER EXTREMITY: full range of motion, normal strength & tone RIGHT UPPER EXTREMITY:  full range of motion, normal strength & tone LEFT LOWER EXTREMITY: strength not tested, range of motion moderate RIGHT LOWER EXTREMITY: strength decreased, range of motion moderate PSYCHIATRIC: the patient is alert & oriented to person, affect & behavior appropriate  LABS/RADIOLOGY: Chest x-ray showed left lower lobe irregular opacity.    Chest CT showed a soft tissue density at left base, but present at area of rounded atelectasis.    MRSA by PCR negative.    Staph aureus by PCR negative.   Urine culture showed no growth.     Glucose 131, otherwise BMP normal.    Alkaline phosphatase 164, albumin 3.1, otherwise liver profile normal.    WBC 14.2, hemoglobin 8.6, MCV 79.2, platelets 322.  ASSESSMENT/PLAN:  Left foot diabetic ulcer.  Status post left great toe amputation.  Continue rehabilitation.  Iron deficiency anemia.  Status post transfusion and IV infusion.  Reassess hemoglobin level and iron studies.    Diabetes mellitus with vascular complications.  Continue current medications.   Hypertension.  Well controlled.  Acute renal failure.  Reassess renal functions.    CHF.  Adequately compensated.   Constipation.  New problem.  Start colace 100 mg b.i.d.   Urinary hesitancy.  New problem.  Check urine culture and sensitivities.    I have reviewed patient's medical records received at admission/from hospitalization.  CPT CODE: 16109

## 2012-07-03 NOTE — Progress Notes (Signed)
Patient ID: [name] [name], female   DOB: 07/02/1932, 79 y.o.   MRN: [id number]        PROGRESS NOTE  DATE: 06/19/2012  FACILITY:  [facility]  LEVEL OF CARE: SNF (31)  Acute Visit  CHIEF COMPLAINT:  Manage acute blood loss anemia.    HISTORY OF PRESENT ILLNESS: I was requested by the staff to assess the patient regarding above problem(s):  ANEMIA: A few days ago, the patient  apparently had rectal bleeding.  Therefore, hemoglobin level was checked.  On 06/18/2012, hemoglobin was 8.8, MCV 78.5.  From hospital discharge, hemoglobin was 8.6.  On 06/13/2012, iron panel was normal, ferritin level 1133.  The patient's aspirin and Plavix were held.  The patient denies further rectal bleeding.  The anemia has been stable. The patient denies fatigue, melena or hematochezia.   No complications from the medications currently being used.       PAST MEDICAL HISTORY : Reviewed.  No changes.  CURRENT MEDICATIONS: Reviewed per [facility]  REVIEW OF SYSTEMS:  GENERAL: no change in appetite, no fatigue, no weight changes, no fever, chills or weakness RESPIRATORY: no cough, SOB, DOE,, wheezing, hemoptysis CARDIAC: no chest pain, edema or palpitations GI: no abdominal pain, diarrhea, constipation, heart burn, nausea or vomiting  PHYSICAL EXAMINATION  GENERAL: no acute distress, moderately obese body habitus CARDIAC: RRR, no murmur,no extra heart sounds EDEMA/VARICOSITIES:  +3 bilateral lower extremity edema  ARTERIAL:  pedal pulses nonpalpable  GI: abdomen soft, normal BS, no masses, no tenderness, no hepatomegaly, no splenomegaly  ASSESSMENT/PLAN:  Acute blood loss anemia.  Hemoglobin has improved.  No further rectal bleeding.  Therefore, restart aspirin and Plavix.  CPT CODE: 40981

## 2012-07-09 DIAGNOSIS — E1149 Type 2 diabetes mellitus with other diabetic neurological complication: Secondary | ICD-10-CM | POA: Diagnosis not present

## 2012-07-09 DIAGNOSIS — L97409 Non-pressure chronic ulcer of unspecified heel and midfoot with unspecified severity: Secondary | ICD-10-CM | POA: Diagnosis not present

## 2012-07-09 DIAGNOSIS — L02619 Cutaneous abscess of unspecified foot: Secondary | ICD-10-CM | POA: Diagnosis not present

## 2012-07-11 ENCOUNTER — Non-Acute Institutional Stay (SKILLED_NURSING_FACILITY): Payer: Medicare Other | Admitting: Adult Health

## 2012-07-11 ENCOUNTER — Encounter: Payer: Self-pay | Admitting: Adult Health

## 2012-07-11 DIAGNOSIS — I2510 Atherosclerotic heart disease of native coronary artery without angina pectoris: Secondary | ICD-10-CM

## 2012-07-11 DIAGNOSIS — E119 Type 2 diabetes mellitus without complications: Secondary | ICD-10-CM | POA: Diagnosis not present

## 2012-07-11 DIAGNOSIS — E11621 Type 2 diabetes mellitus with foot ulcer: Secondary | ICD-10-CM

## 2012-07-11 DIAGNOSIS — K5900 Constipation, unspecified: Secondary | ICD-10-CM

## 2012-07-11 DIAGNOSIS — I5022 Chronic systolic (congestive) heart failure: Secondary | ICD-10-CM

## 2012-07-11 DIAGNOSIS — E785 Hyperlipidemia, unspecified: Secondary | ICD-10-CM

## 2012-07-11 DIAGNOSIS — L97509 Non-pressure chronic ulcer of other part of unspecified foot with unspecified severity: Secondary | ICD-10-CM

## 2012-07-11 DIAGNOSIS — E1169 Type 2 diabetes mellitus with other specified complication: Secondary | ICD-10-CM

## 2012-07-11 NOTE — Progress Notes (Signed)
  Subjective:    Patient ID: [name] [name], female    DOB: May 15, 1932, 79 y.o.   MRN: [id number]  HPI This is a 79 year old female who is for discharge home and will have Home health PT, OT and Nursing. She has been admitted to [facility] on 06/11/12 from [facility] with principal diagnosis of Left diabetic foot ulcer S/P left great toe amputation. She has been admitted for a short-term rehabilitation   Review of Systems  Constitutional: Negative.   HENT: Negative.   Eyes: Negative.   Respiratory: Negative for cough and shortness of breath.   Cardiovascular: Positive for leg swelling. Negative for chest pain.  Gastrointestinal: Negative.   Endocrine: Negative.   Genitourinary: Negative.   Neurological: Negative.   Hematological: Negative for adenopathy. Does not bruise/bleed easily.  Psychiatric/Behavioral: Negative.        Objective:   Physical Exam  Constitutional: She is oriented to person, place, and time. She appears well-developed and well-nourished.  HENT:  Head: Normocephalic and atraumatic.  Right Ear: External ear normal.  Left Ear: External ear normal.  Eyes: Conjunctivae are normal. Pupils are equal, round, and reactive to light.  Neck: Normal range of motion. Neck supple.  Cardiovascular: Normal rate and regular rhythm.   Pulmonary/Chest: Effort normal and breath sounds normal. No respiratory distress.  Abdominal: Soft. Bowel sounds are normal.  Musculoskeletal: She exhibits edema. She exhibits no tenderness.  BLE edema   R 2+                        L 3+ LLE has bandage  Neurological: She is alert and oriented to person, place, and time.  Skin: Skin is warm and dry.  Psychiatric: She has a normal mood and affect. Her behavior is normal. Judgment and thought content normal.   LABS; 06/16/12  Wbc 12.2  hgb 9.6  hct 30.2  NA 134  K 4.7  Glucose 277 BUN 19  Creatinine 1.06   Medications reviewed per [facility]    Assessment & Plan:   Unspecified  constipation - no complaints of; continue Colace  Diabetic foot ulcer- LEFT S/P Left great toe amputation  Coronary artery disease - stable; continue Plavix  Chronic systolic heart failure - stable; continue Lasix and Isordil  INSOMNIA-SLEEP DISORDER-UNSPEC - no complaints of  DIABETES MELLITUS, TYPE II - well-controlled; continue Amaryl and sliding scale Humalog  HYPERLIPIDEMIA - stable; continue Lipitor  HYPERTENSION - well-controlled

## 2012-07-17 DIAGNOSIS — E1149 Type 2 diabetes mellitus with other diabetic neurological complication: Secondary | ICD-10-CM | POA: Diagnosis not present

## 2012-07-17 DIAGNOSIS — L97409 Non-pressure chronic ulcer of unspecified heel and midfoot with unspecified severity: Secondary | ICD-10-CM | POA: Diagnosis not present

## 2012-07-17 DIAGNOSIS — L97929 Non-pressure chronic ulcer of unspecified part of left lower leg with unspecified severity: Secondary | ICD-10-CM | POA: Diagnosis not present

## 2012-07-25 DIAGNOSIS — L97409 Non-pressure chronic ulcer of unspecified heel and midfoot with unspecified severity: Secondary | ICD-10-CM | POA: Diagnosis not present

## 2012-07-25 DIAGNOSIS — L97919 Non-pressure chronic ulcer of unspecified part of right lower leg with unspecified severity: Secondary | ICD-10-CM | POA: Diagnosis not present

## 2012-07-25 DIAGNOSIS — E1149 Type 2 diabetes mellitus with other diabetic neurological complication: Secondary | ICD-10-CM | POA: Diagnosis not present

## 2012-08-05 DIAGNOSIS — R269 Unspecified abnormalities of gait and mobility: Secondary | ICD-10-CM | POA: Diagnosis not present

## 2012-08-05 DIAGNOSIS — E1169 Type 2 diabetes mellitus with other specified complication: Secondary | ICD-10-CM | POA: Diagnosis not present

## 2012-08-05 DIAGNOSIS — L97509 Non-pressure chronic ulcer of other part of unspecified foot with unspecified severity: Secondary | ICD-10-CM | POA: Diagnosis not present

## 2012-08-05 DIAGNOSIS — I509 Heart failure, unspecified: Secondary | ICD-10-CM | POA: Diagnosis not present

## 2012-08-05 DIAGNOSIS — E1149 Type 2 diabetes mellitus with other diabetic neurological complication: Secondary | ICD-10-CM | POA: Diagnosis not present

## 2012-08-05 DIAGNOSIS — L97409 Non-pressure chronic ulcer of unspecified heel and midfoot with unspecified severity: Secondary | ICD-10-CM | POA: Diagnosis not present

## 2012-08-05 DIAGNOSIS — Z4789 Encounter for other orthopedic aftercare: Secondary | ICD-10-CM | POA: Diagnosis not present

## 2012-08-07 DIAGNOSIS — E1169 Type 2 diabetes mellitus with other specified complication: Secondary | ICD-10-CM | POA: Diagnosis not present

## 2012-08-07 DIAGNOSIS — L97509 Non-pressure chronic ulcer of other part of unspecified foot with unspecified severity: Secondary | ICD-10-CM | POA: Diagnosis not present

## 2012-08-07 DIAGNOSIS — I509 Heart failure, unspecified: Secondary | ICD-10-CM | POA: Diagnosis not present

## 2012-08-07 DIAGNOSIS — R269 Unspecified abnormalities of gait and mobility: Secondary | ICD-10-CM | POA: Diagnosis not present

## 2012-08-07 DIAGNOSIS — Z4789 Encounter for other orthopedic aftercare: Secondary | ICD-10-CM | POA: Diagnosis not present

## 2012-08-09 DIAGNOSIS — L97509 Non-pressure chronic ulcer of other part of unspecified foot with unspecified severity: Secondary | ICD-10-CM | POA: Diagnosis not present

## 2012-08-09 DIAGNOSIS — R269 Unspecified abnormalities of gait and mobility: Secondary | ICD-10-CM | POA: Diagnosis not present

## 2012-08-09 DIAGNOSIS — Z4789 Encounter for other orthopedic aftercare: Secondary | ICD-10-CM | POA: Diagnosis not present

## 2012-08-09 DIAGNOSIS — E1169 Type 2 diabetes mellitus with other specified complication: Secondary | ICD-10-CM | POA: Diagnosis not present

## 2012-08-09 DIAGNOSIS — I509 Heart failure, unspecified: Secondary | ICD-10-CM | POA: Diagnosis not present

## 2012-08-13 DIAGNOSIS — R269 Unspecified abnormalities of gait and mobility: Secondary | ICD-10-CM | POA: Diagnosis not present

## 2012-08-13 DIAGNOSIS — L97509 Non-pressure chronic ulcer of other part of unspecified foot with unspecified severity: Secondary | ICD-10-CM | POA: Diagnosis not present

## 2012-08-13 DIAGNOSIS — E1169 Type 2 diabetes mellitus with other specified complication: Secondary | ICD-10-CM | POA: Diagnosis not present

## 2012-08-13 DIAGNOSIS — Z4789 Encounter for other orthopedic aftercare: Secondary | ICD-10-CM | POA: Diagnosis not present

## 2012-08-13 DIAGNOSIS — I509 Heart failure, unspecified: Secondary | ICD-10-CM | POA: Diagnosis not present

## 2012-08-15 DIAGNOSIS — I509 Heart failure, unspecified: Secondary | ICD-10-CM | POA: Diagnosis not present

## 2012-08-15 DIAGNOSIS — L97509 Non-pressure chronic ulcer of other part of unspecified foot with unspecified severity: Secondary | ICD-10-CM | POA: Diagnosis not present

## 2012-08-15 DIAGNOSIS — R269 Unspecified abnormalities of gait and mobility: Secondary | ICD-10-CM | POA: Diagnosis not present

## 2012-08-15 DIAGNOSIS — Z4789 Encounter for other orthopedic aftercare: Secondary | ICD-10-CM | POA: Diagnosis not present

## 2012-08-15 DIAGNOSIS — E1169 Type 2 diabetes mellitus with other specified complication: Secondary | ICD-10-CM | POA: Diagnosis not present

## 2012-08-17 DIAGNOSIS — L97509 Non-pressure chronic ulcer of other part of unspecified foot with unspecified severity: Secondary | ICD-10-CM | POA: Diagnosis not present

## 2012-08-17 DIAGNOSIS — R269 Unspecified abnormalities of gait and mobility: Secondary | ICD-10-CM | POA: Diagnosis not present

## 2012-08-17 DIAGNOSIS — Z4789 Encounter for other orthopedic aftercare: Secondary | ICD-10-CM | POA: Diagnosis not present

## 2012-08-17 DIAGNOSIS — E1169 Type 2 diabetes mellitus with other specified complication: Secondary | ICD-10-CM | POA: Diagnosis not present

## 2012-08-17 DIAGNOSIS — I509 Heart failure, unspecified: Secondary | ICD-10-CM | POA: Diagnosis not present

## 2012-08-18 ENCOUNTER — Other Ambulatory Visit: Payer: Self-pay | Admitting: Internal Medicine

## 2012-08-20 DIAGNOSIS — R269 Unspecified abnormalities of gait and mobility: Secondary | ICD-10-CM | POA: Diagnosis not present

## 2012-08-20 DIAGNOSIS — I509 Heart failure, unspecified: Secondary | ICD-10-CM | POA: Diagnosis not present

## 2012-08-20 DIAGNOSIS — L97509 Non-pressure chronic ulcer of other part of unspecified foot with unspecified severity: Secondary | ICD-10-CM | POA: Diagnosis not present

## 2012-08-20 DIAGNOSIS — Z4789 Encounter for other orthopedic aftercare: Secondary | ICD-10-CM | POA: Diagnosis not present

## 2012-08-20 DIAGNOSIS — E1169 Type 2 diabetes mellitus with other specified complication: Secondary | ICD-10-CM | POA: Diagnosis not present

## 2012-08-20 NOTE — Telephone Encounter (Signed)
Refill done.  

## 2012-08-21 DIAGNOSIS — R269 Unspecified abnormalities of gait and mobility: Secondary | ICD-10-CM | POA: Diagnosis not present

## 2012-08-21 DIAGNOSIS — Z4789 Encounter for other orthopedic aftercare: Secondary | ICD-10-CM | POA: Diagnosis not present

## 2012-08-21 DIAGNOSIS — I509 Heart failure, unspecified: Secondary | ICD-10-CM | POA: Diagnosis not present

## 2012-08-21 DIAGNOSIS — E1169 Type 2 diabetes mellitus with other specified complication: Secondary | ICD-10-CM | POA: Diagnosis not present

## 2012-08-21 DIAGNOSIS — L97509 Non-pressure chronic ulcer of other part of unspecified foot with unspecified severity: Secondary | ICD-10-CM | POA: Diagnosis not present

## 2012-08-22 DIAGNOSIS — L97209 Non-pressure chronic ulcer of unspecified calf with unspecified severity: Secondary | ICD-10-CM | POA: Diagnosis not present

## 2012-08-22 DIAGNOSIS — E1149 Type 2 diabetes mellitus with other diabetic neurological complication: Secondary | ICD-10-CM | POA: Diagnosis not present

## 2012-08-22 DIAGNOSIS — I83219 Varicose veins of right lower extremity with both ulcer of unspecified site and inflammation: Secondary | ICD-10-CM | POA: Diagnosis not present

## 2012-08-22 DIAGNOSIS — L97509 Non-pressure chronic ulcer of other part of unspecified foot with unspecified severity: Secondary | ICD-10-CM | POA: Diagnosis not present

## 2012-08-22 DIAGNOSIS — I509 Heart failure, unspecified: Secondary | ICD-10-CM | POA: Diagnosis not present

## 2012-08-22 DIAGNOSIS — L97929 Non-pressure chronic ulcer of unspecified part of left lower leg with unspecified severity: Secondary | ICD-10-CM | POA: Diagnosis not present

## 2012-08-22 DIAGNOSIS — E1169 Type 2 diabetes mellitus with other specified complication: Secondary | ICD-10-CM | POA: Diagnosis not present

## 2012-08-22 DIAGNOSIS — L97919 Non-pressure chronic ulcer of unspecified part of right lower leg with unspecified severity: Secondary | ICD-10-CM | POA: Diagnosis not present

## 2012-08-22 DIAGNOSIS — Z4789 Encounter for other orthopedic aftercare: Secondary | ICD-10-CM | POA: Diagnosis not present

## 2012-08-22 DIAGNOSIS — R269 Unspecified abnormalities of gait and mobility: Secondary | ICD-10-CM | POA: Diagnosis not present

## 2012-08-27 DIAGNOSIS — L97509 Non-pressure chronic ulcer of other part of unspecified foot with unspecified severity: Secondary | ICD-10-CM | POA: Diagnosis not present

## 2012-08-27 DIAGNOSIS — I509 Heart failure, unspecified: Secondary | ICD-10-CM | POA: Diagnosis not present

## 2012-08-27 DIAGNOSIS — E1169 Type 2 diabetes mellitus with other specified complication: Secondary | ICD-10-CM | POA: Diagnosis not present

## 2012-08-27 DIAGNOSIS — Z4789 Encounter for other orthopedic aftercare: Secondary | ICD-10-CM | POA: Diagnosis not present

## 2012-08-27 DIAGNOSIS — R269 Unspecified abnormalities of gait and mobility: Secondary | ICD-10-CM | POA: Diagnosis not present

## 2012-08-28 DIAGNOSIS — Z4789 Encounter for other orthopedic aftercare: Secondary | ICD-10-CM | POA: Diagnosis not present

## 2012-08-28 DIAGNOSIS — E1169 Type 2 diabetes mellitus with other specified complication: Secondary | ICD-10-CM | POA: Diagnosis not present

## 2012-08-28 DIAGNOSIS — R269 Unspecified abnormalities of gait and mobility: Secondary | ICD-10-CM | POA: Diagnosis not present

## 2012-08-28 DIAGNOSIS — L97509 Non-pressure chronic ulcer of other part of unspecified foot with unspecified severity: Secondary | ICD-10-CM | POA: Diagnosis not present

## 2012-08-28 DIAGNOSIS — I509 Heart failure, unspecified: Secondary | ICD-10-CM | POA: Diagnosis not present

## 2012-08-30 DIAGNOSIS — L97509 Non-pressure chronic ulcer of other part of unspecified foot with unspecified severity: Secondary | ICD-10-CM | POA: Diagnosis not present

## 2012-08-30 DIAGNOSIS — E1169 Type 2 diabetes mellitus with other specified complication: Secondary | ICD-10-CM | POA: Diagnosis not present

## 2012-08-30 DIAGNOSIS — I509 Heart failure, unspecified: Secondary | ICD-10-CM | POA: Diagnosis not present

## 2012-08-30 DIAGNOSIS — Z4789 Encounter for other orthopedic aftercare: Secondary | ICD-10-CM | POA: Diagnosis not present

## 2012-08-30 DIAGNOSIS — R269 Unspecified abnormalities of gait and mobility: Secondary | ICD-10-CM | POA: Diagnosis not present

## 2012-09-03 DIAGNOSIS — R269 Unspecified abnormalities of gait and mobility: Secondary | ICD-10-CM | POA: Diagnosis not present

## 2012-09-03 DIAGNOSIS — I509 Heart failure, unspecified: Secondary | ICD-10-CM | POA: Diagnosis not present

## 2012-09-03 DIAGNOSIS — Z4789 Encounter for other orthopedic aftercare: Secondary | ICD-10-CM | POA: Diagnosis not present

## 2012-09-03 DIAGNOSIS — L97509 Non-pressure chronic ulcer of other part of unspecified foot with unspecified severity: Secondary | ICD-10-CM | POA: Diagnosis not present

## 2012-09-03 DIAGNOSIS — E1169 Type 2 diabetes mellitus with other specified complication: Secondary | ICD-10-CM | POA: Diagnosis not present

## 2012-09-04 DIAGNOSIS — R269 Unspecified abnormalities of gait and mobility: Secondary | ICD-10-CM | POA: Diagnosis not present

## 2012-09-04 DIAGNOSIS — I509 Heart failure, unspecified: Secondary | ICD-10-CM | POA: Diagnosis not present

## 2012-09-04 DIAGNOSIS — E1169 Type 2 diabetes mellitus with other specified complication: Secondary | ICD-10-CM | POA: Diagnosis not present

## 2012-09-04 DIAGNOSIS — L97509 Non-pressure chronic ulcer of other part of unspecified foot with unspecified severity: Secondary | ICD-10-CM | POA: Diagnosis not present

## 2012-09-04 DIAGNOSIS — Z4789 Encounter for other orthopedic aftercare: Secondary | ICD-10-CM | POA: Diagnosis not present

## 2012-09-06 DIAGNOSIS — E1169 Type 2 diabetes mellitus with other specified complication: Secondary | ICD-10-CM | POA: Diagnosis not present

## 2012-09-06 DIAGNOSIS — Z4789 Encounter for other orthopedic aftercare: Secondary | ICD-10-CM | POA: Diagnosis not present

## 2012-09-06 DIAGNOSIS — I509 Heart failure, unspecified: Secondary | ICD-10-CM | POA: Diagnosis not present

## 2012-09-06 DIAGNOSIS — L97509 Non-pressure chronic ulcer of other part of unspecified foot with unspecified severity: Secondary | ICD-10-CM | POA: Diagnosis not present

## 2012-09-06 DIAGNOSIS — R269 Unspecified abnormalities of gait and mobility: Secondary | ICD-10-CM | POA: Diagnosis not present

## 2012-09-07 DIAGNOSIS — I509 Heart failure, unspecified: Secondary | ICD-10-CM | POA: Diagnosis not present

## 2012-09-07 DIAGNOSIS — Z4789 Encounter for other orthopedic aftercare: Secondary | ICD-10-CM | POA: Diagnosis not present

## 2012-09-07 DIAGNOSIS — E1169 Type 2 diabetes mellitus with other specified complication: Secondary | ICD-10-CM | POA: Diagnosis not present

## 2012-09-07 DIAGNOSIS — R269 Unspecified abnormalities of gait and mobility: Secondary | ICD-10-CM | POA: Diagnosis not present

## 2012-09-07 DIAGNOSIS — L97509 Non-pressure chronic ulcer of other part of unspecified foot with unspecified severity: Secondary | ICD-10-CM | POA: Diagnosis not present

## 2012-09-10 DIAGNOSIS — E1169 Type 2 diabetes mellitus with other specified complication: Secondary | ICD-10-CM | POA: Diagnosis not present

## 2012-09-10 DIAGNOSIS — L97509 Non-pressure chronic ulcer of other part of unspecified foot with unspecified severity: Secondary | ICD-10-CM | POA: Diagnosis not present

## 2012-09-10 DIAGNOSIS — I509 Heart failure, unspecified: Secondary | ICD-10-CM | POA: Diagnosis not present

## 2012-09-10 DIAGNOSIS — R269 Unspecified abnormalities of gait and mobility: Secondary | ICD-10-CM | POA: Diagnosis not present

## 2012-09-10 DIAGNOSIS — Z4789 Encounter for other orthopedic aftercare: Secondary | ICD-10-CM | POA: Diagnosis not present

## 2012-09-11 DIAGNOSIS — Z4789 Encounter for other orthopedic aftercare: Secondary | ICD-10-CM | POA: Diagnosis not present

## 2012-09-11 DIAGNOSIS — R269 Unspecified abnormalities of gait and mobility: Secondary | ICD-10-CM | POA: Diagnosis not present

## 2012-09-11 DIAGNOSIS — L97509 Non-pressure chronic ulcer of other part of unspecified foot with unspecified severity: Secondary | ICD-10-CM | POA: Diagnosis not present

## 2012-09-11 DIAGNOSIS — E1169 Type 2 diabetes mellitus with other specified complication: Secondary | ICD-10-CM | POA: Diagnosis not present

## 2012-09-11 DIAGNOSIS — I509 Heart failure, unspecified: Secondary | ICD-10-CM | POA: Diagnosis not present

## 2012-09-12 DIAGNOSIS — L97209 Non-pressure chronic ulcer of unspecified calf with unspecified severity: Secondary | ICD-10-CM | POA: Diagnosis not present

## 2012-09-12 DIAGNOSIS — L97929 Non-pressure chronic ulcer of unspecified part of left lower leg with unspecified severity: Secondary | ICD-10-CM | POA: Diagnosis not present

## 2012-09-12 DIAGNOSIS — E1149 Type 2 diabetes mellitus with other diabetic neurological complication: Secondary | ICD-10-CM | POA: Diagnosis not present

## 2012-09-13 DIAGNOSIS — L97509 Non-pressure chronic ulcer of other part of unspecified foot with unspecified severity: Secondary | ICD-10-CM | POA: Diagnosis not present

## 2012-09-13 DIAGNOSIS — I509 Heart failure, unspecified: Secondary | ICD-10-CM | POA: Diagnosis not present

## 2012-09-13 DIAGNOSIS — Z4789 Encounter for other orthopedic aftercare: Secondary | ICD-10-CM | POA: Diagnosis not present

## 2012-09-13 DIAGNOSIS — E1169 Type 2 diabetes mellitus with other specified complication: Secondary | ICD-10-CM | POA: Diagnosis not present

## 2012-09-13 DIAGNOSIS — R269 Unspecified abnormalities of gait and mobility: Secondary | ICD-10-CM | POA: Diagnosis not present

## 2012-09-17 DIAGNOSIS — L97509 Non-pressure chronic ulcer of other part of unspecified foot with unspecified severity: Secondary | ICD-10-CM | POA: Diagnosis not present

## 2012-09-17 DIAGNOSIS — R269 Unspecified abnormalities of gait and mobility: Secondary | ICD-10-CM | POA: Diagnosis not present

## 2012-09-17 DIAGNOSIS — Z4789 Encounter for other orthopedic aftercare: Secondary | ICD-10-CM | POA: Diagnosis not present

## 2012-09-17 DIAGNOSIS — E1169 Type 2 diabetes mellitus with other specified complication: Secondary | ICD-10-CM | POA: Diagnosis not present

## 2012-09-17 DIAGNOSIS — I509 Heart failure, unspecified: Secondary | ICD-10-CM | POA: Diagnosis not present

## 2012-09-18 ENCOUNTER — Encounter: Payer: Medicare Other | Admitting: Internal Medicine

## 2012-09-18 ENCOUNTER — Other Ambulatory Visit: Payer: Self-pay | Admitting: *Deleted

## 2012-09-19 ENCOUNTER — Other Ambulatory Visit: Payer: Self-pay | Admitting: *Deleted

## 2012-09-19 DIAGNOSIS — I509 Heart failure, unspecified: Secondary | ICD-10-CM | POA: Diagnosis not present

## 2012-09-19 DIAGNOSIS — Z4789 Encounter for other orthopedic aftercare: Secondary | ICD-10-CM | POA: Diagnosis not present

## 2012-09-19 DIAGNOSIS — L97209 Non-pressure chronic ulcer of unspecified calf with unspecified severity: Secondary | ICD-10-CM | POA: Diagnosis not present

## 2012-09-19 DIAGNOSIS — R269 Unspecified abnormalities of gait and mobility: Secondary | ICD-10-CM | POA: Diagnosis not present

## 2012-09-19 DIAGNOSIS — I83219 Varicose veins of right lower extremity with both ulcer of unspecified site and inflammation: Secondary | ICD-10-CM | POA: Diagnosis not present

## 2012-09-19 DIAGNOSIS — E1149 Type 2 diabetes mellitus with other diabetic neurological complication: Secondary | ICD-10-CM | POA: Diagnosis not present

## 2012-09-19 DIAGNOSIS — L97509 Non-pressure chronic ulcer of other part of unspecified foot with unspecified severity: Secondary | ICD-10-CM | POA: Diagnosis not present

## 2012-09-19 DIAGNOSIS — E1169 Type 2 diabetes mellitus with other specified complication: Secondary | ICD-10-CM | POA: Diagnosis not present

## 2012-09-19 MED ORDER — CLOPIDOGREL BISULFATE 75 MG PO TABS: 75.0000 mg | ORAL_TABLET | Freq: Every day | ORAL | Status: DC

## 2012-09-21 ENCOUNTER — Encounter: Payer: Medicare Other | Admitting: Internal Medicine

## 2012-09-24 DIAGNOSIS — Z4789 Encounter for other orthopedic aftercare: Secondary | ICD-10-CM | POA: Diagnosis not present

## 2012-09-24 DIAGNOSIS — E1169 Type 2 diabetes mellitus with other specified complication: Secondary | ICD-10-CM | POA: Diagnosis not present

## 2012-09-24 DIAGNOSIS — R269 Unspecified abnormalities of gait and mobility: Secondary | ICD-10-CM | POA: Diagnosis not present

## 2012-09-24 DIAGNOSIS — I509 Heart failure, unspecified: Secondary | ICD-10-CM | POA: Diagnosis not present

## 2012-09-24 DIAGNOSIS — L97509 Non-pressure chronic ulcer of other part of unspecified foot with unspecified severity: Secondary | ICD-10-CM | POA: Diagnosis not present

## 2012-09-25 DIAGNOSIS — I509 Heart failure, unspecified: Secondary | ICD-10-CM | POA: Diagnosis not present

## 2012-09-25 DIAGNOSIS — E1169 Type 2 diabetes mellitus with other specified complication: Secondary | ICD-10-CM | POA: Diagnosis not present

## 2012-09-25 DIAGNOSIS — L97509 Non-pressure chronic ulcer of other part of unspecified foot with unspecified severity: Secondary | ICD-10-CM | POA: Diagnosis not present

## 2012-09-25 DIAGNOSIS — Z4789 Encounter for other orthopedic aftercare: Secondary | ICD-10-CM | POA: Diagnosis not present

## 2012-09-25 DIAGNOSIS — R269 Unspecified abnormalities of gait and mobility: Secondary | ICD-10-CM | POA: Diagnosis not present

## 2012-09-26 ENCOUNTER — Other Ambulatory Visit: Payer: Self-pay | Admitting: *Deleted

## 2012-09-26 DIAGNOSIS — L97929 Non-pressure chronic ulcer of unspecified part of left lower leg with unspecified severity: Secondary | ICD-10-CM | POA: Diagnosis not present

## 2012-09-26 DIAGNOSIS — L97209 Non-pressure chronic ulcer of unspecified calf with unspecified severity: Secondary | ICD-10-CM | POA: Diagnosis not present

## 2012-09-26 NOTE — Telephone Encounter (Signed)
Error

## 2012-10-03 DIAGNOSIS — R269 Unspecified abnormalities of gait and mobility: Secondary | ICD-10-CM | POA: Diagnosis not present

## 2012-10-03 DIAGNOSIS — L97509 Non-pressure chronic ulcer of other part of unspecified foot with unspecified severity: Secondary | ICD-10-CM | POA: Diagnosis not present

## 2012-10-03 DIAGNOSIS — E1169 Type 2 diabetes mellitus with other specified complication: Secondary | ICD-10-CM | POA: Diagnosis not present

## 2012-10-03 DIAGNOSIS — E1149 Type 2 diabetes mellitus with other diabetic neurological complication: Secondary | ICD-10-CM | POA: Diagnosis not present

## 2012-10-03 DIAGNOSIS — L97919 Non-pressure chronic ulcer of unspecified part of right lower leg with unspecified severity: Secondary | ICD-10-CM | POA: Diagnosis not present

## 2012-10-03 DIAGNOSIS — L97209 Non-pressure chronic ulcer of unspecified calf with unspecified severity: Secondary | ICD-10-CM | POA: Diagnosis not present

## 2012-10-03 DIAGNOSIS — Z4789 Encounter for other orthopedic aftercare: Secondary | ICD-10-CM | POA: Diagnosis not present

## 2012-10-03 DIAGNOSIS — I509 Heart failure, unspecified: Secondary | ICD-10-CM | POA: Diagnosis not present

## 2012-10-04 DIAGNOSIS — R269 Unspecified abnormalities of gait and mobility: Secondary | ICD-10-CM | POA: Diagnosis not present

## 2012-10-04 DIAGNOSIS — M6281 Muscle weakness (generalized): Secondary | ICD-10-CM | POA: Diagnosis not present

## 2012-10-04 DIAGNOSIS — I509 Heart failure, unspecified: Secondary | ICD-10-CM | POA: Diagnosis not present

## 2012-10-04 DIAGNOSIS — E119 Type 2 diabetes mellitus without complications: Secondary | ICD-10-CM | POA: Diagnosis not present

## 2012-10-04 DIAGNOSIS — IMO0001 Reserved for inherently not codable concepts without codable children: Secondary | ICD-10-CM | POA: Diagnosis not present

## 2012-10-09 DIAGNOSIS — I509 Heart failure, unspecified: Secondary | ICD-10-CM | POA: Diagnosis not present

## 2012-10-09 DIAGNOSIS — E119 Type 2 diabetes mellitus without complications: Secondary | ICD-10-CM | POA: Diagnosis not present

## 2012-10-09 DIAGNOSIS — M6281 Muscle weakness (generalized): Secondary | ICD-10-CM | POA: Diagnosis not present

## 2012-10-09 DIAGNOSIS — R269 Unspecified abnormalities of gait and mobility: Secondary | ICD-10-CM | POA: Diagnosis not present

## 2012-10-09 DIAGNOSIS — IMO0001 Reserved for inherently not codable concepts without codable children: Secondary | ICD-10-CM | POA: Diagnosis not present

## 2012-10-11 DIAGNOSIS — I509 Heart failure, unspecified: Secondary | ICD-10-CM | POA: Diagnosis not present

## 2012-10-11 DIAGNOSIS — M6281 Muscle weakness (generalized): Secondary | ICD-10-CM | POA: Diagnosis not present

## 2012-10-11 DIAGNOSIS — R269 Unspecified abnormalities of gait and mobility: Secondary | ICD-10-CM | POA: Diagnosis not present

## 2012-10-11 DIAGNOSIS — IMO0001 Reserved for inherently not codable concepts without codable children: Secondary | ICD-10-CM | POA: Diagnosis not present

## 2012-10-11 DIAGNOSIS — E119 Type 2 diabetes mellitus without complications: Secondary | ICD-10-CM | POA: Diagnosis not present

## 2012-10-16 DIAGNOSIS — E1149 Type 2 diabetes mellitus with other diabetic neurological complication: Secondary | ICD-10-CM | POA: Diagnosis not present

## 2012-10-16 DIAGNOSIS — I83219 Varicose veins of right lower extremity with both ulcer of unspecified site and inflammation: Secondary | ICD-10-CM | POA: Diagnosis not present

## 2012-10-16 DIAGNOSIS — L97209 Non-pressure chronic ulcer of unspecified calf with unspecified severity: Secondary | ICD-10-CM | POA: Diagnosis not present

## 2012-10-20 ENCOUNTER — Other Ambulatory Visit: Payer: Self-pay | Admitting: Internal Medicine

## 2012-10-23 DIAGNOSIS — M6281 Muscle weakness (generalized): Secondary | ICD-10-CM | POA: Diagnosis not present

## 2012-10-23 DIAGNOSIS — E119 Type 2 diabetes mellitus without complications: Secondary | ICD-10-CM | POA: Diagnosis not present

## 2012-10-23 DIAGNOSIS — I509 Heart failure, unspecified: Secondary | ICD-10-CM | POA: Diagnosis not present

## 2012-10-23 DIAGNOSIS — R269 Unspecified abnormalities of gait and mobility: Secondary | ICD-10-CM | POA: Diagnosis not present

## 2012-10-23 DIAGNOSIS — IMO0001 Reserved for inherently not codable concepts without codable children: Secondary | ICD-10-CM | POA: Diagnosis not present

## 2012-10-23 NOTE — Telephone Encounter (Signed)
Refill done.  

## 2012-10-25 DIAGNOSIS — IMO0001 Reserved for inherently not codable concepts without codable children: Secondary | ICD-10-CM | POA: Diagnosis not present

## 2012-10-25 DIAGNOSIS — M6281 Muscle weakness (generalized): Secondary | ICD-10-CM | POA: Diagnosis not present

## 2012-10-25 DIAGNOSIS — I509 Heart failure, unspecified: Secondary | ICD-10-CM | POA: Diagnosis not present

## 2012-10-25 DIAGNOSIS — R269 Unspecified abnormalities of gait and mobility: Secondary | ICD-10-CM | POA: Diagnosis not present

## 2012-10-25 DIAGNOSIS — E119 Type 2 diabetes mellitus without complications: Secondary | ICD-10-CM | POA: Diagnosis not present

## 2012-10-29 DIAGNOSIS — I509 Heart failure, unspecified: Secondary | ICD-10-CM | POA: Diagnosis not present

## 2012-10-29 DIAGNOSIS — IMO0001 Reserved for inherently not codable concepts without codable children: Secondary | ICD-10-CM | POA: Diagnosis not present

## 2012-10-29 DIAGNOSIS — R269 Unspecified abnormalities of gait and mobility: Secondary | ICD-10-CM | POA: Diagnosis not present

## 2012-10-29 DIAGNOSIS — E119 Type 2 diabetes mellitus without complications: Secondary | ICD-10-CM | POA: Diagnosis not present

## 2012-10-29 DIAGNOSIS — M6281 Muscle weakness (generalized): Secondary | ICD-10-CM | POA: Diagnosis not present

## 2012-10-31 DIAGNOSIS — M6281 Muscle weakness (generalized): Secondary | ICD-10-CM | POA: Diagnosis not present

## 2012-10-31 DIAGNOSIS — I509 Heart failure, unspecified: Secondary | ICD-10-CM | POA: Diagnosis not present

## 2012-10-31 DIAGNOSIS — R269 Unspecified abnormalities of gait and mobility: Secondary | ICD-10-CM | POA: Diagnosis not present

## 2012-10-31 DIAGNOSIS — IMO0001 Reserved for inherently not codable concepts without codable children: Secondary | ICD-10-CM | POA: Diagnosis not present

## 2012-10-31 DIAGNOSIS — E119 Type 2 diabetes mellitus without complications: Secondary | ICD-10-CM | POA: Diagnosis not present

## 2012-11-05 DIAGNOSIS — E119 Type 2 diabetes mellitus without complications: Secondary | ICD-10-CM | POA: Diagnosis not present

## 2012-11-05 DIAGNOSIS — M6281 Muscle weakness (generalized): Secondary | ICD-10-CM | POA: Diagnosis not present

## 2012-11-05 DIAGNOSIS — I509 Heart failure, unspecified: Secondary | ICD-10-CM | POA: Diagnosis not present

## 2012-11-05 DIAGNOSIS — IMO0001 Reserved for inherently not codable concepts without codable children: Secondary | ICD-10-CM | POA: Diagnosis not present

## 2012-11-05 DIAGNOSIS — R269 Unspecified abnormalities of gait and mobility: Secondary | ICD-10-CM | POA: Diagnosis not present

## 2012-11-08 DIAGNOSIS — I509 Heart failure, unspecified: Secondary | ICD-10-CM | POA: Diagnosis not present

## 2012-11-08 DIAGNOSIS — M6281 Muscle weakness (generalized): Secondary | ICD-10-CM | POA: Diagnosis not present

## 2012-11-08 DIAGNOSIS — R269 Unspecified abnormalities of gait and mobility: Secondary | ICD-10-CM | POA: Diagnosis not present

## 2012-11-08 DIAGNOSIS — E119 Type 2 diabetes mellitus without complications: Secondary | ICD-10-CM | POA: Diagnosis not present

## 2012-11-08 DIAGNOSIS — IMO0001 Reserved for inherently not codable concepts without codable children: Secondary | ICD-10-CM | POA: Diagnosis not present

## 2012-11-09 ENCOUNTER — Telehealth: Payer: Self-pay | Admitting: Internal Medicine

## 2012-11-09 ENCOUNTER — Other Ambulatory Visit (INDEPENDENT_AMBULATORY_CARE_PROVIDER_SITE_OTHER): Payer: Medicare Other

## 2012-11-09 DIAGNOSIS — N390 Urinary tract infection, site not specified: Secondary | ICD-10-CM | POA: Diagnosis not present

## 2012-11-09 LAB — POCT URINALYSIS DIPSTICK
Bilirubin, UA: NEGATIVE
Glucose, UA: NEGATIVE
Nitrite, UA: NEGATIVE
Urobilinogen, UA: 0.2

## 2012-11-09 NOTE — Telephone Encounter (Addendum)
Has not been seen in a while. 1. Come this afternoon and give [place] a sample---->  send a UA, urine culture DX UTI 2. Call in Ciprofloxacin 250 mg 1 by mouth twice a day #10 no refills 3. ER or urgent care if fever, chills, symptoms severe, abdominal pain. 4. Needs office visit. Please arrange.

## 2012-11-09 NOTE — Telephone Encounter (Addendum)
Pt coming into office today to give urine sample. Pt would like medication sent to [facility] on [address]. Patient scheduled for f/u appt on 11/23/12.

## 2012-11-09 NOTE — Telephone Encounter (Signed)
Patient states that she has symptoms of a UTI and wants to know if she can have something called into her pharmacy. Patient will come in for an appointment if there was availability but there are no opening at our office today and she prefers not to go anywhere else. Can patient leave urine sample at lab? Please advise.

## 2012-11-11 LAB — URINE CULTURE: Colony Count: >=100000

## 2012-11-12 ENCOUNTER — Telehealth: Payer: Self-pay | Admitting: *Deleted

## 2012-11-12 NOTE — Telephone Encounter (Signed)
[facility] Triage Call Report Triage Record Num: [id number] Operator: Alphonsa Overall Patient Name: [name] [name] Call Date & Time: 11/09/2012 9:00:30PM Patient Phone: [phone] PCP: Evelena Peat Patient Gender: Female PCP Fax : [phone] Patient DOB: 03-Sep-1932 Practice Name: [facility] Reason for Call: Caller: [name] [name]/Son in law calling about pain on urination. Onset 11/08/12. Urine sample given 11/09/12. Afebrile. Burning, frequency. Has one or more symptoms and not evaluated. See within 24hours. Care advice given per Urinary Symptom Protocol. Per EPIC record Ciprofloxacin 250 mg 1 by mouth twice a day #10 no refills per Dr Drue Novel ordered earlier. Called into [facility] [phone]. [name] [name] aware. Protocol(s) Used: Urinary Symptoms - Female Recommended Outcome per Protocol: See Provider within 24 hours Reason for Outcome: Has one or more urinary tract symptoms AND has not been previously evaluated Care Advice: ~ Call provider if you develop flank or low back pain, fever, generally feel sick. ~ Call provider if urine is pink, red, smoky or cola colored. Systemic Inflammatory Response Syndrome (SIRS): Watch for signs of a generalized, whole body infection. Occurs within days of a localized infection, especially of the urinary, GI, respiratory or nervous systems; or after a traumatic injury or invasive procedure. - Call EMS 911 if symptoms have worsened, such as increasing confusion or unusual drowsiness; cold and clammy skin; no urine output; rapid respiration (>30/min.) or slow respiration (<10/min.); struggling to breathe. - Go to the ED immediately for early symptoms of rapid pulse >90/min. or rapid breathing >20/min. at rest; chills; oral temperature >100.4 F (38 C) or <96.8 F (36 C) when associated with conditions noted. ~ Increase intake of fluids. Try to drink 8 oz. (.2 liter) every hour when awake, unless on restricted fluids for other medical reasons. Include at least two 8  oz. (.2 liter) glasses of unsweetened cranberry juice each day. Take sips of fluid or eat ice chips if nauseated or vomiting. ~

## 2012-11-14 ENCOUNTER — Telehealth: Payer: Self-pay | Admitting: *Deleted

## 2012-11-14 DIAGNOSIS — E119 Type 2 diabetes mellitus without complications: Secondary | ICD-10-CM | POA: Diagnosis not present

## 2012-11-14 DIAGNOSIS — IMO0001 Reserved for inherently not codable concepts without codable children: Secondary | ICD-10-CM | POA: Diagnosis not present

## 2012-11-14 DIAGNOSIS — I509 Heart failure, unspecified: Secondary | ICD-10-CM | POA: Diagnosis not present

## 2012-11-14 DIAGNOSIS — R269 Unspecified abnormalities of gait and mobility: Secondary | ICD-10-CM | POA: Diagnosis not present

## 2012-11-14 DIAGNOSIS — M6281 Muscle weakness (generalized): Secondary | ICD-10-CM | POA: Diagnosis not present

## 2012-11-14 NOTE — Telephone Encounter (Signed)
Message copied by Eustace Quail on Wed Nov 14, 2012  8:22 AM ------      Message from: [name] E      Created: Tue Nov 13, 2012  4:41 PM       Advise patient, urine culture showed a contaminant.      Stop Cipro      If she's not better needs office visit ------

## 2012-11-14 NOTE — Telephone Encounter (Signed)
Pt already finished Cipro. Pt states feels better, scheduled to see Dr. Drue Novel on 11/23/12. DJR

## 2012-11-15 DIAGNOSIS — E1149 Type 2 diabetes mellitus with other diabetic neurological complication: Secondary | ICD-10-CM | POA: Diagnosis not present

## 2012-11-15 DIAGNOSIS — I83219 Varicose veins of right lower extremity with both ulcer of unspecified site and inflammation: Secondary | ICD-10-CM | POA: Diagnosis not present

## 2012-11-15 DIAGNOSIS — L97209 Non-pressure chronic ulcer of unspecified calf with unspecified severity: Secondary | ICD-10-CM | POA: Diagnosis not present

## 2012-11-19 ENCOUNTER — Telehealth: Payer: Self-pay | Admitting: Internal Medicine

## 2012-11-19 NOTE — Telephone Encounter (Signed)
11/19/2012  Pt feels like her UTI has never really cleared up.  She is still having all the symptoms (itchy, stingy, going to bathroom frequently and not feeling like her bladder is empty).  No fever.  Please advise her what she needs to do.  Pt has sterilized bottle for urine specimen that her daughter can bring sample by office later today if she needs to.  She has appt with Dr. Drue Novel this Friday at 2:30.  bw

## 2012-11-19 NOTE — Telephone Encounter (Signed)
Appointment made for Tuesday 11/20/12 at 11:15 with Dr. Drue Novel for follow up on possible UTI

## 2012-11-20 ENCOUNTER — Encounter: Payer: Self-pay | Admitting: Internal Medicine

## 2012-11-20 ENCOUNTER — Ambulatory Visit (INDEPENDENT_AMBULATORY_CARE_PROVIDER_SITE_OTHER): Payer: Medicare Other | Admitting: Internal Medicine

## 2012-11-20 VITALS — BP 158/80 | HR 73 | Temp 98.5°F

## 2012-11-20 DIAGNOSIS — M6281 Muscle weakness (generalized): Secondary | ICD-10-CM | POA: Diagnosis not present

## 2012-11-20 DIAGNOSIS — I10 Essential (primary) hypertension: Secondary | ICD-10-CM

## 2012-11-20 DIAGNOSIS — E119 Type 2 diabetes mellitus without complications: Secondary | ICD-10-CM

## 2012-11-20 DIAGNOSIS — N390 Urinary tract infection, site not specified: Secondary | ICD-10-CM | POA: Insufficient documentation

## 2012-11-20 DIAGNOSIS — D509 Iron deficiency anemia, unspecified: Secondary | ICD-10-CM | POA: Diagnosis not present

## 2012-11-20 DIAGNOSIS — IMO0001 Reserved for inherently not codable concepts without codable children: Secondary | ICD-10-CM | POA: Diagnosis not present

## 2012-11-20 DIAGNOSIS — R269 Unspecified abnormalities of gait and mobility: Secondary | ICD-10-CM | POA: Diagnosis not present

## 2012-11-20 DIAGNOSIS — I509 Heart failure, unspecified: Secondary | ICD-10-CM | POA: Diagnosis not present

## 2012-11-20 LAB — CBC WITH DIFFERENTIAL/PLATELET
Basophils Absolute: 0 10*3/uL (ref 0.0–0.1)
Eosinophils Relative: 2.2 % (ref 0.0–5.0)
Monocytes Relative: 5.2 % (ref 3.0–12.0)
Neutrophils Relative %: 75.5 % (ref 43.0–77.0)
Platelets: 252 10*3/uL (ref 150.0–400.0)
RDW: 16.2 % — ABNORMAL HIGH (ref 11.5–14.6)
WBC: 13.8 10*3/uL — ABNORMAL HIGH (ref 4.5–10.5)

## 2012-11-20 LAB — HEMOGLOBIN A1C: Hgb A1c MFr Bld: 8.8 % — ABNORMAL HIGH (ref 4.6–6.5)

## 2012-11-20 MED ORDER — RAMIPRIL 5 MG PO CAPS: 5.0000 mg | ORAL_CAPSULE | Freq: Every day | ORAL | Status: DC

## 2012-11-20 MED ORDER — SULFAMETHOXAZOLE-TRIMETHOPRIM 800-160 MG PO TABS: 1.0000 | ORAL_TABLET | Freq: Two times a day (BID) | ORAL | Status: DC

## 2012-11-20 NOTE — Progress Notes (Signed)
Subjective:    Patient ID: [name] [name], female    DOB: [birth year], 79 y.o.   MRN: [id number]  HPI Here for evaluation of a UTI. Several days ago she called complaining of UTI symptoms, was prescribed empiric Cipro, urine culture eventually came back contaminated. After a few days of antibiotics she felt better but symptoms have resurface. Since he is here, we also reviewed the chart: had a L great toe amputation, Dr Lajoyce Corners Also was admited to the hospital 05-2012 due to SOB and hypoxia and subsequently f/u by another group of doctors . We also assessed her anemia, diabetes and high blood pressure.  Past Medical History  Diagnosis Date  . Diabetes mellitus   . Hyperlipidemia   . Hypertension   . CAD (coronary artery disease)     s/p stent R side 9/08, re stenosis 1/09, s/p angioplasty 1/09 and a stent on 12/09; re-angioplasty 5/11  . CHF (congestive heart failure)   . Diverticulitis 12/08  . 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 Barrett esophagus     EGD 08/2009, next 2013  . Carotid arterial disease     s/p stent R side 9-08, re stenosis 1-09, s/p angioplasty 1-09 and a stent on 12-09, last  angiogram 06-24-2009  . Abnormal CT scan, chest     last 06-08-2009, see report   . ICD (implantable cardiac defibrillator) in place      12-08-- [employer]   . Stroke   . Collagen vascular disease   . GERD (gastroesophageal reflux disease)     occ  . Anemia     hx   Past Surgical History  Procedure Laterality Date  . Arterial bypass surgry      08/08/2000  LIMA to LAD, SVG to intermediate, SVG to OM2, SVG to distal RCA  . Cardiac defibrillator placement      [employer]  . Amputation Left 06/08/2012    Procedure: AMPUTATION RAY;  Surgeon: Nadara Mustard, MD;  Location: [facility] OR;  Service: Orthopedics;  Laterality: Left;  Left Foot 1st Ray Amputation     History   Social History  . Marital Status: Widowed    Spouse Name: N/A    Number of Children: 6  . Years of Education: N/A    Occupational History  . Not on file.   Social History Main Topics  . Smoking status: Never Smoker   . Smokeless tobacco: Never Used  . Alcohol Use: No  . Drug Use: No  . Sexual Activity: No   Other Topics Concern  . Not on file   Social History Narrative   Widow, lives by herself, somebody stays with her every night   six children   Doesn't drive anymore , limited    ADLs   HH visits her     Review of Systems Current symptoms are urinary frequency, unable to urinate much whenever she does go to the bathroom. No fevers, did have chills several days ago but that resolved. No nausea, vomiting or flank pain. Color of the urine is normal.     Objective:   Physical Exam BP 158/80  Pulse 73  Temp(Src) 98.5 F (36.9 C)  SpO2 98% General -- alert, well-developed, NAD.  Lungs -- normal respiratory effort, no intercostal retractions, no accessory muscle use, and normal breath sounds.  Abdomen-- Not distended, good bowel sounds,soft, non-tender.  Neurologic--  alert & oriented X3. Speech normal Psych-- Cognition and judgment appear intact. Cooperative with normal attention span and  concentration. No anxious appearing , no depressed appearing.       Assessment & Plan:

## 2012-11-20 NOTE — Patient Instructions (Signed)
Start Bactrim as prescribed for one week, drink plenty of fluids while you until they UTI symptoms clear Go back on ramipril Schedule a lab appointment in 2 weeks ----> BMP hypertension Next visit to see me in 4-6 weeks.

## 2012-11-20 NOTE — Assessment & Plan Note (Signed)
Based on the last CBC she was recommended IV iron, that did not happened because she had surgery. Recheck a CBC

## 2012-11-20 NOTE — Assessment & Plan Note (Signed)
Recently had symptoms of a UTI, started on empiric Cipro, urine culture eventually came back contaminated, she has symptoms again. Udip today + leukocytes Plan: Urine culture,   Bactrim DS for one week, drink plenty of fluids.

## 2012-11-20 NOTE — Assessment & Plan Note (Signed)
BP slightly elevated today, she ran out of ramipril ~ a month ago. Plan: Labs Restart ramipril 5 mg Close monitoring or her BMP, recheck in 2 weeks

## 2012-11-20 NOTE — Assessment & Plan Note (Signed)
On Amaryl only, did take insulin for a while while post op, check a a1C

## 2012-11-21 DIAGNOSIS — R269 Unspecified abnormalities of gait and mobility: Secondary | ICD-10-CM | POA: Diagnosis not present

## 2012-11-21 DIAGNOSIS — E119 Type 2 diabetes mellitus without complications: Secondary | ICD-10-CM | POA: Diagnosis not present

## 2012-11-21 DIAGNOSIS — M6281 Muscle weakness (generalized): Secondary | ICD-10-CM | POA: Diagnosis not present

## 2012-11-21 DIAGNOSIS — I509 Heart failure, unspecified: Secondary | ICD-10-CM | POA: Diagnosis not present

## 2012-11-21 DIAGNOSIS — IMO0001 Reserved for inherently not codable concepts without codable children: Secondary | ICD-10-CM | POA: Diagnosis not present

## 2012-11-21 LAB — URINE CULTURE: Colony Count: NO GROWTH

## 2012-11-23 ENCOUNTER — Ambulatory Visit: Payer: Medicare Other | Admitting: Internal Medicine

## 2012-11-26 DIAGNOSIS — I83219 Varicose veins of right lower extremity with both ulcer of unspecified site and inflammation: Secondary | ICD-10-CM | POA: Diagnosis not present

## 2012-11-26 DIAGNOSIS — E1149 Type 2 diabetes mellitus with other diabetic neurological complication: Secondary | ICD-10-CM | POA: Diagnosis not present

## 2012-11-26 DIAGNOSIS — L97209 Non-pressure chronic ulcer of unspecified calf with unspecified severity: Secondary | ICD-10-CM | POA: Diagnosis not present

## 2012-11-28 DIAGNOSIS — I509 Heart failure, unspecified: Secondary | ICD-10-CM | POA: Diagnosis not present

## 2012-11-28 DIAGNOSIS — IMO0001 Reserved for inherently not codable concepts without codable children: Secondary | ICD-10-CM | POA: Diagnosis not present

## 2012-11-28 DIAGNOSIS — R269 Unspecified abnormalities of gait and mobility: Secondary | ICD-10-CM | POA: Diagnosis not present

## 2012-11-28 DIAGNOSIS — E119 Type 2 diabetes mellitus without complications: Secondary | ICD-10-CM | POA: Diagnosis not present

## 2012-11-28 DIAGNOSIS — M6281 Muscle weakness (generalized): Secondary | ICD-10-CM | POA: Diagnosis not present

## 2012-11-29 ENCOUNTER — Telehealth: Payer: Self-pay | Admitting: Internal Medicine

## 2012-11-29 DIAGNOSIS — E119 Type 2 diabetes mellitus without complications: Secondary | ICD-10-CM | POA: Diagnosis not present

## 2012-11-29 DIAGNOSIS — IMO0001 Reserved for inherently not codable concepts without codable children: Secondary | ICD-10-CM | POA: Diagnosis not present

## 2012-11-29 DIAGNOSIS — M6281 Muscle weakness (generalized): Secondary | ICD-10-CM | POA: Diagnosis not present

## 2012-11-29 DIAGNOSIS — R269 Unspecified abnormalities of gait and mobility: Secondary | ICD-10-CM | POA: Diagnosis not present

## 2012-11-29 DIAGNOSIS — I509 Heart failure, unspecified: Secondary | ICD-10-CM | POA: Diagnosis not present

## 2012-11-29 NOTE — Telephone Encounter (Signed)
Patient was supposed to come back and repeat labs, please arrange

## 2012-12-04 ENCOUNTER — Other Ambulatory Visit (INDEPENDENT_AMBULATORY_CARE_PROVIDER_SITE_OTHER): Payer: Medicare Other

## 2012-12-04 DIAGNOSIS — E119 Type 2 diabetes mellitus without complications: Secondary | ICD-10-CM | POA: Diagnosis not present

## 2012-12-04 DIAGNOSIS — I10 Essential (primary) hypertension: Secondary | ICD-10-CM | POA: Diagnosis not present

## 2012-12-04 DIAGNOSIS — N390 Urinary tract infection, site not specified: Secondary | ICD-10-CM | POA: Diagnosis not present

## 2012-12-04 DIAGNOSIS — E785 Hyperlipidemia, unspecified: Secondary | ICD-10-CM

## 2012-12-04 LAB — CBC WITH DIFFERENTIAL/PLATELET
Basophils Relative: 0.5 % (ref 0.0–3.0)
Eosinophils Absolute: 0.2 10*3/uL (ref 0.0–0.7)
Eosinophils Relative: 2.3 % (ref 0.0–5.0)
Lymphocytes Relative: 21.5 % (ref 12.0–46.0)
MCHC: 33.2 g/dL (ref 30.0–36.0)
MCV: 79.4 fl (ref 78.0–100.0)
Monocytes Absolute: 0.8 10*3/uL (ref 0.1–1.0)
Neutrophils Relative %: 68.5 % (ref 43.0–77.0)
Platelets: 257 10*3/uL (ref 150.0–400.0)
RBC: 3.31 Mil/uL — ABNORMAL LOW (ref 3.87–5.11)
WBC: 10.8 10*3/uL — ABNORMAL HIGH (ref 4.5–10.5)

## 2012-12-04 LAB — POCT URINALYSIS DIPSTICK
Bilirubin, UA: NEGATIVE
Glucose, UA: NEGATIVE
Ketones, UA: NEGATIVE
Nitrite, UA: NEGATIVE
Spec Grav, UA: 1.02
pH, UA: 6

## 2012-12-04 LAB — HEPATIC FUNCTION PANEL
ALT: 11 U/L (ref 0–35)
Bilirubin, Direct: 0 mg/dL (ref 0.0–0.3)
Total Bilirubin: 0.4 mg/dL (ref 0.3–1.2)

## 2012-12-04 LAB — BASIC METABOLIC PANEL
BUN: 33 mg/dL — ABNORMAL HIGH (ref 6–23)
CO2: 26 mEq/L (ref 19–32)
Chloride: 99 mEq/L (ref 96–112)
Glucose, Bld: 236 mg/dL — ABNORMAL HIGH (ref 70–99)
Potassium: 3.8 mEq/L (ref 3.5–5.1)
Sodium: 137 mEq/L (ref 135–145)

## 2012-12-04 LAB — HEMOGLOBIN A1C: Hgb A1c MFr Bld: 8.6 % — ABNORMAL HIGH (ref 4.6–6.5)

## 2012-12-04 NOTE — Addendum Note (Signed)
Addended by: Silvio Pate D on: 12/04/2012 04:47 PM   Modules accepted: Orders

## 2012-12-05 DIAGNOSIS — E1149 Type 2 diabetes mellitus with other diabetic neurological complication: Secondary | ICD-10-CM | POA: Diagnosis not present

## 2012-12-05 DIAGNOSIS — L97209 Non-pressure chronic ulcer of unspecified calf with unspecified severity: Secondary | ICD-10-CM | POA: Diagnosis not present

## 2012-12-05 DIAGNOSIS — I83219 Varicose veins of right lower extremity with both ulcer of unspecified site and inflammation: Secondary | ICD-10-CM | POA: Diagnosis not present

## 2012-12-05 DIAGNOSIS — B351 Tinea unguium: Secondary | ICD-10-CM | POA: Diagnosis not present

## 2012-12-06 LAB — URINE CULTURE: Organism ID, Bacteria: NO GROWTH

## 2012-12-10 ENCOUNTER — Other Ambulatory Visit: Payer: Self-pay | Admitting: *Deleted

## 2012-12-10 ENCOUNTER — Telehealth: Payer: Self-pay | Admitting: Internal Medicine

## 2012-12-10 DIAGNOSIS — R350 Frequency of micturition: Secondary | ICD-10-CM

## 2012-12-10 DIAGNOSIS — D649 Anemia, unspecified: Secondary | ICD-10-CM

## 2012-12-10 MED ORDER — GLIMEPIRIDE 4 MG PO TABS: ORAL_TABLET | ORAL | Status: DC

## 2012-12-10 NOTE — Telephone Encounter (Signed)
Glimepiride refill sent to pharmacy 

## 2012-12-10 NOTE — Telephone Encounter (Addendum)
Please call patient,  Urine culture was negative. Renal function slightly decreased. She continue with anemia, arrange a referral to hematology, DX anemia (she is supposed to have IV iron) Diabetes needs better control, in addition to amaryl start onglyza 2.5 mg 1 po qd , #30, 3 RF, samples ok if abailable. Also needs a followup in 4 weeks approximately.

## 2012-12-11 MED ORDER — SAXAGLIPTIN HCL 2.5 MG PO TABS: 2.5000 mg | ORAL_TABLET | Freq: Every day | ORAL | Status: DC

## 2012-12-11 NOTE — Telephone Encounter (Signed)
Pt notified of results via tele, referral and medication ordered. Pt states is still having frequent urination every 15 mins. DJR

## 2012-12-11 NOTE — Telephone Encounter (Signed)
If she still has urinary symptoms , please arrange a visit with urology, dx Urinary frequency

## 2012-12-12 ENCOUNTER — Other Ambulatory Visit: Payer: Self-pay | Admitting: *Deleted

## 2012-12-12 MED ORDER — CLOPIDOGREL BISULFATE 75 MG PO TABS: 75.0000 mg | ORAL_TABLET | Freq: Every day | ORAL | Status: DC

## 2012-12-13 NOTE — Telephone Encounter (Signed)
Pt still has frequent urination. Urology ordered. DJR

## 2012-12-18 ENCOUNTER — Telehealth: Payer: Self-pay | Admitting: *Deleted

## 2012-12-18 ENCOUNTER — Inpatient Hospital Stay (HOSPITAL_COMMUNITY)
Admission: EM | Admit: 2012-12-18 | Discharge: 2012-12-21 | DRG: 638 | Disposition: A | Discharge status: Home Health | Payer: Medicare Other | Attending: Internal Medicine | Admitting: Internal Medicine

## 2012-12-18 ENCOUNTER — Encounter (HOSPITAL_COMMUNITY): Payer: Self-pay | Admitting: Emergency Medicine

## 2012-12-18 ENCOUNTER — Emergency Department (HOSPITAL_COMMUNITY): Payer: Medicare Other

## 2012-12-18 DIAGNOSIS — D509 Iron deficiency anemia, unspecified: Secondary | ICD-10-CM | POA: Diagnosis present

## 2012-12-18 DIAGNOSIS — Z9861 Coronary angioplasty status: Secondary | ICD-10-CM

## 2012-12-18 DIAGNOSIS — I5042 Chronic combined systolic (congestive) and diastolic (congestive) heart failure: Secondary | ICD-10-CM | POA: Diagnosis present

## 2012-12-18 DIAGNOSIS — Z23 Encounter for immunization: Secondary | ICD-10-CM | POA: Diagnosis not present

## 2012-12-18 DIAGNOSIS — R748 Abnormal levels of other serum enzymes: Secondary | ICD-10-CM | POA: Diagnosis present

## 2012-12-18 DIAGNOSIS — Z7982 Long term (current) use of aspirin: Secondary | ICD-10-CM

## 2012-12-18 DIAGNOSIS — J9819 Other pulmonary collapse: Secondary | ICD-10-CM | POA: Diagnosis not present

## 2012-12-18 DIAGNOSIS — Z95810 Presence of automatic (implantable) cardiac defibrillator: Secondary | ICD-10-CM

## 2012-12-18 DIAGNOSIS — Z7902 Long term (current) use of antithrombotics/antiplatelets: Secondary | ICD-10-CM

## 2012-12-18 DIAGNOSIS — Z79899 Other long term (current) drug therapy: Secondary | ICD-10-CM | POA: Diagnosis not present

## 2012-12-18 DIAGNOSIS — E119 Type 2 diabetes mellitus without complications: Secondary | ICD-10-CM | POA: Diagnosis not present

## 2012-12-18 DIAGNOSIS — I2510 Atherosclerotic heart disease of native coronary artery without angina pectoris: Secondary | ICD-10-CM

## 2012-12-18 DIAGNOSIS — E1142 Type 2 diabetes mellitus with diabetic polyneuropathy: Secondary | ICD-10-CM | POA: Diagnosis present

## 2012-12-18 DIAGNOSIS — I10 Essential (primary) hypertension: Secondary | ICD-10-CM | POA: Diagnosis present

## 2012-12-18 DIAGNOSIS — N390 Urinary tract infection, site not specified: Secondary | ICD-10-CM | POA: Diagnosis present

## 2012-12-18 DIAGNOSIS — E875 Hyperkalemia: Secondary | ICD-10-CM

## 2012-12-18 DIAGNOSIS — IMO0001 Reserved for inherently not codable concepts without codable children: Principal | ICD-10-CM | POA: Diagnosis present

## 2012-12-18 DIAGNOSIS — E871 Hypo-osmolality and hyponatremia: Secondary | ICD-10-CM | POA: Diagnosis present

## 2012-12-18 DIAGNOSIS — R7309 Other abnormal glucose: Secondary | ICD-10-CM | POA: Diagnosis not present

## 2012-12-18 DIAGNOSIS — R739 Hyperglycemia, unspecified: Secondary | ICD-10-CM | POA: Diagnosis present

## 2012-12-18 DIAGNOSIS — I5022 Chronic systolic (congestive) heart failure: Secondary | ICD-10-CM | POA: Diagnosis present

## 2012-12-18 DIAGNOSIS — S98119A Complete traumatic amputation of unspecified great toe, initial encounter: Secondary | ICD-10-CM | POA: Diagnosis not present

## 2012-12-18 DIAGNOSIS — I779 Disorder of arteries and arterioles, unspecified: Secondary | ICD-10-CM | POA: Diagnosis present

## 2012-12-18 DIAGNOSIS — E785 Hyperlipidemia, unspecified: Secondary | ICD-10-CM | POA: Diagnosis present

## 2012-12-18 DIAGNOSIS — Z8673 Personal history of transient ischemic attack (TIA), and cerebral infarction without residual deficits: Secondary | ICD-10-CM

## 2012-12-18 DIAGNOSIS — K8020 Calculus of gallbladder without cholecystitis without obstruction: Secondary | ICD-10-CM

## 2012-12-18 DIAGNOSIS — I509 Heart failure, unspecified: Secondary | ICD-10-CM | POA: Diagnosis present

## 2012-12-18 LAB — URINALYSIS, ROUTINE W REFLEX MICROSCOPIC
Bilirubin Urine: NEGATIVE
Glucose, UA: >1000 mg/dL — AB
Protein, ur: NEGATIVE mg/dL
Specific Gravity, Urine: 1.015 (ref 1.005–1.030)
Urobilinogen, UA: 0.2 mg/dL (ref 0.0–1.0)

## 2012-12-18 LAB — CBC WITH DIFFERENTIAL/PLATELET
Basophils Absolute: 0.1 10*3/uL (ref 0.0–0.1)
Eosinophils Relative: 2 % (ref 0–5)
HCT: 30.2 % — ABNORMAL LOW (ref 36.0–46.0)
Lymphocytes Relative: 17 % (ref 12–46)
Lymphs Abs: 2.2 10*3/uL (ref 0.7–4.0)
MCHC: 33.4 g/dL (ref 30.0–36.0)
MCV: 78.2 fL (ref 78.0–100.0)
Monocytes Absolute: 0.7 10*3/uL (ref 0.1–1.0)
Neutro Abs: 9.3 10*3/uL — ABNORMAL HIGH (ref 1.7–7.7)
Neutrophils Relative %: 75 % (ref 43–77)
RBC: 3.86 MIL/uL — ABNORMAL LOW (ref 3.87–5.11)
RDW: 14.5 % (ref 11.5–15.5)
WBC: 12.3 10*3/uL — ABNORMAL HIGH (ref 4.0–10.5)

## 2012-12-18 LAB — URINE MICROSCOPIC-ADD ON

## 2012-12-18 LAB — BASIC METABOLIC PANEL
CO2: 26 mEq/L (ref 19–32)
Calcium: 10.2 mg/dL (ref 8.4–10.5)
Chloride: 90 mEq/L — ABNORMAL LOW (ref 96–112)
Glucose, Bld: 551 mg/dL (ref 70–99)
Sodium: 127 mEq/L — ABNORMAL LOW (ref 135–145)

## 2012-12-18 LAB — GLUCOSE, CAPILLARY
Glucose-Capillary: 374 mg/dL — ABNORMAL HIGH (ref 70–99)
Glucose-Capillary: 494 mg/dL — ABNORMAL HIGH (ref 70–99)
Glucose-Capillary: 546 mg/dL — ABNORMAL HIGH (ref 70–99)

## 2012-12-18 LAB — POTASSIUM: Potassium: 4.9 mEq/L (ref 3.5–5.1)

## 2012-12-18 LAB — MAGNESIUM: Magnesium: 1.7 mg/dL (ref 1.5–2.5)

## 2012-12-18 MED ORDER — SODIUM CHLORIDE 0.9 % IV BOLUS (SEPSIS)
500.0000 mL | Freq: Once | INTRAVENOUS | Status: AC
  Administered 2012-12-18: 500 mL via INTRAVENOUS

## 2012-12-18 MED ORDER — SODIUM CHLORIDE 0.9 % IV SOLN: INTRAVENOUS | Status: DC

## 2012-12-18 MED ORDER — INSULIN REGULAR BOLUS VIA INFUSION
0.0000 [IU] | Freq: Three times a day (TID) | INTRAVENOUS | Status: DC
  Filled 2012-12-18: qty 10

## 2012-12-18 MED ORDER — DEXTROSE 5 % IV SOLN
1.0000 g | INTRAVENOUS | Status: DC
  Administered 2012-12-19 – 2012-12-20 (×3): 1 g via INTRAVENOUS
  Filled 2012-12-18 (×3): qty 10

## 2012-12-18 MED ORDER — DEXTROSE 5 % IV SOLN
1.0000 g | Freq: Once | INTRAVENOUS | Status: DC
  Filled 2012-12-18: qty 10

## 2012-12-18 MED ORDER — SODIUM POLYSTYRENE SULFONATE 15 GM/60ML PO SUSP
15.0000 g | Freq: Once | ORAL | Status: AC
  Administered 2012-12-18: 15 g via ORAL

## 2012-12-18 MED ORDER — DEXTROSE 50 % IV SOLN: 25.0000 mL | INTRAVENOUS | Status: DC | PRN

## 2012-12-18 MED ORDER — DEXTROSE-NACL 5-0.45 % IV SOLN
INTRAVENOUS | Status: DC
  Administered 2012-12-19: 05:00:00 via INTRAVENOUS

## 2012-12-18 MED ORDER — SODIUM POLYSTYRENE SULFONATE 15 GM/60ML PO SUSP
30.0000 g | Freq: Once | ORAL | Status: DC
  Filled 2012-12-18: qty 120

## 2012-12-18 MED ORDER — INSULIN REGULAR HUMAN 100 UNIT/ML IJ SOLN
INTRAMUSCULAR | Status: DC
  Administered 2012-12-18: 4.9 [IU]/h via INTRAVENOUS
  Filled 2012-12-18: qty 1

## 2012-12-18 NOTE — Telephone Encounter (Signed)
Spoke with patient. She is going to have her daughter drive her to the ER.

## 2012-12-18 NOTE — Progress Notes (Signed)
Attempted to get report on patient. Nurse stated they would call me for report. Gave ED nurse my phone number for them to call for report when ready. Will continue to monitor. Nelda Marseille, RN

## 2012-12-18 NOTE — H&P (Signed)
[facility] History and Physical  Patient: [name] [name]  ZOX:[id number]  DOB: 04-27-1932  DOA: 12/18/2012  Referring physician: Lottie Mussel, PA-C PCP: [name], MD  Consults:     Chief Complaint: Hyperglycemia  HPI: [name] [name] is a 79 y.o. female with Past medical history of diabetes mellitus, coronary artery disease, CHF systolic, carotid artery disease, hypertension. The patient presented with the complaint of generalized fatigue and weakness which has been ongoing since last few weeks. She also noted that since last 2 days her sugar has been running high and has seen her PCP who has started her on new antihypertensive medication 3 days ago. The patient does not remember the name of the medication. She is also mentioning that she has been having urinary frequency since last few weeks. She denies any fever, chills, chest pain, nausea, vomiting, diarrhea. Other than that changing the diabetic medication she does not have any medication change. She has chronic lower leg swelling on the left side which family mentions is about the same. But appears red. She denies any new focal neurological deficit or fever or chills. Her  Review of Systems: as mentioned in the history of present illness.  A Comprehensive review of the other systems is negative.  Past Medical History  Diagnosis Date  . Diabetes mellitus   . Hyperlipidemia   . Hypertension   . CAD (coronary artery disease)     s/p stent R side 9/08, re stenosis 1/09, s/p angioplasty 1/09 and a stent on 12/09; re-angioplasty 5/11  . CHF (congestive heart failure)   . Diverticulitis 12/08  . 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 Barrett esophagus     EGD 08/2009, next 2013  . Carotid arterial disease     s/p stent R side 9-08, re stenosis 1-09, s/p angioplasty 1-09 and a stent on 12-09, last  angiogram 06-24-2009  . Abnormal CT scan, chest     last 06-08-2009, see report   . ICD (implantable cardiac defibrillator) in place       12-08-- [employer]   . Stroke   . Collagen vascular disease   . GERD (gastroesophageal reflux disease)     occ  . Anemia     hx   Past Surgical History  Procedure Laterality Date  . Arterial bypass surgry      08/08/2000  LIMA to LAD, SVG to intermediate, SVG to OM2, SVG to distal RCA  . Cardiac defibrillator placement      [employer]  . Amputation Left 06/08/2012    Procedure: AMPUTATION RAY;  Surgeon: Nadara Mustard, MD;  Location: [facility] OR;  Service: Orthopedics;  Laterality: Left;  Left Foot 1st Ray Amputation   Social History:  reports that she has never smoked. She has never used smokeless tobacco. She reports that she does not drink alcohol or use illicit drugs. Patient is coming from home. Independent for most of her  ADL.  No Known Allergies  Family History  Problem Relation Age of Onset  . Breast cancer Mother 75  . Colon cancer Neg Hx   . Tuberculosis Father     Prior to Admission medications   Medication Sig Start Date End Date Taking? Authorizing Provider  aspirin 325 MG tablet Take 325 mg by mouth daily.    Yes Historical Provider, MD  atorvastatin (LIPITOR) 40 MG tablet Take 40 mg by mouth daily.   Yes Historical Provider, MD  clopidogrel (PLAVIX) 75 MG tablet Take 75 mg by  mouth daily.   Yes Historical Provider, MD  furosemide (LASIX) 40 MG tablet Take 40 mg by mouth daily.  03/21/12  Yes Herby Abraham, MD  glimepiride (AMARYL) 4 MG tablet Take 4 mg by mouth daily before breakfast.   Yes Historical Provider, MD  metFORMIN (GLUCOPHAGE) 1000 MG tablet Take 1,000 mg by mouth 2 (two) times daily with a meal.   Yes Historical Provider, MD  metoprolol (LOPRESSOR) 50 MG tablet Take 50 mg by mouth 2 (two) times daily.   Yes Historical Provider, MD  potassium chloride (K-DUR) 10 MEQ tablet Take 10 mEq by mouth daily.   Yes Historical Provider, MD  ramipril (ALTACE) 5 MG capsule Take 5 mg by mouth daily.   Yes Historical Provider, MD  saxagliptin HCl  (ONGLYZA) 2.5 MG TABS tablet Take 2.5 mg by mouth daily.   Yes Historical Provider, MD    Physical Exam: Filed Vitals:   12/18/12 1834 12/18/12 2045  BP: 154/61 141/66  Pulse: 75 78  Temp: 98.4 F (36.9 C)   TempSrc: Oral   Resp: 16 13  SpO2: 98% 100%    General: Alert, Awake and Oriented to Time, Place and Person. Appear in mild distress Eyes: PERRL ENT: Oral Mucosa clear dry. Neck: No JVD, no Carotid Bruits  Cardiovascular: S1 and S2 Present, no Murmur, Peripheral Pulses Present Respiratory: Bilateral Air entry equal and Decreased, minimal Crackles, no wheezes Abdomen: Bowel Sound Present, Soft and  Non tender Skin: No Rash Extremities: Left leg Pedal edema, no calf tenderness Neurologic: Grossly Unremarkable.  Labs on Admission:  CBC:  Recent Labs Lab 12/18/12 1843  WBC 12.3*  NEUTROABS 9.3*  HGB 10.1*  HCT 30.2*  MCV 78.2  PLT 277    CMP     Component Value Date/Time   NA 127* 12/18/2012 1843   K 6.0* 12/18/2012 1843   CL 90* 12/18/2012 1843   CO2 26 12/18/2012 1843   GLUCOSE 551* 12/18/2012 1843   BUN 35* 12/18/2012 1843   CREATININE 1.11* 12/18/2012 1843   CREATININE 0.83 02/21/2011 1712   CALCIUM 10.2 12/18/2012 1843   PROT 6.9 12/04/2012 1208   ALBUMIN 3.6 12/04/2012 1208   AST 12 12/04/2012 1208   ALT 11 12/04/2012 1208   ALKPHOS 145* 12/04/2012 1208   BILITOT 0.4 12/04/2012 1208   GFRNONAA 46* 12/18/2012 1843   GFRAA 54* 12/18/2012 1843    No results found for this basename: LIPASE, AMYLASE,  in the last 168 hours No results found for this basename: AMMONIA,  in the last 168 hours  Cardiac Enzymes: No results found for this basename: CKTOTAL, CKMB, CKMBINDEX, TROPONINI,  in the last 168 hours  BNP (last 3 results)  Recent Labs  06/10/12 1237  PROBNP 9026.0*    Radiological Exams on Admission: Dg Chest 2 View  12/18/2012   CLINICAL DATA:  Lethargy and hyperglycemia.  EXAM: CHEST  2 VIEW  COMPARISON:  Chest radiograph June 10, 2012  FINDINGS: Cardiac silhouette remains mild to moderately enlarged, status post median sternotomy for apparent coronary artery bypass grafting. Similar retrocardiac rounded masslike density. Small left pleural effusion. Minimal central pulmonary vasculature congestion, improved from prior examination. No pneumothorax.  Single lead left cardiac defibrillator in situ. Multiple EKG lines overlie the patient and may obscure subtle underlying pathology.  IMPRESSION: Stable cardiomegaly with mild, improved central pulmonary vasculature congestion.  Similar masslike retrocardiac density previously described as round atelectasis. Please see CT of chest June 07, 2012 for further description.  Electronically Signed   By: Awilda Metro   On: 12/18/2012 22:40   Assessment/Plan Principal Problem:   Hyperglycemia without ketosis Active Problems:   DIABETES MELLITUS, TYPE II   ANEMIA, IRON DEFICIENCY   Chronic systolic heart failure   CAROTID ARTERY DISEASE   Cellulitis   UTI (urinary tract infection)   1. Hyperglycemia without ketosis The patient is presenting with hyperglycemia, she does not have any acidosis or ketosis. She does not have any anion gap. With this I would admit her to telemetry unit with glucose stabilizer considering her acuity and age. I would also continue her on gentle IV hydration considering her history of CHF and current presentation with dehydration. She may require long-acting insulin at the time of the discharge and treatment education based on her current presentation. Plan culture has been sent by ED patient appears in the possible cellulitis with UTI and I would continue her on cephalosporin to cover both.  2. Chronic systolic heart failure At present the patient does not appear to be in any failure. Continue to monitor her due to requirement for IV hydration. Daily weight and strict is and os.  3. Chronic anemia The patient denies any active bleeding continue  monitoring H&H on daily basis.  DVT Prophylaxis: mechanical compression device Nutrition: N.p.o.  Code Status: Full  Family Communication: Family was present at bedside, opportunity was given to the family to ask question and all questions were answered satisfactorily at the time of interview. Disposition: Admitted to inpatient in telemetry.  Author: Lynden Oxford, MD [facility] Pager: [phone] 12/18/2012, 11:44 PM    If 7PM-7AM, please contact night-coverage [email or web address] Password [email or web address]

## 2012-12-18 NOTE — ED Provider Notes (Signed)
CSN: [id number]     Arrival date & time 12/18/12  1759 History   First MD Initiated Contact with Patient 12/18/12 2028     Chief Complaint  Patient presents with  . Hyperglycemia   (Consider location/radiation/quality/duration/timing/severity/associated sxs/prior Treatment) HPI Patient is a 79 year old female who presents to emergency department with complaint of generalized fatigue and elevated blood sugars. Patient states that she normally does not check her blood sugars. States that she has been feeling weak for approximately one to 2 weeks. Was seen by her doctor week ago and had blood work and urine checked. Was told she had anemia and elevated blood sugar. She was referred to a hematologist for this. She was also rechecked again 3 days ago at which time she was told her blood sugar was very high and she was started on additional diabetes medication name of which she does not remember. States she just got a new machine. States that she checked her blood sugar yesterday and today and it was over 500 both times. States that she is still feeling weak. States that she is having urinary frequency. She denies any fever, chills, upper respiratory symptoms, cough, abdominal pain. She does have chronic lower leg swelling left worse than right. States there is no change in. She states that she called her doctor about her sugar and they were told to come to emergency department.   Past Medical History  Diagnosis Date  . Diabetes mellitus   . Hyperlipidemia   . Hypertension   . CAD (coronary artery disease)     s/p stent R side 9/08, re stenosis 1/09, s/p angioplasty 1/09 and a stent on 12/09; re-angioplasty 5/11  . CHF (congestive heart failure)   . Diverticulitis 12/08  . 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 Barrett esophagus     EGD 08/2009, next 2013  . Carotid arterial disease     s/p stent R side 9-08, re stenosis 1-09, s/p angioplasty 1-09 and a stent on 12-09, last  angiogram 06-24-2009  .  Abnormal CT scan, chest     last 06-08-2009, see report   . ICD (implantable cardiac defibrillator) in place      12-08-- [employer]   . Stroke   . Collagen vascular disease   . GERD (gastroesophageal reflux disease)     occ  . Anemia     hx   Past Surgical History  Procedure Laterality Date  . Arterial bypass surgry      08/08/2000  LIMA to LAD, SVG to intermediate, SVG to OM2, SVG to distal RCA  . Cardiac defibrillator placement      st jud[employer]  . Amputation Left 06/08/2012    Procedure: AMPUTATION RAY;  Surgeon: Nadara Mustard, MD;  Location: [facility] OR;  Service: Orthopedics;  Laterality: Left;  Left Foot 1st Ray Amputation   Family History  Problem Relation Age of Onset  . Breast cancer Mother 56  . Colon cancer Neg Hx   . Tuberculosis Father    History  Substance Use Topics  . Smoking status: Never Smoker   . Smokeless tobacco: Never Used  . Alcohol Use: No   OB History   Grav Para Term Preterm Abortions TAB SAB Ect Mult Living                 Review of Systems  Constitutional: Positive for fatigue. Negative for fever and chills.  Respiratory: Negative for cough, chest tightness and shortness of breath.   Cardiovascular: Negative  for chest pain, palpitations and leg swelling.  Gastrointestinal: Negative for nausea, vomiting, abdominal pain and diarrhea.  Endocrine: Positive for polyuria.  Genitourinary: Positive for frequency. Negative for dysuria, flank pain, vaginal bleeding, vaginal discharge, vaginal pain and pelvic pain.  Musculoskeletal: Negative for arthralgias, myalgias, neck pain and neck stiffness.  Skin: Negative for rash.  Neurological: Positive for weakness. Negative for dizziness and headaches.  All other systems reviewed and are negative.    Allergies  Review of patient's allergies indicates no known allergies.  Home Medications   Current Outpatient Rx  Name  Route  Sig  Dispense  Refill  . aspirin 325 MG tablet   Oral   Take 325 mg  by mouth daily.          Marland Kitchen atorvastatin (LIPITOR) 40 MG tablet   Oral   Take 40 mg by mouth daily.         . clopidogrel (PLAVIX) 75 MG tablet   Oral   Take 75 mg by mouth daily.         . furosemide (LASIX) 40 MG tablet   Oral   Take 40 mg by mouth daily.          Marland Kitchen glimepiride (AMARYL) 4 MG tablet   Oral   Take 4 mg by mouth daily before breakfast.         . metFORMIN (GLUCOPHAGE) 1000 MG tablet   Oral   Take 1,000 mg by mouth 2 (two) times daily with a meal.         . metoprolol (LOPRESSOR) 50 MG tablet   Oral   Take 50 mg by mouth 2 (two) times daily.         . potassium chloride (K-DUR) 10 MEQ tablet   Oral   Take 10 mEq by mouth daily.         . ramipril (ALTACE) 5 MG capsule   Oral   Take 5 mg by mouth daily.         . saxagliptin HCl (ONGLYZA) 2.5 MG TABS tablet   Oral   Take 2.5 mg by mouth daily.          BP 141/66  Pulse 78  Temp(Src) 98.4 F (36.9 C) (Oral)  Resp 13  SpO2 100% Physical Exam  Nursing note and vitals reviewed. Constitutional: She is oriented to person, place, and time. She appears well-developed and well-nourished. No distress.  HENT:  Head: Normocephalic.  Eyes: Conjunctivae and EOM are normal. Pupils are equal, round, and reactive to light.  Neck: Neck supple.  Cardiovascular: Normal rate, regular rhythm and normal heart sounds.   Pulmonary/Chest: Effort normal and breath sounds normal. No respiratory distress. She has no wheezes. She has no rales.  Abdominal: Soft. Bowel sounds are normal. She exhibits no distension. There is no tenderness. There is no rebound.  Musculoskeletal: She exhibits no edema.  Neurological: She is alert and oriented to person, place, and time.  Skin: Skin is warm and dry.  Psychiatric: She has a normal mood and affect. Her behavior is normal.    ED Course  Procedures (including critical care time) Labs Review Labs Reviewed  CBC WITH DIFFERENTIAL - Abnormal; Notable for the  following:    WBC 12.3 (*)    RBC 3.86 (*)    Hemoglobin 10.1 (*)    HCT 30.2 (*)    Neutro Abs 9.3 (*)    All other components within normal limits  BASIC METABOLIC PANEL - Abnormal; Notable  for the following:    Sodium 127 (*)    Potassium 6.0 (*)    Chloride 90 (*)    Glucose, Bld 551 (*)    BUN 35 (*)    Creatinine, Ser 1.11 (*)    GFR calc non Af Amer 46 (*)    GFR calc Af Amer 54 (*)    All other components within normal limits  GLUCOSE, CAPILLARY - Abnormal; Notable for the following:    Glucose-Capillary 505 (*)    All other components within normal limits  GLUCOSE, CAPILLARY - Abnormal; Notable for the following:    Glucose-Capillary 494 (*)    All other components within normal limits  GLUCOSE, CAPILLARY - Abnormal; Notable for the following:    Glucose-Capillary 546 (*)    All other components within normal limits  URINALYSIS, ROUTINE W REFLEX MICROSCOPIC   Imaging Review Dg Chest 2 View  12/18/2012   CLINICAL DATA:  Lethargy and hyperglycemia.  EXAM: CHEST  2 VIEW  COMPARISON:  Chest radiograph June 10, 2012  FINDINGS: Cardiac silhouette remains mild to moderately enlarged, status post median sternotomy for apparent coronary artery bypass grafting. Similar retrocardiac rounded masslike density. Small left pleural effusion. Minimal central pulmonary vasculature congestion, improved from prior examination. No pneumothorax.  Single lead left cardiac defibrillator in situ. Multiple EKG lines overlie the patient and may obscure subtle underlying pathology.  IMPRESSION: Stable cardiomegaly with mild, improved central pulmonary vasculature congestion.  Similar masslike retrocardiac density previously described as round atelectasis. Please see CT of chest June 07, 2012 for further description.   Electronically Signed   By: Awilda Metro   On: 12/18/2012 22:40    EKG Interpretation     Ventricular Rate:    PR Interval:    QRS Duration:   QT Interval:    QTC  Calculation:   R Axis:     Text Interpretation:              MDM   1. Hyperglycemia   2. Hyperkalemia   3. UTI (lower urinary tract infection)     Patient in emergency department with generalized weakness, elevated blood sugars for last several days. In emergency department blood sugar was over 500. She does have extensive history including coronary disease and CHF. She does have an implantable cardiac defibrillator placed. She was given IV fluids slowly. She was given total of 1 L over the last 3 hours. She denies any shortness of breath or chest pain at this time. She was also started on glucose stabilize. Anion gap is 11. No signs of infection or cause for her sudden hyperglycemic identified at this time. She does have swelling of her left lower leg with mild erythema which could be do to cellulitis however patient and the family stated the leg has been like that for several months. [facility] her for further treatment.   Filed Vitals:   12/18/12 1834 12/18/12 2045 12/18/12 2345 12/19/12 0059  BP: 154/61 141/66 162/85 151/84  Pulse: 75 78 92 86  Temp: 98.4 F (36.9 C)   98 F (36.7 C)  TempSrc: Oral   Oral  Resp: 16 13 14 16   SpO2: 98% 100% 98% 98%     Zylie Mumaw A Kember Boch, PA-C 12/19/12 0214

## 2012-12-18 NOTE — Telephone Encounter (Signed)
Needs to go to the ER.

## 2012-12-18 NOTE — Telephone Encounter (Signed)
Received call from patients daughter. Patients blood glucose was 580 yesterday and 573 today. Patient does feel tired, but no other symptoms. Daughter would like to know what they should do. Please advise.

## 2012-12-18 NOTE — ED Notes (Signed)
Presents with hyperglycemia, started today over 500 symptoms include weakness and fatigue. Denies pain. Denies nausea, alert and oriented, MAE x4. Denies fevers. cbg here 505. Pt reports "I don't know what they usually run because I have not been taking them regularly. I just felt so bad that I thought I should take it today"

## 2012-12-19 ENCOUNTER — Telehealth: Payer: Self-pay | Admitting: Internal Medicine

## 2012-12-19 ENCOUNTER — Inpatient Hospital Stay (HOSPITAL_COMMUNITY): Payer: Medicare Other

## 2012-12-19 ENCOUNTER — Encounter (HOSPITAL_COMMUNITY): Payer: Self-pay | Admitting: *Deleted

## 2012-12-19 DIAGNOSIS — K8020 Calculus of gallbladder without cholecystitis without obstruction: Secondary | ICD-10-CM

## 2012-12-19 DIAGNOSIS — N390 Urinary tract infection, site not specified: Secondary | ICD-10-CM

## 2012-12-19 DIAGNOSIS — R748 Abnormal levels of other serum enzymes: Secondary | ICD-10-CM | POA: Diagnosis not present

## 2012-12-19 DIAGNOSIS — E119 Type 2 diabetes mellitus without complications: Secondary | ICD-10-CM

## 2012-12-19 LAB — CBC WITH DIFFERENTIAL/PLATELET
Basophils Relative: 0 % (ref 0–1)
Eosinophils Relative: 3 % (ref 0–5)
HCT: 27 % — ABNORMAL LOW (ref 36.0–46.0)
Hemoglobin: 9.1 g/dL — ABNORMAL LOW (ref 12.0–15.0)
Lymphs Abs: 2.3 10*3/uL (ref 0.7–4.0)
MCH: 26.4 pg (ref 26.0–34.0)
MCHC: 33.7 g/dL (ref 30.0–36.0)
MCV: 78.3 fL (ref 78.0–100.0)
Monocytes Absolute: 1.1 10*3/uL — ABNORMAL HIGH (ref 0.1–1.0)
Monocytes Relative: 9 % (ref 3–12)
Neutro Abs: 8 10*3/uL — ABNORMAL HIGH (ref 1.7–7.7)
Neutrophils Relative %: 68 % (ref 43–77)
RBC: 3.45 MIL/uL — ABNORMAL LOW (ref 3.87–5.11)

## 2012-12-19 LAB — GLUCOSE, CAPILLARY
Glucose-Capillary: 303 mg/dL — ABNORMAL HIGH (ref 70–99)
Glucose-Capillary: 97 mg/dL (ref 70–99)
Glucose-Capillary: 88 mg/dL (ref 70–99)
Glucose-Capillary: 217 mg/dL — ABNORMAL HIGH (ref 70–99)
Glucose-Capillary: 155 mg/dL — ABNORMAL HIGH (ref 70–99)
Glucose-Capillary: 183 mg/dL — ABNORMAL HIGH (ref 70–99)

## 2012-12-19 LAB — COMPREHENSIVE METABOLIC PANEL
Albumin: 3.1 g/dL — ABNORMAL LOW (ref 3.5–5.2)
Alkaline Phosphatase: 376 U/L — ABNORMAL HIGH (ref 39–117)
BUN: 29 mg/dL — ABNORMAL HIGH (ref 6–23)
Calcium: 9.2 mg/dL (ref 8.4–10.5)
Chloride: 101 mEq/L (ref 96–112)
Creatinine, Ser: 1 mg/dL (ref 0.50–1.10)
GFR calc Af Amer: 61 mL/min — ABNORMAL LOW (ref >90)
GFR calc non Af Amer: 53 mL/min — ABNORMAL LOW (ref >90)
Glucose, Bld: 97 mg/dL (ref 70–99)
Potassium: 4.2 mEq/L (ref 3.5–5.1)
Total Bilirubin: 0.2 mg/dL — ABNORMAL LOW (ref 0.3–1.2)
Total Protein: 6.6 g/dL (ref 6.0–8.3)

## 2012-12-19 LAB — PROTIME-INR
INR: 1.07 (ref 0.00–1.49)
Prothrombin Time: 13.7 seconds (ref 11.6–15.2)

## 2012-12-19 MED ORDER — INSULIN GLARGINE 100 UNIT/ML ~~LOC~~ SOLN
10.0000 [IU] | Freq: Every day | SUBCUTANEOUS | Status: DC
  Filled 2012-12-19: qty 0.1

## 2012-12-19 MED ORDER — CLOPIDOGREL BISULFATE 75 MG PO TABS
75.0000 mg | ORAL_TABLET | Freq: Every day | ORAL | Status: DC
  Administered 2012-12-19 – 2012-12-21 (×3): 75 mg via ORAL
  Filled 2012-12-19 (×4): qty 1

## 2012-12-19 MED ORDER — INSULIN PEN STARTER KIT
1.0000 | Freq: Once | Status: DC
  Filled 2012-12-19 (×2): qty 1

## 2012-12-19 MED ORDER — INFLUENZA VAC SPLIT QUAD 0.5 ML IM SUSP
0.5000 mL | INTRAMUSCULAR | Status: AC
  Administered 2012-12-20: 11:00:00 0.5 mL via INTRAMUSCULAR
  Filled 2012-12-19: qty 0.5

## 2012-12-19 MED ORDER — DEXTROSE 50 % IV SOLN: 25.0000 mL | INTRAVENOUS | Status: DC | PRN

## 2012-12-19 MED ORDER — CIPROFLOXACIN HCL 500 MG PO TABS: 250.0000 mg | ORAL_TABLET | Freq: Two times a day (BID) | ORAL | Status: DC

## 2012-12-19 MED ORDER — INSULIN ASPART 100 UNIT/ML ~~LOC~~ SOLN
0.0000 [IU] | Freq: Three times a day (TID) | SUBCUTANEOUS | Status: DC
  Administered 2012-12-19: 3 [IU] via SUBCUTANEOUS
  Administered 2012-12-19: 12:00:00 5 [IU] via SUBCUTANEOUS
  Administered 2012-12-20: 17:00:00 3 [IU] via SUBCUTANEOUS
  Administered 2012-12-20: 8 [IU] via SUBCUTANEOUS
  Administered 2012-12-20: 11 [IU] via SUBCUTANEOUS
  Administered 2012-12-21: 3 [IU] via SUBCUTANEOUS

## 2012-12-19 MED ORDER — WHITE PETROLATUM GEL
Status: AC
  Administered 2012-12-19: 0.2
  Filled 2012-12-19: qty 5

## 2012-12-19 MED ORDER — CYCLOBENZAPRINE HCL 5 MG PO TABS
5.0000 mg | ORAL_TABLET | Freq: Once | ORAL | Status: AC
  Administered 2012-12-19: 5 mg via ORAL
  Filled 2012-12-19: qty 1

## 2012-12-19 MED ORDER — HEPARIN SODIUM (PORCINE) 5000 UNIT/ML IJ SOLN
5000.0000 [IU] | Freq: Three times a day (TID) | INTRAMUSCULAR | Status: DC
  Administered 2012-12-19 – 2012-12-21 (×7): 5000 [IU] via SUBCUTANEOUS
  Filled 2012-12-19 (×10): qty 1

## 2012-12-19 MED ORDER — SODIUM CHLORIDE 0.9 % IV SOLN
INTRAVENOUS | Status: DC
  Administered 2012-12-19: 01:00:00 via INTRAVENOUS

## 2012-12-19 MED ORDER — INSULIN GLARGINE 100 UNIT/ML ~~LOC~~ SOLN
8.0000 [IU] | Freq: Every day | SUBCUTANEOUS | Status: DC
  Administered 2012-12-19: 06:00:00 8 [IU] via SUBCUTANEOUS
  Filled 2012-12-19: qty 0.08

## 2012-12-19 MED ORDER — ATORVASTATIN CALCIUM 40 MG PO TABS
40.0000 mg | ORAL_TABLET | Freq: Every day | ORAL | Status: DC
  Administered 2012-12-19 – 2012-12-21 (×3): 40 mg via ORAL
  Filled 2012-12-19 (×3): qty 1

## 2012-12-19 MED ORDER — INSULIN ASPART 100 UNIT/ML ~~LOC~~ SOLN
4.0000 [IU] | Freq: Three times a day (TID) | SUBCUTANEOUS | Status: DC
  Administered 2012-12-19 – 2012-12-20 (×3): 4 [IU] via SUBCUTANEOUS

## 2012-12-19 MED ORDER — METOPROLOL TARTRATE 50 MG PO TABS
50.0000 mg | ORAL_TABLET | Freq: Two times a day (BID) | ORAL | Status: DC
  Administered 2012-12-19 – 2012-12-21 (×6): 50 mg via ORAL
  Filled 2012-12-19 (×7): qty 1

## 2012-12-19 MED ORDER — INSULIN ASPART 100 UNIT/ML ~~LOC~~ SOLN: 0.0000 [IU] | Freq: Every day | SUBCUTANEOUS | Status: DC

## 2012-12-19 MED ORDER — LIVING WELL WITH DIABETES BOOK
Freq: Once | Status: AC
  Administered 2012-12-19: 14:00:00
  Filled 2012-12-19 (×2): qty 1

## 2012-12-19 MED ORDER — INSULIN REGULAR HUMAN 100 UNIT/ML IJ SOLN
INTRAMUSCULAR | Status: DC
  Filled 2012-12-19: qty 1

## 2012-12-19 MED ORDER — ASPIRIN 325 MG PO TABS
325.0000 mg | ORAL_TABLET | Freq: Every day | ORAL | Status: DC
  Administered 2012-12-19 – 2012-12-21 (×3): 325 mg via ORAL
  Filled 2012-12-19 (×3): qty 1

## 2012-12-19 NOTE — Progress Notes (Signed)
Patient watched patient education video 511. We entered room to go over instructions on how to use the pen and patient was on phone. Will re-attempt to teach insulin pen before shift change.

## 2012-12-19 NOTE — Progress Notes (Signed)
ANTIBIOTIC CONSULT NOTE - INITIAL  Pharmacy Consult for Ceftriaxone  Indication: UTI, cellulitis  No Known Allergies  Vital Signs: Temp: 98.4 F (36.9 C) (10/28 1834) Temp src: Oral (10/28 1834) BP: 162/85 mmHg (10/28 2345) Pulse Rate: 92 (10/28 2345)  Labs:  Recent Labs  12/18/12 1843  WBC 12.3*  HGB 10.1*  PLT 277  CREATININE 1.11*    Microbiology: Recent Results (from the past 720 hour(s))  URINE CULTURE     Status: None   Collection Time    11/20/12 12:15 PM      Result Value Range Status   Colony Count NO GROWTH   Final   Organism ID, Bacteria NO GROWTH   Final  URINE CULTURE     Status: None   Collection Time    12/04/12  4:47 PM      Result Value Range Status   Colony Count NO GROWTH   Final   Organism ID, Bacteria NO GROWTH   Final    Medical History: Past Medical History  Diagnosis Date  . Diabetes mellitus   . Hyperlipidemia   . Hypertension   . CAD (coronary artery disease)     s/p stent R side 9/08, re stenosis 1/09, s/p angioplasty 1/09 and a stent on 12/09; re-angioplasty 5/11  . CHF (congestive heart failure)   . Diverticulitis 12/08  . 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 Barrett esophagus     EGD 08/2009, next 2013  . Carotid arterial disease     s/p stent R side 9-08, re stenosis 1-09, s/p angioplasty 1-09 and a stent on 12-09, last  angiogram 06-24-2009  . Abnormal CT scan, chest     last 06-08-2009, see report   . ICD (implantable cardiac defibrillator) in place      12-08-- [employer]   . Stroke   . Collagen vascular disease   . GERD (gastroesophageal reflux disease)     occ  . Anemia     hx   Assessment: 79 y/o F here with hyperglycemia. To start ceftriaxone for possible UTI, also with some leg swelling/possible cellulitis. WBC 12.3, afebrile, Scr 1.11.   10/29 CTX>>  10/29 Urine >>  Goal of Therapy:  Clinical resolution   Plan:  -Ceftriaxone 1g IV q24h (ordered by MD) -Trend WBC, temp -F/U urine culture  Thank  you for allowing me to take part in this patient's care,  Abran Duke, PharmD Clinical Pharmacist Phone: [phone] Pager: [phone] 12/19/2012 12:25 AM

## 2012-12-19 NOTE — ED Provider Notes (Signed)
Medical screening examination/treatment/procedure(s) were conducted as a shared visit with non-physician practitioner(s) or resident and myself. I personally evaluated the patient during the encounter and agree with the findings and plan unless otherwise indicated.  I have personally reviewed any xrays and/ or EKG's with the provider and I agree with interpretation.  High glu, general weakness, gradually worsening. Decreased intake. No change in meds, no fevers.  Denies pain. CHF hx. Exam dry mm, general non focal weakness, no abd pain, mild erythema/ left LE edema.  Rocephin in ED.  Labs Reviewed   CBC WITH DIFFERENTIAL - Abnormal; Notable for the following:    WBC  12.3 (*)     RBC  3.86 (*)     Hemoglobin  10.1 (*)     HCT  30.2 (*)     Neutro Abs  9.3 (*)     All other components within normal limits   BASIC METABOLIC PANEL - Abnormal; Notable for the following:    Sodium  127 (*)     Potassium  6.0 (*)     Chloride  90 (*)     Glucose, Bld  551 (*)     BUN  35 (*)     Creatinine, Ser  1.11 (*)     GFR calc non Af Amer  46 (*)     GFR calc Af Amer  54 (*)     All other components within normal limits   GLUCOSE, CAPILLARY - Abnormal; Notable for the following:    Glucose-Capillary  505 (*)     All other components within normal limits   GLUCOSE, CAPILLARY - Abnormal; Notable for the following:    Glucose-Capillary  494 (*)     All other components within normal limits   GLUCOSE, CAPILLARY - Abnormal; Notable for the following:    Glucose-Capillary  546 (*)     All other components within normal limits   URINALYSIS, ROUTINE W REFLEX MICROSCOPIC   MAGNESIUM   POTASSIUM    Admission for hyperglycemia, dehydration, UTI, cellulitis, uncontrolled DM.    Enid Skeens, MD 12/19/12 [id number]

## 2012-12-19 NOTE — Progress Notes (Signed)
Spoke with patient and her daughter about diabetes and home regimen for diabetes control. Patient reports that she takes Amaryl 4mg  QAM, Metformin 1000 mg BID, and recently started on Onglyza 2.5 mg daily on 10/20.  Discussed A1C results (8.6% on 12/04/12) and explained what an A1C is, basic pathophysiology of DM Type 2, basic home care, importance of checking CBGs and maintaining good CBG control to prevent long-term and short-term complications. Patient reports that she was planning to check her blood sugar twice a day.  Asked patient if possible to check her blood sugar 4 times a day (before meals and at bedtime) and to keep a log of her blood glucose results to take with her to her follow up appointments with her PCP.  Discussed current regimen for inpatient glycemic control and explained Lantus and Novolog.  Also, discussed potential of patient being discharged on insulin and she states that she is willing to take insulin if it will improve her blood sugars.  Reviewed Living Well with Diabetes booklet with patient and her daughter.  Patient reports that she is very good about always taking her medications but she feels that the foods she is eating is having a negative impact on her blood sugar and she wants to learn as much as possible about a diabetic diet and carbohydrate counting.  RD has visited with patient and has provided written material on carbohydrate modified diabetic nutrition.  Informed patient about [facility] for outpatient diabetes education and patient requested that she be referred there.  Therefore placed order for outpatient diabetes education at [facility].  Have asked that patient watch videos on diabetes education and if patient is to be discharged on insulin to watch video on injections and the insulin pen.  If patient is to be discharged on Lantus would recommend Lantus pen (will need prescription for insulin pen, pen needles).  Reviewed signs and  symptoms of hyperglycemia and hypoglycemia along with treatment for both. RNs to provide ongoing basic DM education at bedside with this patient and engage patient to actively check blood glucose and administer insulin injections. Patient and her daughter thankful for consult and verbalized understanding of information discussed and state they do not have any questions at this time related to diabetes.  Will continue to follow while inpatient.  Thanks, Orlando Penner, RN, MSN, CCRN Diabetes Coordinator Inpatient Diabetes Program [phone] (Team Pager) [phone] (AP office) [phone] [facility] office)

## 2012-12-19 NOTE — Progress Notes (Signed)
Nutrition Brief Note  Patient identified on the Malnutrition Screening Tool (MST) Report. Pt reported that she was "unsure" if she had weight loss. RD reviewed weight hx and they have been relatively stable. RD corrected MST screening tool accordingly.  Pt is interested in DM education. RD completed during this visit.  Lab Results  Component Value Date   HGBA1C 8.6* 12/04/2012   RD provided "Carbohydrate Counting for People with Diabetes" handout from the Academy of [employer]etics. Discussed different food groups and their effects on blood sugar, emphasizing carbohydrate-containing foods. Provided list of carbohydrates and recommended serving sizes of common foods.  Discussed importance of controlled and consistent carbohydrate intake throughout the day. Provided examples of ways to balance meals/snacks and encouraged intake of high-fiber, whole grain complex carbohydrates. Teach back method used.  Expect fair compliance.  Body mass index is 30.19 kg/(m^2). Pt meets criteria for Obese Class I based on current BMI.  Current diet order is Carbohydrate Modified Medium, patient is consuming approximately 100% of meals at this time. Labs and medications reviewed. No further nutrition interventions warranted at this time. RD contact information provided. If additional nutrition issues arise, please re-consult RD.  Jarold Motto MS, RD, LDN Pager: [phone] After-hours pager: [phone]

## 2012-12-19 NOTE — Progress Notes (Signed)
[facility] PROGRESS NOTE  [name] [name] AOZ:[id number] DOB: Jun 26, 1932 DOA: 12/18/2012 PCP: [name], MD   Brief narrative 79 y/o female with DM, HTN, CAD systolic CHF, HTN admitted with elevated blood glucose without ketosis. Patient required insulin drip briefly.  Assessment/Plan: Uncontrolled diabetes mellitus Patient presented with blood glucose in 500s with hyponatremia and hyperkalemia. She is on 3 different oral hypoglycemics as outpatient. ( recently added onglyza). Hemoglobin A1c of 8.6. Patient started on insulin drip on admission her blood was improved this morning. Switched  to Lantus with sliding scale insulin. fsg in 200s. [name] provide insulin teaching and monitor her fsg overnight. She [name] likely be discharge on lantus insulin for better fsg control  UTI  urine cx pending. On emperic rocephin  CAD Continue aspirin and Plavix Continue metoprolol and Lipitor  Elevated alkaline phosphatase Incidental finding.  ultrasound of the liver shows  cholelithiasis with dilated CBD of 30 mm. Patient is completely asymptomatic. Given underlying diabetes mellitus she would need outpatient referral to general surgery.  Diastolic CHF Euvolemic   Code Status: full code Family Communication: none at bedside Disposition Plan: home ina m if FSG well controlled   Consultants:  none  Procedures:  none  Antibiotics:  rocephin  HPI/Subjective: Admission H&P reviewed. fsg improved with insulin drip.   Objective: Filed Vitals:   12/19/12 1001  BP: 108/67  Pulse: 89  Temp: 98.3 F (36.8 C)  Resp: 16    Intake/Output Summary (Last 24 hours) at 12/19/12 1621 Last data filed at 12/19/12 1258  Gross per 24 hour  Intake    240 ml  Output    751 ml  Net   -511 ml   Filed Weights   12/19/12 0059  Weight: 82.3 kg (181 lb 7 oz)    Exam:   General:  Elderly female in no distress  HEENT: No pallor, moist oral mucosa  Chest: Clear bilaterally, no added  sounds  Cardiovascular: Normal S1 and S2, no murmurs or gallop  Abdomen: , Nontender, nondistended, bowel sounds present, organomegaly  Musculoskeletal: Warm, no edema  CNS: AAO x3  Data Reviewed: Basic Metabolic Panel:  Recent Labs Lab 12/18/12 1843 12/18/12 2235 12/19/12 0545  NA 127*  --  137  K 6.0* 4.9 4.2  CL 90*  --  101  CO2 26  --  28  GLUCOSE 551*  --  97  BUN 35*  --  29*  CREATININE 1.11*  --  1.00  CALCIUM 10.2  --  9.2  MG  --  1.7  --    Liver Function Tests:  Recent Labs Lab 12/19/12 0545  AST 19  ALT 23  ALKPHOS 376*  BILITOT 0.2*  PROT 6.6  ALBUMIN 3.1*   No results found for this basename: LIPASE, AMYLASE,  in the last 168 hours No results found for this basename: AMMONIA,  in the last 168 hours CBC:  Recent Labs Lab 12/18/12 1843 12/19/12 0545  WBC 12.3* 11.8*  NEUTROABS 9.3* 8.0*  HGB 10.1* 9.1*  HCT 30.2* 27.0*  MCV 78.2 78.3  PLT 277 235   Cardiac Enzymes: No results found for this basename: CKTOTAL, CKMB, CKMBINDEX, TROPONINI,  in the last 168 hours BNP (last 3 results)  Recent Labs  06/10/12 1237  PROBNP 9026.0*   CBG:  Recent Labs Lab 12/19/12 0422 12/19/12 0527 12/19/12 0633 12/19/12 0735 12/19/12 1210  GLUCAP 97 88 105* 114* 217*    No results found for this or any previous visit (  from the past 240 hour(s)).   Studies: Dg Chest 2 View  12/18/2012   CLINICAL DATA:  Lethargy and hyperglycemia.  EXAM: CHEST  2 VIEW  COMPARISON:  Chest radiograph June 10, 2012  FINDINGS: Cardiac silhouette remains mild to moderately enlarged, status post median sternotomy for apparent coronary artery bypass grafting. Similar retrocardiac rounded masslike density. Small left pleural effusion. Minimal central pulmonary vasculature congestion, improved from prior examination. No pneumothorax.  Single lead left cardiac defibrillator in situ. Multiple EKG lines overlie the patient and may obscure subtle underlying pathology.   IMPRESSION: Stable cardiomegaly with mild, improved central pulmonary vasculature congestion.  Similar masslike retrocardiac density previously described as round atelectasis. Please see CT of chest June 07, 2012 for further description.   Electronically Signed   By: Awilda Metro   On: 12/18/2012 22:40   US Abdomen Complete  12/19/2012   CLINICAL DATA:  Elevated LFTs  EXAM: ULTRASOUND ABDOMEN COMPLETE  COMPARISON:  02/23/2007  FINDINGS: Gallbladder  Cholelithiasis (wall echo shadow sign). No gallbladder wall thickening or pericholecystic fluid. Negative sonographic Murphy's sign.  Common bile duct  Diameter: 13 mm (previously 12 mm in 2009). No choledocholithiasis is seen.  Liver  No focal lesion identified. Within normal limits in parenchymal echogenicity.  IVC  No abnormality visualized.  Pancreas  Visualized portion unremarkable.  Spleen  Measures 5.9 cm.  Right Kidney  Length: 12.3 cm. Echogenicity within normal limits. No mass or hydronephrosis visualized.  Left Kidney  Length: 11.2 cm. Echogenicity within normal limits. No mass or hydronephrosis visualized.  Abdominal aorta  No aneurysm visualized.  IMPRESSION: Cholelithiasis. No findings to suggest acute cholecystitis.  Dilated common duct, measuring 13 mm, chronic. No choledocholithiasis is seen.   Electronically Signed   By: Charline Bills [name]   On: 12/19/2012 15:33    Scheduled Meds: . aspirin  325 mg Oral Daily  . atorvastatin  40 mg Oral Daily  . cefTRIAXone (ROCEPHIN)  IV  1 g Intravenous Q24H  . clopidogrel  75 mg Oral Q breakfast  . heparin  5,000 Units Subcutaneous Q8H  . [START ON 12/20/2012] influenza vac split quadrivalent PF  0.5 mL Intramuscular Tomorrow-1000  . insulin aspart  0-15 Units Subcutaneous TID WC  . insulin aspart  0-5 Units Subcutaneous QHS  . insulin glargine  8 Units Subcutaneous QHS  . living well with diabetes book   Does not apply Once  . metoprolol  50 mg Oral BID   Continuous Infusions: . sodium  chloride Stopped (12/19/12 0430)      Time spent: 25 minutes    Sharesa Kemp  [facility] Pager [phone] If 7PM-7AM, please contact night-coverage at [email or web address], password [facility] 12/19/2012, 4:21 PM  LOS: 1 day

## 2012-12-19 NOTE — Progress Notes (Signed)
Inpatient Diabetes Program Recommendations  AACE/ADA: New Consensus Statement on Inpatient Glycemic Control (2013)  Target Ranges:  Prepandial:   less than 140 mg/dL      Peak postprandial:   less than 180 mg/dL (1-2 hours)      Critically ill patients:  140 - 180 mg/dL   Results for TAYLEIGH, WETHERELL (MRN [id number]) as of 12/19/2012 13:31  Ref. Range 12/04/2012 12:08  Hemoglobin A1C Latest Range: 4.6-6.5 % 8.6 (H)  Results for GIDGET, QUIZHPI (MRN [id number]) as of 12/19/2012 13:31  Ref. Range 12/18/2012 18:43  Glucose Latest Range: 70-99 mg/dL 782 [facility])   Results for EULALIE, SPEIGHTS (MRN [id number]) as of 12/19/2012 13:31  Ref. Range 12/18/2012 23:46 12/19/2012 01:07 12/19/2012 02:14 12/19/2012 03:10 12/19/2012 04:22 12/19/2012 05:27 12/19/2012 06:33 12/19/2012 07:35 12/19/2012 12:10  Glucose-Capillary Latest Range: 70-99 mg/dL 578 (H) 469 (H) 629 (H) 163 (H) 97 88 105 (H) 114 (H) 217 (H)   Inpatient Diabetes Program Recommendations Insulin - Basal: Please consider increasing Lantus to 10 units QHS. Insulin - Meal Coverage: Please consider ordering Novolog 4 units TID with meals for meal coverage while inpatient.  Note: Patient has a history of diabetes and takes Amaryl 4 mg QAM, Metformin 1000 mg BID, and Onglyza 2.5 mg daily (Onglyza recently started on 10/20 by Dr. Drue Novel) as an outpatient for diabetes management.  Currently, patient is ordered to receive Lantus 8 units QHS, Novolog 0-15 units AC, and Novolog 0-5 units HS for inpatient glycemic control.  Initial lab glucose was 551 mg/dl on 52/84/13 @ 24:40 and patient was started on an insulin drip and has now transitioned to SQ insulin.  Blood glucose was 105 mg/dl at 1:02 and 725 mg/dl at 3:66 when insulin drip was stopped.  Blood glucose was 217 mg/dl at 44:03.  Please consider increasing Lantus to 10 units QHS and ordering Novolog 4 units TID with meals for meal coverage while inpatient.  Plan to see patient this afternoon to discuss  diabetes control and provide diabetes education.  Will continue to follow while inpatient.  Thanks, Orlando Penner, RN, MSN, CCRN Diabetes Coordinator Inpatient Diabetes Program [phone] (Team Pager) [phone] (AP office) [phone] [facility] office)

## 2012-12-19 NOTE — Care Management Note (Addendum)
    Page 1 of 2   12/21/2012     12:16:12 PM   CARE MANAGEMENT NOTE 12/21/2012  Patient:  [name] [name], [name] [name]   Account Number:  [id number]  Date Initiated:  12/19/2012  Documentation initiated by:  Letha Cape  Subjective/Objective Assessment:   dx hyperglycemia, hyperkalemia  admit- lives with son. pta indep.     Action/Plan:   10/30- pt eval   Anticipated DC Date:  12/21/2012   Anticipated DC Plan:  HOME W HOME HEALTH SERVICES      DC Planning Services  CM consult      [facility] Choice  HOME HEALTH   Choice offered to / List presented to:  C-1 Patient        HH arranged  HH-2 PT  HH-3 OT      [facility] agency  [facility]   Status of service:  Completed, signed off Medicare Important Message given?   (If response is "NO", the following Medicare IM given date fields will be blank) Date Medicare IM given:   Date Additional Medicare IM given:    Discharge Disposition:  HOME/SELF CARE  Per UR Regulation:  Reviewed for med. necessity/level of care/duration of stay  If discussed at Long Length of Stay Meetings, dates discussed:    Comments:  12/21/12 11:06 Letha Cape RN, BSN [phone] patient is for dc to home today, referral called in to [place] for hhpt, Debbie notified , pt for dc today.  Soc will begin 24-48 hrs post discharge.  Patient wanted to add hhot, informed Debbie with [place].  12/20/12 12:44 Letha Cape RN, BSN [phone] patient lives with son, she states she is very weak today, MD ordered physical therapy to come see patient, await pt eval.  Patient states if she needs hhpt she would like to work with [place] and she would like to have the same therapist she had before , I informed her I would let them know if hhpt is recommeded.

## 2012-12-19 NOTE — Progress Notes (Signed)
Pt admitted to unit from ED. Pt is A&O, skin intact, & VS stable w/ a slightly elevated BP. Will give med for BP. Pt placed on telemetry, oriented to unit, and is currently resting comfortably in bed. Will continue to monitor.

## 2012-12-19 NOTE — Telephone Encounter (Signed)
Ricky from dr Myna Hidalgo called and wanted to know did [name] [name] still need that apt with them if she is in the hospital.    Dr Myna Hidalgo [phone]

## 2012-12-20 DIAGNOSIS — R7309 Other abnormal glucose: Secondary | ICD-10-CM

## 2012-12-20 DIAGNOSIS — N390 Urinary tract infection, site not specified: Secondary | ICD-10-CM | POA: Diagnosis not present

## 2012-12-20 LAB — GLUCOSE, CAPILLARY: Glucose-Capillary: 192 mg/dL — ABNORMAL HIGH (ref 70–99)

## 2012-12-20 LAB — URINE CULTURE: Colony Count: 50000

## 2012-12-20 MED ORDER — INSULIN ASPART 100 UNIT/ML ~~LOC~~ SOLN: 5.0000 [IU] | Freq: Three times a day (TID) | SUBCUTANEOUS | Status: DC

## 2012-12-20 MED ORDER — INSULIN ASPART 100 UNIT/ML ~~LOC~~ SOLN
6.0000 [IU] | Freq: Three times a day (TID) | SUBCUTANEOUS | Status: DC
  Administered 2012-12-20 – 2012-12-21 (×2): 6 [IU] via SUBCUTANEOUS

## 2012-12-20 MED ORDER — INSULIN GLARGINE 100 UNIT/ML ~~LOC~~ SOLN
10.0000 [IU] | Freq: Every day | SUBCUTANEOUS | Status: DC
  Administered 2012-12-20 – 2012-12-21 (×2): 10 [IU] via SUBCUTANEOUS
  Filled 2012-12-20 (×2): qty 0.1

## 2012-12-20 NOTE — Evaluation (Signed)
Physical Therapy Evaluation Patient Details Name: [name] [name] MRN: [id number] DOB: Oct 14, 1932 Today's Date: 12/20/2012 Time: 4098-1191 PT Time Calculation (min): 22 min  PT Assessment / Plan / Recommendation History of Present Illness  79 y/o female with DM, HTN, CAD systolic CHF, HTN admitted with elevated blood glucose without ketosis.  Clinical Impression  Pt presents to PT with weakness and decr mobility.  Pt and daughter both report this was worse this AM. This afternoon pt mobilizing with 1 person assist.  Daughter and pt concerned that pt may again be weaker in the AM tomorrow.  From PT standpoint pt could benefit from another day of therapy prior to DC. Question if AM weakness is related to blood sugar.    PT Assessment  Patient needs continued PT services    Follow Up Recommendations  Home health PT    Does the patient have the potential to tolerate intense rehabilitation      Barriers to Discharge        Equipment Recommendations  None recommended by PT    Recommendations for Other Services     Frequency Min 3X/week    Precautions / Restrictions Precautions Precautions: Fall   Pertinent Vitals/Pain No c/o's      Mobility  Bed Mobility Bed Mobility: Supine to Sit;Sitting - Scoot to Edge of Bed Supine to Sit: 5: Supervision;HOB elevated Sitting - Scoot to [place] of Bed: 5: Supervision Details for Bed Mobility Assistance: Incr time Transfers Transfers: Sit to Stand;Stand to Sit Sit to Stand: 4: Min assist;With upper extremity assist;From bed Stand to Sit: 4: Min assist;With upper extremity assist;To chair/3-in-1;With armrests Details for Transfer Assistance: assist to bring hips up. Ambulation/Gait Ambulation/Gait Assistance: 4: Min assist Ambulation Distance (Feet): 80 Feet Assistive device: Rolling walker Ambulation/Gait Assistance Details: Verbal cues to stand more upright. Assist to steady pt. Gait Pattern: Step-through pattern;Decreased step length  - right;Decreased step length - left;Trunk flexed Gait velocity: slow    Exercises     PT Diagnosis: Difficulty walking;Generalized weakness  PT Problem List: Decreased strength;Decreased activity tolerance;Decreased balance;Decreased mobility PT Treatment Interventions: DME instruction;Gait training;Functional mobility training;Balance training;Therapeutic activities;Therapeutic exercise;Patient/family education     PT Goals(Current goals can be found in the care plan section) Acute Rehab PT Goals Patient Stated Goal: Get stronger to go home. PT Goal Formulation: With patient/family Time For Goal Achievement: 12/22/12 Potential to Achieve Goals: Good  Visit Information  Last PT Received On: 12/20/12 Assistance Needed: +1 History of Present Illness: 79 y/o female with DM, HTN, CAD systolic CHF, HTN admitted with elevated blood glucose without ketosis.       Prior Functioning  Home Living Family/patient expects to be discharged to:: Private residence Living Arrangements: Children Available Help at Discharge: Family Type of Home: House Home Access: Stairs to enter Secretary/administrator of Steps: 3-4 Entrance Stairs-Rails: Right Home Layout: One level Home Equipment: Environmental consultant - 2 wheels Prior Function Level of Independence: Independent with assistive device(s) Communication Communication: No difficulties    Cognition  Cognition Arousal/Alertness: Awake/alert Behavior During Therapy: WFL for tasks assessed/performed Overall Cognitive Status: Within Functional Limits for tasks assessed    Extremity/Trunk Assessment Upper Extremity Assessment Upper Extremity Assessment: Generalized weakness Lower Extremity Assessment Lower Extremity Assessment: Generalized weakness   Balance Balance Balance Assessed: Yes Static Standing Balance Static Standing - Balance Support: Bilateral upper extremity supported Static Standing - Level of Assistance: 4: Min assist  End of Session PT  - End of Session Equipment Utilized During Treatment: Gait belt Activity  Tolerance: Patient tolerated treatment well Patient left: in chair;with call bell/phone within reach;with family/visitor present Nurse Communication: Mobility status  GP     Slate Debroux 12/20/2012, 1:49 PM  [facility] PT [phone]

## 2012-12-20 NOTE — Progress Notes (Signed)
Worked with patient and daughter on insulin pen demonstration.  Patient does not have her glasses here, so her vision was not the best.  Patient given BD insulin pen starter kit. Used demonstration pen to show patient steps for giving insulin.  Patient demonstrated back with some difficulty with vision. Daughter also demonstrated procedure without problems.  To watch DM video #511 on insulin pen.  Encouraged patient to administer own insulin this morning with nurse's assistance.  Patient complains of being very weak and resistant to going home today because of the weakness.  Spoke with staff RN about insulin pen education and to follow up on insulin administration.  Will continue to follow while in hospital.  Smith Mince RN BSN CDE

## 2012-12-20 NOTE — Progress Notes (Signed)
[facility] PROGRESS NOTE  [name] [name] ZOX:[id number] DOB: 1932/08/08 DOA: 12/18/2012 PCP: [name], MD  Brief narrative  79 y/o female with DM, HTN, CAD systolic CHF, HTN admitted with elevated blood glucose without ketosis. Patient required insulin drip briefly.   Assessment/Plan:  Uncontrolled diabetes mellitus  Patient presented with blood glucose in 500s with hyponatremia and hyperkalemia. She is on 3 different oral hypoglycemics as outpatient. ( recently added onglyza). Hemoglobin A1c of 8.6. Patient started on insulin drip on admission. . Switched to Lantus with sliding scale insulin. fsg in 200s to low 300s this am.  Needs insulin on discharge. Currently on 10 nits lantus and aspart  5 units tid with meals. Will d/c on lantus alone.  Patient feels quite weak today. Was unsteady on trying to go to bathroom this morning. Seen by PT . Recommends home with home health.   UTI  urine cx with mixed growth . On emperic rocephin. Will treat for 3 days.   CAD  Continue aspirin and Plavix  Continue metoprolol and Lipitor   Elevated alkaline phosphatase  Incidental finding. ultrasound of the liver shows cholelithiasis with dilated CBD of 30 mm. Patient is completely asymptomatic. Given underlying diabetes mellitus she would need outpatient referral to general surgery.   Diastolic CHF  Euvolemic   Code Status: full code  Family Communication: none at bedside  Disposition Plan: FSG still elevated. Patient weak and unsteady. Will monitor overnight and if stable d/c home with HHPT in am  Consultants:  none Procedures:  none Antibiotics:  Rocephin ( day2/3)  HPI/Subjective:  Feels weak and unsteady.    Objective: Filed Vitals:   12/20/12 1304  BP: 118/73  Pulse: 73  Temp: 98.3 F (36.8 C)  Resp: 18    Intake/Output Summary (Last 24 hours) at 12/20/12 1409 Last data filed at 12/20/12 0435  Gross per 24 hour  Intake      0 ml  Output    550 ml  Net   -550 ml    Filed Weights   12/19/12 0059 12/20/12 0508  Weight: 82.3 kg (181 lb 7 oz) 81.5 kg (179 lb 10.8 oz)    Exam: General: Elderly female in no distress  HEENT: No pallor, moist oral mucosa  Chest: Clear bilaterally, no added sounds  Cardiovascular: Normal S1 and S2, no murmurs or gallop Abdomen: , Nontender, nondistended, bowel sounds present, organomegaly  Musculoskeletal: Warm, no edema  CNS: AAO x3    Data Reviewed: Basic Metabolic Panel:  Recent Labs Lab 12/18/12 1843 12/18/12 2235 12/19/12 0545  NA 127*  --  137  K 6.0* 4.9 4.2  CL 90*  --  101  CO2 26  --  28  GLUCOSE 551*  --  97  BUN 35*  --  29*  CREATININE 1.11*  --  1.00  CALCIUM 10.2  --  9.2  MG  --  1.7  --    Liver Function Tests:  Recent Labs Lab 12/19/12 0545  AST 19  ALT 23  ALKPHOS 376*  BILITOT 0.2*  PROT 6.6  ALBUMIN 3.1*   No results found for this basename: LIPASE, AMYLASE,  in the last 168 hours No results found for this basename: AMMONIA,  in the last 168 hours CBC:  Recent Labs Lab 12/18/12 1843 12/19/12 0545  WBC 12.3* 11.8*  NEUTROABS 9.3* 8.0*  HGB 10.1* 9.1*  HCT 30.2* 27.0*  MCV 78.2 78.3  PLT 277 235   Cardiac Enzymes: No results found  for this basename: CKTOTAL, CKMB, CKMBINDEX, TROPONINI,  in the last 168 hours BNP (last 3 results)  Recent Labs  06/10/12 1237  PROBNP 9026.0*   CBG:  Recent Labs Lab 12/19/12 1210 12/19/12 1646 12/19/12 2201 12/20/12 0747 12/20/12 1130  GLUCAP 217* 155* 183* 304* 293*    Recent Results (from the past 240 hour(s))  URINE CULTURE     Status: None   Collection Time    12/18/12 10:08 PM      Result Value Range Status   Specimen Description URINE, CLEAN CATCH   Final   Special Requests NONE   Final   Culture  Setup Time     Final   Value: 12/19/2012 00:32     Performed at [facility] Count     Final   Value: 50,000 COLONIES/ML     Performed at [facility]   Culture     Final   Value:  Multiple bacterial morphotypes present, none predominant. Suggest appropriate recollection if clinically indicated.     Performed at [facility]   Report Status 12/20/2012 FINAL   Final     Studies: Dg Chest 2 View  12/18/2012   CLINICAL DATA:  Lethargy and hyperglycemia.  EXAM: CHEST  2 VIEW  COMPARISON:  Chest radiograph June 10, 2012  FINDINGS: Cardiac silhouette remains mild to moderately enlarged, status post median sternotomy for apparent coronary artery bypass grafting. Similar retrocardiac rounded masslike density. Small left pleural effusion. Minimal central pulmonary vasculature congestion, improved from prior examination. No pneumothorax.  Single lead left cardiac defibrillator in situ. Multiple EKG lines overlie the patient and may obscure subtle underlying pathology.  IMPRESSION: Stable cardiomegaly with mild, improved central pulmonary vasculature congestion.  Similar masslike retrocardiac density previously described as round atelectasis. Please see CT of chest June 07, 2012 for further description.   Electronically Signed   By: Awilda Metro   On: 12/18/2012 22:40   US Abdomen Complete  12/19/2012   CLINICAL DATA:  Elevated LFTs  EXAM: ULTRASOUND ABDOMEN COMPLETE  COMPARISON:  02/23/2007  FINDINGS: Gallbladder  Cholelithiasis (wall echo shadow sign). No gallbladder wall thickening or pericholecystic fluid. Negative sonographic Murphy's sign.  Common bile duct  Diameter: 13 mm (previously 12 mm in 2009). No choledocholithiasis is seen.  Liver  No focal lesion identified. Within normal limits in parenchymal echogenicity.  IVC  No abnormality visualized.  Pancreas  Visualized portion unremarkable.  Spleen  Measures 5.9 cm.  Right Kidney  Length: 12.3 cm. Echogenicity within normal limits. No mass or hydronephrosis visualized.  Left Kidney  Length: 11.2 cm. Echogenicity within normal limits. No mass or hydronephrosis visualized.  Abdominal aorta  No aneurysm visualized.   IMPRESSION: Cholelithiasis. No findings to suggest acute cholecystitis.  Dilated common duct, measuring 13 mm, chronic. No choledocholithiasis is seen.   Electronically Signed   By: Charline Bills [name]   On: 12/19/2012 15:33    Scheduled Meds: . aspirin  325 mg Oral Daily  . atorvastatin  40 mg Oral Daily  . cefTRIAXone (ROCEPHIN)  IV  1 g Intravenous Q24H  . clopidogrel  75 mg Oral Q breakfast  . heparin  5,000 Units Subcutaneous Q8H  . insulin aspart  0-15 Units Subcutaneous TID WC  . insulin aspart  0-5 Units Subcutaneous QHS  . insulin aspart  5 Units Subcutaneous TID WC  . insulin glargine  10 Units Subcutaneous Daily  . Insulin Pen Starter Kit  1 kit Other Once  .  metoprolol  50 mg Oral BID   Continuous Infusions: . sodium chloride Stopped (12/19/12 0430)      Time spent: 25 minutes    [name] [name]  [facility] Pager [phone]. If 7PM-7AM, please contact night-coverage at [email or web address], password [facility] 12/20/2012, 2:09 PM  LOS: 2 days

## 2012-12-21 DIAGNOSIS — K8020 Calculus of gallbladder without cholecystitis without obstruction: Secondary | ICD-10-CM | POA: Diagnosis not present

## 2012-12-21 DIAGNOSIS — R7309 Other abnormal glucose: Secondary | ICD-10-CM | POA: Diagnosis not present

## 2012-12-21 DIAGNOSIS — E119 Type 2 diabetes mellitus without complications: Secondary | ICD-10-CM | POA: Diagnosis not present

## 2012-12-21 DIAGNOSIS — N390 Urinary tract infection, site not specified: Secondary | ICD-10-CM | POA: Diagnosis not present

## 2012-12-21 LAB — GLUCOSE, CAPILLARY: Glucose-Capillary: 248 mg/dL — ABNORMAL HIGH (ref 70–99)

## 2012-12-21 MED ORDER — INSULIN GLARGINE 100 UNIT/ML SOLOSTAR PEN: 10.0000 [IU] | PEN_INJECTOR | Freq: Every day | SUBCUTANEOUS | Status: DC

## 2012-12-21 MED ORDER — INSULIN PEN STARTER KIT: 1.0000 | Freq: Once | Status: DC

## 2012-12-21 NOTE — Telephone Encounter (Signed)
No, will see after she is d/c home

## 2012-12-21 NOTE — Telephone Encounter (Signed)
Done. DJR  

## 2012-12-21 NOTE — Discharge Summary (Signed)
Physician Discharge Summary  [name] [name] MVH:[id number] DOB: 1932/11/18 DOA: 12/18/2012  PCP: [name], MD  Admit date: 12/18/2012 Discharge date: 12/21/2012  Time spent: 40 minutes  Recommendations for Outpatient Follow-up:  1. Home with home PT and outpt PCP follow up  Discharge Diagnoses:  Principal Problem:   Hyperglycemia without ketosis  Active Problems:   DIABETES MELLITUS, TYPE II   ANEMIA, IRON DEFICIENCY   Chronic systolic heart failure   UTI (urinary tract infection)   Elevated alkaline phosphatase level   Discharge Condition: fair  Diet recommendation: diabetic  Filed Weights   12/19/12 0059 12/20/12 0508 12/21/12 0451  Weight: 82.3 kg (181 lb 7 oz) 81.5 kg (179 lb 10.8 oz) 82.3 kg (181 lb 7 oz)    History of present illness:  Please refer to admission H&P for details, but in brief, 79 y/o female with DM, HTN, CAD systolic CHF, HTN admitted with elevated blood glucose without ketosis. Patient required insulin drip briefly.    Hospital Course:   Uncontrolled diabetes mellitus  Patient presented with blood glucose in 500s with hyponatremia and hyperkalemia. She is on 3 different oral hypoglycemics as outpatient. ( recently added onglyza). Hemoglobin A1c of 8.6. Patient started on insulin drip on admission. . Switched to Lantus with sliding scale insulin. fsg improving. Needs insulin on discharge. Currently on 10 units  lantus and aspart 5 units tid with meals. Will d/c on 10 unitis lantus along with her home oral hypoglycemics. .  Patient felt quite weak and  unsteady on trying to go to bathroom on 10/30. Seen by PT . Recommends home with home health.  She is advised to keep a log of her fsg ( am with fasting and random at bedtime and show it to her PCP during her follow up visit. Also instructed to call her PCP if fsg was consistently above 300s and greater than 400. Alerted on symptoms of hypoglycemia. -daughter has been taught on using insulin pen and  will administer her daily in the morning.  UTI  urine cx with mixed growth . On emperic rocephin.  treated for 3 days.   CAD  Continue aspirin and Plavix  Continue metoprolol and Lipitor   Elevated alkaline phosphatase  Incidental finding. ultrasound of the liver shows cholelithiasis with dilated CBD of 30 mm. Patient is completely asymptomatic. Given underlying diabetes mellitus she would need outpatient referral to general surgery.   Diastolic CHF  Euvolemic   Code Status: full code  Family Communication: daughter  at bedside  Disposition Plan:  home with HHPT   Consultants:  None   Procedures:  None   Antibiotics:  Rocephin    Discharge Exam: Filed Vitals:   12/21/12 0800  BP: 131/56  Pulse: 75  Temp: 98.1 F (36.7 C)  Resp: 18    General: Elderly female in no distress  HEENT: No pallor, moist oral mucosa  Chest: Clear bilaterally, no added sounds  Cardiovascular: Normal S1 and S2, no murmurs or gallop Abdomen: , Nontender, nondistended, bowel sounds present, no organomegaly  Musculoskeletal: Warm, no edema  CNS: AAO x3   Discharge Instructions  Discharge Orders   Future Orders Complete By Expires   Ambulatory referral to Nutrition and Diabetic Education  As directed    Comments:     Patient has a history of diabetes and has been on Metformin, Amaryl, and Onglyza (Onglyza was started on 12/10/12).  A1C was 8.6% on 12/04/2012.  Patient requests to have outpatient education on diabetes and  carb modified diabetic diet.  Best number to reach patient after she is discharged from the hospital is [phone].       Medication List         aspirin 325 MG tablet  Take 325 mg by mouth daily.     atorvastatin 40 MG tablet  Commonly known as:  LIPITOR  Take 40 mg by mouth daily.     clopidogrel 75 MG tablet  Commonly known as:  PLAVIX  Take 75 mg by mouth daily.     furosemide 40 MG tablet  Commonly known as:  LASIX  Take 40 mg by mouth daily.      glimepiride 4 MG tablet  Commonly known as:  AMARYL  Take 4 mg by mouth daily before breakfast.     Insulin Glargine 100 UNIT/ML Sopn  Commonly known as:  LANTUS SOLOSTAR  Inject 10 Units into the skin daily.     Insulin Pen Starter Kit Misc  1 kit by Other route once.     metFORMIN 1000 MG tablet  Commonly known as:  GLUCOPHAGE  Take 1,000 mg by mouth 2 (two) times daily with a meal.     metoprolol 50 MG tablet  Commonly known as:  LOPRESSOR  Take 50 mg by mouth 2 (two) times daily.     potassium chloride 10 MEQ tablet  Commonly known as:  K-DUR  Take 10 mEq by mouth daily.     ramipril 5 MG capsule  Commonly known as:  ALTACE  Take 5 mg by mouth daily.     saxagliptin HCl 2.5 MG Tabs tablet  Commonly known as:  ONGLYZA  Take 2.5 mg by mouth daily.       No Known Allergies     Follow-up Information   Follow up with [name], MD In 1 week.   Specialty:  Internal Medicine   Contact information:   [phone] [address] [facility] [address] [place] [address] [phone]        The results of significant diagnostics from this hospitalization (including imaging, microbiology, ancillary and laboratory) are listed below for reference.    Significant Diagnostic Studies: Dg Chest 2 View  12/18/2012   CLINICAL DATA:  Lethargy and hyperglycemia.  EXAM: CHEST  2 VIEW  COMPARISON:  Chest radiograph June 10, 2012  FINDINGS: Cardiac silhouette remains mild to moderately enlarged, status post median sternotomy for apparent coronary artery bypass grafting. Similar retrocardiac rounded masslike density. Small left pleural effusion. Minimal central pulmonary vasculature congestion, improved from prior examination. No pneumothorax.  Single lead left cardiac defibrillator in situ. Multiple EKG lines overlie the patient and may obscure subtle underlying pathology.  IMPRESSION: Stable cardiomegaly with mild, improved central pulmonary vasculature congestion.  Similar masslike  retrocardiac density previously described as round atelectasis. Please see CT of chest June 07, 2012 for further description.   Electronically Signed   By: Awilda Metro   On: 12/18/2012 22:40   US Abdomen Complete  12/19/2012   CLINICAL DATA:  Elevated LFTs  EXAM: ULTRASOUND ABDOMEN COMPLETE  COMPARISON:  02/23/2007  FINDINGS: Gallbladder  Cholelithiasis (wall echo shadow sign). No gallbladder wall thickening or pericholecystic fluid. Negative sonographic Murphy's sign.  Common bile duct  Diameter: 13 mm (previously 12 mm in 2009). No choledocholithiasis is seen.  Liver  No focal lesion identified. Within normal limits in parenchymal echogenicity.  IVC  No abnormality visualized.  Pancreas  Visualized portion unremarkable.  Spleen  Measures 5.9 cm.  Right  Kidney  Length: 12.3 cm. Echogenicity within normal limits. No mass or hydronephrosis visualized.  Left Kidney  Length: 11.2 cm. Echogenicity within normal limits. No mass or hydronephrosis visualized.  Abdominal aorta  No aneurysm visualized.  IMPRESSION: Cholelithiasis. No findings to suggest acute cholecystitis.  Dilated common duct, measuring 13 mm, chronic. No choledocholithiasis is seen.   Electronically Signed   By: Charline Bills [name]   On: 12/19/2012 15:33    Microbiology: Recent Results (from the past 240 hour(s))  URINE CULTURE     Status: None   Collection Time    12/18/12 10:08 PM      Result Value Range Status   Specimen Description URINE, CLEAN CATCH   Final   Special Requests NONE   Final   Culture  Setup Time     Final   Value: 12/19/2012 00:32     Performed at [facility] Count     Final   Value: 50,000 COLONIES/ML     Performed at [facility]   Culture     Final   Value: Multiple bacterial morphotypes present, none predominant. Suggest appropriate recollection if clinically indicated.     Performed at [facility]   Report Status 12/20/2012 FINAL   Final     Labs: Basic  Metabolic Panel:  Recent Labs Lab 12/18/12 1843 12/18/12 2235 12/19/12 0545  NA 127*  --  137  K 6.0* 4.9 4.2  CL 90*  --  101  CO2 26  --  28  GLUCOSE 551*  --  97  BUN 35*  --  29*  CREATININE 1.11*  --  1.00  CALCIUM 10.2  --  9.2  MG  --  1.7  --    Liver Function Tests:  Recent Labs Lab 12/19/12 0545  AST 19  ALT 23  ALKPHOS 376*  BILITOT 0.2*  PROT 6.6  ALBUMIN 3.1*   No results found for this basename: LIPASE, AMYLASE,  in the last 168 hours No results found for this basename: AMMONIA,  in the last 168 hours CBC:  Recent Labs Lab 12/18/12 1843 12/19/12 0545  WBC 12.3* 11.8*  NEUTROABS 9.3* 8.0*  HGB 10.1* 9.1*  HCT 30.2* 27.0*  MCV 78.2 78.3  PLT 277 235   Cardiac Enzymes: No results found for this basename: CKTOTAL, CKMB, CKMBINDEX, TROPONINI,  in the last 168 hours BNP: BNP (last 3 results)  Recent Labs  06/10/12 1237  PROBNP 9026.0*   CBG:  Recent Labs Lab 12/20/12 0747 12/20/12 1130 12/20/12 1714 12/20/12 2122 12/21/12 0806  GLUCAP 304* 293* 177* 192* 195*       Signed:  Doll Frazee  [facility] 12/21/2012, 10:52 AM

## 2012-12-21 NOTE — Progress Notes (Signed)
[name] [name] to be D/C'd Home with Home health PT/OT per MD order.  Discussed with the patient and all questions fully answered.    Medication List         aspirin 325 MG tablet  Take 325 mg by mouth daily.     atorvastatin 40 MG tablet  Commonly known as:  LIPITOR  Take 40 mg by mouth daily.     clopidogrel 75 MG tablet  Commonly known as:  PLAVIX  Take 75 mg by mouth daily.     furosemide 40 MG tablet  Commonly known as:  LASIX  Take 40 mg by mouth daily.     glimepiride 4 MG tablet  Commonly known as:  AMARYL  Take 4 mg by mouth daily before breakfast.     Insulin Glargine 100 UNIT/ML Sopn  Commonly known as:  LANTUS SOLOSTAR  Inject 10 Units into the skin daily.     Insulin Pen Starter Kit Misc  1 kit by Other route once.     metFORMIN 1000 MG tablet  Commonly known as:  GLUCOPHAGE  Take 1,000 mg by mouth 2 (two) times daily with a meal.     metoprolol 50 MG tablet  Commonly known as:  LOPRESSOR  Take 50 mg by mouth 2 (two) times daily.     potassium chloride 10 MEQ tablet  Commonly known as:  K-DUR  Take 10 mEq by mouth daily.     ramipril 5 MG capsule  Commonly known as:  ALTACE  Take 5 mg by mouth daily.     saxagliptin HCl 2.5 MG Tabs tablet  Commonly known as:  ONGLYZA  Take 2.5 mg by mouth daily.        VVS, Skin clean, dry and intact without evidence of skin break down, no evidence of skin tears noted. Some red areas on bilateral feet. IV catheter discontinued intact. Site without signs and symptoms of complications. Dressing and pressure applied.  An After Visit Summary was printed and given to the patient. Patient escorted via WC, and D/C home via private auto.  Driggers, Rae Roam 12/21/2012 12:18 PM

## 2012-12-24 ENCOUNTER — Telehealth: Payer: Self-pay | Admitting: Internal Medicine

## 2012-12-24 NOTE — Telephone Encounter (Signed)
Call patient, needs office visit in 7-10 days for a hospital followup,  sooner if problems, please schedule an appointment

## 2012-12-25 DIAGNOSIS — I2510 Atherosclerotic heart disease of native coronary artery without angina pectoris: Secondary | ICD-10-CM | POA: Diagnosis not present

## 2012-12-25 DIAGNOSIS — I5023 Acute on chronic systolic (congestive) heart failure: Secondary | ICD-10-CM | POA: Diagnosis not present

## 2012-12-25 DIAGNOSIS — E119 Type 2 diabetes mellitus without complications: Secondary | ICD-10-CM | POA: Diagnosis not present

## 2012-12-25 DIAGNOSIS — I10 Essential (primary) hypertension: Secondary | ICD-10-CM | POA: Diagnosis not present

## 2012-12-25 DIAGNOSIS — D649 Anemia, unspecified: Secondary | ICD-10-CM | POA: Diagnosis not present

## 2012-12-27 ENCOUNTER — Other Ambulatory Visit: Payer: Self-pay | Admitting: Internal Medicine

## 2012-12-27 DIAGNOSIS — E119 Type 2 diabetes mellitus without complications: Secondary | ICD-10-CM | POA: Diagnosis not present

## 2012-12-27 DIAGNOSIS — I5023 Acute on chronic systolic (congestive) heart failure: Secondary | ICD-10-CM | POA: Diagnosis not present

## 2012-12-27 DIAGNOSIS — I10 Essential (primary) hypertension: Secondary | ICD-10-CM | POA: Diagnosis not present

## 2012-12-27 DIAGNOSIS — I2510 Atherosclerotic heart disease of native coronary artery without angina pectoris: Secondary | ICD-10-CM | POA: Diagnosis not present

## 2012-12-27 DIAGNOSIS — D649 Anemia, unspecified: Secondary | ICD-10-CM | POA: Diagnosis not present

## 2012-12-27 NOTE — Telephone Encounter (Signed)
Patient needs to be call, see below

## 2012-12-27 NOTE — Telephone Encounter (Signed)
Potassium chloride refill sent to pharmacy 

## 2012-12-27 NOTE — Telephone Encounter (Signed)
lmovm \. DJR  

## 2012-12-28 ENCOUNTER — Telehealth: Payer: Self-pay | Admitting: *Deleted

## 2012-12-28 NOTE — Telephone Encounter (Signed)
Received phone call from [place] at [place] requesting [facility] RN to go out to patient's home to do teaching for glucose monitoring. Would this be acceptable? Home health has been doing PT with patient since her visit to the ER for hypoglycemia and UTI.   *call back number for Genevieve Norlander is [phone] Joyce Gross or [place])

## 2012-12-28 NOTE — Telephone Encounter (Signed)
Called and let Genevieve Norlander know that the order was approved.

## 2012-12-28 NOTE — Telephone Encounter (Signed)
Yes please . also, she needs a followup from recent hospital admission(unless she is homebound ) , see previous phone note

## 2012-12-31 ENCOUNTER — Encounter: Payer: Self-pay | Admitting: Internal Medicine

## 2012-12-31 ENCOUNTER — Ambulatory Visit (INDEPENDENT_AMBULATORY_CARE_PROVIDER_SITE_OTHER): Payer: Medicare Other | Admitting: Internal Medicine

## 2012-12-31 VITALS — BP 136/75 | HR 74 | Temp 98.3°F

## 2012-12-31 DIAGNOSIS — I5023 Acute on chronic systolic (congestive) heart failure: Secondary | ICD-10-CM | POA: Diagnosis not present

## 2012-12-31 DIAGNOSIS — I10 Essential (primary) hypertension: Secondary | ICD-10-CM | POA: Diagnosis not present

## 2012-12-31 DIAGNOSIS — Z23 Encounter for immunization: Secondary | ICD-10-CM

## 2012-12-31 DIAGNOSIS — K8020 Calculus of gallbladder without cholecystitis without obstruction: Secondary | ICD-10-CM

## 2012-12-31 DIAGNOSIS — I2510 Atherosclerotic heart disease of native coronary artery without angina pectoris: Secondary | ICD-10-CM | POA: Diagnosis not present

## 2012-12-31 DIAGNOSIS — D509 Iron deficiency anemia, unspecified: Secondary | ICD-10-CM

## 2012-12-31 DIAGNOSIS — E119 Type 2 diabetes mellitus without complications: Secondary | ICD-10-CM | POA: Diagnosis not present

## 2012-12-31 DIAGNOSIS — D649 Anemia, unspecified: Secondary | ICD-10-CM | POA: Diagnosis not present

## 2012-12-31 DIAGNOSIS — N390 Urinary tract infection, site not specified: Secondary | ICD-10-CM | POA: Diagnosis not present

## 2012-12-31 NOTE — Assessment & Plan Note (Signed)
BP today is satisfactory, last BMP okay, no change

## 2012-12-31 NOTE — Assessment & Plan Note (Signed)
Last urine culture negative

## 2012-12-31 NOTE — Assessment & Plan Note (Signed)
Based on CBC she was referred to hematology for IV iron, re refer

## 2012-12-31 NOTE — Progress Notes (Signed)
Pre visit review using our clinic review tool, if applicable. No additional management support is needed unless otherwise documented below in the visit note. 

## 2012-12-31 NOTE — Patient Instructions (Signed)
Next visit in  4 weeks for a   follow up (30 minutes) . No Fasting Please make an appointment     Glimepiride: Decreased from one tablet to half tablet a day Other medications are the same Check your blood sugars every morning, call me if they get less than 100. other sugars should be   less than 180

## 2012-12-31 NOTE — Assessment & Plan Note (Addendum)
Long  history of cholelithiasis, alkaline phosphatase noted to be elevated at recent hospital admission, ultrasound confirmed gallbladder stones. Advise patient to notify us if there is any abdominal pain nausea or vomiting. ER if symptoms severe. Given overall status, I don't think the patient is candidate  for elective surgery consequently we'll hold a  outpatient surgical referral at this point.

## 2012-12-31 NOTE — Progress Notes (Signed)
  Subjective:    Patient ID: [name] [name], female    DOB: [birth year], 79 y.o.   MRN: [id number]  HPI Hospital followup Admitted for 3 days, and discharged 12-21-12. Diabetes- Admitted with CBG in the 500s, hyponatremia, hyperkalemia; Started on insulin drip and eventually switched to Lantus . Was found to be quite weak, she was recommended home health physical therapy UTI--was treated empirically with Rocephin for 3 days but eventually UCX was neg Elevated alkaline phosphate--ultrasound showed cholelithiasis and a dilated CBD, patient was asymptomatic hospital is recommended outpatient referral to surgery Labs reviewed: Last BMP showed creatinine of 1.0, sodium 137. Alkaline phosphatase 376. Labs CBC show hemoglobin of 9.1, white count of 11.8   Review of Systems Since she left the hospital that she is feeling better. Hypertension, currently taking only one altace and daughter wonders if that is ok (it is), BPs are checked by physical therapy and are  within normal. Diabetes, in addition to her irregular medications they added Lantus 10 units, CBGs immediately after the hospital admission ~ 300ss, now in the 100s, this morning 117. Denies any fever, chills. No dysuria, abdominal pain, nausea or vomiting.     Objective:   Physical Exam  BP 136/75  Pulse 74  Temp(Src) 98.3 F (36.8 C)  SpO2 98% General -- alert, well-developed, NAD.   Lungs -- normal respiratory effort, no intercostal retractions, no accessory muscle use, and normal breath sounds.  Heart-- normal rate, regular rhythm, no murmur.  Abdomen-- Not distended, good bowel sounds,soft, non-tender. Extremities-- ++ edema L>R Neurologic--  alert & oriented X3. Speech normal. Psych--  . No anxious appearing , no depressed appearing.     Assessment & Plan:

## 2012-12-31 NOTE — Assessment & Plan Note (Addendum)
Recently admitted to the hospital with sugars in the 500s, getting better, she was added Lantus 10 units daily. Plan: Continue present except for amaryl, will decrease from 4 mg to 2 mg daily. See instructions. In the long run I prefer the patient to stay on a low dose of lanatus rather than amaryl

## 2013-01-01 DIAGNOSIS — I2510 Atherosclerotic heart disease of native coronary artery without angina pectoris: Secondary | ICD-10-CM | POA: Diagnosis not present

## 2013-01-01 DIAGNOSIS — I5023 Acute on chronic systolic (congestive) heart failure: Secondary | ICD-10-CM | POA: Diagnosis not present

## 2013-01-01 DIAGNOSIS — D649 Anemia, unspecified: Secondary | ICD-10-CM | POA: Diagnosis not present

## 2013-01-01 DIAGNOSIS — E119 Type 2 diabetes mellitus without complications: Secondary | ICD-10-CM | POA: Diagnosis not present

## 2013-01-01 DIAGNOSIS — I10 Essential (primary) hypertension: Secondary | ICD-10-CM | POA: Diagnosis not present

## 2013-01-02 DIAGNOSIS — E119 Type 2 diabetes mellitus without complications: Secondary | ICD-10-CM | POA: Diagnosis not present

## 2013-01-02 DIAGNOSIS — I10 Essential (primary) hypertension: Secondary | ICD-10-CM | POA: Diagnosis not present

## 2013-01-02 DIAGNOSIS — I2510 Atherosclerotic heart disease of native coronary artery without angina pectoris: Secondary | ICD-10-CM | POA: Diagnosis not present

## 2013-01-02 DIAGNOSIS — I5023 Acute on chronic systolic (congestive) heart failure: Secondary | ICD-10-CM | POA: Diagnosis not present

## 2013-01-02 DIAGNOSIS — D649 Anemia, unspecified: Secondary | ICD-10-CM | POA: Diagnosis not present

## 2013-01-03 DIAGNOSIS — I2510 Atherosclerotic heart disease of native coronary artery without angina pectoris: Secondary | ICD-10-CM | POA: Diagnosis not present

## 2013-01-03 DIAGNOSIS — L97209 Non-pressure chronic ulcer of unspecified calf with unspecified severity: Secondary | ICD-10-CM | POA: Diagnosis not present

## 2013-01-03 DIAGNOSIS — I5023 Acute on chronic systolic (congestive) heart failure: Secondary | ICD-10-CM | POA: Diagnosis not present

## 2013-01-03 DIAGNOSIS — I83219 Varicose veins of right lower extremity with both ulcer of unspecified site and inflammation: Secondary | ICD-10-CM | POA: Diagnosis not present

## 2013-01-03 DIAGNOSIS — I10 Essential (primary) hypertension: Secondary | ICD-10-CM | POA: Diagnosis not present

## 2013-01-03 DIAGNOSIS — E119 Type 2 diabetes mellitus without complications: Secondary | ICD-10-CM | POA: Diagnosis not present

## 2013-01-03 DIAGNOSIS — E1149 Type 2 diabetes mellitus with other diabetic neurological complication: Secondary | ICD-10-CM | POA: Diagnosis not present

## 2013-01-03 DIAGNOSIS — D649 Anemia, unspecified: Secondary | ICD-10-CM | POA: Diagnosis not present

## 2013-01-04 DIAGNOSIS — I5023 Acute on chronic systolic (congestive) heart failure: Secondary | ICD-10-CM | POA: Diagnosis not present

## 2013-01-04 DIAGNOSIS — I2510 Atherosclerotic heart disease of native coronary artery without angina pectoris: Secondary | ICD-10-CM | POA: Diagnosis not present

## 2013-01-04 DIAGNOSIS — E119 Type 2 diabetes mellitus without complications: Secondary | ICD-10-CM | POA: Diagnosis not present

## 2013-01-04 DIAGNOSIS — D649 Anemia, unspecified: Secondary | ICD-10-CM | POA: Diagnosis not present

## 2013-01-04 DIAGNOSIS — I10 Essential (primary) hypertension: Secondary | ICD-10-CM | POA: Diagnosis not present

## 2013-01-07 DIAGNOSIS — I5023 Acute on chronic systolic (congestive) heart failure: Secondary | ICD-10-CM | POA: Diagnosis not present

## 2013-01-07 DIAGNOSIS — I2510 Atherosclerotic heart disease of native coronary artery without angina pectoris: Secondary | ICD-10-CM | POA: Diagnosis not present

## 2013-01-07 DIAGNOSIS — D649 Anemia, unspecified: Secondary | ICD-10-CM | POA: Diagnosis not present

## 2013-01-07 DIAGNOSIS — E119 Type 2 diabetes mellitus without complications: Secondary | ICD-10-CM | POA: Diagnosis not present

## 2013-01-07 DIAGNOSIS — I10 Essential (primary) hypertension: Secondary | ICD-10-CM | POA: Diagnosis not present

## 2013-01-07 DIAGNOSIS — Z0279 Encounter for issue of other medical certificate: Secondary | ICD-10-CM

## 2013-01-08 DIAGNOSIS — I2510 Atherosclerotic heart disease of native coronary artery without angina pectoris: Secondary | ICD-10-CM | POA: Diagnosis not present

## 2013-01-08 DIAGNOSIS — E119 Type 2 diabetes mellitus without complications: Secondary | ICD-10-CM | POA: Diagnosis not present

## 2013-01-08 DIAGNOSIS — I5023 Acute on chronic systolic (congestive) heart failure: Secondary | ICD-10-CM | POA: Diagnosis not present

## 2013-01-08 DIAGNOSIS — D649 Anemia, unspecified: Secondary | ICD-10-CM | POA: Diagnosis not present

## 2013-01-08 DIAGNOSIS — I10 Essential (primary) hypertension: Secondary | ICD-10-CM | POA: Diagnosis not present

## 2013-01-09 DIAGNOSIS — I10 Essential (primary) hypertension: Secondary | ICD-10-CM | POA: Diagnosis not present

## 2013-01-09 DIAGNOSIS — I2510 Atherosclerotic heart disease of native coronary artery without angina pectoris: Secondary | ICD-10-CM | POA: Diagnosis not present

## 2013-01-09 DIAGNOSIS — D649 Anemia, unspecified: Secondary | ICD-10-CM | POA: Diagnosis not present

## 2013-01-09 DIAGNOSIS — I5023 Acute on chronic systolic (congestive) heart failure: Secondary | ICD-10-CM | POA: Diagnosis not present

## 2013-01-09 DIAGNOSIS — E119 Type 2 diabetes mellitus without complications: Secondary | ICD-10-CM | POA: Diagnosis not present

## 2013-01-10 DIAGNOSIS — I2510 Atherosclerotic heart disease of native coronary artery without angina pectoris: Secondary | ICD-10-CM | POA: Diagnosis not present

## 2013-01-10 DIAGNOSIS — L97209 Non-pressure chronic ulcer of unspecified calf with unspecified severity: Secondary | ICD-10-CM | POA: Diagnosis not present

## 2013-01-10 DIAGNOSIS — D649 Anemia, unspecified: Secondary | ICD-10-CM | POA: Diagnosis not present

## 2013-01-10 DIAGNOSIS — E119 Type 2 diabetes mellitus without complications: Secondary | ICD-10-CM | POA: Diagnosis not present

## 2013-01-10 DIAGNOSIS — E1149 Type 2 diabetes mellitus with other diabetic neurological complication: Secondary | ICD-10-CM | POA: Diagnosis not present

## 2013-01-10 DIAGNOSIS — I5023 Acute on chronic systolic (congestive) heart failure: Secondary | ICD-10-CM | POA: Diagnosis not present

## 2013-01-10 DIAGNOSIS — I83219 Varicose veins of right lower extremity with both ulcer of unspecified site and inflammation: Secondary | ICD-10-CM | POA: Diagnosis not present

## 2013-01-10 DIAGNOSIS — I10 Essential (primary) hypertension: Secondary | ICD-10-CM | POA: Diagnosis not present

## 2013-01-11 DIAGNOSIS — I5023 Acute on chronic systolic (congestive) heart failure: Secondary | ICD-10-CM | POA: Diagnosis not present

## 2013-01-11 DIAGNOSIS — I10 Essential (primary) hypertension: Secondary | ICD-10-CM | POA: Diagnosis not present

## 2013-01-11 DIAGNOSIS — D649 Anemia, unspecified: Secondary | ICD-10-CM | POA: Diagnosis not present

## 2013-01-11 DIAGNOSIS — I2510 Atherosclerotic heart disease of native coronary artery without angina pectoris: Secondary | ICD-10-CM | POA: Diagnosis not present

## 2013-01-11 DIAGNOSIS — E119 Type 2 diabetes mellitus without complications: Secondary | ICD-10-CM | POA: Diagnosis not present

## 2013-01-14 DIAGNOSIS — I2510 Atherosclerotic heart disease of native coronary artery without angina pectoris: Secondary | ICD-10-CM | POA: Diagnosis not present

## 2013-01-14 DIAGNOSIS — D649 Anemia, unspecified: Secondary | ICD-10-CM | POA: Diagnosis not present

## 2013-01-14 DIAGNOSIS — E119 Type 2 diabetes mellitus without complications: Secondary | ICD-10-CM | POA: Diagnosis not present

## 2013-01-14 DIAGNOSIS — I5023 Acute on chronic systolic (congestive) heart failure: Secondary | ICD-10-CM | POA: Diagnosis not present

## 2013-01-14 DIAGNOSIS — I10 Essential (primary) hypertension: Secondary | ICD-10-CM | POA: Diagnosis not present

## 2013-01-16 DIAGNOSIS — I2510 Atherosclerotic heart disease of native coronary artery without angina pectoris: Secondary | ICD-10-CM | POA: Diagnosis not present

## 2013-01-16 DIAGNOSIS — I10 Essential (primary) hypertension: Secondary | ICD-10-CM | POA: Diagnosis not present

## 2013-01-16 DIAGNOSIS — I5023 Acute on chronic systolic (congestive) heart failure: Secondary | ICD-10-CM | POA: Diagnosis not present

## 2013-01-16 DIAGNOSIS — E119 Type 2 diabetes mellitus without complications: Secondary | ICD-10-CM | POA: Diagnosis not present

## 2013-01-16 DIAGNOSIS — D649 Anemia, unspecified: Secondary | ICD-10-CM | POA: Diagnosis not present

## 2013-01-21 DIAGNOSIS — I5023 Acute on chronic systolic (congestive) heart failure: Secondary | ICD-10-CM | POA: Diagnosis not present

## 2013-01-21 DIAGNOSIS — D649 Anemia, unspecified: Secondary | ICD-10-CM | POA: Diagnosis not present

## 2013-01-21 DIAGNOSIS — I10 Essential (primary) hypertension: Secondary | ICD-10-CM | POA: Diagnosis not present

## 2013-01-21 DIAGNOSIS — I2510 Atherosclerotic heart disease of native coronary artery without angina pectoris: Secondary | ICD-10-CM | POA: Diagnosis not present

## 2013-01-21 DIAGNOSIS — E119 Type 2 diabetes mellitus without complications: Secondary | ICD-10-CM | POA: Diagnosis not present

## 2013-01-23 DIAGNOSIS — I10 Essential (primary) hypertension: Secondary | ICD-10-CM | POA: Diagnosis not present

## 2013-01-23 DIAGNOSIS — E119 Type 2 diabetes mellitus without complications: Secondary | ICD-10-CM | POA: Diagnosis not present

## 2013-01-23 DIAGNOSIS — I5023 Acute on chronic systolic (congestive) heart failure: Secondary | ICD-10-CM | POA: Diagnosis not present

## 2013-01-23 DIAGNOSIS — I2510 Atherosclerotic heart disease of native coronary artery without angina pectoris: Secondary | ICD-10-CM | POA: Diagnosis not present

## 2013-01-23 DIAGNOSIS — D649 Anemia, unspecified: Secondary | ICD-10-CM | POA: Diagnosis not present

## 2013-01-25 DIAGNOSIS — L97209 Non-pressure chronic ulcer of unspecified calf with unspecified severity: Secondary | ICD-10-CM | POA: Diagnosis not present

## 2013-01-25 DIAGNOSIS — E1149 Type 2 diabetes mellitus with other diabetic neurological complication: Secondary | ICD-10-CM | POA: Diagnosis not present

## 2013-01-25 DIAGNOSIS — I83219 Varicose veins of right lower extremity with both ulcer of unspecified site and inflammation: Secondary | ICD-10-CM | POA: Diagnosis not present

## 2013-01-28 DIAGNOSIS — I5023 Acute on chronic systolic (congestive) heart failure: Secondary | ICD-10-CM | POA: Diagnosis not present

## 2013-01-28 DIAGNOSIS — I2510 Atherosclerotic heart disease of native coronary artery without angina pectoris: Secondary | ICD-10-CM | POA: Diagnosis not present

## 2013-01-28 DIAGNOSIS — E119 Type 2 diabetes mellitus without complications: Secondary | ICD-10-CM | POA: Diagnosis not present

## 2013-01-28 DIAGNOSIS — D649 Anemia, unspecified: Secondary | ICD-10-CM | POA: Diagnosis not present

## 2013-01-28 DIAGNOSIS — I10 Essential (primary) hypertension: Secondary | ICD-10-CM | POA: Diagnosis not present

## 2013-01-29 DIAGNOSIS — I10 Essential (primary) hypertension: Secondary | ICD-10-CM | POA: Diagnosis not present

## 2013-01-29 DIAGNOSIS — D649 Anemia, unspecified: Secondary | ICD-10-CM | POA: Diagnosis not present

## 2013-01-29 DIAGNOSIS — I5023 Acute on chronic systolic (congestive) heart failure: Secondary | ICD-10-CM | POA: Diagnosis not present

## 2013-01-29 DIAGNOSIS — E119 Type 2 diabetes mellitus without complications: Secondary | ICD-10-CM | POA: Diagnosis not present

## 2013-01-29 DIAGNOSIS — I2510 Atherosclerotic heart disease of native coronary artery without angina pectoris: Secondary | ICD-10-CM | POA: Diagnosis not present

## 2013-01-30 DIAGNOSIS — I10 Essential (primary) hypertension: Secondary | ICD-10-CM | POA: Diagnosis not present

## 2013-01-30 DIAGNOSIS — I5023 Acute on chronic systolic (congestive) heart failure: Secondary | ICD-10-CM | POA: Diagnosis not present

## 2013-01-30 DIAGNOSIS — D649 Anemia, unspecified: Secondary | ICD-10-CM | POA: Diagnosis not present

## 2013-01-30 DIAGNOSIS — I2510 Atherosclerotic heart disease of native coronary artery without angina pectoris: Secondary | ICD-10-CM | POA: Diagnosis not present

## 2013-01-30 DIAGNOSIS — E119 Type 2 diabetes mellitus without complications: Secondary | ICD-10-CM | POA: Diagnosis not present

## 2013-01-31 ENCOUNTER — Telehealth: Payer: Self-pay | Admitting: Hematology & Oncology

## 2013-01-31 NOTE — Telephone Encounter (Signed)
Left vm w NEW PATIENT today to remind them of their appointment with Dr. Ennever. Also, advised them to bring all meds and insurance information. ° °

## 2013-02-01 ENCOUNTER — Other Ambulatory Visit (HOSPITAL_BASED_OUTPATIENT_CLINIC_OR_DEPARTMENT_OTHER): Payer: Medicare Other | Admitting: Lab

## 2013-02-01 ENCOUNTER — Ambulatory Visit: Payer: Medicare Other

## 2013-02-01 ENCOUNTER — Ambulatory Visit (HOSPITAL_BASED_OUTPATIENT_CLINIC_OR_DEPARTMENT_OTHER): Payer: Medicare Other | Admitting: Hematology & Oncology

## 2013-02-01 VITALS — BP 164/53 | HR 69 | Temp 98.3°F | Resp 14 | Ht 65.0 in | Wt 183.0 lb

## 2013-02-01 DIAGNOSIS — D649 Anemia, unspecified: Secondary | ICD-10-CM

## 2013-02-01 DIAGNOSIS — D509 Iron deficiency anemia, unspecified: Secondary | ICD-10-CM | POA: Diagnosis not present

## 2013-02-01 LAB — CBC WITH DIFFERENTIAL (CANCER CENTER ONLY)
BASO#: 0 10*3/uL (ref 0.0–0.2)
BASO%: 0.2 % (ref 0.0–2.0)
Eosinophils Absolute: 0.2 10*3/uL (ref 0.0–0.5)
HCT: 29.3 % — ABNORMAL LOW (ref 34.8–46.6)
HGB: 9.1 g/dL — ABNORMAL LOW (ref 11.6–15.9)
LYMPH#: 3.5 10*3/uL — ABNORMAL HIGH (ref 0.9–3.3)
MONO#: 0.7 10*3/uL (ref 0.1–0.9)
MONO%: 5.5 % (ref 0.0–13.0)
NEUT%: 63.8 % (ref 39.6–80.0)
Platelets: 222 10*3/uL (ref 145–400)
RBC: 3.49 10*6/uL — ABNORMAL LOW (ref 3.70–5.32)
WBC: 12.1 10*3/uL — ABNORMAL HIGH (ref 3.9–10.0)

## 2013-02-01 LAB — CHCC SATELLITE - SMEAR

## 2013-02-01 NOTE — Progress Notes (Signed)
This office note has been dictated.

## 2013-02-02 NOTE — Progress Notes (Signed)
CC:   [name] Ora, MD  DIAGNOSES: 1. Microcytic anemia. 2. Leukocytosis.  HISTORY OF PRESENT ILLNESS:  [name] [name] is a very charming 79 year old Caucasian female.  She has a longstanding history of insulin-dependent diabetes.  She had, I think, the big toe amputated from her left foot back in April.  She has been noted to have anemia.  She has had, I think, transfusions. She had no obvious bleeding.  She has been seen by Dr. Lina Sar of Gastroenterology.  Dr. Juanda Chance, I think, he has done upper endoscopy which was negative for any obvious bleeding.  [name] [name] is on Plavix.  Apparently, looking back through her records, she had a CBC done back in April.  Her white cell count is 11, hemoglobin 6.9, hematocrit 20.8, and platelet count was 310,000.  MCV was 77.  At that point in time, she had to be transfused.  I think this is probably when she got her toe amputated.  In September of this year, white cell count was 13.8, hemoglobin 9.1, and platelet count was 252.  She had normal white cell differential. MCV was 79.  In October, her white cell count was 11.8, hemoglobin 9.1, hematocrit 27, platelet count was 235.  MCV was 78.  Again, she has had no obvious bleeding.  Electrolytes that have been done have shown some mild renal insufficiency.  Her BUN and creatinine in October were 29 and 1.0. Total protein was 6.6 with an albumin of 3.1.  Iron studies that were done, I think, back in April, showed a ferritin of 76, but iron saturation of only 7.  I suspect the ferritin elevation was probably related to infection.  She was kindly referred to the [facility] for an evaluation.  She has no "cravings."  She is not a vegetarian.  She has a normal B12 and folate level.  She is on Plavix for a coronary bypass which was, I think, back in 2001 or so.  A CEA was done back in 2011, this was 3.6.  She is in a wheelchair.  She does get around with some  assistance.  She has had no change in bowel or bladder habits.  She has had no rashes. She has had no cough.  She does get fatigued.  A CT scan of the chest done back in April showed some atelectasis in the left lower lobe.  There was no obvious malignancy noted.  Other portions of the abdomen showed some gallstones.  An abdominal ultrasound done in October of this year showed gallstones.  Again, she was kindly referred to the [facility] for an evaluation.  PAST MEDICAL HISTORY:  Remarkable for, 1. Insulin-dependent diabetes. 2. Hyperlipidemia. 3. Hypertension. 4. Coronary artery disease, status post bypass. 5. GERD. 6. Chololithiasis. 7. Implantable cardiac defibrillator.  ALLERGIES:  I think, none.  MEDICATIONS:  Aspirin 325 mg p.o. daily, Lipitor 40 mg p.o. daily, Plavix 75 mg p.o. daily, Lasix 40 mg p.o. daily, Amaryl 2 mg p.o. q.h.s., Lantus insulin 10 units daily subcu, Glucophage 1000 mg p.o. b.i.d., Lopressor 50 mg p.o. b.i.d., ramipril 5 mg p.o. daily, potassium 10 mEq p.o. daily.  SOCIAL HISTORY:  Negative for tobacco use.  There is no alcohol use. She has no obvious occupational exposures.  FAMILY HISTORY:  Remarkable for mother had breast cancer.  There is no obvious anemia in the family.  REVIEW OF SYSTEMS:  As stated in the history of present illness.  No additional findings noted  on a 12-system review.  PHYSICAL EXAMINATION:  General:  This is an elderly petite white female in no obvious distress.  Vital Signs:  Temperature of 98.3, pulse 69, respiratory rate 14, blood pressure 164/53, weight is 183 pounds.  Head and Neck:  Shows a normocephalic, atraumatic skull.  She has no scleral icterus.  There is no oral lesions.  There is no adenopathy in her neck. Lungs:  Clear to percussion and auscultation bilaterally.  Cardiac: Regular rate and rhythm with a normal S1, S2.  She may have an occasional extra beat.  She has a 1/6 systolic  ejection murmur. Abdomen:  Soft.  She has good bowel sounds.  There is no fluid wave. There is no palpable abdominal mass.  There is no palpable hepatosplenomegaly.  Back:  Shows some slight kyphosis.  She has no tenderness over the spine, ribs, or hips.  Extremities:  Show a walking boot on the left foot.  She has some chronic bilateral edema in her legs.  She has age-related osteoarthritic changes in her joints.  Skin: No rashes, ecchymosis, or petechia.  Neurological:  No focal neurological deficits.  LABORATORY STUDIES:  White cell count is 12.1, hemoglobin 9.1, hematocrit 29.3, platelet count is 322.  MCV is 84.  Peripheral smear shows a slightly microcytic and hypochromic population of red blood cells.  There are no nucleated red blood cells.  I see no teardrop cells.  There are no target cells.  There is no schistocytes or spherocytes.  I see no rouleaux formation.  There is no inclusion bodies.  White cells appear minimally increased in number.  White cells appear mature.  No hypersegmented polys are noted.  She has no atypical lymphocytes.  Platelets are adequate in number and size.  IMPRESSION:  [name] [name] is a very charming 79 year old white female. She has anemia.  I have to suspect that this is iron deficiency.  I am not sure whether she may have lost the blood.  I do not know if this is from her surgery that she had for the toe amputation.  I suspect that she is iron deficient.  We will see what her iron studies show.  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  We are checking erythropoietin level on her.  Given her longstanding diabetes, would not surprise me if she has decreased erythropoietin production.  I cannot imagine her having a hemoglobinopathy.  I do not see anything that would suggest a myelodysplastic process. From the 5q minus syndrome, there is always a possibility in an elderly woman with microcytic indices.  I spent about an  hour with [name] [name] and her daughter.  They both are very, very nice.  I reviewed her lab work with them.  I explained to them I thought that she likely had iron deficiency but I also thought that she likely was not making enough erythropoietin.  I explained to them that we can get back iron intravenously.  I also explained to them that we can get back synthetic erythropoietin through subcutaneous injections.  I told her that I just do not think that she had a bone marrow cancer or myelodysplasia.  I told her that I did not think that she needed a bone marrow biopsy.  Once I get the results back from her anemia studies, we will call her and then get her back in so that we can improve her anemia and improve her quality of life.  Again, I had a wonderful  time with [name] [name].  She is very charming.  I did give her a prayer blanket, which she was very much appreciative of.    ______________________________ Josph Macho, [name] PRE/MEDQ  D:  02/01/2013  T:  02/02/2013  Job:  [id number]

## 2013-02-04 LAB — IRON AND TIBC CHCC
Iron: 35 ug/dL — ABNORMAL LOW (ref 41–142)
TIBC: 260 ug/dL (ref 236–444)
UIBC: 225 ug/dL (ref 120–384)

## 2013-02-04 LAB — FERRITIN CHCC: Ferritin: 68 ng/ml (ref 9–269)

## 2013-02-05 ENCOUNTER — Telehealth: Payer: Self-pay | Admitting: Internal Medicine

## 2013-02-05 DIAGNOSIS — E119 Type 2 diabetes mellitus without complications: Secondary | ICD-10-CM | POA: Diagnosis not present

## 2013-02-05 DIAGNOSIS — I2510 Atherosclerotic heart disease of native coronary artery without angina pectoris: Secondary | ICD-10-CM | POA: Diagnosis not present

## 2013-02-05 DIAGNOSIS — D649 Anemia, unspecified: Secondary | ICD-10-CM | POA: Diagnosis not present

## 2013-02-05 DIAGNOSIS — I10 Essential (primary) hypertension: Secondary | ICD-10-CM | POA: Diagnosis not present

## 2013-02-05 DIAGNOSIS — I5023 Acute on chronic systolic (congestive) heart failure: Secondary | ICD-10-CM | POA: Diagnosis not present

## 2013-02-05 LAB — HEMOGLOBINOPATHY EVALUATION
Hgb A2 Quant: 2.1 % — ABNORMAL LOW (ref 2.2–3.2)
Hgb F Quant: 0 % (ref 0.0–2.0)
Hgb S Quant: 0 %

## 2013-02-05 LAB — RETICULOCYTES (CHCC): ABS Retic: 25.5 10*3/uL (ref 19.0–186.0)

## 2013-02-05 NOTE — Telephone Encounter (Signed)
Ok, give order

## 2013-02-05 NOTE — Telephone Encounter (Signed)
Danielle from [facility] is requesting 2x a week for three weeks and then discharged.   Phone number [phone]

## 2013-02-06 ENCOUNTER — Other Ambulatory Visit: Payer: Self-pay | Admitting: Internal Medicine

## 2013-02-06 DIAGNOSIS — I5023 Acute on chronic systolic (congestive) heart failure: Secondary | ICD-10-CM | POA: Diagnosis not present

## 2013-02-06 DIAGNOSIS — I10 Essential (primary) hypertension: Secondary | ICD-10-CM | POA: Diagnosis not present

## 2013-02-06 DIAGNOSIS — I2510 Atherosclerotic heart disease of native coronary artery without angina pectoris: Secondary | ICD-10-CM | POA: Diagnosis not present

## 2013-02-06 DIAGNOSIS — D649 Anemia, unspecified: Secondary | ICD-10-CM | POA: Diagnosis not present

## 2013-02-06 DIAGNOSIS — E119 Type 2 diabetes mellitus without complications: Secondary | ICD-10-CM | POA: Diagnosis not present

## 2013-02-07 NOTE — Telephone Encounter (Signed)
Done. DJR  

## 2013-02-08 DIAGNOSIS — I83219 Varicose veins of right lower extremity with both ulcer of unspecified site and inflammation: Secondary | ICD-10-CM | POA: Diagnosis not present

## 2013-02-08 DIAGNOSIS — L97209 Non-pressure chronic ulcer of unspecified calf with unspecified severity: Secondary | ICD-10-CM | POA: Diagnosis not present

## 2013-02-08 DIAGNOSIS — E1149 Type 2 diabetes mellitus with other diabetic neurological complication: Secondary | ICD-10-CM | POA: Diagnosis not present

## 2013-02-11 DIAGNOSIS — I2510 Atherosclerotic heart disease of native coronary artery without angina pectoris: Secondary | ICD-10-CM | POA: Diagnosis not present

## 2013-02-11 DIAGNOSIS — E119 Type 2 diabetes mellitus without complications: Secondary | ICD-10-CM | POA: Diagnosis not present

## 2013-02-11 DIAGNOSIS — I10 Essential (primary) hypertension: Secondary | ICD-10-CM | POA: Diagnosis not present

## 2013-02-11 DIAGNOSIS — I5023 Acute on chronic systolic (congestive) heart failure: Secondary | ICD-10-CM | POA: Diagnosis not present

## 2013-02-11 DIAGNOSIS — D649 Anemia, unspecified: Secondary | ICD-10-CM | POA: Diagnosis not present

## 2013-02-13 DIAGNOSIS — E119 Type 2 diabetes mellitus without complications: Secondary | ICD-10-CM | POA: Diagnosis not present

## 2013-02-13 DIAGNOSIS — I10 Essential (primary) hypertension: Secondary | ICD-10-CM | POA: Diagnosis not present

## 2013-02-13 DIAGNOSIS — I2510 Atherosclerotic heart disease of native coronary artery without angina pectoris: Secondary | ICD-10-CM | POA: Diagnosis not present

## 2013-02-13 DIAGNOSIS — D649 Anemia, unspecified: Secondary | ICD-10-CM | POA: Diagnosis not present

## 2013-02-13 DIAGNOSIS — I5023 Acute on chronic systolic (congestive) heart failure: Secondary | ICD-10-CM | POA: Diagnosis not present

## 2013-02-19 DIAGNOSIS — I2510 Atherosclerotic heart disease of native coronary artery without angina pectoris: Secondary | ICD-10-CM | POA: Diagnosis not present

## 2013-02-19 DIAGNOSIS — D649 Anemia, unspecified: Secondary | ICD-10-CM | POA: Diagnosis not present

## 2013-02-19 DIAGNOSIS — I10 Essential (primary) hypertension: Secondary | ICD-10-CM | POA: Diagnosis not present

## 2013-02-19 DIAGNOSIS — E119 Type 2 diabetes mellitus without complications: Secondary | ICD-10-CM | POA: Diagnosis not present

## 2013-02-19 DIAGNOSIS — I5023 Acute on chronic systolic (congestive) heart failure: Secondary | ICD-10-CM | POA: Diagnosis not present

## 2013-02-20 DIAGNOSIS — I2510 Atherosclerotic heart disease of native coronary artery without angina pectoris: Secondary | ICD-10-CM | POA: Diagnosis not present

## 2013-02-20 DIAGNOSIS — I10 Essential (primary) hypertension: Secondary | ICD-10-CM | POA: Diagnosis not present

## 2013-02-20 DIAGNOSIS — D649 Anemia, unspecified: Secondary | ICD-10-CM | POA: Diagnosis not present

## 2013-02-20 DIAGNOSIS — E119 Type 2 diabetes mellitus without complications: Secondary | ICD-10-CM | POA: Diagnosis not present

## 2013-02-20 DIAGNOSIS — I5023 Acute on chronic systolic (congestive) heart failure: Secondary | ICD-10-CM | POA: Diagnosis not present

## 2013-03-07 ENCOUNTER — Other Ambulatory Visit: Payer: Self-pay

## 2013-03-07 MED ORDER — ATORVASTATIN CALCIUM 40 MG PO TABS: 40.0000 mg | ORAL_TABLET | Freq: Every day | ORAL | Status: DC

## 2013-03-09 ENCOUNTER — Other Ambulatory Visit: Payer: Self-pay | Admitting: Internal Medicine

## 2013-03-10 NOTE — Addendum Note (Signed)
Addended by: Burney Gauze R on: 03/10/2013 08:06 PM   Modules accepted: Orders

## 2013-03-11 ENCOUNTER — Telehealth: Payer: Self-pay | Admitting: Hematology & Oncology

## 2013-03-11 ENCOUNTER — Other Ambulatory Visit: Payer: Self-pay

## 2013-03-11 ENCOUNTER — Telehealth: Payer: Self-pay

## 2013-03-11 DIAGNOSIS — D509 Iron deficiency anemia, unspecified: Secondary | ICD-10-CM

## 2013-03-11 NOTE — Telephone Encounter (Addendum)
Message copied by Jennell Corner on Mon Mar 11, 2013  2:13 PM ------      Message from: Burney Gauze R      Created: Sun Mar 10, 2013  8:03 PM       Call her dgtr - Mom's iron is low.  Let us try Feraheme 1020mg  x 1 dose.  Please set this up.  Pete -----Talk to patient daughter Karna Christmas) she will call Liliane Channel tomorrow 03/12/13 to make appointment  after talking to her mom. Orders will be under sign and held.

## 2013-03-11 NOTE — Telephone Encounter (Signed)
Ramipril refilled per protocol. JG//CMA

## 2013-03-11 NOTE — Telephone Encounter (Signed)
Per inbasket left message on Terri's voice mail to call for appointments

## 2013-03-12 ENCOUNTER — Telehealth: Payer: Self-pay | Admitting: Hematology & Oncology

## 2013-03-12 NOTE — Telephone Encounter (Signed)
Left 2nd message on [name] [name]'s voice mail to call for appointment. I scheduled 1-28 appointment with pt for iron

## 2013-03-18 DIAGNOSIS — L97209 Non-pressure chronic ulcer of unspecified calf with unspecified severity: Secondary | ICD-10-CM | POA: Diagnosis not present

## 2013-03-18 DIAGNOSIS — E1149 Type 2 diabetes mellitus with other diabetic neurological complication: Secondary | ICD-10-CM | POA: Diagnosis not present

## 2013-03-18 DIAGNOSIS — I83219 Varicose veins of right lower extremity with both ulcer of unspecified site and inflammation: Secondary | ICD-10-CM | POA: Diagnosis not present

## 2013-03-18 DIAGNOSIS — L97929 Non-pressure chronic ulcer of unspecified part of left lower leg with unspecified severity: Secondary | ICD-10-CM | POA: Diagnosis not present

## 2013-03-20 ENCOUNTER — Ambulatory Visit (HOSPITAL_BASED_OUTPATIENT_CLINIC_OR_DEPARTMENT_OTHER): Payer: Medicare Other

## 2013-03-20 VITALS — BP 152/70 | HR 62 | Temp 97.8°F | Resp 20

## 2013-03-20 DIAGNOSIS — D509 Iron deficiency anemia, unspecified: Secondary | ICD-10-CM

## 2013-03-20 MED ORDER — SODIUM CHLORIDE 0.9 % IJ SOLN
10.0000 mL | INTRAMUSCULAR | Status: DC | PRN
  Filled 2013-03-20: qty 10

## 2013-03-20 MED ORDER — SODIUM CHLORIDE 0.9 % IV SOLN
1020.0000 mg | Freq: Once | INTRAVENOUS | Status: AC
  Administered 2013-03-20: 1020 mg via INTRAVENOUS
  Filled 2013-03-20: qty 34

## 2013-03-20 MED ORDER — SODIUM CHLORIDE 0.9 % IV SOLN
Freq: Once | INTRAVENOUS | Status: AC
  Administered 2013-03-20: 12:00:00 via INTRAVENOUS

## 2013-03-20 NOTE — Patient Instructions (Signed)

## 2013-04-05 ENCOUNTER — Ambulatory Visit: Payer: Medicare Other | Admitting: Hematology & Oncology

## 2013-04-05 ENCOUNTER — Other Ambulatory Visit: Payer: Medicare Other | Admitting: Lab

## 2013-04-08 ENCOUNTER — Other Ambulatory Visit: Payer: Medicare Other | Admitting: Lab

## 2013-04-08 ENCOUNTER — Ambulatory Visit: Payer: Medicare Other | Admitting: Hematology & Oncology

## 2013-04-08 ENCOUNTER — Telehealth: Payer: Self-pay | Admitting: Hematology & Oncology

## 2013-04-08 NOTE — Telephone Encounter (Signed)
Pt moved 2-16 to 2-23 she hurt her leg

## 2013-04-10 ENCOUNTER — Telehealth: Payer: Self-pay | Admitting: Hematology & Oncology

## 2013-04-10 NOTE — Telephone Encounter (Signed)
RN aware pt cx 2-20 lab. Pt is planning on being here for 2-23 appointment

## 2013-04-12 ENCOUNTER — Other Ambulatory Visit: Payer: Medicare Other | Admitting: Lab

## 2013-04-15 ENCOUNTER — Ambulatory Visit (HOSPITAL_BASED_OUTPATIENT_CLINIC_OR_DEPARTMENT_OTHER): Payer: Medicare Other | Admitting: Hematology & Oncology

## 2013-04-15 ENCOUNTER — Other Ambulatory Visit (HOSPITAL_BASED_OUTPATIENT_CLINIC_OR_DEPARTMENT_OTHER): Payer: Medicare Other | Admitting: Lab

## 2013-04-15 ENCOUNTER — Encounter: Payer: Self-pay | Admitting: Hematology & Oncology

## 2013-04-15 VITALS — BP 136/52 | HR 54 | Temp 97.6°F | Resp 12 | Ht 65.0 in | Wt 184.0 lb

## 2013-04-15 DIAGNOSIS — N189 Chronic kidney disease, unspecified: Principal | ICD-10-CM

## 2013-04-15 DIAGNOSIS — D509 Iron deficiency anemia, unspecified: Secondary | ICD-10-CM

## 2013-04-15 DIAGNOSIS — D649 Anemia, unspecified: Secondary | ICD-10-CM

## 2013-04-15 DIAGNOSIS — D631 Anemia in chronic kidney disease: Secondary | ICD-10-CM

## 2013-04-15 LAB — CBC WITH DIFFERENTIAL (CANCER CENTER ONLY)
BASO#: 0 10*3/uL (ref 0.0–0.2)
BASO%: 0.4 % (ref 0.0–2.0)
EOS ABS: 0.3 10*3/uL (ref 0.0–0.5)
EOS%: 2.3 % (ref 0.0–7.0)
HCT: 31.5 % — ABNORMAL LOW (ref 34.8–46.6)
HEMOGLOBIN: 9.7 g/dL — AB (ref 11.6–15.9)
LYMPH#: 2.7 10*3/uL (ref 0.9–3.3)
LYMPH%: 24.3 % (ref 14.0–48.0)
MCH: 27.2 pg (ref 26.0–34.0)
MCHC: 30.8 g/dL — ABNORMAL LOW (ref 32.0–36.0)
MCV: 88 fL (ref 81–101)
MONO#: 1 10*3/uL — ABNORMAL HIGH (ref 0.1–0.9)
MONO%: 9 % (ref 0.0–13.0)
NEUT#: 7.1 10*3/uL — ABNORMAL HIGH (ref 1.5–6.5)
NEUT%: 64 % (ref 39.6–80.0)
PLATELETS: 162 10*3/uL (ref 145–400)
RBC: 3.57 10*6/uL — AB (ref 3.70–5.32)
RDW: 16.4 % — ABNORMAL HIGH (ref 11.1–15.7)
WBC: 11.1 10*3/uL — ABNORMAL HIGH (ref 3.9–10.0)

## 2013-04-15 LAB — CHCC SATELLITE - SMEAR

## 2013-04-15 LAB — RETICULOCYTES (CHCC)
ABS RETIC: 33.3 10*3/uL (ref 19.0–186.0)
RBC.: 3.7 MIL/uL — ABNORMAL LOW (ref 3.87–5.11)
Retic Ct Pct: 0.9 % (ref 0.4–2.3)

## 2013-04-15 NOTE — Progress Notes (Signed)
Hematology and Oncology Follow Up Visit  [name] [name] [id number] 1933/02/10 80 y.o. 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  [name] is back for a second office visit. We first saw [name] [name] in December. At that point in time, she was iron deficient. [name] [name] ferritin was 68 [name] [name] iron saturation was only 13%. We did go ahead and give [name] [name] a dose of IV iron with Feraheme (1020mg ).  [name] [name] erythropoietin level is only 16.  She feels okay. Again the intravenous iron may have helped [name] [name] a little bit.  [name] [name] appetitet is doing okay. She's not bleeding. There is no problems [name] [name] bowels or bladder. He  [name] [name] overall performance status is ECOG 3.  Medications: Current outpatient prescriptions:aspirin 325 MG tablet, Take 325 mg by mouth daily. , Disp: , Rfl: ;  atorvastatin (LIPITOR) 40 MG tablet, Take 1 tablet (40 mg total) by mouth daily., Disp: 30 tablet, Rfl: 6;  BD PEN NEEDLE NANO U/F 32G X 4 MM MISC, , Disp: , Rfl: ;  clopidogrel (PLAVIX) 75 MG tablet, Take 75 mg by mouth daily., Disp: , Rfl: ;  furosemide (LASIX) 40 MG tablet, Take 40 mg by mouth daily. , Disp: , Rfl:  glimepiride (AMARYL) 4 MG tablet, Take 2 mg by mouth daily before breakfast. , Disp: , Rfl: ;  Insulin Glargine (LANTUS SOLOSTAR) 100 UNIT/ML SOPN, Inject 10 Units into the skin daily., Disp: 5 pen, Rfl: 3;  metFORMIN (GLUCOPHAGE) 1000 MG tablet, Take 1,000 mg by mouth 2 (two) times daily with a meal., Disp: , Rfl: ;  metoprolol (LOPRESSOR) 50 MG tablet, Take 50 mg by mouth 2 (two) times daily., Disp: , Rfl:  potassium chloride (K-DUR) 10 MEQ tablet, Take 10 mEq by mouth daily., Disp: , Rfl: ;  ramipril (ALTACE) 5 MG capsule, take 1 capsule by mouth once daily, Disp: 30 capsule, Rfl: 5  Allergies: No Known Allergies  Past Medical History, Surgical history, Social history, and  Family History were reviewed and updated.  Review of Systems: As above  Physical Exam:  height is 5\' 5"  (1.651 m) and weight is 184 lb (83.462 kg). [name] [name] oral temperature is 97.6 F (36.4 C). [name] [name] blood pressure is 136/52 and [name] [name] pulse is 54. [name] [name] respiration is 12.   Elderly white female. She is in a wheelchair. She is alert and oriented. [name] [name] head neck exam shows no scleral icterus. Conjunctiva are slightly pale. There is no adenopathy in the neck. There is no oral lesions. Lungs are clear. Cardiac exam regular rate and rhythm. She has an occasional extra beat. She is a 1/6 systolic murmur. Abdomen is soft. There is no fluid wave. There is no palpable liver or spleen tip. Back exam shows some slight kyphosis. Extremities shows a boot  on [name] [name] left foot. She has some swelling in [name] [name] feet.skin exam shows no rashes. Neurological exam no focal neurological deficits.  Lab Results  Component Value Date   WBC 11.1* 04/15/2013   HGB 9.7* 04/15/2013   HCT 31.5* 04/15/2013   MCV 88 04/15/2013   PLT 162 04/15/2013     Chemistry      Component Value Date/Time   NA 137 12/19/2012 0545   K 4.2 12/19/2012 0545   CL 101 12/19/2012 0545   CO2 28 12/19/2012 0545   BUN 29* 12/19/2012 0545   CREATININE  1.00 12/19/2012 0545   CREATININE 0.83 02/21/2011 1712      Component Value Date/Time   CALCIUM 9.2 12/19/2012 0545   ALKPHOS 376* 12/19/2012 0545   AST 19 12/19/2012 0545   ALT 23 12/19/2012 0545   BILITOT 0.2* 12/19/2012 0545      I looked at [name] [name] blood smear and saw very few microcytic cells. There were no nucleated red blood cells. Platelets were adequate number size. White cells were mature.   Impression and Plan: [name] [name] is a 80 year old white female. She has multi-factorial anemia. We gave [name] [name] IV I. This helped a little bit but in reality,she probably will need ESA to be given. Says that she feels okay right now, I don't thing we have to do this. It is an option depending on [name] [name] blood  counts.  I want to see [name] [name] back in 6 weeks.  We'll recheck in [name] [name] iron studies.  Volanda Napoleon, MD 2/23/201512:15 PM

## 2013-04-16 LAB — IRON AND TIBC CHCC
%SAT: 23 % (ref 21–57)
Iron: 53 ug/dL (ref 41–142)
TIBC: 229 ug/dL — ABNORMAL LOW (ref 236–444)
UIBC: 175 ug/dL (ref 120–384)

## 2013-04-16 LAB — FERRITIN CHCC: FERRITIN: 569 ng/mL — AB (ref 9–269)

## 2013-04-17 ENCOUNTER — Telehealth: Payer: Self-pay | Admitting: *Deleted

## 2013-04-17 NOTE — Telephone Encounter (Signed)
Message copied by Rico Ala on Wed Apr 17, 2013 10:36 AM ------      Message from: Burney Gauze R      Created: Wed Apr 17, 2013  7:45 AM       Call dgtr - iron is good!!  Laurey Arrow ------

## 2013-04-17 NOTE — Telephone Encounter (Signed)
Called patient to let her know that her iron levels are good per d.r ennever

## 2013-04-23 DIAGNOSIS — L97929 Non-pressure chronic ulcer of unspecified part of left lower leg with unspecified severity: Secondary | ICD-10-CM | POA: Diagnosis not present

## 2013-04-23 DIAGNOSIS — E1149 Type 2 diabetes mellitus with other diabetic neurological complication: Secondary | ICD-10-CM | POA: Diagnosis not present

## 2013-04-23 DIAGNOSIS — B351 Tinea unguium: Secondary | ICD-10-CM | POA: Diagnosis not present

## 2013-04-23 DIAGNOSIS — L97209 Non-pressure chronic ulcer of unspecified calf with unspecified severity: Secondary | ICD-10-CM | POA: Diagnosis not present

## 2013-04-23 DIAGNOSIS — I83219 Varicose veins of right lower extremity with both ulcer of unspecified site and inflammation: Secondary | ICD-10-CM | POA: Diagnosis not present

## 2013-04-25 ENCOUNTER — Telehealth: Payer: Self-pay | Admitting: Internal Medicine

## 2013-04-25 MED ORDER — METOPROLOL TARTRATE 50 MG PO TABS: 50.0000 mg | ORAL_TABLET | Freq: Two times a day (BID) | ORAL | Status: DC

## 2013-04-25 NOTE — Telephone Encounter (Signed)
Patient called requesting rx for metoprolol be sent to [facility] on [address]. She has appt for next week.

## 2013-04-25 NOTE — Telephone Encounter (Signed)
rx sent

## 2013-04-26 ENCOUNTER — Other Ambulatory Visit: Payer: Self-pay

## 2013-04-26 MED ORDER — METOPROLOL TARTRATE 50 MG PO TABS: 50.0000 mg | ORAL_TABLET | Freq: Two times a day (BID) | ORAL | Status: DC

## 2013-04-29 ENCOUNTER — Telehealth: Payer: Self-pay | Admitting: Internal Medicine

## 2013-04-29 NOTE — Telephone Encounter (Signed)
Patient called and wanted to see if dr Larose Kells would called into her pharmacy Lantus solostar insulin 100 units   Pharmacy [facility] [address]

## 2013-05-01 MED ORDER — INSULIN GLARGINE 100 UNIT/ML SOLOSTAR PEN: 10.0000 [IU] | PEN_INJECTOR | Freq: Every day | SUBCUTANEOUS | Status: DC

## 2013-05-01 NOTE — Telephone Encounter (Signed)
rx refilled per protocol  

## 2013-05-02 ENCOUNTER — Encounter: Payer: Self-pay | Admitting: Internal Medicine

## 2013-05-02 ENCOUNTER — Ambulatory Visit (INDEPENDENT_AMBULATORY_CARE_PROVIDER_SITE_OTHER): Payer: Medicare Other | Admitting: Internal Medicine

## 2013-05-02 VITALS — BP 147/84 | HR 80 | Temp 98.0°F

## 2013-05-02 DIAGNOSIS — E785 Hyperlipidemia, unspecified: Secondary | ICD-10-CM | POA: Diagnosis not present

## 2013-05-02 DIAGNOSIS — K8020 Calculus of gallbladder without cholecystitis without obstruction: Secondary | ICD-10-CM

## 2013-05-02 DIAGNOSIS — I10 Essential (primary) hypertension: Secondary | ICD-10-CM

## 2013-05-02 DIAGNOSIS — E119 Type 2 diabetes mellitus without complications: Secondary | ICD-10-CM | POA: Diagnosis not present

## 2013-05-02 NOTE — Progress Notes (Signed)
Subjective:    Patient ID: [name] [name], female    DOB: [birth year], 80 y.o.   MRN: [id number]  DOS:  05/02/2013 Type of  visit:  ROV  Diabetes--on oral medications and Lantus, CBGs in the morning 70, 80 or 90. (lantus is very expensive. See assessment and plan) High blood pressure--good medication compliance, ambulatory BP when checked are completely normal. High cholesterol--good compliance with Lipitor. Still having foot issues and left lower extremity edema, followup by orthopedic surgery     ROS No  CP, SOB Denies  nausea, vomiting diarrhea No abdominal pain Denies  blood in the stools  (-) cough, sputum production (-) wheezing, chest congestion  No anxiety, depression    Past Medical History  Diagnosis Date  . Diabetes mellitus   . Hyperlipidemia   . Hypertension   . CAD (coronary artery disease)     s/p stent R side 9/08, re stenosis 1/09, s/p angioplasty 1/09 and a stent on 12/09; re-angioplasty 5/11  . CHF (congestive heart failure)   . Diverticulitis 12/08  . 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 Barrett esophagus     EGD 08/2009, next 2013  . Carotid arterial disease     s/p stent R side 9-08, re stenosis 1-09, s/p angioplasty 1-09 and a stent on 12-09, last  angiogram 06-24-2009  . Abnormal CT scan, chest     last 06-08-2009, see report   . ICD (implantable cardiac defibrillator) in place      12-08-- [employer]   . Stroke   . Collagen vascular disease   . GERD (gastroesophageal reflux disease)     occ  . Anemia     iron def     Past Surgical History  Procedure Laterality Date  . Arterial bypass surgry      08/08/2000  LIMA to LAD, SVG to intermediate, SVG to OM2, SVG to distal RCA  . Cardiac defibrillator placement      [facility]  . Amputation Left 06/08/2012    Procedure: AMPUTATION RAY;  Surgeon: Newt Minion, MD;  Location: [place];  Service: Orthopedics;  Laterality: Left;  Left Foot 1st Ray Amputation    History   Social History  .  Marital Status: Widowed    Spouse Name: N/A    Number of Children: 6  . Years of Education: N/A   Occupational History  . Not on file.   Social History Main Topics  . Smoking status: Never Smoker   . Smokeless tobacco: Never Used     Comment: never used product  . Alcohol Use: No  . Drug Use: No  . Sexual Activity: No   Other Topics Concern  . Not on file   Social History Narrative   Widow, lives by herself, somebody stays with her every night   six children   Doesn't drive anymore , limited    ADLs   [place] visits her         Medication List       This list is accurate as of: 05/02/13 11:59 PM.  Always use your most recent med list.               aspirin 325 MG tablet  Take 325 mg by mouth daily.     atorvastatin 40 MG tablet  Commonly known as:  LIPITOR  Take 1 tablet (40 mg total) by mouth daily.     BD PEN NEEDLE NANO U/F 32G X 4 MM Misc  Generic drug:  Insulin Pen Needle     clopidogrel 75 MG tablet  Commonly known as:  PLAVIX  Take 75 mg by mouth daily.     furosemide 40 MG tablet  Commonly known as:  LASIX  Take 40 mg by mouth daily.     glimepiride 4 MG tablet  Commonly known as:  AMARYL  Take 2 mg by mouth daily before breakfast.     metFORMIN 1000 MG tablet  Commonly known as:  GLUCOPHAGE  Take 1,000 mg by mouth 2 (two) times daily with a meal.     metoprolol 50 MG tablet  Commonly known as:  LOPRESSOR  Take 1 tablet (50 mg total) by mouth 2 (two) times daily.     potassium chloride 10 MEQ tablet  Commonly known as:  K-DUR  Take 10 mEq by mouth daily.     ramipril 5 MG capsule  Commonly known as:  ALTACE  take 1 capsule by mouth once daily           Objective:   Physical Exam BP 147/84  Pulse 80  Temp(Src) 98 F (36.7 C)  SpO2 98% General -- alert, well-developed, NAD.   Lungs -- normal respiratory effort, no intercostal retractions, no accessory muscle use, and normal breath sounds.  Heart-- normal rate, regular rhythm, no  murmur.   Extremities-- Trace right pretibial edema, left leg wrapped  Neurologic--  alert & oriented X3. Speech normal, gait not tested  Psych-- Cognition and judgment appear intact. Cooperative with normal attention span and concentration. No anxious or depressed appearing.      Assessment & Plan:

## 2013-05-02 NOTE — Assessment & Plan Note (Signed)
Well-controlled,Labs

## 2013-05-02 NOTE — Assessment & Plan Note (Signed)
On Lipitor, labs.

## 2013-05-02 NOTE — Progress Notes (Signed)
Pre visit review using our clinic review tool, if applicable. No additional management support is needed unless otherwise documented below in the visit note. 

## 2013-05-02 NOTE — Assessment & Plan Note (Signed)
Denies abdominal pain, nausea or vomiting

## 2013-05-02 NOTE — Assessment & Plan Note (Addendum)
Currently on Lantus 10 units, Amaryl 2 mg and metformin 1000 milligrams twice a day. CBGs are excellent, between 70 and 90 in the morning. Lantus is extremely expensive. Plan: Discontinue Lantus, A1c, watch CBGs carefully. If needed we could restart insulin--->  NPH which can't be bought at [facility] at a much lower price, will require a regular syringe.  Patient will considerr NPH or possibly simply keep buying Lantus.

## 2013-05-02 NOTE — Patient Instructions (Addendum)
S top Lantus Check your blood sugar twice a day: always  in the morning before breakfast an additional time during the day. If the  blood sugars are running consistently more than 200, let me know.  Please come back for blood work next week, fasting: CMP---- hypertension FLP---- hyperlipidemia A1c---- diabetes  Next visit to see me 4 months, fasting for a yearly exam

## 2013-05-03 ENCOUNTER — Telehealth: Payer: Self-pay | Admitting: Internal Medicine

## 2013-05-03 NOTE — Telephone Encounter (Signed)
Relevant patient education assigned to patient using Emmi. ° °

## 2013-05-04 ENCOUNTER — Other Ambulatory Visit: Payer: Self-pay | Admitting: Internal Medicine

## 2013-05-06 DIAGNOSIS — E1149 Type 2 diabetes mellitus with other diabetic neurological complication: Secondary | ICD-10-CM | POA: Diagnosis not present

## 2013-05-06 DIAGNOSIS — L97929 Non-pressure chronic ulcer of unspecified part of left lower leg with unspecified severity: Secondary | ICD-10-CM | POA: Diagnosis not present

## 2013-05-06 DIAGNOSIS — L97209 Non-pressure chronic ulcer of unspecified calf with unspecified severity: Secondary | ICD-10-CM | POA: Diagnosis not present

## 2013-05-06 DIAGNOSIS — I83219 Varicose veins of right lower extremity with both ulcer of unspecified site and inflammation: Secondary | ICD-10-CM | POA: Diagnosis not present

## 2013-05-07 ENCOUNTER — Other Ambulatory Visit (INDEPENDENT_AMBULATORY_CARE_PROVIDER_SITE_OTHER): Payer: Medicare Other

## 2013-05-07 DIAGNOSIS — I10 Essential (primary) hypertension: Secondary | ICD-10-CM | POA: Diagnosis not present

## 2013-05-07 DIAGNOSIS — E785 Hyperlipidemia, unspecified: Secondary | ICD-10-CM | POA: Diagnosis not present

## 2013-05-07 DIAGNOSIS — E119 Type 2 diabetes mellitus without complications: Secondary | ICD-10-CM | POA: Diagnosis not present

## 2013-05-08 ENCOUNTER — Other Ambulatory Visit: Payer: Self-pay | Admitting: Internal Medicine

## 2013-05-08 LAB — COMPREHENSIVE METABOLIC PANEL
ALK PHOS: 125 U/L — AB (ref 39–117)
ALT: 14 U/L (ref 0–35)
AST: 17 U/L (ref 0–37)
Albumin: 3.7 g/dL (ref 3.5–5.2)
BUN: 33 mg/dL — ABNORMAL HIGH (ref 6–23)
CO2: 27 mEq/L (ref 19–32)
Calcium: 9.1 mg/dL (ref 8.4–10.5)
Chloride: 105 mEq/L (ref 96–112)
Creatinine, Ser: 1.2 mg/dL (ref 0.4–1.2)
GFR: 46.57 mL/min — ABNORMAL LOW (ref >60.00)
GLUCOSE: 59 mg/dL — AB (ref 70–99)
Potassium: 3.9 mEq/L (ref 3.5–5.1)
SODIUM: 140 meq/L (ref 135–145)
TOTAL PROTEIN: 6.8 g/dL (ref 6.0–8.3)
Total Bilirubin: 0.4 mg/dL (ref 0.3–1.2)

## 2013-05-08 LAB — LIPID PANEL
CHOLESTEROL: 102 mg/dL (ref 0–200)
HDL: 35.4 mg/dL — ABNORMAL LOW (ref >39.00)
LDL CALC: 42 mg/dL (ref 0–99)
Total CHOL/HDL Ratio: 3
Triglycerides: 122 mg/dL (ref 0.0–149.0)
VLDL: 24.4 mg/dL (ref 0.0–40.0)

## 2013-05-08 LAB — HEMOGLOBIN A1C: Hgb A1c MFr Bld: 6.6 % — ABNORMAL HIGH (ref 4.6–6.5)

## 2013-05-09 ENCOUNTER — Telehealth: Payer: Self-pay

## 2013-05-09 NOTE — Telephone Encounter (Signed)
Relevant patient education assigned to patient using Emmi. ° °

## 2013-05-14 DIAGNOSIS — E1149 Type 2 diabetes mellitus with other diabetic neurological complication: Secondary | ICD-10-CM | POA: Diagnosis not present

## 2013-05-14 DIAGNOSIS — L97209 Non-pressure chronic ulcer of unspecified calf with unspecified severity: Secondary | ICD-10-CM | POA: Diagnosis not present

## 2013-05-14 DIAGNOSIS — I83219 Varicose veins of right lower extremity with both ulcer of unspecified site and inflammation: Secondary | ICD-10-CM | POA: Diagnosis not present

## 2013-05-14 DIAGNOSIS — B351 Tinea unguium: Secondary | ICD-10-CM | POA: Diagnosis not present

## 2013-05-17 ENCOUNTER — Telehealth: Payer: Self-pay | Admitting: *Deleted

## 2013-05-17 ENCOUNTER — Other Ambulatory Visit: Payer: Self-pay | Admitting: Internal Medicine

## 2013-05-17 NOTE — Telephone Encounter (Signed)
Ok x 6 months 

## 2013-05-17 NOTE — Telephone Encounter (Signed)
Pt called to request refill on the following:  furosemide (LASIX) 40 MG tablet  Last OV 05/02/13  She called it in to pharmacy and they told her she was out of refills.

## 2013-05-17 NOTE — Telephone Encounter (Signed)
Pr requesting lasix 40 mg.  Last refilled by [facility] .

## 2013-05-20 MED ORDER — FUROSEMIDE 40 MG PO TABS: 40.0000 mg | ORAL_TABLET | Freq: Every day | ORAL | Status: DC

## 2013-05-20 NOTE — Telephone Encounter (Signed)
Done . rx sent  

## 2013-06-13 ENCOUNTER — Other Ambulatory Visit (HOSPITAL_BASED_OUTPATIENT_CLINIC_OR_DEPARTMENT_OTHER): Payer: Medicare Other | Admitting: Lab

## 2013-06-13 ENCOUNTER — Encounter: Payer: Self-pay | Admitting: Hematology & Oncology

## 2013-06-13 ENCOUNTER — Ambulatory Visit (HOSPITAL_BASED_OUTPATIENT_CLINIC_OR_DEPARTMENT_OTHER): Payer: Medicare Other | Admitting: Hematology & Oncology

## 2013-06-13 VITALS — BP 134/40 | HR 60 | Temp 97.9°F | Resp 14 | Ht 66.0 in | Wt 178.0 lb

## 2013-06-13 DIAGNOSIS — D509 Iron deficiency anemia, unspecified: Secondary | ICD-10-CM | POA: Diagnosis not present

## 2013-06-13 DIAGNOSIS — N189 Chronic kidney disease, unspecified: Principal | ICD-10-CM

## 2013-06-13 DIAGNOSIS — N039 Chronic nephritic syndrome with unspecified morphologic changes: Secondary | ICD-10-CM

## 2013-06-13 DIAGNOSIS — D631 Anemia in chronic kidney disease: Secondary | ICD-10-CM

## 2013-06-13 LAB — IRON AND TIBC CHCC
%SAT: 15 % — AB (ref 21–57)
IRON: 33 ug/dL — AB (ref 41–142)
TIBC: 221 ug/dL — ABNORMAL LOW (ref 236–444)
UIBC: 188 ug/dL (ref 120–384)

## 2013-06-13 LAB — CBC WITH DIFFERENTIAL (CANCER CENTER ONLY)
BASO#: 0 10*3/uL (ref 0.0–0.2)
BASO%: 0.4 % (ref 0.0–2.0)
EOS%: 3 % (ref 0.0–7.0)
Eosinophils Absolute: 0.3 10*3/uL (ref 0.0–0.5)
HCT: 32.1 % — ABNORMAL LOW (ref 34.8–46.6)
HGB: 10.3 g/dL — ABNORMAL LOW (ref 11.6–15.9)
LYMPH#: 2.4 10*3/uL (ref 0.9–3.3)
LYMPH%: 22.1 % (ref 14.0–48.0)
MCH: 28.5 pg (ref 26.0–34.0)
MCHC: 32.1 g/dL (ref 32.0–36.0)
MCV: 89 fL (ref 81–101)
MONO#: 0.9 10*3/uL (ref 0.1–0.9)
MONO%: 8.3 % (ref 0.0–13.0)
NEUT%: 66.2 % (ref 39.6–80.0)
NEUTROS ABS: 7.1 10*3/uL — AB (ref 1.5–6.5)
PLATELETS: 172 10*3/uL (ref 145–400)
RBC: 3.62 10*6/uL — ABNORMAL LOW (ref 3.70–5.32)
RDW: 14.7 % (ref 11.1–15.7)
WBC: 10.7 10*3/uL — AB (ref 3.9–10.0)

## 2013-06-13 LAB — CHCC SATELLITE - SMEAR

## 2013-06-13 LAB — RETICULOCYTES (CHCC)
ABS RETIC: 32.9 10*3/uL (ref 19.0–186.0)
RBC.: 3.66 MIL/uL — AB (ref 3.87–5.11)
Retic Ct Pct: 0.9 % (ref 0.4–2.3)

## 2013-06-13 LAB — FERRITIN CHCC: Ferritin: 288 ng/ml — ABNORMAL HIGH (ref 9–269)

## 2013-06-14 ENCOUNTER — Telehealth: Payer: Self-pay | Admitting: Hematology & Oncology

## 2013-06-14 ENCOUNTER — Other Ambulatory Visit: Payer: Self-pay | Admitting: Nurse Practitioner

## 2013-06-14 ENCOUNTER — Telehealth: Payer: Self-pay | Admitting: Nurse Practitioner

## 2013-06-14 DIAGNOSIS — D509 Iron deficiency anemia, unspecified: Secondary | ICD-10-CM

## 2013-06-14 NOTE — Progress Notes (Signed)
Hematology and Oncology Follow Up Visit  [name] [name] [id number] 1932/05/18 80 y.o. 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  [name] is back for followup. We last saw her back in February. She seems to be doing a little better. Her blood count keeps coming up slowly. She's had no problems bleeding.  She still has issues with healing with her left foot. It is still somewhat swollen.  She's had no fever. There's no nausea vomiting. She's had no cough.  When we last saw her in February her ferritin was 569. Iron saturation was 23%.  Medications: Current outpatient prescriptions:aspirin 325 MG tablet, Take 325 mg by mouth daily. , Disp: , Rfl: ;  atorvastatin (LIPITOR) 40 MG tablet, Take 1 tablet (40 mg total) by mouth daily., Disp: 30 tablet, Rfl: 6;  BD PEN NEEDLE NANO U/F 32G X 4 MM MISC, as needed. , Disp: , Rfl: ;  clopidogrel (PLAVIX) 75 MG tablet, Take 75 mg by mouth daily., Disp: , Rfl:  furosemide (LASIX) 40 MG tablet, Take 1 tablet (40 mg total) by mouth daily., Disp: 30 tablet, Rfl: 6;  glimepiride (AMARYL) 4 MG tablet, take 1 tablet by mouth once daily before BREAKFAST., Disp: , Rfl: ;  metFORMIN (GLUCOPHAGE) 1000 MG tablet, take 1 tablet by mouth twice a day with meals, Disp: 60 tablet, Rfl: 6;  metoprolol (LOPRESSOR) 50 MG tablet, Take 1 tablet (50 mg total) by mouth 2 (two) times daily., Disp: 60 tablet, Rfl: 2 potassium chloride (K-DUR,KLOR-CON) 10 MEQ tablet, take 1 tablet by mouth once daily, Disp: 30 tablet, Rfl: 3;  ramipril (ALTACE) 5 MG capsule, take 1 capsule by mouth once daily, Disp: 30 capsule, Rfl: 5  Allergies: No Known Allergies  Past Medical History, Surgical history, Social history, and Family History were reviewed and updated.  Review of Systems: As above  Physical Exam:  height is 5\' 6"  (1.676 m) and weight is 178 lb (80.74 kg). Her oral temperature is 97.9 F (36.6 C). Her blood pressure is 134/40 and her pulse is 60. Her respiration is 14.   Elderly somewhat frail-appearing white female. Head and neck exam shows no ocular or oral lesions. There are no palpable cervical or supraclavicular lymph nodes. Lungs are clear. Cardiac exam regular in rhythm. Abdomen soft. Has good bowel sounds. There is no fluid wave. There is no palpable liver or spleen. Back exam shows some slight kyphosis. Extremities shows swelling of the left foot. She has stockings on. Skin exam no rashes. Neurological exam nonfocal.  Lab Results  Component Value Date   WBC 10.7* 06/13/2013   HGB 10.3* 06/13/2013   HCT 32.1* 06/13/2013   MCV 89 06/13/2013   PLT 172 06/13/2013     Chemistry      Component Value Date/Time   NA 140 05/08/2013 0811   K 3.9 05/08/2013 0811   CL 105 05/08/2013 0811   CO2 27 05/08/2013 0811   BUN 33* 05/08/2013 0811   CREATININE 1.2 05/08/2013 0811   CREATININE 0.83 02/21/2011 1712      Component Value Date/Time   CALCIUM 9.1 05/08/2013 0811   ALKPHOS 125* 05/08/2013 0811   AST 17 05/08/2013 0811   ALT 14 05/08/2013 0811   BILITOT 0.4 05/08/2013 0811     Ferritin is 288. Iron saturation is only 15%. Total  iron 33.   Impression and Plan: [name] [name] is a very charming 80 year old white female. Her anemia continues to improve slowly. However, her iron is dropping. I think that her anemia will worsen with the lower iron. As such, we will need to go ahead and give her a dose of IV iron.  I don't see that we have to do any Aranesp right now.  We'll go ahead and see her back in another 3 months or so.   Volanda Napoleon, MD 4/24/20157:19 AM

## 2013-06-14 NOTE — Telephone Encounter (Addendum)
Message copied by Jimmy Footman on Fri Jun 14, 2013 11:00 AM ------      Message from: Volanda Napoleon      Created: Fri Jun 14, 2013  6:54 AM       Call dgtr - iron is actually lower.  Need 1 dose of Feraheme at 1020mg  .  Please set up for 2-3 weeks.  pete ------LVM for daughter to call and schedule an appointment for iron. Per Dr. Marin Olp within 2-3 weeks.

## 2013-06-14 NOTE — Telephone Encounter (Signed)
Mailed august schedule no answer of voice mail on home phone

## 2013-06-17 ENCOUNTER — Telehealth: Payer: Self-pay | Admitting: Hematology & Oncology

## 2013-06-17 NOTE — Telephone Encounter (Signed)
Pt aware of 5-7 iron infusion

## 2013-06-27 ENCOUNTER — Ambulatory Visit (HOSPITAL_BASED_OUTPATIENT_CLINIC_OR_DEPARTMENT_OTHER): Payer: Medicare Other

## 2013-06-27 VITALS — BP 133/58 | HR 54 | Temp 97.0°F | Resp 16

## 2013-06-27 DIAGNOSIS — D509 Iron deficiency anemia, unspecified: Secondary | ICD-10-CM

## 2013-06-27 MED ORDER — SODIUM CHLORIDE 0.9 % IV SOLN
INTRAVENOUS | Status: DC
  Administered 2013-06-27: 10:00:00 via INTRAVENOUS

## 2013-06-27 MED ORDER — SODIUM CHLORIDE 0.9 % IV SOLN
1020.0000 mg | Freq: Once | INTRAVENOUS | Status: AC
  Administered 2013-06-27: 1020 mg via INTRAVENOUS
  Filled 2013-06-27: qty 34

## 2013-06-27 NOTE — Patient Instructions (Signed)

## 2013-07-18 ENCOUNTER — Other Ambulatory Visit: Payer: Self-pay | Admitting: Internal Medicine

## 2013-07-29 ENCOUNTER — Telehealth: Payer: Self-pay | Admitting: Internal Medicine

## 2013-07-29 DIAGNOSIS — I739 Peripheral vascular disease, unspecified: Principal | ICD-10-CM

## 2013-07-29 DIAGNOSIS — I779 Disorder of arteries and arterioles, unspecified: Secondary | ICD-10-CM

## 2013-07-29 NOTE — Telephone Encounter (Signed)
Caller name:Nance Danne Baxter Relation to RT:[id number] Call back number: [phone] Pharmacy:[id number], [place]   Reason for call:  Patient called to request another refill for Plavix because she feels like she threw her bottle away. Please advise.

## 2013-07-29 NOTE — Telephone Encounter (Signed)
plavix refill  Last OV- 05/02/13  Last refilled- [facility] providers - Bing Quarry MD

## 2013-07-30 MED ORDER — CLOPIDOGREL BISULFATE 75 MG PO TABS: 75.0000 mg | ORAL_TABLET | Freq: Every day | ORAL | Status: DC

## 2013-07-30 NOTE — Telephone Encounter (Signed)
Refill for plavix sent to [facility] [address], unbale to make pt aware to not having a vm set up

## 2013-07-30 NOTE — Telephone Encounter (Signed)
Okay to refill for 6 months 

## 2013-07-30 NOTE — Telephone Encounter (Signed)
Pt called to see about status of the Plavix refill.  I told her the medical assistant has sent the information to Dr. Larose Kells and waiting for his response.  Please advise.  She missed her dose yesterday.

## 2013-08-28 ENCOUNTER — Ambulatory Visit (INDEPENDENT_AMBULATORY_CARE_PROVIDER_SITE_OTHER): Payer: Medicare Other | Admitting: Internal Medicine

## 2013-08-28 ENCOUNTER — Encounter: Payer: Self-pay | Admitting: Internal Medicine

## 2013-08-28 VITALS — BP 156/69 | HR 56 | Temp 97.8°F

## 2013-08-28 DIAGNOSIS — Z23 Encounter for immunization: Secondary | ICD-10-CM | POA: Diagnosis not present

## 2013-08-28 DIAGNOSIS — E785 Hyperlipidemia, unspecified: Secondary | ICD-10-CM

## 2013-08-28 DIAGNOSIS — Z0000 Encounter for general adult medical examination without abnormal findings: Secondary | ICD-10-CM | POA: Diagnosis not present

## 2013-08-28 DIAGNOSIS — I10 Essential (primary) hypertension: Secondary | ICD-10-CM

## 2013-08-28 DIAGNOSIS — E119 Type 2 diabetes mellitus without complications: Secondary | ICD-10-CM

## 2013-08-28 DIAGNOSIS — I6529 Occlusion and stenosis of unspecified carotid artery: Secondary | ICD-10-CM

## 2013-08-28 DIAGNOSIS — R930 Abnormal findings on diagnostic imaging of skull and head, not elsewhere classified: Secondary | ICD-10-CM | POA: Diagnosis not present

## 2013-08-28 LAB — BASIC METABOLIC PANEL
BUN: 28 mg/dL — ABNORMAL HIGH (ref 6–23)
CALCIUM: 9.3 mg/dL (ref 8.4–10.5)
CO2: 26 meq/L (ref 19–32)
CREATININE: 1 mg/dL (ref 0.4–1.2)
Chloride: 99 mEq/L (ref 96–112)
GFR: 60.35 mL/min (ref >60.00)
Glucose, Bld: 101 mg/dL — ABNORMAL HIGH (ref 70–99)
Potassium: 4.2 mEq/L (ref 3.5–5.1)
SODIUM: 134 meq/L — AB (ref 135–145)

## 2013-08-28 LAB — HEMOGLOBIN A1C: HEMOGLOBIN A1C: 6.6 % — AB (ref 4.6–6.5)

## 2013-08-28 LAB — TSH: TSH: 2.42 u[IU]/mL (ref 0.35–4.50)

## 2013-08-28 MED ORDER — ZOSTER VACCINE LIVE 19400 UNT/0.65ML ~~LOC~~ SOLR: 0.6500 mL | Freq: Once | SUBCUTANEOUS | Status: DC

## 2013-08-28 NOTE — Patient Instructions (Signed)
Get your blood work before you leave   Check the  blood pressure 2 or 3 times a   week be sure it is between 110/60 and 140/85. Ideal blood pressure is 120/80. If it is consistently higher or lower, let me know  Next visit is for routine check up regards your blood sugar , blood pressure   in 4-5 months  No need to come back fasting Please make an appointment     Fall Prevention and Home Safety Falls cause injuries and can affect all age groups. It is possible to use preventive measures to significantly decrease the likelihood of falls. There are many simple measures which can make your home safer and prevent falls. OUTDOORS  Repair cracks and edges of walkways and driveways.  Remove high doorway thresholds.  Trim shrubbery on the main path into your home.  Have good outside lighting.  Clear walkways of tools, rocks, debris, and clutter.  Check that handrails are not broken and are securely fastened. Both sides of steps should have handrails.  Have leaves, snow, and ice cleared regularly.  Use sand or salt on walkways during winter months.  In the garage, clean up grease or oil spills. BATHROOM  Install night lights.  Install grab bars by the toilet and in the tub and shower.  Use non-skid mats or decals in the tub or shower.  Place a plastic non-slip stool in the shower to sit on, if needed.  Keep floors dry and clean up all water on the floor immediately.  Remove soap buildup in the tub or shower on a regular basis.  Secure bath mats with non-slip, double-sided rug tape.  Remove throw rugs and tripping hazards from the floors. BEDROOMS  Install night lights.  Make sure a bedside light is easy to reach.  Do not use oversized bedding.  Keep a telephone by your bedside.  Have a firm chair with side arms to use for getting dressed.  Remove throw rugs and tripping hazards from the floor. KITCHEN  Keep handles on pots and pans turned toward the center of the  stove. Use back burners when possible.  Clean up spills quickly and allow time for drying.  Avoid walking on wet floors.  Avoid hot utensils and knives.  Position shelves so they are not too high or low.  Place commonly used objects within easy reach.  If necessary, use a sturdy step stool with a grab bar when reaching.  Keep electrical cables out of the way.  Do not use floor polish or wax that makes floors slippery. If you must use wax, use non-skid floor wax.  Remove throw rugs and tripping hazards from the floor. STAIRWAYS  Never leave objects on stairs.  Place handrails on both sides of stairways and use them. Fix any loose handrails. Make sure handrails on both sides of the stairways are as long as the stairs.  Check carpeting to make sure it is firmly attached along stairs. Make repairs to worn or loose carpet promptly.  Avoid placing throw rugs at the top or bottom of stairways, or properly secure the rug with carpet tape to prevent slippage. Get rid of throw rugs, if possible.  Have an electrician put in a light switch at the top and bottom of the stairs. OTHER FALL PREVENTION TIPS  Wear low-heel or rubber-soled shoes that are supportive and fit well. Wear closed toe shoes.  When using a stepladder, make sure it is fully opened and both spreaders are  firmly locked. Do not climb a closed stepladder.  Add color or contrast paint or tape to grab bars and handrails in your home. Place contrasting color strips on first and last steps.  Learn and use mobility aids as needed. Install an electrical emergency response system.  Turn on lights to avoid dark areas. Replace light bulbs that burn out immediately. Get light switches that glow.  Arrange furniture to create clear pathways. Keep furniture in the same place.  Firmly attach carpet with non-skid or double-sided tape.  Eliminate uneven floor surfaces.  Select a carpet pattern that does not visually hide the edge of  steps.  Be aware of all pets. OTHER HOME SAFETY TIPS  Set the water temperature for 120 F (48.8 C).  Keep emergency numbers on or near the telephone.  Keep smoke detectors on every level of the home and near sleeping areas. Document Released: 01/28/2002 Document Revised: 08/09/2011 Document Reviewed: 04/29/2011 [facility] Patient Information 2015 [place], [place]. This information is not intended to replace advice given to you by your health care provider. Make sure you discuss any questions you have with your health care provider.

## 2013-08-28 NOTE — Assessment & Plan Note (Addendum)
Chart reviewed, last CT 06/07/2012 see report below, recommend observation for now: The soft tissue mass-like density at the left lung bases felt to  represent an area of "rounded atelectasis", an area of  cicatrization an area of prior inflammation.  This is unlikely to represent malignancy. PET scan could be  utilized for further evaluation but the appearance is classic for  "rounded atelectasis".

## 2013-08-28 NOTE — Assessment & Plan Note (Addendum)
Had a number of procedures  by interventional radiology, last visit in 2011. Currently clinically stable. Plan: Control cardiovascular risk factors

## 2013-08-28 NOTE — Assessment & Plan Note (Signed)
Seems to be stable, plans to reestablish with cardiology

## 2013-08-28 NOTE — Assessment & Plan Note (Signed)
Recommend to see the eye MD regularly Continue with present care, check the A1c

## 2013-08-28 NOTE — Assessment & Plan Note (Signed)
BP today slightly elevated, we'll continue with present care, asked to check BPs regularly Labs

## 2013-08-28 NOTE — Progress Notes (Signed)
Pre visit review using our clinic review tool, if applicable. No additional management support is needed unless otherwise documented below in the visit note. 

## 2013-08-28 NOTE — Assessment & Plan Note (Signed)
Td 2014 PNM shot 2003 prevnar today zostavx : rx provided  Breast and cervical cancer screening: Pro-cons discussed, patient desired not to pursue further screening Never had a colonoscopy, see note from GI, no elective cscope indicated d/t high risk

## 2013-08-28 NOTE — Progress Notes (Signed)
Subjective:    Patient ID: [name] [name], female    DOB: 1932-08-15, 80 y.o.   MRN: [id number]  DOS:  08/28/2013 Type of visit - description: annual  Here for Medicare AWV:  1. Risk factors based on Past M, S, F history: reviewed 2. Physical Activities: has a walker at home  3. Depression/mood: neg screening  4. Hearing: No problemss noted or reported  5. ADL's:  Able to take a shower, dress herself , cooking-cleaning limites , not driving 6. Fall Risk: high, counseled  7. home Safety: does feel safe at home  8. Height, weight, & visual acuity: see VS, uses reading glasses, does not see the eye doctor , rec to see opht at least q year 59. Counseling: provided 10. Labs ordered based on risk factors: if needed  11. Referral Coordination: if needed 12. Care Plan, see assessment and plan  13. Cognitive Assessment: motor skills limited, cognition seems appropriate for age   In addition, today we discussed the following: DM- good medication compliance, no  recent ambulatory blood sugars High cholesterol, good medication compliance, no apparent side effects Hypertension, on meds, no ambulatory BPs. Anemia, followup by Dr. Marin Olp, on IV iron Cardiovascular, asymptomatic, plans to reestablish with another cardiologist (previous MD retired)  ROS No  CP, SOB  Denies  nausea, vomiting diarrhea, blood in the stools (-) cough, sputum production (-) wheezing, chest congestion  No dysuria, gross hematuria, difficulty urinating  No vaginal discharge or bleed; no  genital rash       Past Medical History  Diagnosis Date  . Diabetes mellitus   . Hyperlipidemia   . Hypertension   . CAD (coronary artery disease)     s/p stent R side 9/08, re stenosis 1/09, s/p angioplasty 1/09 and a stent on 12/09; re-angioplasty 5/11  . CHF (congestive heart failure)   . Diverticulitis 12/08  . 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 Barrett esophagus     EGD 08/2009, next 2013  . Carotid arterial disease    s/p stent R side 9-08, re stenosis 1-09, s/p angioplasty 1-09 and a stent on 12-09, last  angiogram 06-24-2009  . Abnormal CT scan, chest     last 06-08-2009, see report   . ICD (implantable cardiac defibrillator) in place      12-08-- [employer]   . Stroke   . Collagen vascular disease   . GERD (gastroesophageal reflux disease)     occ  . Anemia     iron def     Past Surgical History  Procedure Laterality Date  . Arterial bypass surgry      08/08/2000  LIMA to LAD, SVG to intermediate, SVG to OM2, SVG to distal RCA  . Cardiac defibrillator placement      st judes  . Amputation Left 06/08/2012    Procedure: AMPUTATION RAY;  Surgeon: Newt Minion, MD;  Location: [facility];  Service: Orthopedics;  Laterality: Left;  Left Foot 1st Ray Amputation    History   Social History  . Marital Status: Widowed    Spouse Name: N/A    Number of Children: 6  . Years of Education: N/A   Occupational History  . n/a    Social History Main Topics  . Smoking status: Never Smoker   . Smokeless tobacco: Never Used     Comment: never used tobacco  . Alcohol Use: No  . Drug Use: No  . Sexual Activity: No   Other Topics Concern  .  Not on file   Social History Narrative   Widow, a son lives w/ her , somebody stays with her every night   six children   Doesn't drive anymore, limited    ADLs         Family History  Problem Relation Age of Onset  . Breast cancer Mother 51  . Colon cancer Neg Hx   . Tuberculosis Father       Medication List       This list is accurate as of: 08/28/13  5:53 PM.  Always use your most recent med list.               aspirin 325 MG tablet  Take 325 mg by mouth daily.     atorvastatin 40 MG tablet  Commonly known as:  LIPITOR  Take 1 tablet (40 mg total) by mouth daily.     BD PEN NEEDLE NANO U/F 32G X 4 MM Misc  Generic drug:  Insulin Pen Needle  as needed.     clopidogrel 75 MG tablet  Commonly known as:  PLAVIX  Take 1 tablet (75 mg  total) by mouth daily.     furosemide 40 MG tablet  Commonly known as:  LASIX  Take 1 tablet (40 mg total) by mouth daily.     glimepiride 4 MG tablet  Commonly known as:  AMARYL  take 1 tablet by mouth once daily before BREAKFAST.     metFORMIN 1000 MG tablet  Commonly known as:  GLUCOPHAGE  take 1 tablet by mouth twice a day with meals     metoprolol 50 MG tablet  Commonly known as:  LOPRESSOR  take 1 tablet by mouth twice a day     potassium chloride 10 MEQ tablet  Commonly known as:  K-DUR,KLOR-CON  take 1 tablet by mouth once daily     ramipril 5 MG capsule  Commonly known as:  ALTACE  take 1 capsule by mouth once daily     zoster vaccine live (PF) 19400 UNT/0.65ML injection  Commonly known as:  ZOSTAVAX  Inject 19,400 Units into the skin once.            Objective:   Physical Exam BP 156/69  Pulse 56  Temp(Src) 97.8 F (36.6 C)  SpO2 99% General -- alert, well-developed, NAD.  Neck --no thyromegaly   Lungs -- normal respiratory effort, no intercostal retractions, no accessory muscle use, and normal breath sounds.  Heart-- normal rate, regular rhythm, no murmur.  Abdomen-- Not distended, good bowel sounds,soft, non-tender.  Extremities-- ++ edema bilaterally L>>R Neurologic--  alert & oriented X3. Speech normal  Psych-- Cognition and judgment appear intact. Cooperative with normal attention span and concentration. No anxious or depressed appearing.        Assessment & Plan:

## 2013-08-28 NOTE — Assessment & Plan Note (Signed)
Chart and pertinent labs reviewed  Well-controlled at the present time with current medications

## 2013-08-29 DIAGNOSIS — E1149 Type 2 diabetes mellitus with other diabetic neurological complication: Secondary | ICD-10-CM | POA: Diagnosis not present

## 2013-08-29 DIAGNOSIS — L97509 Non-pressure chronic ulcer of other part of unspecified foot with unspecified severity: Secondary | ICD-10-CM | POA: Diagnosis not present

## 2013-08-29 DIAGNOSIS — L97209 Non-pressure chronic ulcer of unspecified calf with unspecified severity: Secondary | ICD-10-CM | POA: Diagnosis not present

## 2013-08-29 DIAGNOSIS — I70269 Atherosclerosis of native arteries of extremities with gangrene, unspecified extremity: Secondary | ICD-10-CM | POA: Diagnosis not present

## 2013-08-30 ENCOUNTER — Encounter: Payer: Self-pay | Admitting: *Deleted

## 2013-09-02 ENCOUNTER — Other Ambulatory Visit: Payer: Self-pay | Admitting: Internal Medicine

## 2013-09-05 DIAGNOSIS — L97509 Non-pressure chronic ulcer of other part of unspecified foot with unspecified severity: Secondary | ICD-10-CM | POA: Diagnosis not present

## 2013-09-05 DIAGNOSIS — E1149 Type 2 diabetes mellitus with other diabetic neurological complication: Secondary | ICD-10-CM | POA: Diagnosis not present

## 2013-09-05 DIAGNOSIS — I70269 Atherosclerosis of native arteries of extremities with gangrene, unspecified extremity: Secondary | ICD-10-CM | POA: Diagnosis not present

## 2013-09-10 ENCOUNTER — Other Ambulatory Visit: Payer: Self-pay | Admitting: Internal Medicine

## 2013-09-16 ENCOUNTER — Other Ambulatory Visit: Payer: Self-pay | Admitting: Internal Medicine

## 2013-09-26 DIAGNOSIS — I70269 Atherosclerosis of native arteries of extremities with gangrene, unspecified extremity: Secondary | ICD-10-CM | POA: Diagnosis not present

## 2013-09-26 DIAGNOSIS — L97209 Non-pressure chronic ulcer of unspecified calf with unspecified severity: Secondary | ICD-10-CM | POA: Diagnosis not present

## 2013-09-26 DIAGNOSIS — E1149 Type 2 diabetes mellitus with other diabetic neurological complication: Secondary | ICD-10-CM | POA: Diagnosis not present

## 2013-10-03 DIAGNOSIS — E1149 Type 2 diabetes mellitus with other diabetic neurological complication: Secondary | ICD-10-CM | POA: Diagnosis not present

## 2013-10-03 DIAGNOSIS — L97209 Non-pressure chronic ulcer of unspecified calf with unspecified severity: Secondary | ICD-10-CM | POA: Diagnosis not present

## 2013-10-03 DIAGNOSIS — I70269 Atherosclerosis of native arteries of extremities with gangrene, unspecified extremity: Secondary | ICD-10-CM | POA: Diagnosis not present

## 2013-10-09 ENCOUNTER — Telehealth: Payer: Self-pay | Admitting: Hematology & Oncology

## 2013-10-09 NOTE — Telephone Encounter (Signed)
Pt moved 8-20 to 9-23

## 2013-10-10 ENCOUNTER — Other Ambulatory Visit: Payer: Medicare Other | Admitting: Lab

## 2013-10-10 ENCOUNTER — Ambulatory Visit: Payer: Medicare Other | Admitting: Hematology & Oncology

## 2013-10-28 ENCOUNTER — Other Ambulatory Visit: Payer: Self-pay | Admitting: Internal Medicine

## 2013-10-29 ENCOUNTER — Other Ambulatory Visit: Payer: Self-pay

## 2013-10-29 MED ORDER — ATORVASTATIN CALCIUM 40 MG PO TABS: 40.0000 mg | ORAL_TABLET | Freq: Every day | ORAL | Status: DC

## 2013-11-12 ENCOUNTER — Other Ambulatory Visit: Payer: Self-pay | Admitting: Dermatology

## 2013-11-12 DIAGNOSIS — C44621 Squamous cell carcinoma of skin of unspecified upper limb, including shoulder: Secondary | ICD-10-CM | POA: Diagnosis not present

## 2013-11-13 ENCOUNTER — Ambulatory Visit (HOSPITAL_BASED_OUTPATIENT_CLINIC_OR_DEPARTMENT_OTHER): Payer: Medicare Other | Admitting: Hematology & Oncology

## 2013-11-13 ENCOUNTER — Ambulatory Visit (HOSPITAL_BASED_OUTPATIENT_CLINIC_OR_DEPARTMENT_OTHER): Payer: Medicare Other | Admitting: Lab

## 2013-11-13 DIAGNOSIS — D649 Anemia, unspecified: Secondary | ICD-10-CM

## 2013-11-13 DIAGNOSIS — E119 Type 2 diabetes mellitus without complications: Secondary | ICD-10-CM

## 2013-11-13 DIAGNOSIS — D509 Iron deficiency anemia, unspecified: Secondary | ICD-10-CM

## 2013-11-13 DIAGNOSIS — N289 Disorder of kidney and ureter, unspecified: Secondary | ICD-10-CM

## 2013-11-13 LAB — IRON AND TIBC CHCC
%SAT: 18 % — AB (ref 21–57)
IRON: 38 ug/dL — AB (ref 41–142)
TIBC: 215 ug/dL — ABNORMAL LOW (ref 236–444)
UIBC: 177 ug/dL (ref 120–384)

## 2013-11-13 LAB — CBC WITH DIFFERENTIAL (CANCER CENTER ONLY)
BASO#: 0 10*3/uL (ref 0.0–0.2)
BASO%: 0.2 % (ref 0.0–2.0)
EOS ABS: 0.3 10*3/uL (ref 0.0–0.5)
EOS%: 2.6 % (ref 0.0–7.0)
HEMATOCRIT: 30.9 % — AB (ref 34.8–46.6)
HGB: 10 g/dL — ABNORMAL LOW (ref 11.6–15.9)
LYMPH#: 2 10*3/uL (ref 0.9–3.3)
LYMPH%: 21.1 % (ref 14.0–48.0)
MCH: 29.2 pg (ref 26.0–34.0)
MCHC: 32.4 g/dL (ref 32.0–36.0)
MCV: 90 fL (ref 81–101)
MONO#: 0.8 10*3/uL (ref 0.1–0.9)
MONO%: 7.8 % (ref 0.0–13.0)
NEUT#: 6.5 10*3/uL (ref 1.5–6.5)
NEUT%: 68.3 % (ref 39.6–80.0)
Platelets: 199 10*3/uL (ref 145–400)
RBC: 3.43 10*6/uL — AB (ref 3.70–5.32)
RDW: 13.6 % (ref 11.1–15.7)
WBC: 9.6 10*3/uL (ref 3.9–10.0)

## 2013-11-13 LAB — CHCC SATELLITE - SMEAR

## 2013-11-13 LAB — RETICULOCYTES (CHCC)
ABS Retic: 31.8 10*3/uL (ref 19.0–186.0)
RBC.: 3.53 MIL/uL — ABNORMAL LOW (ref 3.87–5.11)
RETIC CT PCT: 0.9 % (ref 0.4–2.3)

## 2013-11-13 LAB — FERRITIN CHCC: Ferritin: 419 ng/ml — ABNORMAL HIGH (ref 9–269)

## 2013-11-13 NOTE — Progress Notes (Signed)
Hematology and Oncology Follow Up Visit  Kieu Quiggle [id number] October 10, 1932 80 y.o. 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  Miedema is back for followup. We last saw her back in April. She seems to be doing a little better. Her blood count keeps coming up slowly. She's had no problems bleeding.  More last saw her in April, her ferritin was 288. Her iron saturation was 15%.  She still has issues with healing with her left foot. It is still somewhat swollen.  She's had no fever. There's no nausea vomiting. She's had no cough.   Medications: Current outpatient prescriptions:aspirin 325 MG tablet, Take 325 mg by mouth daily. , Disp: , Rfl: ;  atorvastatin (LIPITOR) 40 MG tablet, Take 1 tablet (40 mg total) by mouth daily., Disp: 30 tablet, Rfl: 6;  clopidogrel (PLAVIX) 75 MG tablet, Take 1 tablet (75 mg total) by mouth daily., Disp: 30 tablet, Rfl: 3;  furosemide (LASIX) 40 MG tablet, Take 1 tablet (40 mg total) by mouth daily., Disp: 30 tablet, Rfl: 6 glimepiride (AMARYL) 4 MG tablet, take 1 tablet by mouth once daily before BREAKFAST., Disp: , Rfl: ;  metFORMIN (GLUCOPHAGE) 1000 MG tablet, take 1 tablet by mouth twice a day with meals, Disp: 60 tablet, Rfl: 6;  metoprolol (LOPRESSOR) 50 MG tablet, take 1 tablet by mouth twice a day, Disp: 60 tablet, Rfl: 0;  potassium chloride (K-DUR,KLOR-CON) 10 MEQ tablet, take 1 tablet by mouth once daily, Disp: 30 tablet, Rfl: 1 ramipril (ALTACE) 5 MG capsule, take 1 capsule by mouth once daily, Disp: 30 capsule, Rfl: 6  Allergies: No Known Allergies  Past Medical History, Surgical history, Social history, and Family History were reviewed and updated.  Review of Systems: As above  Physical Exam:  vitals were not taken for this visit.  Elderly somewhat  frail-appearing white female. Her vital signs show temperature of 98.2. Pulse 65. Blood pressure 149/54. Weight was not taken. Head and neck exam shows no ocular or oral lesions. There are no palpable cervical or supraclavicular lymph nodes. Lungs are clear. Cardiac exam regular in rhythm. Abdomen soft. Has good bowel sounds. There is no fluid wave. There is no palpable liver or spleen. Back exam shows some slight kyphosis. Extremities shows swelling of the left foot. She has stockings on. Skin exam no rashes. Neurological exam nonfocal.  Lab Results  Component Value Date   WBC 9.6 11/13/2013   HGB 10.0* 11/13/2013   HCT 30.9* 11/13/2013   MCV 90 11/13/2013   PLT 199 11/13/2013     Chemistry      Component Value Date/Time   NA 134* 08/28/2013 1002   K 4.2 08/28/2013 1002   CL 99 08/28/2013 1002   CO2 26 08/28/2013 1002   BUN 28* 08/28/2013 1002   CREATININE 1.0 08/28/2013 1002   CREATININE 0.83 02/21/2011 1712      Component Value Date/Time   CALCIUM 9.3 08/28/2013 1002   ALKPHOS 125* 05/08/2013 0811   AST 17 05/08/2013 0811   ALT 14 05/08/2013 0811   BILITOT 0.4 05/08/2013 0811     Ferritin is 419. Iron saturation is only 18%. Total iron 38   Impression and Plan: Ms. Decoster is a very charming 80 year old white female. Her anemia continues to improve slowly. However, her iron is dropping really about  the same. I think that her body do give her another dose of iron. I will have to get her back in.   I think that we give her iron, and we can clearly get her through the holidays and plan to her back in 6 months.Volanda Napoleon, MD 9/23/20156:41 PM

## 2013-11-14 ENCOUNTER — Telehealth: Payer: Self-pay | Admitting: *Deleted

## 2013-11-14 DIAGNOSIS — I70269 Atherosclerosis of native arteries of extremities with gangrene, unspecified extremity: Secondary | ICD-10-CM | POA: Diagnosis not present

## 2013-11-14 DIAGNOSIS — L97209 Non-pressure chronic ulcer of unspecified calf with unspecified severity: Secondary | ICD-10-CM | POA: Diagnosis not present

## 2013-11-14 DIAGNOSIS — E1149 Type 2 diabetes mellitus with other diabetic neurological complication: Secondary | ICD-10-CM | POA: Diagnosis not present

## 2013-11-14 NOTE — Telephone Encounter (Signed)
Message copied by Rico Ala on Thu Nov 14, 2013 11:18 AM ------      Message from: Burney Gauze R      Created: Thu Nov 14, 2013  7:47 AM       Call her dgtr - Iron is lower!!  Need to set up Feraheme 1020mg  x 1 dose.  Do this is 1-2 weeks. Pete ------

## 2013-11-15 ENCOUNTER — Telehealth: Payer: Self-pay | Admitting: Nurse Practitioner

## 2013-11-15 NOTE — Telephone Encounter (Addendum)
Message copied by Jimmy Footman on Fri Nov 15, 2013 11:21 AM ------      Message from: Burney Gauze R      Created: Thu Nov 14, 2013  7:47 AM       Call her dgtr - Iron is lower!!  Need to set up Feraheme 1020mg  x 1 dose.  Do this is 1-2 weeks. Pete ------Left VM for daughter to call us back to schedule this appointment.

## 2013-11-18 ENCOUNTER — Telehealth: Payer: Self-pay | Admitting: Nurse Practitioner

## 2013-11-18 ENCOUNTER — Other Ambulatory Visit: Payer: Self-pay | Admitting: Internal Medicine

## 2013-11-18 ENCOUNTER — Other Ambulatory Visit: Payer: Self-pay | Admitting: Nurse Practitioner

## 2013-11-18 DIAGNOSIS — D509 Iron deficiency anemia, unspecified: Secondary | ICD-10-CM

## 2013-11-18 NOTE — Telephone Encounter (Addendum)
Message copied by Jimmy Footman on Mon Nov 18, 2013  1:05 PM ------      Message from: Burney Gauze R      Created: Thu Nov 14, 2013  7:47 AM       Call her dgtr - Iron is lower!!  Need to set up Feraheme 1020mg  x 1 dose.  Do this is 1-2 weeks. Laurey Arrow ------Daughter verbalized understanding and appointment has been scheduled per [facility].

## 2013-11-26 ENCOUNTER — Ambulatory Visit (HOSPITAL_BASED_OUTPATIENT_CLINIC_OR_DEPARTMENT_OTHER): Payer: Medicare Other

## 2013-11-26 VITALS — BP 172/53 | HR 62 | Temp 97.8°F | Resp 20

## 2013-11-26 DIAGNOSIS — D509 Iron deficiency anemia, unspecified: Secondary | ICD-10-CM | POA: Diagnosis not present

## 2013-11-26 MED ORDER — SODIUM CHLORIDE 0.9 % IV SOLN
1020.0000 mg | Freq: Once | INTRAVENOUS | Status: AC
  Administered 2013-11-26: 1020 mg via INTRAVENOUS
  Filled 2013-11-26: qty 34

## 2013-11-26 NOTE — Patient Instructions (Signed)

## 2013-11-29 ENCOUNTER — Other Ambulatory Visit: Payer: Self-pay | Admitting: Internal Medicine

## 2013-12-02 NOTE — Telephone Encounter (Signed)
Please advise on refill. Patient is way overdue and this was last refilled by pcp. Thanks, MI

## 2013-12-03 ENCOUNTER — Other Ambulatory Visit: Payer: Self-pay

## 2013-12-03 ENCOUNTER — Telehealth: Payer: Self-pay | Admitting: Internal Medicine

## 2013-12-03 DIAGNOSIS — I779 Disorder of arteries and arterioles, unspecified: Secondary | ICD-10-CM

## 2013-12-03 DIAGNOSIS — I739 Peripheral vascular disease, unspecified: Principal | ICD-10-CM

## 2013-12-03 MED ORDER — METOPROLOL TARTRATE 50 MG PO TABS: ORAL_TABLET | ORAL | Status: DC

## 2013-12-03 MED ORDER — CLOPIDOGREL BISULFATE 75 MG PO TABS: 75.0000 mg | ORAL_TABLET | Freq: Every day | ORAL | Status: DC

## 2013-12-03 NOTE — Telephone Encounter (Signed)
Pt is needing new rx for clopidogrel (PLAVIX) 75 MG tablet and metoprolol (lopressor) 50 mg, sent to [facility] on [address].

## 2013-12-03 NOTE — Telephone Encounter (Signed)
Medication sent to [facility] on [address].

## 2013-12-19 ENCOUNTER — Other Ambulatory Visit: Payer: Self-pay | Admitting: Internal Medicine

## 2013-12-31 ENCOUNTER — Other Ambulatory Visit: Payer: Self-pay | Admitting: Dermatology

## 2013-12-31 DIAGNOSIS — C44622 Squamous cell carcinoma of skin of right upper limb, including shoulder: Secondary | ICD-10-CM | POA: Diagnosis not present

## 2013-12-31 DIAGNOSIS — Z85828 Personal history of other malignant neoplasm of skin: Secondary | ICD-10-CM | POA: Diagnosis not present

## 2014-01-02 ENCOUNTER — Other Ambulatory Visit: Payer: Self-pay | Admitting: Internal Medicine

## 2014-01-14 ENCOUNTER — Other Ambulatory Visit: Payer: Self-pay | Admitting: Internal Medicine

## 2014-01-23 IMAGING — CR DG CHEST 2V
2 series · 2 of 2 positions shown · non-contrast
Comparison: Chest radiograph June 10, 2012

CLINICAL DATA: Lethargy and hyperglycemia.

EXAM:
CHEST  2 VIEW

[x chest ap]
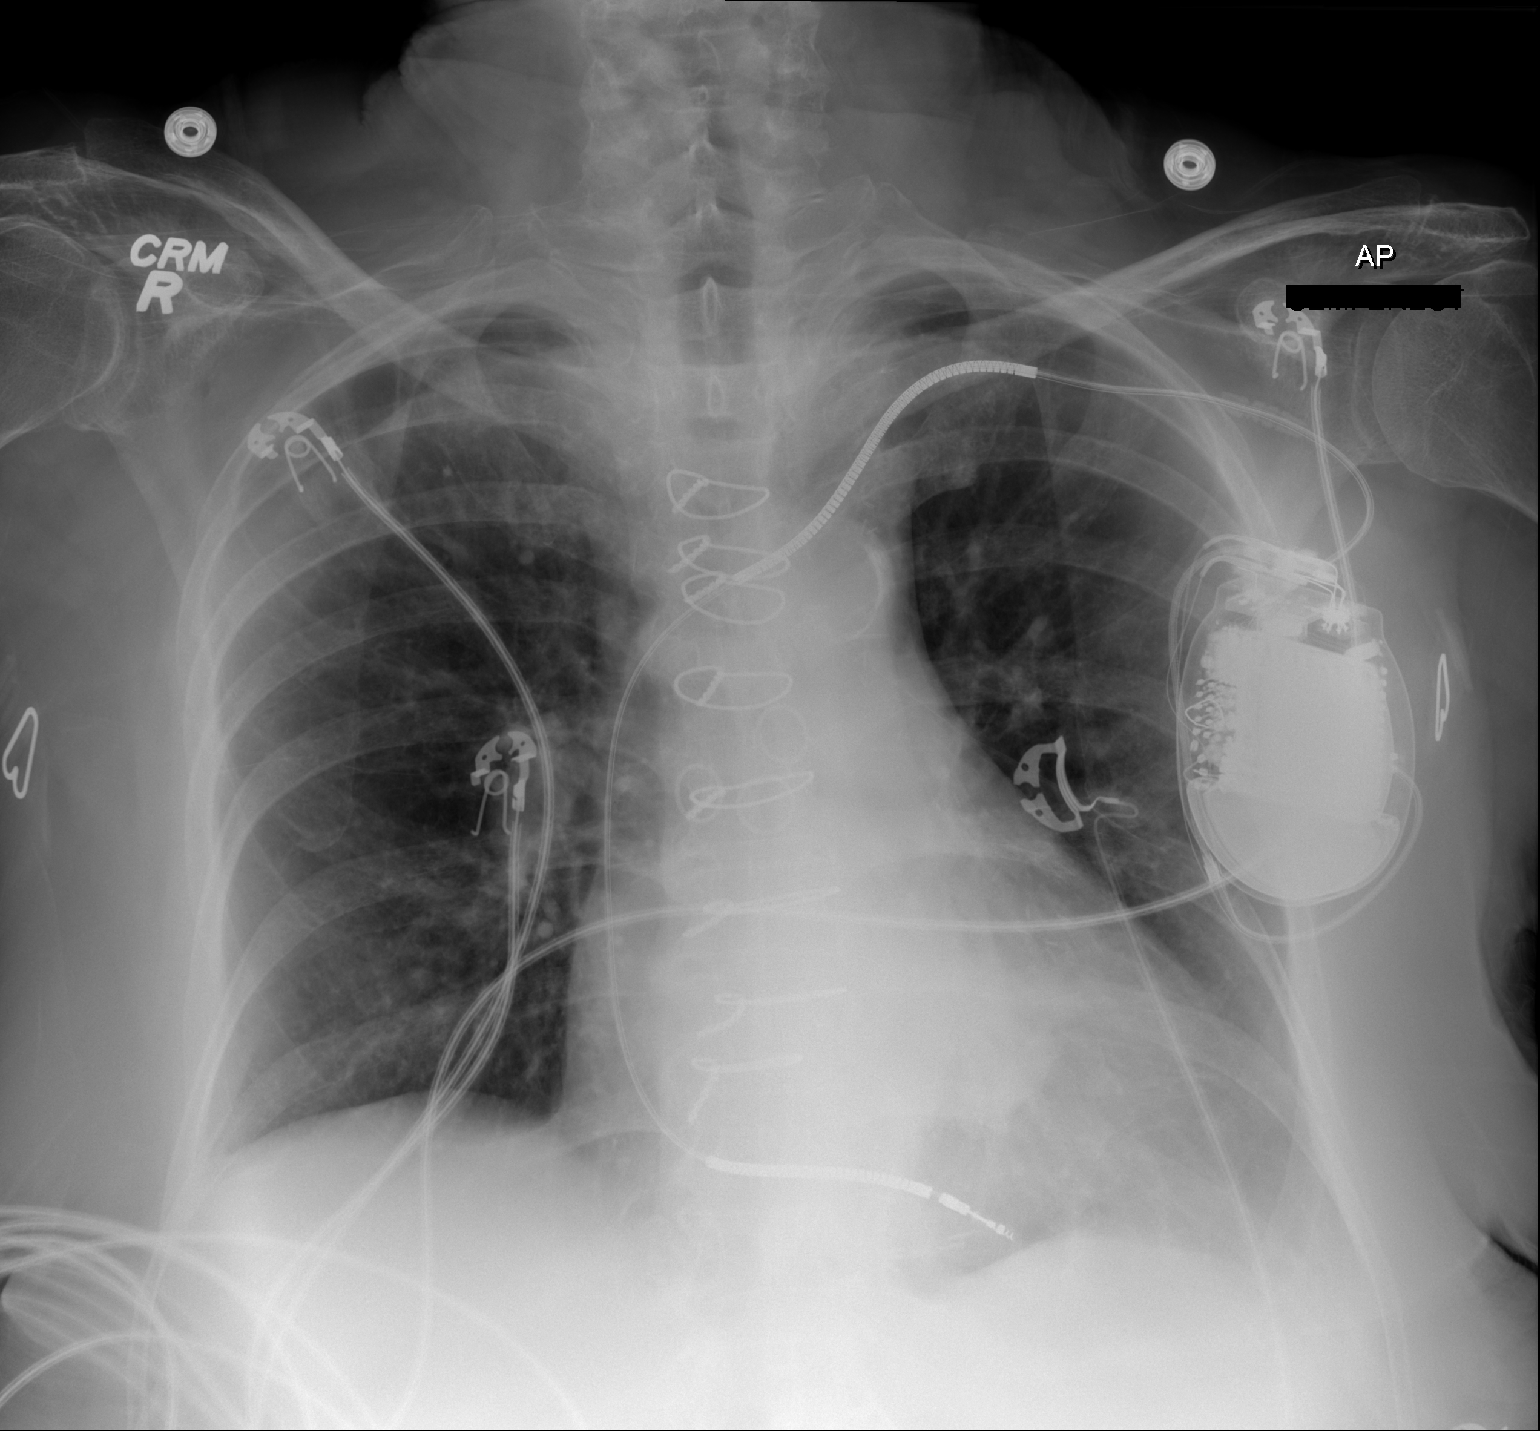

[w chest lat]
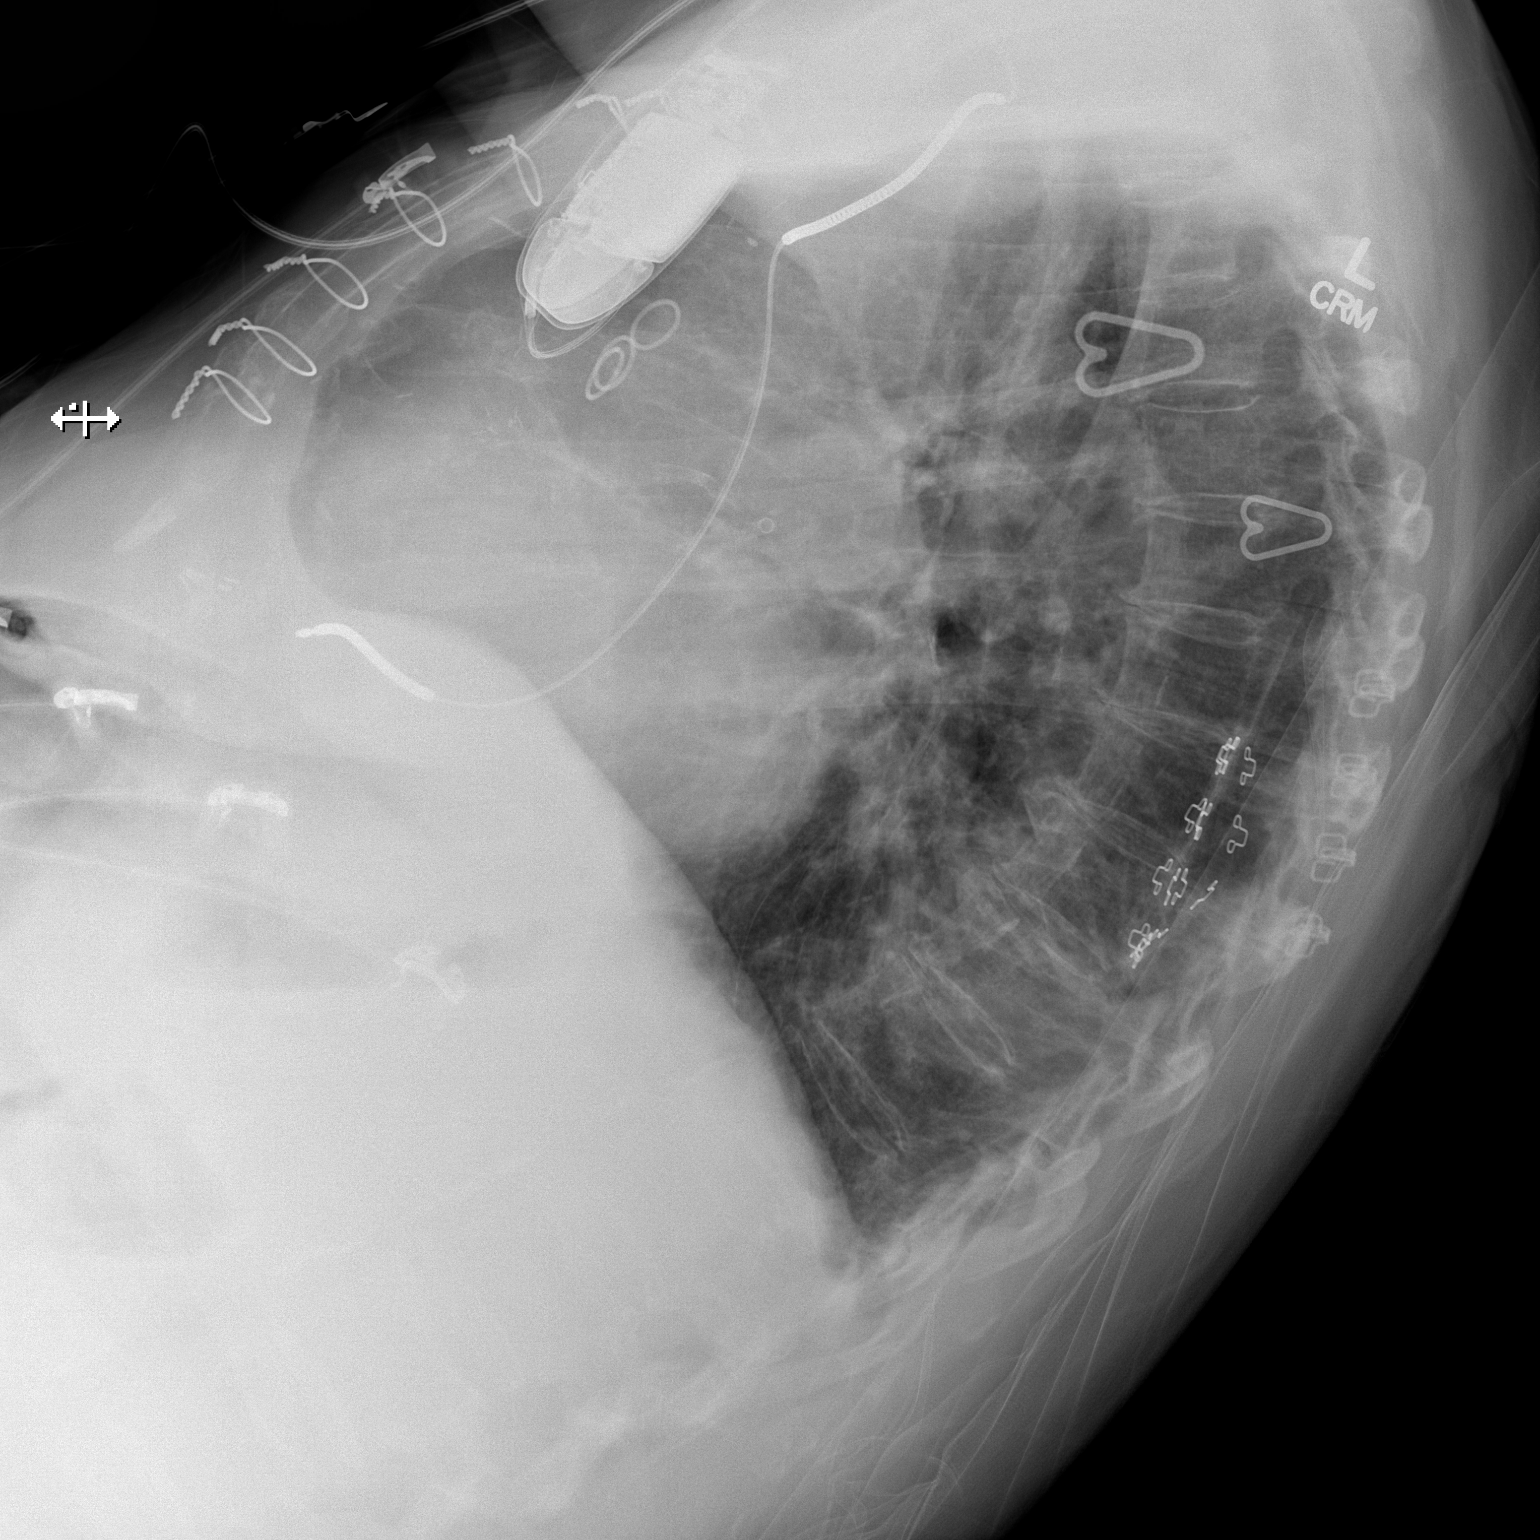

[2 of 2 positions shown; findings below may reference images not displayed]

FINDINGS: Cardiac silhouette remains mild to moderately enlarged, status post
median sternotomy for apparent coronary artery bypass grafting.
Similar retrocardiac rounded masslike density. Small left pleural
effusion. Minimal central pulmonary vasculature congestion, improved
from prior examination. No pneumothorax.

Single lead left cardiac defibrillator in situ. Multiple EKG lines
overlie the patient and may obscure subtle underlying pathology.
IMPRESSION: Stable cardiomegaly with mild, improved central pulmonary
vasculature congestion.

Similar masslike retrocardiac density previously described as round
atelectasis. Please see CT of chest June 07, 2012 for further
description.

  By: Ieshia Matson

## 2014-01-24 ENCOUNTER — Other Ambulatory Visit: Payer: Self-pay | Admitting: Internal Medicine

## 2014-01-28 ENCOUNTER — Ambulatory Visit: Payer: Medicare Other | Admitting: Internal Medicine

## 2014-01-31 ENCOUNTER — Encounter: Payer: Self-pay | Admitting: Internal Medicine

## 2014-01-31 ENCOUNTER — Ambulatory Visit (INDEPENDENT_AMBULATORY_CARE_PROVIDER_SITE_OTHER): Payer: Medicare Other | Admitting: Internal Medicine

## 2014-01-31 VITALS — BP 122/64 | HR 64 | Temp 97.7°F | Wt 185.5 lb

## 2014-01-31 DIAGNOSIS — D509 Iron deficiency anemia, unspecified: Secondary | ICD-10-CM | POA: Diagnosis not present

## 2014-01-31 DIAGNOSIS — I10 Essential (primary) hypertension: Secondary | ICD-10-CM

## 2014-01-31 DIAGNOSIS — I6529 Occlusion and stenosis of unspecified carotid artery: Secondary | ICD-10-CM

## 2014-01-31 DIAGNOSIS — E119 Type 2 diabetes mellitus without complications: Secondary | ICD-10-CM

## 2014-01-31 MED ORDER — GLIMEPIRIDE 1 MG PO TABS: 1.0000 mg | ORAL_TABLET | Freq: Every day | ORAL | Status: DC

## 2014-01-31 MED ORDER — GLUCOSE BLOOD VI STRP: ORAL_STRIP | Status: DC

## 2014-01-31 MED ORDER — ZOSTER VACCINE LIVE 19400 UNT/0.65ML ~~LOC~~ SOLR: 0.6500 mL | Freq: Once | SUBCUTANEOUS | Status: DC

## 2014-01-31 NOTE — Patient Instructions (Signed)
Get your blood work before you leave   Decrease glimepiride to 1 mg tablet: 1 tablet in the morning.  If you plan to skip a meal, do not take glimepiride. Call if your blood sugars are less than 100  Get your eyes checked regularly, once a year  Please come back to the office in 4 months for a routine check up

## 2014-01-31 NOTE — Progress Notes (Signed)
Subjective:    Patient ID: [name] [name], female    DOB: April 01, 1932, 80 y.o.   MRN: [id number]  DOS:  01/31/2014 Type of visit - description : rov, here with her daughter [name] [name] Interval history: Diabetes, good compliance with medications, sometimes her CBGs are as low as 50, once CBGs in the 70 she does not feel well. Good compliance with all medications for blood pressure, not ambulatory BPs   ROS Denies chest pain or difficulty breathing No nausea, vomiting, diarrhea  Past Medical History  Diagnosis Date  . Diabetes mellitus   . Hyperlipidemia   . Hypertension   . CAD (coronary artery disease)     s/p stent R side 9/08, re stenosis 1/09, s/p angioplasty 1/09 and a stent on 12/09; re-angioplasty 5/11  . CHF (congestive heart failure)   . Diverticulitis 12/08  . 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 Barrett esophagus     EGD 08/2009, next 2013  . Carotid arterial disease     s/p stent R side 9-08, re stenosis 1-09, s/p angioplasty 1-09 and a stent on 12-09, last  angiogram 06-24-2009  . Abnormal CT scan, chest     last 06-08-2009, see report   . ICD (implantable cardiac defibrillator) in place      12-08-- [employer]   . Stroke   . Collagen vascular disease   . GERD (gastroesophageal reflux disease)     occ  . Anemia     iron def     Past Surgical History  Procedure Laterality Date  . Arterial bypass surgry      08/08/2000  LIMA to LAD, SVG to intermediate, SVG to OM2, SVG to distal RCA  . Cardiac defibrillator placement      [employer]  . Amputation Left 06/08/2012    Procedure: AMPUTATION RAY;  Surgeon: Newt Minion, MD;  Location: [facility];  Service: Orthopedics;  Laterality: Left;  Left Foot 1st Ray Amputation    History   Social History  . Marital Status: Widowed    Spouse Name: N/A    Number of Children: 6  . Years of Education: N/A   Occupational History  . n/a    Social History Main Topics  . Smoking status: Never Smoker   . Smokeless tobacco:  Never Used     Comment: never used tobacco  . Alcohol Use: No  . Drug Use: No  . Sexual Activity: No   Other Topics Concern  . Not on file   Social History Narrative   Widow, a son lives w/ her , somebody stays with her every night   six children   [name] [name], daughter, usually comes w/ her    Doesn't drive anymore, limited    ADLs            Medication List       This list is accurate as of: 01/31/14 11:59 PM.  Always use your most recent med list.               aspirin 325 MG tablet  Take 325 mg by mouth daily.     atorvastatin 40 MG tablet  Commonly known as:  LIPITOR  Take 1 tablet (40 mg total) by mouth daily.     clopidogrel 75 MG tablet  Commonly known as:  PLAVIX  Take 1 tablet (75 mg total) by mouth daily.     furosemide 40 MG tablet  Commonly known as:  LASIX  take 1 tablet  by mouth once daily     glimepiride 1 MG tablet  Commonly known as:  AMARYL  Take 1 tablet (1 mg total) by mouth daily with breakfast.     glucose blood test strip  Check blood sugars twice daily.     metFORMIN 1000 MG tablet  Commonly known as:  GLUCOPHAGE  take 1 tablet by mouth twice a day with food     metoprolol 50 MG tablet  Commonly known as:  LOPRESSOR  Take 1 tablet twice daily.     potassium chloride 10 MEQ tablet  Commonly known as:  K-DUR,KLOR-CON  take 1 tablet by mouth once daily     ramipril 5 MG capsule  Commonly known as:  ALTACE  take 1 capsule by mouth once daily     zoster vaccine live (PF) 19400 UNT/0.65ML injection  Commonly known as:  ZOSTAVAX  Inject 19,400 Units into the skin once.           Objective:   Physical Exam BP 122/64 mmHg  Pulse 64  Temp(Src) 97.7 F (36.5 C) (Oral)  Wt 185 lb 8 oz (84.142 kg)  SpO2 98% General -- alert, well-developed, NAD. Wheelchair bound   Lungs -- normal respiratory effort, no intercostal retractions, no accessory muscle use, and normal breath sounds.  Heart-- normal rate, regular rhythm, no murmur.    Psych-- Cognition and judgment appear intact. Cooperative with normal attention span and concentration. No anxious or depressed appearing.        Assessment & Plan:  Hypertension, Continue with Lasix, metoprolol, potassium and Altace. Check a BMP  Request a Zostavax prescription which is printed

## 2014-01-31 NOTE — Progress Notes (Signed)
Pre visit review using our clinic review tool, if applicable. No additional management support is needed unless otherwise documented below in the visit note. 

## 2014-02-01 NOTE — Assessment & Plan Note (Signed)
Having hypoglycemia, decrease Amaryl from 4 mg to 1 mg, consider discontinue Amaryl altogether. Continue metformin Labs

## 2014-02-01 NOTE — Assessment & Plan Note (Signed)
Follow-up by hematology, last hemoglobin low, since then she reports she had an iron infusion. Check a hemoglobin.

## 2014-02-16 ENCOUNTER — Telehealth: Payer: Self-pay | Admitting: Internal Medicine

## 2014-02-16 DIAGNOSIS — I10 Essential (primary) hypertension: Secondary | ICD-10-CM

## 2014-02-16 DIAGNOSIS — E119 Type 2 diabetes mellitus without complications: Secondary | ICD-10-CM

## 2014-02-16 NOTE — Telephone Encounter (Signed)
Needs BMP, A1C, Hg ordered at last OV, I don't see the results, please arrange labs

## 2014-02-17 NOTE — Telephone Encounter (Signed)
Please call Pt and have her return to office for lab appt only, she did not have labs drawn on 01/31/2014, last OV. She preferably needs to be fasting.

## 2014-02-22 ENCOUNTER — Other Ambulatory Visit: Payer: Self-pay | Admitting: Internal Medicine

## 2014-02-25 ENCOUNTER — Telehealth: Payer: Self-pay

## 2014-02-25 NOTE — Telephone Encounter (Signed)
Called to patient that [facility] has prescriptions

## 2014-02-25 NOTE — Telephone Encounter (Signed)
[name] [name] [phone] [facility] [place] called and she needs a refill on her metoprolol (LOPRESSOR) 50 MG tablet. The pharmacy also told her if she could get a prescription for her One Touch Ultra strips it would be cheaper for her

## 2014-02-25 NOTE — Telephone Encounter (Signed)
Metoprolol already refilled to [facility] today, # 60 and 2 refills. Glucose test strips refilled on 01/31/2014 to [facility] for her One Touch Ultra Test strips.

## 2014-02-27 DIAGNOSIS — B351 Tinea unguium: Secondary | ICD-10-CM | POA: Diagnosis not present

## 2014-02-27 DIAGNOSIS — I868 Varicose veins of other specified sites: Secondary | ICD-10-CM | POA: Diagnosis not present

## 2014-04-08 ENCOUNTER — Other Ambulatory Visit: Payer: Self-pay | Admitting: Internal Medicine

## 2014-04-09 DIAGNOSIS — L97421 Non-pressure chronic ulcer of left heel and midfoot limited to breakdown of skin: Secondary | ICD-10-CM | POA: Diagnosis not present

## 2014-04-09 DIAGNOSIS — E1142 Type 2 diabetes mellitus with diabetic polyneuropathy: Secondary | ICD-10-CM | POA: Diagnosis not present

## 2014-04-23 ENCOUNTER — Other Ambulatory Visit: Payer: Self-pay | Admitting: Internal Medicine

## 2014-04-23 DIAGNOSIS — L97421 Non-pressure chronic ulcer of left heel and midfoot limited to breakdown of skin: Secondary | ICD-10-CM | POA: Diagnosis not present

## 2014-04-23 DIAGNOSIS — E1142 Type 2 diabetes mellitus with diabetic polyneuropathy: Secondary | ICD-10-CM | POA: Diagnosis not present

## 2014-04-28 ENCOUNTER — Other Ambulatory Visit: Payer: Self-pay | Admitting: Internal Medicine

## 2014-04-28 NOTE — Telephone Encounter (Signed)
Med filled.  

## 2014-05-14 ENCOUNTER — Other Ambulatory Visit: Payer: Medicare Other | Admitting: Lab

## 2014-05-14 ENCOUNTER — Ambulatory Visit: Payer: Medicare Other | Admitting: Family

## 2014-05-15 ENCOUNTER — Other Ambulatory Visit: Payer: Self-pay | Admitting: Family

## 2014-05-27 ENCOUNTER — Ambulatory Visit (HOSPITAL_BASED_OUTPATIENT_CLINIC_OR_DEPARTMENT_OTHER): Payer: Medicare Other | Admitting: Family

## 2014-05-27 ENCOUNTER — Other Ambulatory Visit (HOSPITAL_BASED_OUTPATIENT_CLINIC_OR_DEPARTMENT_OTHER): Payer: Medicare Other

## 2014-05-27 VITALS — BP 162/72 | HR 66 | Temp 97.4°F | Wt 189.0 lb

## 2014-05-27 DIAGNOSIS — N189 Chronic kidney disease, unspecified: Secondary | ICD-10-CM

## 2014-05-27 DIAGNOSIS — D631 Anemia in chronic kidney disease: Secondary | ICD-10-CM | POA: Diagnosis not present

## 2014-05-27 DIAGNOSIS — D509 Iron deficiency anemia, unspecified: Secondary | ICD-10-CM | POA: Diagnosis not present

## 2014-05-27 DIAGNOSIS — R0602 Shortness of breath: Secondary | ICD-10-CM | POA: Diagnosis not present

## 2014-05-27 LAB — CBC WITH DIFFERENTIAL (CANCER CENTER ONLY)
BASO#: 0 10*3/uL (ref 0.0–0.2)
BASO%: 0.3 % (ref 0.0–2.0)
EOS%: 2.3 % (ref 0.0–7.0)
Eosinophils Absolute: 0.3 10*3/uL (ref 0.0–0.5)
HCT: 31.2 % — ABNORMAL LOW (ref 34.8–46.6)
HGB: 10 g/dL — ABNORMAL LOW (ref 11.6–15.9)
LYMPH#: 2.6 10*3/uL (ref 0.9–3.3)
LYMPH%: 23.2 % (ref 14.0–48.0)
MCH: 28.7 pg (ref 26.0–34.0)
MCHC: 32.1 g/dL (ref 32.0–36.0)
MCV: 90 fL (ref 81–101)
MONO#: 0.8 10*3/uL (ref 0.1–0.9)
MONO%: 7.4 % (ref 0.0–13.0)
NEUT#: 7.4 10*3/uL — ABNORMAL HIGH (ref 1.5–6.5)
NEUT%: 66.8 % (ref 39.6–80.0)
Platelets: 191 10*3/uL (ref 145–400)
RBC: 3.48 10*6/uL — ABNORMAL LOW (ref 3.70–5.32)
RDW: 13.2 % (ref 11.1–15.7)
WBC: 11 10*3/uL — AB (ref 3.9–10.0)

## 2014-05-27 LAB — IRON AND TIBC CHCC
%SAT: 17 % — ABNORMAL LOW (ref 21–57)
Iron: 38 ug/dL — ABNORMAL LOW (ref 41–142)
TIBC: 225 ug/dL — ABNORMAL LOW (ref 236–444)
UIBC: 187 ug/dL (ref 120–384)

## 2014-05-27 LAB — FERRITIN CHCC: Ferritin: 404 ng/ml — ABNORMAL HIGH (ref 9–269)

## 2014-05-27 NOTE — Progress Notes (Signed)
Hematology and Oncology Follow Up Visit  [name] [name] [id number] 1933/01/14 81 y.o. 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 She is doing well and had a wonderful Easter with her family.  She denies fever, chills, n/v, cough, rash, dizziness, headache, chest pain, palpitations, abdominal pain, diarrhea, blood in urine or stool. She uses stool softeners and no longer has problems with constipation. She does become SOB at times with exertion.   She has some chronic swelling in her left leg. She takes lasix daily and wears a compression stocking to help with this. She has no tenderness, numbness or tingling in her extremities.  Her appetite is good and she is drinking plenty of fluids. Her weight is stable.  In September her iron saturation was 18% and ferritin was 419. She last had iron in October 2015.   Medications:    Medication List       This list is accurate as of: 05/27/14 11:23 AM.  Always use your most recent med list.               aspirin 325 MG tablet  Take 325 mg by mouth daily.     atorvastatin 40 MG tablet  Commonly known as:  LIPITOR  Take 1 tablet (40 mg total) by mouth daily.     clopidogrel 75 MG tablet  Commonly known as:  PLAVIX  take 1 tablet by mouth once daily     furosemide 40 MG tablet  Commonly known as:  LASIX  take 1 tablet by mouth once daily     glimepiride 1 MG tablet  Commonly known as:  AMARYL  Take 1 tablet (1 mg total) by mouth daily with breakfast.     glucose blood test strip  Check blood sugars twice daily.     metFORMIN 1000 MG tablet  Commonly known as:  GLUCOPHAGE  take 1 tablet by mouth twice a day with food     metoprolol 50 MG tablet  Commonly known as:  LOPRESSOR  take 1 tablet by mouth twice a day     potassium chloride 10 MEQ tablet  Commonly known as:  K-DUR,KLOR-CON  take 1 tablet by mouth once daily     ramipril 5 MG capsule  Commonly known as:  ALTACE  take 1 capsule by mouth once daily     zoster vaccine live (PF) 19400 UNT/0.65ML injection  Commonly known as:  ZOSTAVAX  Inject 19,400 Units into the skin once.        Allergies: No Known Allergies  Past Medical History, Surgical history, Social history, and Family History were reviewed and updated.  Review of Systems: All other 10 point review of systems is negative.   Physical Exam:  vitals were not taken for this visit.  Wt Readings from Last 3 Encounters:  01/31/14 185 lb 8 oz (84.142 kg)  11/13/13 182 lb 12 oz (82.895 kg)  06/13/13 178 lb (80.74 kg)    Ocular: Sclerae unicteric, pupils equal, round and reactive to light Ear-nose-throat: Oropharynx clear, dentition fair Lymphatic: No cervical or supraclavicular adenopathy Lungs no rales or rhonchi, good excursion bilaterally Heart regular rate and rhythm, no murmur appreciated Abd soft, nontender, positive bowel sounds MSK no focal spinal tenderness, no joint edema Neuro: non-focal, well-oriented, appropriate affect Breasts: Deferred  Lab Results  Component Value Date   WBC 11.0* 05/27/2014   HGB 10.0* 05/27/2014   HCT 31.2* 05/27/2014   MCV 90 05/27/2014   PLT 191 05/27/2014   Lab Results  Component Value Date   FERRITIN 419* 11/13/2013   IRON 38* 11/13/2013   TIBC 215* 11/13/2013   UIBC 177 11/13/2013   IRONPCTSAT 18* 11/13/2013   Lab Results  Component Value Date   RETICCTPCT 0.9 11/13/2013   RBC 3.48* 05/27/2014   RETICCTABS 31.8 11/13/2013   No results found for: KPAFRELGTCHN, LAMBDASER, KAPLAMBRATIO No results found for: IGGSERUM, IGA, IGMSERUM No results found for: Odetta Pink, SPEI   Chemistry      Component Value Date/Time   NA 134* 08/28/2013 1002   K 4.2 08/28/2013 1002     CL 99 08/28/2013 1002   CO2 26 08/28/2013 1002   BUN 28* 08/28/2013 1002   CREATININE 1.0 08/28/2013 1002   CREATININE 0.83 02/21/2011 1712      Component Value Date/Time   CALCIUM 9.3 08/28/2013 1002   ALKPHOS 125* 05/08/2013 0811   AST 17 05/08/2013 0811   ALT 14 05/08/2013 0811   BILITOT 0.4 05/08/2013 0811     Impression and Plan: [name] [name] is a very charming 81 year old white female with multifactorial anemia (iron deficiency/renal insufficiency). She is tired at times and SOB with exertions.  Her Hgb today is 10.0 MCV 90. We will see what her iron studies show and bring her in later this week for [facility] if needed.  We will see her back in 6 months for labs and follow-up.  She knows to call here with any questions or concerns. We can certainly see her sooner if need be.   Eliezer Bottom, NP 4/5/201611:23 AM

## 2014-05-28 ENCOUNTER — Telehealth: Payer: Self-pay | Admitting: Hematology & Oncology

## 2014-05-28 LAB — RETICULOCYTES (CHCC)
ABS Retic: 42.2 10*3/uL (ref 19.0–186.0)
RBC.: 3.52 MIL/uL — AB (ref 3.87–5.11)
Retic Ct Pct: 1.2 % (ref 0.4–2.3)

## 2014-05-28 NOTE — Telephone Encounter (Signed)
Mailed oct schedule °

## 2014-06-05 ENCOUNTER — Other Ambulatory Visit: Payer: Self-pay | Admitting: Internal Medicine

## 2014-06-11 DIAGNOSIS — B351 Tinea unguium: Secondary | ICD-10-CM | POA: Diagnosis not present

## 2014-06-11 DIAGNOSIS — I868 Varicose veins of other specified sites: Secondary | ICD-10-CM | POA: Diagnosis not present

## 2014-06-11 DIAGNOSIS — E1142 Type 2 diabetes mellitus with diabetic polyneuropathy: Secondary | ICD-10-CM | POA: Diagnosis not present

## 2014-06-11 DIAGNOSIS — L97421 Non-pressure chronic ulcer of left heel and midfoot limited to breakdown of skin: Secondary | ICD-10-CM | POA: Diagnosis not present

## 2014-07-21 ENCOUNTER — Other Ambulatory Visit: Payer: Self-pay | Admitting: Internal Medicine

## 2014-07-31 ENCOUNTER — Other Ambulatory Visit: Payer: Self-pay | Admitting: Internal Medicine

## 2014-08-01 ENCOUNTER — Other Ambulatory Visit: Payer: Self-pay | Admitting: Internal Medicine

## 2014-08-13 DIAGNOSIS — I868 Varicose veins of other specified sites: Secondary | ICD-10-CM | POA: Diagnosis not present

## 2014-08-13 DIAGNOSIS — E1142 Type 2 diabetes mellitus with diabetic polyneuropathy: Secondary | ICD-10-CM | POA: Diagnosis not present

## 2014-08-13 DIAGNOSIS — L97421 Non-pressure chronic ulcer of left heel and midfoot limited to breakdown of skin: Secondary | ICD-10-CM | POA: Diagnosis not present

## 2014-09-06 ENCOUNTER — Other Ambulatory Visit: Payer: Self-pay | Admitting: Internal Medicine

## 2014-09-17 ENCOUNTER — Telehealth: Payer: Self-pay | Admitting: Internal Medicine

## 2014-09-17 NOTE — Telephone Encounter (Signed)
Recd VM from pt 7/27 to make appt. Called pt x2. No answer no machine.

## 2014-09-23 ENCOUNTER — Other Ambulatory Visit: Payer: Self-pay | Admitting: Internal Medicine

## 2014-10-07 ENCOUNTER — Ambulatory Visit (INDEPENDENT_AMBULATORY_CARE_PROVIDER_SITE_OTHER): Payer: Medicare Other | Admitting: Internal Medicine

## 2014-10-07 ENCOUNTER — Encounter: Payer: Self-pay | Admitting: Internal Medicine

## 2014-10-07 VITALS — BP 128/76 | HR 80 | Temp 97.8°F | Ht 68.0 in | Wt 192.2 lb

## 2014-10-07 DIAGNOSIS — I10 Essential (primary) hypertension: Secondary | ICD-10-CM | POA: Diagnosis not present

## 2014-10-07 DIAGNOSIS — E785 Hyperlipidemia, unspecified: Secondary | ICD-10-CM

## 2014-10-07 DIAGNOSIS — I779 Disorder of arteries and arterioles, unspecified: Secondary | ICD-10-CM

## 2014-10-07 DIAGNOSIS — I739 Peripheral vascular disease, unspecified: Secondary | ICD-10-CM

## 2014-10-07 DIAGNOSIS — E119 Type 2 diabetes mellitus without complications: Secondary | ICD-10-CM

## 2014-10-07 DIAGNOSIS — I25810 Atherosclerosis of coronary artery bypass graft(s) without angina pectoris: Secondary | ICD-10-CM

## 2014-10-07 MED ORDER — RAMIPRIL 5 MG PO CAPS: 5.0000 mg | ORAL_CAPSULE | Freq: Every day | ORAL | Status: DC

## 2014-10-07 MED ORDER — METOPROLOL TARTRATE 50 MG PO TABS: 50.0000 mg | ORAL_TABLET | Freq: Two times a day (BID) | ORAL | Status: DC

## 2014-10-07 MED ORDER — GLIMEPIRIDE 1 MG PO TABS: 1.0000 mg | ORAL_TABLET | Freq: Every day | ORAL | Status: DC

## 2014-10-07 MED ORDER — CLOPIDOGREL BISULFATE 75 MG PO TABS: 75.0000 mg | ORAL_TABLET | Freq: Every day | ORAL | Status: DC

## 2014-10-07 MED ORDER — FUROSEMIDE 40 MG PO TABS: 40.0000 mg | ORAL_TABLET | Freq: Every day | ORAL | Status: DC

## 2014-10-07 MED ORDER — ATORVASTATIN CALCIUM 40 MG PO TABS
40.0000 mg | ORAL_TABLET | Freq: Every day | ORAL | Status: DC
Start: 2014-10-07 — End: 2015-07-14

## 2014-10-07 MED ORDER — POTASSIUM CHLORIDE CRYS ER 10 MEQ PO TBCR: 10.0000 meq | EXTENDED_RELEASE_TABLET | Freq: Every day | ORAL | Status: DC

## 2014-10-07 MED ORDER — METFORMIN HCL 1000 MG PO TABS: 1000.0000 mg | ORAL_TABLET | Freq: Two times a day (BID) | ORAL | Status: DC

## 2014-10-07 NOTE — Assessment & Plan Note (Signed)
Plan is to control her cardiovascular risk factors, no recent imaging, check a carotid ultrasound

## 2014-10-07 NOTE — Progress Notes (Signed)
Subjective:    Patient ID: [name] [name], female    DOB: December 31, 1932, 81 y.o.   MRN: [id number]  DOS:  10/07/2014 Type of visit - description : Routine office visit, here with her daughter Interval history: Hypertension, not ambulatory BPs, good compliance with medications. High cholesterol: On statins, good compliance, no apparent side effects, due for a FLP Diabetes: Last A1c was not done, good compliance w/ medication, ambulatory bps in the 140s or less. Has not seen the eye doctor regularly.    Review of Systems  Denies chest pain or difficulty breathing No nausea, vomiting, diarrhea.  Past Medical History  Diagnosis Date  . Diabetes mellitus   . Hyperlipidemia   . Hypertension   . CAD (coronary artery disease)     s/p stent R side 9/08, re stenosis 1/09, s/p angioplasty 1/09 and a stent on 12/09; re-angioplasty 5/11  . CHF (congestive heart failure)   . Diverticulitis 12/08  . 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 Barrett esophagus     EGD 08/2009, next 2013  . Carotid arterial disease     s/p stent R side 9-08, re stenosis 1-09, s/p angioplasty 1-09 and a stent on 12-09, last  angiogram 06-24-2009  . Abnormal CT scan, chest     last 06-08-2009, see report   . ICD (implantable cardiac defibrillator) in place      12-08-- [employer]   . Stroke   . Collagen vascular disease   . GERD (gastroesophageal reflux disease)     occ  . Anemia     iron def     Past Surgical History  Procedure Laterality Date  . Arterial bypass surgry      08/08/2000  LIMA to LAD, SVG to intermediate, SVG to OM2, SVG to distal RCA  . Cardiac defibrillator placement      [facility]  . Amputation Left 06/08/2012    Procedure: AMPUTATION RAY;  Surgeon: [name] Minion, MD;  Location: [facility];  Service: Orthopedics;  Laterality: Left;  Left Foot 1st Ray Amputation    Social History   Social History  . Marital Status: Widowed    Spouse Name: N/A  . Number of Children: 6  . Years of  Education: N/A   Occupational History  . n/a    Social History Main Topics  . Smoking status: Never Smoker   . Smokeless tobacco: Never Used     Comment: never used tobacco  . Alcohol Use: No  . Drug Use: No  . Sexual Activity: No   Other Topics Concern  . Not on file   Social History Narrative   Widow, a son lives w/ her , somebody stays with her every night   six children   [name] [name], daughter, usually comes w/ her    Doesn't drive anymore, limited    ADLs            Medication List       This list is accurate as of: 10/07/14 11:59 PM.  Always use your most recent med list.               aspirin 325 MG tablet  Take 325 mg by mouth daily.     atorvastatin 40 MG tablet  Commonly known as:  LIPITOR  Take 1 tablet (40 mg total) by mouth daily.     clopidogrel 75 MG tablet  Commonly known as:  PLAVIX  Take 1 tablet (75 mg total) by mouth daily.  furosemide 40 MG tablet  Commonly known as:  LASIX  Take 1 tablet (40 mg total) by mouth daily.     glimepiride 1 MG tablet  Commonly known as:  AMARYL  Take 1 tablet (1 mg total) by mouth daily with breakfast.     glucose blood test strip  Check blood sugars twice daily.     metFORMIN 1000 MG tablet  Commonly known as:  GLUCOPHAGE  Take 1 tablet (1,000 mg total) by mouth 2 (two) times daily with a meal.     metoprolol 50 MG tablet  Commonly known as:  LOPRESSOR  Take 1 tablet (50 mg total) by mouth 2 (two) times daily.     potassium chloride 10 MEQ tablet  Commonly known as:  K-DUR,KLOR-CON  Take 1 tablet (10 mEq total) by mouth daily.     ramipril 5 MG capsule  Commonly known as:  ALTACE  Take 1 capsule (5 mg total) by mouth daily.           Objective:   Physical Exam BP 128/76 mmHg  Pulse 80  Temp(Src) 97.8 F (36.6 C) (Oral)  Ht 5\' 8"  (1.727 m)  Wt 192 lb 4 oz (87.204 kg)  BMI 29.24 kg/m2  SpO2 97% General:   Well developed, well nourished . NAD.  HEENT:  Normocephalic . Face symmetric,  atraumatic Lungs:  CTA B Normal respiratory effort, no intercostal retractions, no accessory muscle use. Heart: RRR,  no murmur.  +/+++ pretibial edema bilaterally , slightly worse on the left. Skin: Not pale. Not jaundice Neurologic:  alert & oriented X3.  Speech normal, gait not tested, sitting comfortably in a wheelchair.  Psych--  Cognition and judgment appear intact.  Cooperative with normal attention span and concentration.  Behavior appropriate. No anxious or depressed appearing.      Assessment & Plan:

## 2014-10-07 NOTE — Patient Instructions (Signed)
Get your blood work before you leave    Please see the eye doctor regularly     Diabetes and Foot Care Diabetes may cause you to have problems because of poor blood supply (circulation) to your feet and legs. This may cause the skin on your feet to become thinner, break easier, and heal more slowly. Your skin may become dry, and the skin may peel and crack. You may also have nerve damage in your legs and feet causing decreased feeling in them. You may not notice minor injuries to your feet that could lead to infections or more serious problems. Taking care of your feet is one of the most important things you can do for yourself.  HOME CARE INSTRUCTIONS  Wear shoes at all times, even in the house. Do not go barefoot. Bare feet are easily injured.  Check your feet daily for blisters, cuts, and redness. If you cannot see the bottom of your feet, use a mirror or ask someone for help.  Wash your feet with warm water (do not use hot water) and mild soap. Then pat your feet and the areas between your toes until they are completely dry. Do not soak your feet as this can dry your skin.  Apply a moisturizing lotion or petroleum jelly (that does not contain alcohol and is unscented) to the skin on your feet and to dry, brittle toenails. Do not apply lotion between your toes.  Trim your toenails straight across. Do not dig under them or around the cuticle. File the edges of your nails with an emery board or nail file.  Do not cut corns or calluses or try to remove them with medicine.  Wear clean socks or stockings every day. Make sure they are not too tight. Do not wear knee-high stockings since they may decrease blood flow to your legs.  Wear shoes that fit properly and have enough cushioning. To break in new shoes, wear them for just a few hours a day. This prevents you from injuring your feet. Always look in your shoes before you put them on to be sure there are no objects inside.  Do not cross  your legs. This may decrease the blood flow to your feet.  If you find a minor scrape, cut, or break in the skin on your feet, keep it and the skin around it clean and dry. These areas may be cleansed with mild soap and water. Do not cleanse the area with peroxide, alcohol, or iodine.  When you remove an adhesive bandage, be sure not to damage the skin around it.  If you have a wound, look at it several times a day to make sure it is healing.  Do not use heating pads or hot water bottles. They may burn your skin. If you have lost feeling in your feet or legs, you may not know it is happening until it is too late.  Make sure your health care provider performs a complete foot exam at least annually or more often if you have foot problems. Report any cuts, sores, or bruises to your health care provider immediately. SEEK MEDICAL CARE IF:   You have an injury that is not healing.  You have cuts or breaks in the skin.  You have an ingrown nail.  You notice redness on your legs or feet.  You feel burning or tingling in your legs or feet.  You have pain or cramps in your legs and feet.  Your legs or  feet are numb.  Your feet always feel cold. SEEK IMMEDIATE MEDICAL CARE IF:   There is increasing redness, swelling, or pain in or around a wound.  There is a red line that goes up your leg.  Pus is coming from a wound.  You develop a fever or as directed by your health care provider.  You notice a bad smell coming from an ulcer or wound. Document Released: 02/05/2000 Document Revised: 10/10/2012 Document Reviewed: 07/17/2012 [facility] Patient Information 2015 [place], [place]. This information is not intended to replace advice given to you by your health care provider. Make sure you discuss any questions you have with your health care provider.

## 2014-10-07 NOTE — Assessment & Plan Note (Signed)
Good compliance with medication, she is not fasting but has a hard time coming back for blood work. Plan: FLP

## 2014-10-07 NOTE — Progress Notes (Signed)
Pre visit review using our clinic review tool, if applicable. No additional management support is needed unless otherwise documented below in the visit note. 

## 2014-10-07 NOTE — Assessment & Plan Note (Addendum)
Check a A1c. Status post a toe  amputation, she sees orthopedic surgery regards her feet frequently. feet care  discussed Recommend to see ophthalmology regularly

## 2014-10-07 NOTE — Assessment & Plan Note (Addendum)
Has not seen cardiology in more than 2 years, history of CHF Plan: Refer to cardiology, echocardiogram in preparation of cards visit

## 2014-10-07 NOTE — Assessment & Plan Note (Signed)
Continue present care, check a CMP

## 2014-10-08 LAB — LIPID PANEL
CHOL/HDL RATIO: 4
CHOLESTEROL: 123 mg/dL (ref 0–200)
HDL: 28.6 mg/dL — ABNORMAL LOW (ref >39.00)
LDL CALC: 59 mg/dL (ref 0–99)
NonHDL: 94.5
Triglycerides: 179 mg/dL — ABNORMAL HIGH (ref 0.0–149.0)
VLDL: 35.8 mg/dL (ref 0.0–40.0)

## 2014-10-08 LAB — COMPREHENSIVE METABOLIC PANEL
ALT: 10 U/L (ref 0–35)
AST: 14 U/L (ref 0–37)
Albumin: 3.8 g/dL (ref 3.5–5.2)
Alkaline Phosphatase: 110 U/L (ref 39–117)
BUN: 28 mg/dL — ABNORMAL HIGH (ref 6–23)
CALCIUM: 9.1 mg/dL (ref 8.4–10.5)
CHLORIDE: 99 meq/L (ref 96–112)
CO2: 28 mEq/L (ref 19–32)
Creatinine, Ser: 1.2 mg/dL (ref 0.40–1.20)
GFR: 45.95 mL/min — AB (ref >60.00)
Glucose, Bld: 87 mg/dL (ref 70–99)
Potassium: 4.3 mEq/L (ref 3.5–5.1)
Sodium: 135 mEq/L (ref 135–145)
TOTAL PROTEIN: 6.7 g/dL (ref 6.0–8.3)
Total Bilirubin: 0.2 mg/dL (ref 0.2–1.2)

## 2014-10-08 LAB — HEMOGLOBIN A1C: HEMOGLOBIN A1C: 6.3 % (ref 4.6–6.5)

## 2014-10-30 DIAGNOSIS — I868 Varicose veins of other specified sites: Secondary | ICD-10-CM | POA: Diagnosis not present

## 2014-10-30 DIAGNOSIS — E1142 Type 2 diabetes mellitus with diabetic polyneuropathy: Secondary | ICD-10-CM | POA: Diagnosis not present

## 2014-10-30 DIAGNOSIS — L97421 Non-pressure chronic ulcer of left heel and midfoot limited to breakdown of skin: Secondary | ICD-10-CM | POA: Diagnosis not present

## 2014-10-30 DIAGNOSIS — B351 Tinea unguium: Secondary | ICD-10-CM | POA: Diagnosis not present

## 2014-11-25 ENCOUNTER — Ambulatory Visit (HOSPITAL_BASED_OUTPATIENT_CLINIC_OR_DEPARTMENT_OTHER): Payer: Medicare Other | Admitting: Family

## 2014-11-25 ENCOUNTER — Encounter: Payer: Self-pay | Admitting: Hematology & Oncology

## 2014-11-25 ENCOUNTER — Ambulatory Visit (HOSPITAL_BASED_OUTPATIENT_CLINIC_OR_DEPARTMENT_OTHER): Payer: Medicare Other

## 2014-11-25 ENCOUNTER — Other Ambulatory Visit (HOSPITAL_BASED_OUTPATIENT_CLINIC_OR_DEPARTMENT_OTHER): Payer: Medicare Other

## 2014-11-25 VITALS — BP 147/55 | HR 59 | Temp 98.1°F | Resp 16 | Ht 68.0 in

## 2014-11-25 DIAGNOSIS — M7989 Other specified soft tissue disorders: Secondary | ICD-10-CM | POA: Diagnosis not present

## 2014-11-25 DIAGNOSIS — N289 Disorder of kidney and ureter, unspecified: Secondary | ICD-10-CM | POA: Diagnosis not present

## 2014-11-25 DIAGNOSIS — Z23 Encounter for immunization: Secondary | ICD-10-CM

## 2014-11-25 DIAGNOSIS — G620 Drug-induced polyneuropathy: Secondary | ICD-10-CM

## 2014-11-25 DIAGNOSIS — R0609 Other forms of dyspnea: Secondary | ICD-10-CM | POA: Diagnosis not present

## 2014-11-25 DIAGNOSIS — D509 Iron deficiency anemia, unspecified: Secondary | ICD-10-CM | POA: Diagnosis not present

## 2014-11-25 DIAGNOSIS — D649 Anemia, unspecified: Secondary | ICD-10-CM | POA: Diagnosis not present

## 2014-11-25 LAB — CBC WITH DIFFERENTIAL (CANCER CENTER ONLY)
BASO#: 0 10*3/uL (ref 0.0–0.2)
BASO%: 0.3 % (ref 0.0–2.0)
EOS ABS: 0.3 10*3/uL (ref 0.0–0.5)
EOS%: 2.7 % (ref 0.0–7.0)
HCT: 27.2 % — ABNORMAL LOW (ref 34.8–46.6)
HEMOGLOBIN: 8.4 g/dL — AB (ref 11.6–15.9)
LYMPH#: 1.7 10*3/uL (ref 0.9–3.3)
LYMPH%: 17.6 % (ref 14.0–48.0)
MCH: 26.3 pg (ref 26.0–34.0)
MCHC: 30.9 g/dL — ABNORMAL LOW (ref 32.0–36.0)
MCV: 85 fL (ref 81–101)
MONO#: 0.8 10*3/uL (ref 0.1–0.9)
MONO%: 8.4 % (ref 0.0–13.0)
NEUT%: 71 % (ref 39.6–80.0)
NEUTROS ABS: 6.6 10*3/uL — AB (ref 1.5–6.5)
PLATELETS: 190 10*3/uL (ref 145–400)
RBC: 3.2 10*6/uL — AB (ref 3.70–5.32)
RDW: 14.1 % (ref 11.1–15.7)
WBC: 9.4 10*3/uL (ref 3.9–10.0)

## 2014-11-25 LAB — RETICULOCYTES (CHCC)
ABS RETIC: 35.6 10*3/uL (ref 19.0–186.0)
RBC.: 3.24 MIL/uL — ABNORMAL LOW (ref 3.87–5.11)
Retic Ct Pct: 1.1 % (ref 0.4–2.3)

## 2014-11-25 LAB — IRON AND TIBC CHCC
%SAT: 10 % — AB (ref 21–57)
IRON: 23 ug/dL — AB (ref 41–142)
TIBC: 234 ug/dL — AB (ref 236–444)
UIBC: 211 ug/dL (ref 120–384)

## 2014-11-25 LAB — FERRITIN CHCC: FERRITIN: 156 ng/mL (ref 9–269)

## 2014-11-25 MED ORDER — INFLUENZA VAC SPLIT QUAD 0.5 ML IM SUSY
0.5000 mL | PREFILLED_SYRINGE | Freq: Once | INTRAMUSCULAR | Status: AC
  Administered 2014-11-25: 0.5 mL via INTRAMUSCULAR
  Filled 2014-11-25: qty 0.5

## 2014-11-25 MED ORDER — SODIUM CHLORIDE 0.9 % IV SOLN
510.0000 mg | Freq: Once | INTRAVENOUS | Status: AC
  Administered 2014-11-25: 510 mg via INTRAVENOUS
  Filled 2014-11-25: qty 17

## 2014-11-25 NOTE — Progress Notes (Signed)
Hematology and Oncology Follow Up Visit  [name] [name] [id number] [birth year] 81 y.o. 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 She is having some mild fatigue and some SOB with exertion. Her Hgb is down today at 8.4. Her last dose of Feraheme was in October 2015.  She denies any episodes of bleeding.  No fever, chills, n/v, cough, rash, dizziness, headache, chest pain, palpitations, abdominal pain, changes in bowel or bladder habits. No blood in her stool.  No lymphadenopathy found on exam.  She is staying active and does well ambulating with a walker.  She is eating well and staying hydrated. Her weight is up 3 lbs since her last visit.  She has chronic swelling of both lower extremities. She wears compressions stockings throughout the day and is also on Lasix. She denies any new aches or pains. She does have some neuropathy in her feet.  He blood sugars have been fairly well controlled. She states that she her average number is in the 140's.   Medications:    Medication List       This list is accurate as of: 11/25/14 10:27 AM.  Always use your most recent med list.               aspirin 325 MG tablet  Take 325 mg by mouth daily.     atorvastatin 40 MG tablet  Commonly known as:  LIPITOR  Take 1 tablet (40 mg total) by mouth daily.     clopidogrel 75 MG tablet  Commonly known as:  PLAVIX  Take 1 tablet (75 mg total) by mouth daily.     furosemide 40 MG tablet  Commonly known as:  LASIX  Take 1 tablet (40 mg total) by mouth daily.     glimepiride 1 MG tablet  Commonly known as:  AMARYL  Take 1 tablet (1 mg total) by mouth daily with breakfast.     glucose blood test strip  Check blood sugars twice daily.     metFORMIN 1000 MG tablet   Commonly known as:  GLUCOPHAGE  Take 1 tablet (1,000 mg total) by mouth 2 (two) times daily with a meal.     metoprolol 50 MG tablet  Commonly known as:  LOPRESSOR  Take 1 tablet (50 mg total) by mouth 2 (two) times daily.     potassium chloride 10 MEQ tablet  Commonly known as:  K-DUR,KLOR-CON  Take 1 tablet (10 mEq total) by mouth daily.     ramipril 5 MG capsule  Commonly known as:  ALTACE  Take 1 capsule (5 mg total) by mouth daily.        Allergies: No Known Allergies  Past Medical History, Surgical history, Social history, and Family History were reviewed and updated.  Review of Systems: All other 10 point review of systems is negative.   Physical Exam:  height is 5\' 8"  (1.727 m). Her oral temperature is 98.1 F (36.7 C). Her blood pressure is 147/55 and her pulse is 59. Her respiration is 16.   Wt Readings from Last 3 Encounters:  10/07/14 192 lb 4 oz (87.204 kg)  05/27/14 189 lb (85.73 kg)  01/31/14 185 lb 8 oz (84.142 kg)    Ocular: Sclerae unicteric, pupils equal, round and reactive to light Ear-nose-throat: Oropharynx clear,  dentition fair Lymphatic: No cervical or supraclavicular adenopathy Lungs no rales or rhonchi, good excursion bilaterally Heart regular rate and rhythm, no murmur appreciated Abd soft, nontender, positive bowel sounds MSK no focal spinal tenderness, no joint edema Neuro: non-focal, well-oriented, appropriate affect Breasts: Deferred  Lab Results  Component Value Date   WBC 9.4 11/25/2014   HGB 8.4* 11/25/2014   HCT 27.2* 11/25/2014   MCV 85 11/25/2014   PLT 190 11/25/2014   Lab Results  Component Value Date   FERRITIN 404* 05/27/2014   IRON 38* 05/27/2014   TIBC 225* 05/27/2014   UIBC 187 05/27/2014   IRONPCTSAT 17* 05/27/2014   Lab Results  Component Value Date   RETICCTPCT 1.2 05/27/2014   RBC 3.20* 11/25/2014   RETICCTABS 42.2 05/27/2014   No results found for: KPAFRELGTCHN, LAMBDASER, KAPLAMBRATIO No results  found for: IGGSERUM, IGA, IGMSERUM No results found for: Ronnald Ramp, A1GS, A2GS, Tillman Sers, SPEI   Chemistry      Component Value Date/Time   NA 135 10/07/2014 1455   K 4.3 10/07/2014 1455   CL 99 10/07/2014 1455   CO2 28 10/07/2014 1455   BUN 28* 10/07/2014 1455   CREATININE 1.20 10/07/2014 1455   CREATININE 0.83 02/21/2011 1712      Component Value Date/Time   CALCIUM 9.1 10/07/2014 1455   ALKPHOS 110 10/07/2014 1455   AST 14 10/07/2014 1455   ALT 10 10/07/2014 1455   BILITOT 0.2 10/07/2014 1455     Impression and Plan: [name] [name] is a very charming 81 year old white female with multifactorial anemia (iron deficiency/renal insufficiency). She is having some mild fatigue and SOB with exertion.  Her Hgb today is 8.4 with an MCV of 85. We will go ahead and give her a dose of Feraheme today.   She will also get her Flu shot while she is here.  We will see her back in 3 months for labs and follow-up.  Both she and her daughter know to contact us with any questions or concerns. We can certainly see her sooner if need be.   Eliezer Bottom, NP 10/4/201610:27 AM

## 2014-11-25 NOTE — Patient Instructions (Signed)
Influenza Virus Vaccine injection (Fluarix) What is this medicine? INFLUENZA VIRUS VACCINE (in floo EN zuh VAHY ruhs vak SEEN) helps to reduce the risk of getting influenza also known as the flu. This medicine may be used for other purposes; ask your health care provider or pharmacist if you have questions. COMMON BRAND NAME(S): Fluarix, Fluzone What should I tell my health care provider before I take this medicine? They need to know if you have any of these conditions: -bleeding disorder like hemophilia -fever or infection -Guillain-Barre syndrome or other neurological problems -immune system problems -infection with the human immunodeficiency virus (HIV) or AIDS -low blood platelet counts -multiple sclerosis -an unusual or allergic reaction to influenza virus vaccine, eggs, chicken proteins, latex, gentamicin, other medicines, foods, dyes or preservatives -pregnant or trying to get pregnant -breast-feeding How should I use this medicine? This vaccine is for injection into a muscle. It is given by a health care professional. A copy of Vaccine Information Statements will be given before each vaccination. Read this sheet carefully each time. The sheet may change frequently. Talk to your pediatrician regarding the use of this medicine in children. Special care may be needed. Overdosage: If you think you have taken too much of this medicine contact a poison control center or emergency room at once. NOTE: This medicine is only for you. Do not share this medicine with others. What if I miss a dose? This does not apply. What may interact with this medicine? -chemotherapy or radiation therapy -medicines that lower your immune system like etanercept, anakinra, infliximab, and adalimumab -medicines that treat or prevent blood clots like warfarin -phenytoin -steroid medicines like prednisone or cortisone -theophylline -vaccines This list may not describe all possible interactions. Give your  health care provider a list of all the medicines, herbs, non-prescription drugs, or dietary supplements you use. Also tell them if you smoke, drink alcohol, or use illegal drugs. Some items may interact with your medicine. What should I watch for while using this medicine? Report any side effects that do not go away within 3 days to your doctor or health care professional. Call your health care provider if any unusual symptoms occur within 6 weeks of receiving this vaccine. You may still catch the flu, but the illness is not usually as bad. You cannot get the flu from the vaccine. The vaccine will not protect against colds or other illnesses that may cause fever. The vaccine is needed every year. What side effects may I notice from receiving this medicine? Side effects that you should report to your doctor or health care professional as soon as possible: -allergic reactions like skin rash, itching or hives, swelling of the face, lips, or tongue Side effects that usually do not require medical attention (report to your doctor or health care professional if they continue or are bothersome): -fever -headache -muscle aches and pains -pain, tenderness, redness, or swelling at site where injected -weak or tired This list may not describe all possible side effects. Call your doctor for medical advice about side effects. You may report side effects to FDA at 1-800-FDA-1088. Where should I keep my medicine? This vaccine is only given in a clinic, pharmacy, doctor's office, or other health care setting and will not be stored at home. NOTE: This sheet is a summary. It may not cover all possible information. If you have questions about this medicine, talk to your doctor, pharmacist, or health care provider.  2015, Elsevier/Gold Standard. (2007-09-05 09:30:40) Ferumoxytol injection What is this   medicine? FERUMOXYTOL is an iron complex. Iron is used to make healthy red blood cells, which carry oxygen and  nutrients throughout the body. This medicine is used to treat iron deficiency anemia in people with chronic kidney disease. This medicine may be used for other purposes; ask your health care provider or pharmacist if you have questions. COMMON BRAND NAME(S): Feraheme What should I tell my health care provider before I take this medicine? They need to know if you have any of these conditions: -anemia not caused by low iron levels -high levels of iron in the blood -magnetic resonance imaging (MRI) test scheduled -an unusual or allergic reaction to iron, other medicines, foods, dyes, or preservatives -pregnant or trying to get pregnant -breast-feeding How should I use this medicine? This medicine is for injection into a vein. It is given by a health care professional in a hospital or clinic setting. Talk to your pediatrician regarding the use of this medicine in children. Special care may be needed. Overdosage: If you think you've taken too much of this medicine contact a poison control center or emergency room at once. Overdosage: If you think you have taken too much of this medicine contact a poison control center or emergency room at once. NOTE: This medicine is only for you. Do not share this medicine with others. What if I miss a dose? It is important not to miss your dose. Call your doctor or health care professional if you are unable to keep an appointment. What may interact with this medicine? This medicine may interact with the following medications: -other iron products This list may not describe all possible interactions. Give your health care provider a list of all the medicines, herbs, non-prescription drugs, or dietary supplements you use. Also tell them if you smoke, drink alcohol, or use illegal drugs. Some items may interact with your medicine. What should I watch for while using this medicine? Visit your doctor or healthcare professional regularly. Tell your doctor or  healthcare professional if your symptoms do not start to get better or if they get worse. You may need blood work done while you are taking this medicine. You may need to follow a special diet. Talk to your doctor. Foods that contain iron include: whole grains/cereals, dried fruits, beans, or peas, leafy green vegetables, and organ meats (liver, kidney). What side effects may I notice from receiving this medicine? Side effects that you should report to your doctor or health care professional as soon as possible: -allergic reactions like skin rash, itching or hives, swelling of the face, lips, or tongue -breathing problems -changes in blood pressure -feeling faint or lightheaded, falls -fever or chills -flushing, sweating, or hot feelings -swelling of the ankles or feet Side effects that usually do not require medical attention (Report these to your doctor or health care professional if they continue or are bothersome.): -diarrhea -headache -nausea, vomiting -stomach pain This list may not describe all possible side effects. Call your doctor for medical advice about side effects. You may report side effects to FDA at 1-800-FDA-1088. Where should I keep my medicine? This drug is given in a hospital or clinic and will not be stored at home. NOTE: This sheet is a summary. It may not cover all possible information. If you have questions about this medicine, talk to your doctor, pharmacist, or health care provider.  2015, Elsevier/Gold Standard. (2011-09-23 15:23:36)  

## 2015-01-01 ENCOUNTER — Telehealth: Payer: Self-pay | Admitting: Internal Medicine

## 2015-01-01 NOTE — Telephone Encounter (Signed)
A carotid ultrasound was ordered but not done. Please advise patient to schedule. If unable to communicate with her, send a letter

## 2015-01-02 NOTE — Telephone Encounter (Signed)
Carotid was sent to [facility] by Anderson Malta on 10/08/2014. Unsure if they have tried contacting Pt to schedule.

## 2015-01-02 NOTE — Telephone Encounter (Signed)
Anderson Malta, please try to schedule it

## 2015-01-07 DIAGNOSIS — L97421 Non-pressure chronic ulcer of left heel and midfoot limited to breakdown of skin: Secondary | ICD-10-CM | POA: Diagnosis not present

## 2015-01-07 DIAGNOSIS — B351 Tinea unguium: Secondary | ICD-10-CM | POA: Diagnosis not present

## 2015-01-07 DIAGNOSIS — E1142 Type 2 diabetes mellitus with diabetic polyneuropathy: Secondary | ICD-10-CM | POA: Diagnosis not present

## 2015-01-20 DIAGNOSIS — B351 Tinea unguium: Secondary | ICD-10-CM | POA: Diagnosis not present

## 2015-01-20 DIAGNOSIS — E1142 Type 2 diabetes mellitus with diabetic polyneuropathy: Secondary | ICD-10-CM | POA: Diagnosis not present

## 2015-01-20 DIAGNOSIS — L97421 Non-pressure chronic ulcer of left heel and midfoot limited to breakdown of skin: Secondary | ICD-10-CM | POA: Diagnosis not present

## 2015-02-03 DIAGNOSIS — L02612 Cutaneous abscess of left foot: Secondary | ICD-10-CM | POA: Diagnosis not present

## 2015-02-10 DIAGNOSIS — E1142 Type 2 diabetes mellitus with diabetic polyneuropathy: Secondary | ICD-10-CM | POA: Diagnosis not present

## 2015-02-10 DIAGNOSIS — L97421 Non-pressure chronic ulcer of left heel and midfoot limited to breakdown of skin: Secondary | ICD-10-CM | POA: Diagnosis not present

## 2015-02-10 DIAGNOSIS — B351 Tinea unguium: Secondary | ICD-10-CM | POA: Diagnosis not present

## 2015-02-10 DIAGNOSIS — L02612 Cutaneous abscess of left foot: Secondary | ICD-10-CM | POA: Diagnosis not present

## 2015-02-19 ENCOUNTER — Telehealth: Payer: Self-pay | Admitting: Internal Medicine

## 2015-02-19 ENCOUNTER — Emergency Department (HOSPITAL_COMMUNITY)
Admission: EM | Admit: 2015-02-19 | Discharge: 2015-02-19 | Disposition: A | Discharge status: Home / Self Care | Payer: Medicare Other | Attending: Emergency Medicine | Admitting: Emergency Medicine

## 2015-02-19 ENCOUNTER — Encounter (HOSPITAL_COMMUNITY): Payer: Self-pay | Admitting: *Deleted

## 2015-02-19 DIAGNOSIS — J3489 Other specified disorders of nose and nasal sinuses: Secondary | ICD-10-CM | POA: Insufficient documentation

## 2015-02-19 DIAGNOSIS — I2510 Atherosclerotic heart disease of native coronary artery without angina pectoris: Secondary | ICD-10-CM | POA: Insufficient documentation

## 2015-02-19 DIAGNOSIS — Z951 Presence of aortocoronary bypass graft: Secondary | ICD-10-CM | POA: Diagnosis not present

## 2015-02-19 DIAGNOSIS — Z8719 Personal history of other diseases of the digestive system: Secondary | ICD-10-CM | POA: Insufficient documentation

## 2015-02-19 DIAGNOSIS — Z8739 Personal history of other diseases of the musculoskeletal system and connective tissue: Secondary | ICD-10-CM | POA: Diagnosis not present

## 2015-02-19 DIAGNOSIS — Z862 Personal history of diseases of the blood and blood-forming organs and certain disorders involving the immune mechanism: Secondary | ICD-10-CM | POA: Insufficient documentation

## 2015-02-19 DIAGNOSIS — Z7902 Long term (current) use of antithrombotics/antiplatelets: Secondary | ICD-10-CM | POA: Diagnosis not present

## 2015-02-19 DIAGNOSIS — E1165 Type 2 diabetes mellitus with hyperglycemia: Secondary | ICD-10-CM | POA: Insufficient documentation

## 2015-02-19 DIAGNOSIS — Z95810 Presence of automatic (implantable) cardiac defibrillator: Secondary | ICD-10-CM | POA: Diagnosis not present

## 2015-02-19 DIAGNOSIS — E785 Hyperlipidemia, unspecified: Secondary | ICD-10-CM | POA: Insufficient documentation

## 2015-02-19 DIAGNOSIS — Z9861 Coronary angioplasty status: Secondary | ICD-10-CM | POA: Insufficient documentation

## 2015-02-19 DIAGNOSIS — Z7982 Long term (current) use of aspirin: Secondary | ICD-10-CM | POA: Diagnosis not present

## 2015-02-19 DIAGNOSIS — I509 Heart failure, unspecified: Secondary | ICD-10-CM | POA: Diagnosis not present

## 2015-02-19 DIAGNOSIS — Z7984 Long term (current) use of oral hypoglycemic drugs: Secondary | ICD-10-CM | POA: Insufficient documentation

## 2015-02-19 DIAGNOSIS — I10 Essential (primary) hypertension: Secondary | ICD-10-CM | POA: Insufficient documentation

## 2015-02-19 DIAGNOSIS — R739 Hyperglycemia, unspecified: Secondary | ICD-10-CM

## 2015-02-19 DIAGNOSIS — Z79899 Other long term (current) drug therapy: Secondary | ICD-10-CM | POA: Diagnosis not present

## 2015-02-19 DIAGNOSIS — Z8673 Personal history of transient ischemic attack (TIA), and cerebral infarction without residual deficits: Secondary | ICD-10-CM | POA: Insufficient documentation

## 2015-02-19 DIAGNOSIS — R600 Localized edema: Secondary | ICD-10-CM | POA: Diagnosis not present

## 2015-02-19 LAB — COMPREHENSIVE METABOLIC PANEL
ALBUMIN: 3.2 g/dL — AB (ref 3.5–5.0)
ALT: 12 U/L — AB (ref 14–54)
AST: 15 U/L (ref 15–41)
Alkaline Phosphatase: 138 U/L — ABNORMAL HIGH (ref 38–126)
Anion gap: 12 (ref 5–15)
BUN: 72 mg/dL — AB (ref 6–20)
CHLORIDE: 106 mmol/L (ref 101–111)
CO2: 21 mmol/L — AB (ref 22–32)
CREATININE: 1.85 mg/dL — AB (ref 0.44–1.00)
Calcium: 9.1 mg/dL (ref 8.9–10.3)
GFR calc Af Amer: 29 mL/min — ABNORMAL LOW (ref >60)
GFR, EST NON AFRICAN AMERICAN: 25 mL/min — AB (ref >60)
GLUCOSE: 199 mg/dL — AB (ref 65–99)
Potassium: 4.2 mmol/L (ref 3.5–5.1)
Sodium: 139 mmol/L (ref 135–145)
Total Bilirubin: 0.4 mg/dL (ref 0.3–1.2)
Total Protein: 6 g/dL — ABNORMAL LOW (ref 6.5–8.1)

## 2015-02-19 LAB — URINALYSIS, ROUTINE W REFLEX MICROSCOPIC
Bilirubin Urine: NEGATIVE
Glucose, UA: NEGATIVE mg/dL
HGB URINE DIPSTICK: NEGATIVE
Ketones, ur: NEGATIVE mg/dL
LEUKOCYTES UA: NEGATIVE
Nitrite: NEGATIVE
Protein, ur: NEGATIVE mg/dL
SPECIFIC GRAVITY, URINE: 1.012 (ref 1.005–1.030)
pH: 5 (ref 5.0–8.0)

## 2015-02-19 LAB — CBC
HCT: 28.8 % — ABNORMAL LOW (ref 36.0–46.0)
HEMOGLOBIN: 9.3 g/dL — AB (ref 12.0–15.0)
MCH: 26.7 pg (ref 26.0–34.0)
MCHC: 32.3 g/dL (ref 30.0–36.0)
MCV: 82.8 fL (ref 78.0–100.0)
Platelets: 199 10*3/uL (ref 150–400)
RBC: 3.48 MIL/uL — AB (ref 3.87–5.11)
RDW: 15.2 % (ref 11.5–15.5)
WBC: 12.2 10*3/uL — ABNORMAL HIGH (ref 4.0–10.5)

## 2015-02-19 LAB — CBG MONITORING, ED
GLUCOSE-CAPILLARY: 185 mg/dL — AB (ref 65–99)
GLUCOSE-CAPILLARY: 47 mg/dL — AB (ref 65–99)
GLUCOSE-CAPILLARY: 102 mg/dL — AB (ref 65–99)

## 2015-02-19 MED ORDER — SODIUM CHLORIDE 0.9 % IV BOLUS (SEPSIS)
1000.0000 mL | Freq: Once | INTRAVENOUS | Status: AC
Start: 2015-02-19 — End: 2015-02-19
  Administered 2015-02-19: 1000 mL via INTRAVENOUS

## 2015-02-19 NOTE — ED Notes (Signed)
[facility] [place] [place] notified.  Patient refused OJ requested chocolate milk and daughter to [place] to get soup   Not giving D50 due to her sugars going up and down

## 2015-02-19 NOTE — Discharge Instructions (Signed)
Please keep a record of your blood sugar and share it with your doctor. Follow instruction below.  Return if your condition worsen or if you have other concerns. Blood Glucose Monitoring, Adult Monitoring your blood glucose (also know as blood sugar) helps you to manage your diabetes. It also helps you and your health care provider monitor your diabetes and determine how well your treatment plan is working. WHY SHOULD YOU MONITOR YOUR BLOOD GLUCOSE?  It can help you understand how food, exercise, and medicine affect your blood glucose.  It allows you to know what your blood glucose is at any given moment. You can quickly tell if you are having low blood glucose (hypoglycemia) or high blood glucose (hyperglycemia).  It can help you and your health care provider know how to adjust your medicines.  It can help you understand how to manage an illness or adjust medicine for exercise. WHEN SHOULD YOU TEST? Your health care provider will help you decide how often you should check your blood glucose. This may depend on the type of diabetes you have, your diabetes control, or the types of medicines you are taking. Be sure to write down all of your blood glucose readings so that this information can be reviewed with your health care provider. See below for examples of testing times that your health care provider may suggest. Type 1 Diabetes  Test at least 2 times per day if your diabetes is well controlled, if you are using an insulin pump, or if you perform multiple daily injections.  If your diabetes is not well controlled or if you are sick, you may need to test more often.  It is a good idea to also test:  Before every insulin injection.  Before and after exercise.  Between meals and 2 hours after a meal.  Occasionally between 2:00 a.m. and 3:00 a.m. Type 2 Diabetes  If you are taking insulin, test at least 2 times per day. However, it is best to test before every insulin injection.  If  you take medicines by mouth (orally), test 2 times a day.  If you are on a controlled diet, test once a day.  If your diabetes is not well controlled or if you are sick, you may need to monitor more often. HOW TO MONITOR YOUR BLOOD GLUCOSE Supplies Needed  Blood glucose meter.  Test strips for your meter. Each meter has its own strips. You must use the strips that go with your own meter.  A pricking needle (lancet).  A device that holds the lancet (lancing device).  A journal or log book to write down your results. Procedure  Wash your hands with soap and water. Alcohol is not preferred.  Prick the side of your finger (not the tip) with the lancet.  Gently milk the finger until a small drop of blood appears.  Follow the instructions that come with your meter for inserting the test strip, applying blood to the strip, and using your blood glucose meter. Other Areas to Get Blood for Testing Some meters allow you to use other areas of your body (other than your finger) to test your blood. These areas are called alternative sites. The most common alternative sites are:  The forearm.  The thigh.  The back area of the lower leg.  The palm of the hand. The blood flow in these areas is slower. Therefore, the blood glucose values you get may be delayed, and the numbers are different from what you would  get from your fingers. Do not use alternative sites if you think you are having hypoglycemia. Your reading will not be accurate. Always use a finger if you are having hypoglycemia. Also, if you cannot feel your lows (hypoglycemia unawareness), always use your fingers for your blood glucose checks. ADDITIONAL TIPS FOR GLUCOSE MONITORING  Do not reuse lancets.  Always carry your supplies with you.  All blood glucose meters have a 24-hour "hotline" number to call if you have questions or need help.  Adjust (calibrate) your blood glucose meter with a control solution after finishing a  few boxes of strips. BLOOD GLUCOSE RECORD KEEPING It is a good idea to keep a daily record or log of your blood glucose readings. Most glucose meters, if not all, keep your glucose records stored in the meter. Some meters come with the ability to download your records to your home computer. Keeping a record of your blood glucose readings is especially helpful if you are wanting to look for patterns. Make notes to go along with the blood glucose readings because you might forget what happened at that exact time. Keeping good records helps you and your health care provider to work together to achieve good diabetes management.    This information is not intended to replace advice given to you by your health care provider. Make sure you discuss any questions you have with your health care provider.   Document Released: 02/10/2003 Document Revised: 02/28/2014 Document Reviewed: 07/02/2012 Elsevier Interactive Patient Education [employer].

## 2015-02-19 NOTE — Telephone Encounter (Signed)
[facility] - Client [facility] Patient Name: JOYLYN BEZIO DOB: 06/20/1932 Initial Comment Caller states her mother's blood sugar was 60 with shakes and weakness, hasn't felt well for days Nurse Assessment Nurse: Markus Daft, RN, Sherre Poot Date/Time (Eastern Time): 02/19/2015 9:03:43 AM Confirm and document reason for call. If symptomatic, describe symptoms. ---Caller states her mother's blood sugar was 60 last night with shakes and weakness. She was able to get her to eat and increased up to 120. Then dropped to 60 again this AM with shakes and weakness. She hasn't felt well for days. Urinating more frequently since yesterday. Denies pain with urination. No fever. Runny nose. - Pt is with her son, Shylee Clere, at her home [phone]. Has the patient traveled out of the country within the last 30 days? ---Not Applicable Does the patient have any new or worsening symptoms? ---Yes Will a triage be completed? ---Yes Related visit to physician within the last 2 weeks? ---No Does the PT have any chronic conditions? (i.e. diabetes, asthma, etc.) ---Yes List chronic conditions. ---NIDDM, CAD, CABG Is this a behavioral health or substance abuse call? ---No Guidelines Guideline Title Affirmed Question Affirmed Notes Diabetes - Low Blood Sugar [1] Morning (before breakfast) blood glucose < 80 mg/dl (4.5 mmol/L) AND [2] more than once in past week Diabetes - High Blood Sugar [1] Blood glucose > 300 mg/dl (16.5 mmol/ l) AND [2] two or more times in a row Final Disposition User Call PCP Now Markus Daft, RN, Windy Comments Blood sugar after eating d/t low blood sugar this AM was 182 at 5 am. And now blood sugar 378. She has not taken her medicine this AM. RN advised that she retest. Retested and blood sugar 328. Warm conf. to office to speak with Md's nurse, Vilma Prader, in office.  She advised that she go to ER or come in now if  available.  Referrals REFERRED TO PCP OFFICE Disagree/Comply: Comply

## 2015-02-19 NOTE — ED Provider Notes (Signed)
CSN: [id number]     Arrival date & time 02/19/15  1214 History   First MD Initiated Contact with Patient 02/19/15 1622     Chief Complaint  Patient presents with  . Urinary Frequency  . Hyperglycemia     (Consider location/radiation/quality/duration/timing/severity/associated sxs/prior Treatment) HPI   81 year old female with history of noninsulin dependent diabetes, CHF, prior stroke, ICD currently on Plavix presenting with multiple complaints.  Patient report for the past 3-4 days she has had generalized fatigue, polyuria, polydipsia, vague upper abdominal discomfort, and having persistent runny nose. Abdominal discomfort is completely resolved. She noticed that her blood sugar has been fluctuating. This morning it was 60. After eating several sugary food product, blood sugar went up to 325. She denies any recent medication changes. She denies any fever, chills, headache, productive cough, chest pain, shortness of breath, back pain, burning urination, focal numbness or weakness, or rash.  Past Medical History  Diagnosis Date  . Diabetes mellitus   . Hyperlipidemia   . Hypertension   . CAD (coronary artery disease)     s/p stent R side 9/08, re stenosis 1/09, s/p angioplasty 1/09 and a stent on 12/09; re-angioplasty 5/11  . CHF (congestive heart failure) ([place])   . Diverticulitis 12/08  . 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 Barrett esophagus     EGD 08/2009, next 2013  . Carotid arterial disease ([facility])     s/p stent R side 9-08, re stenosis 1-09, s/p angioplasty 1-09 and a stent on 12-09, last  angiogram 06-24-2009  . Abnormal CT scan, chest     last 06-08-2009, see report   . ICD (implantable cardiac defibrillator) in place      12-08-- [employer]   . Stroke ([place])   . Collagen vascular disease ([place])   . GERD (gastroesophageal reflux disease)     occ  . Anemia     iron def    Past Surgical History  Procedure Laterality Date  . Arterial bypass surgry      08/08/2000  LIMA  to LAD, SVG to intermediate, SVG to OM2, SVG to distal RCA  . Cardiac defibrillator placement      st judes  . Amputation Left 06/08/2012    Procedure: AMPUTATION RAY;  Surgeon: Newt Minion, MD;  Location: [facility];  Service: Orthopedics;  Laterality: Left;  Left Foot 1st Ray Amputation   Family History  Problem Relation Age of Onset  . Breast cancer Mother 16  . Colon cancer Neg Hx   . Tuberculosis Father    Social History  Substance Use Topics  . Smoking status: Never Smoker   . Smokeless tobacco: Never Used     Comment: never used tobacco  . Alcohol Use: No   OB History    No data available     Review of Systems  All other systems reviewed and are negative.     Allergies  Review of patient's allergies indicates no known allergies.  Home Medications   Prior to Admission medications   Medication Sig Start Date End Date Taking? Authorizing Provider  aspirin 325 MG tablet Take 325 mg by mouth daily.     Historical Provider, MD  atorvastatin (LIPITOR) 40 MG tablet Take 1 tablet (40 mg total) by mouth daily. 10/07/14   Colon Branch, MD  clopidogrel (PLAVIX) 75 MG tablet Take 1 tablet (75 mg total) by mouth daily. 10/07/14   Colon Branch, MD  furosemide (LASIX) 40 MG tablet Take 1  tablet (40 mg total) by mouth daily. 10/07/14   Colon Branch, MD  glimepiride (AMARYL) 1 MG tablet Take 1 tablet (1 mg total) by mouth daily with breakfast. 10/07/14   Colon Branch, MD  glucose blood test strip Check blood sugars twice daily. 01/31/14   Colon Branch, MD  metFORMIN (GLUCOPHAGE) 1000 MG tablet Take 1 tablet (1,000 mg total) by mouth 2 (two) times daily with a meal. 10/07/14   Colon Branch, MD  metoprolol (LOPRESSOR) 50 MG tablet Take 1 tablet (50 mg total) by mouth 2 (two) times daily. 10/07/14   Colon Branch, MD  potassium chloride (K-DUR,KLOR-CON) 10 MEQ tablet Take 1 tablet (10 mEq total) by mouth daily. 10/07/14   Colon Branch, MD  ramipril (ALTACE) 5 MG capsule Take 1 capsule (5 mg total) by mouth daily.  10/07/14   Colon Branch, MD   BP 139/55 mmHg  Pulse 64  Temp(Src) 98.1 F (36.7 C) (Oral)  Resp 20  Ht 5\' 8"  (1.727 m)  Wt 88.451 kg  BMI 29.66 kg/m2  SpO2 98% Physical Exam  Constitutional: She is oriented to person, place, and time. She appears well-developed and well-nourished. No distress.  Elderly Caucasian female appears to be in no acute distress.  HENT:  Head: Atraumatic.  Right Ear: External ear normal.  Left Ear: External ear normal.  Nose: Nose normal.  Mouth/Throat: Oropharynx is clear and moist. No oropharyngeal exudate.  Eyes: Conjunctivae and EOM are normal.  Neck: Neck supple.  No nuchal rigidity  Cardiovascular: Normal rate and regular rhythm.   Pulmonary/Chest: Effort normal and breath sounds normal. She has no rales. She exhibits no tenderness.  Abdominal: Soft. Bowel sounds are normal. There is no tenderness. There is no rebound and no guarding.  Musculoskeletal: She exhibits edema (2+ bilateral pitting edema to lower extremities, chronic.).  Neurological: She is alert and oriented to person, place, and time.  Skin: No rash noted.  Psychiatric: She has a normal mood and affect.  Nursing note and vitals reviewed.   ED Course  Procedures (including critical care time) Labs Review Labs Reviewed  CBC - Abnormal; Notable for the following:    WBC 12.2 (*)    RBC 3.48 (*)    Hemoglobin 9.3 (*)    HCT 28.8 (*)    All other components within normal limits  COMPREHENSIVE METABOLIC PANEL - Abnormal; Notable for the following:    CO2 21 (*)    Glucose, Bld 199 (*)    BUN 72 (*)    Creatinine, Ser 1.85 (*)    Total Protein 6.0 (*)    Albumin 3.2 (*)    ALT 12 (*)    Alkaline Phosphatase 138 (*)    GFR calc non Af Amer 25 (*)    GFR calc Af Amer 29 (*)    All other components within normal limits  CBG MONITORING, ED - Abnormal; Notable for the following:    Glucose-Capillary 185 (*)    All other components within normal limits  URINALYSIS, ROUTINE W  REFLEX MICROSCOPIC (NOT AT [facility])  CBG MONITORING, ED    Imaging Review No results found. I have personally reviewed and evaluated these images and lab results as part of my medical decision-making.   EKG Interpretation None      MDM   Final diagnoses:  Hyperglycemia    BP 162/72 mmHg  Pulse 69  Temp(Src) 98.1 F (36.7 C) (Oral)  Resp 20  Ht 5'  8" (1.727 m)  Wt 88.451 kg  BMI 29.66 kg/m2  SpO2 100%   Patient here with her last fatigue, abdominal pain, polyuria and polydipsia. Symptoms suggest hyperglycemia. Her current CBG is 199. She has normal anion gap. Evidence of renal insufficiency with creatinine 1.85, BUN 72. Mild leukocytosis with WBC 12.2. Mild anemia with hemoglobin 9.3. Currently awaits urinalysis.  7:33 PM Patient came in with initial CBG of 185. Her blood sugar drops down to 40s while in the ED. She is mentating appropriately. Orange juice given, will recheck.    9:00 PM CBG of 185 was initially obtained at 12:00 PM. CBG at 47 was obtained at 7 PM. This is after the patient has not been eating any food for approximate 7 hours. CBG show improved to 102 after patient received some crackers and juice. She did received IV fluid in the ED for her renal insufficiency. Anticipate patient is stable for discharge with close follow-up with PCP for further management of her condition. Currently recommend decreasing Metformin dose from 1000mg  BID to 500mg  BID until pt can f/u with PCP.  Pt and family member agrees with plan.    Domenic Moras, PA-C 02/19/15 2106  Domenic Moras, PA-C 02/19/15 2114  Pattricia Boss, MD 02/21/15 [phone]

## 2015-02-19 NOTE — ED Notes (Signed)
Discharge instructions given voiced understanding

## 2015-02-19 NOTE — ED Notes (Addendum)
Pt has multiple complaints. Reports fatigue, urinary frequency and runny nose. Also reports recent fluctuations to blood sugar and occ abd pains. cbg 185 at triage.

## 2015-03-02 ENCOUNTER — Ambulatory Visit: Payer: Medicare Other

## 2015-03-02 ENCOUNTER — Other Ambulatory Visit: Payer: Medicare Other

## 2015-03-02 ENCOUNTER — Ambulatory Visit: Payer: Medicare Other | Admitting: Hematology & Oncology

## 2015-03-09 NOTE — Telephone Encounter (Signed)
-----   Message -----    From: Marvia Pickles Respus    Sent: 11/07/2014   4:18 PM      To: Colon Branch, MD Subject: Removing from Work Queue                       We have contacted this pt multiple times and left multiple messages in regards to scheduling an appt w/ one of our cardiologists. We have yet to hear back from pt in order to schedule appt. We will now be removing pt's referral from our work queue. If you have any questions, comments, or concerns please contact our office at [phone]. Thank you.    V/r   Clarene Critchley Respus

## 2015-03-26 DIAGNOSIS — L97421 Non-pressure chronic ulcer of left heel and midfoot limited to breakdown of skin: Secondary | ICD-10-CM | POA: Diagnosis not present

## 2015-04-07 ENCOUNTER — Telehealth: Payer: Self-pay | Admitting: *Deleted

## 2015-04-07 ENCOUNTER — Encounter: Payer: Self-pay | Admitting: *Deleted

## 2015-04-07 NOTE — Telephone Encounter (Signed)
Pre-Visit Call completed with patient and chart updated.   Pre-Visit Info documented in Specialty Comments under [employer].    

## 2015-04-08 ENCOUNTER — Encounter: Payer: Medicare Other | Admitting: Internal Medicine

## 2015-04-13 ENCOUNTER — Telehealth: Payer: Self-pay | Admitting: Internal Medicine

## 2015-04-13 NOTE — Telephone Encounter (Signed)
Pt was no show 04/08/15 8:00am for cpe, 1st no show I see, pt completed previsit call with RN 04/07/15, pt has not rescheduled, charge or no charge?

## 2015-04-13 NOTE — Telephone Encounter (Signed)
No Charge, Pt informed [name] [name] when Pre-visit call was completed if it was raining that morning she wouldn't show.

## 2015-04-23 DIAGNOSIS — I87323 Chronic venous hypertension (idiopathic) with inflammation of bilateral lower extremity: Secondary | ICD-10-CM | POA: Diagnosis not present

## 2015-04-23 DIAGNOSIS — L97421 Non-pressure chronic ulcer of left heel and midfoot limited to breakdown of skin: Secondary | ICD-10-CM | POA: Diagnosis not present

## 2015-04-23 DIAGNOSIS — E1142 Type 2 diabetes mellitus with diabetic polyneuropathy: Secondary | ICD-10-CM | POA: Diagnosis not present

## 2015-04-23 DIAGNOSIS — B351 Tinea unguium: Secondary | ICD-10-CM | POA: Diagnosis not present

## 2015-05-21 DIAGNOSIS — B351 Tinea unguium: Secondary | ICD-10-CM | POA: Diagnosis not present

## 2015-05-21 DIAGNOSIS — L97421 Non-pressure chronic ulcer of left heel and midfoot limited to breakdown of skin: Secondary | ICD-10-CM | POA: Diagnosis not present

## 2015-05-21 DIAGNOSIS — E1142 Type 2 diabetes mellitus with diabetic polyneuropathy: Secondary | ICD-10-CM | POA: Diagnosis not present

## 2015-06-04 DIAGNOSIS — L02612 Cutaneous abscess of left foot: Secondary | ICD-10-CM | POA: Diagnosis not present

## 2015-06-04 DIAGNOSIS — I87323 Chronic venous hypertension (idiopathic) with inflammation of bilateral lower extremity: Secondary | ICD-10-CM | POA: Diagnosis not present

## 2015-06-19 ENCOUNTER — Encounter: Payer: Self-pay | Admitting: Internal Medicine

## 2015-07-14 ENCOUNTER — Other Ambulatory Visit: Payer: Self-pay

## 2015-07-14 MED ORDER — CLOPIDOGREL BISULFATE 75 MG PO TABS: 75.0000 mg | ORAL_TABLET | Freq: Every day | ORAL | Status: DC

## 2015-07-14 MED ORDER — ATORVASTATIN CALCIUM 40 MG PO TABS: 40.0000 mg | ORAL_TABLET | Freq: Every day | ORAL | Status: DC

## 2015-07-14 MED ORDER — METOPROLOL TARTRATE 50 MG PO TABS: 50.0000 mg | ORAL_TABLET | Freq: Two times a day (BID) | ORAL | Status: DC

## 2015-07-18 ENCOUNTER — Encounter (HOSPITAL_COMMUNITY): Payer: Self-pay

## 2015-07-18 ENCOUNTER — Emergency Department (HOSPITAL_COMMUNITY): Payer: Medicare Other

## 2015-07-18 ENCOUNTER — Emergency Department (HOSPITAL_COMMUNITY)
Admission: EM | Admit: 2015-07-18 | Discharge: 2015-07-18 | Disposition: A | Discharge status: Home / Self Care | Payer: Medicare Other | Attending: Emergency Medicine | Admitting: Emergency Medicine

## 2015-07-18 DIAGNOSIS — I509 Heart failure, unspecified: Secondary | ICD-10-CM | POA: Insufficient documentation

## 2015-07-18 DIAGNOSIS — Z7984 Long term (current) use of oral hypoglycemic drugs: Secondary | ICD-10-CM | POA: Insufficient documentation

## 2015-07-18 DIAGNOSIS — E785 Hyperlipidemia, unspecified: Secondary | ICD-10-CM | POA: Diagnosis not present

## 2015-07-18 DIAGNOSIS — I2510 Atherosclerotic heart disease of native coronary artery without angina pectoris: Secondary | ICD-10-CM | POA: Insufficient documentation

## 2015-07-18 DIAGNOSIS — M25512 Pain in left shoulder: Secondary | ICD-10-CM | POA: Diagnosis not present

## 2015-07-18 DIAGNOSIS — I110 Hypertensive heart disease with heart failure: Secondary | ICD-10-CM | POA: Diagnosis not present

## 2015-07-18 DIAGNOSIS — R072 Precordial pain: Secondary | ICD-10-CM | POA: Diagnosis not present

## 2015-07-18 DIAGNOSIS — M25511 Pain in right shoulder: Secondary | ICD-10-CM | POA: Insufficient documentation

## 2015-07-18 DIAGNOSIS — R079 Chest pain, unspecified: Secondary | ICD-10-CM | POA: Diagnosis not present

## 2015-07-18 DIAGNOSIS — Z95810 Presence of automatic (implantable) cardiac defibrillator: Secondary | ICD-10-CM | POA: Insufficient documentation

## 2015-07-18 DIAGNOSIS — Z791 Long term (current) use of non-steroidal anti-inflammatories (NSAID): Secondary | ICD-10-CM | POA: Insufficient documentation

## 2015-07-18 DIAGNOSIS — M25519 Pain in unspecified shoulder: Secondary | ICD-10-CM | POA: Diagnosis not present

## 2015-07-18 DIAGNOSIS — E119 Type 2 diabetes mellitus without complications: Secondary | ICD-10-CM | POA: Insufficient documentation

## 2015-07-18 DIAGNOSIS — Z8673 Personal history of transient ischemic attack (TIA), and cerebral infarction without residual deficits: Secondary | ICD-10-CM | POA: Insufficient documentation

## 2015-07-18 DIAGNOSIS — Z7982 Long term (current) use of aspirin: Secondary | ICD-10-CM | POA: Diagnosis not present

## 2015-07-18 LAB — I-STAT TROPONIN, ED
TROPONIN I, POC: 0 ng/mL (ref 0.00–0.08)
TROPONIN I, POC: 0.06 ng/mL (ref 0.00–0.08)

## 2015-07-18 LAB — CBC
HCT: 28.7 % — ABNORMAL LOW (ref 36.0–46.0)
HEMOGLOBIN: 8.9 g/dL — AB (ref 12.0–15.0)
MCH: 25.4 pg — AB (ref 26.0–34.0)
MCHC: 31 g/dL (ref 30.0–36.0)
MCV: 82 fL (ref 78.0–100.0)
PLATELETS: 165 10*3/uL (ref 150–400)
RBC: 3.5 MIL/uL — AB (ref 3.87–5.11)
RDW: 14.1 % (ref 11.5–15.5)
WBC: 10 10*3/uL (ref 4.0–10.5)

## 2015-07-18 LAB — BASIC METABOLIC PANEL
ANION GAP: 14 (ref 5–15)
BUN: 31 mg/dL — ABNORMAL HIGH (ref 6–20)
CALCIUM: 8.4 mg/dL — AB (ref 8.9–10.3)
CHLORIDE: 98 mmol/L — AB (ref 101–111)
CO2: 23 mmol/L (ref 22–32)
Glucose, Bld: 243 mg/dL — ABNORMAL HIGH (ref 65–99)
Potassium: 3.6 mmol/L (ref 3.5–5.1)
SODIUM: 135 mmol/L (ref 135–145)

## 2015-07-18 MED ORDER — NITROGLYCERIN 0.4 MG SL SUBL: 0.4000 mg | SUBLINGUAL_TABLET | SUBLINGUAL | Status: DC | PRN

## 2015-07-18 NOTE — Discharge Instructions (Signed)
It was our pleasure to provide your ER care today - we hope that you feel better.  Follow up with your doctor/cardiologist this coming week - call office Tuesday morning to arrange appointment.  Return to ER right away if worse, recurrent or persistent chest pain, trouble breathing, weak/faint, other concern.    Nonspecific Chest Pain  Chest pain can be caused by many different conditions. There is always a chance that your pain could be related to something serious, such as a heart attack or a blood clot in your lungs. Chest pain can also be caused by conditions that are not life-threatening. If you have chest pain, it is very important to follow up with your health care provider. CAUSES  Chest pain can be caused by:  Heartburn.  Pneumonia or bronchitis.  Anxiety or stress.  Inflammation around your heart (pericarditis) or lung (pleuritis or pleurisy).  A blood clot in your lung.  A collapsed lung (pneumothorax). It can develop suddenly on its own (spontaneous pneumothorax) or from trauma to the chest.  Shingles infection (varicella-zoster virus).  Heart attack.  Damage to the bones, muscles, and cartilage that make up your chest wall. This can include:  Bruised bones due to injury.  Strained muscles or cartilage due to frequent or repeated coughing or overwork.  Fracture to one or more ribs.  Sore cartilage due to inflammation (costochondritis). RISK FACTORS  Risk factors for chest pain may include:  Activities that increase your risk for trauma or injury to your chest.  Respiratory infections or conditions that cause frequent coughing.  Medical conditions or overeating that can cause heartburn.  Heart disease or family history of heart disease.  Conditions or health behaviors that increase your risk of developing a blood clot.  Having had chicken pox (varicella zoster). SIGNS AND SYMPTOMS Chest pain can feel like:  Burning or tingling on the surface of your  chest or deep in your chest.  Crushing, pressure, aching, or squeezing pain.  Dull or sharp pain that is worse when you move, cough, or take a deep breath.  Pain that is also felt in your back, neck, shoulder, or arm, or pain that spreads to any of these areas. Your chest pain may come and go, or it may stay constant. DIAGNOSIS Lab tests or other studies may be needed to find the cause of your pain. Your health care provider may have you take a test called an ambulatory ECG (electrocardiogram). An ECG records your heartbeat patterns at the time the test is performed. You may also have other tests, such as:  Transthoracic echocardiogram (TTE). During echocardiography, sound waves are used to create a picture of all of the heart structures and to look at how blood flows through your heart.  Transesophageal echocardiogram (TEE).This is a more advanced imaging test that obtains images from inside your body. It allows your health care provider to see your heart in finer detail.  Cardiac monitoring. This allows your health care provider to monitor your heart rate and rhythm in real time.  Holter monitor. This is a portable device that records your heartbeat and can help to diagnose abnormal heartbeats. It allows your health care provider to track your heart activity for several days, if needed.  Stress tests. These can be done through exercise or by taking medicine that makes your heart beat more quickly.  Blood tests.  Imaging tests. TREATMENT  Your treatment depends on what is causing your chest pain. Treatment may include:  Medicines. These  may include:  Acid blockers for heartburn.  Anti-inflammatory medicine.  Pain medicine for inflammatory conditions.  Antibiotic medicine, if an infection is present.  Medicines to dissolve blood clots.  Medicines to treat coronary artery disease.  Supportive care for conditions that do not require medicines. This may  include:  Resting.  Applying heat or cold packs to injured areas.  Limiting activities until pain decreases. HOME CARE INSTRUCTIONS  If you were prescribed an antibiotic medicine, finish it all even if you start to feel better.  Avoid any activities that bring on chest pain.  Do not use any tobacco products, including cigarettes, chewing tobacco, or electronic cigarettes. If you need help quitting, ask your health care provider.  Do not drink alcohol.  Take medicines only as directed by your health care provider.  Keep all follow-up visits as directed by your health care provider. This is important. This includes any further testing if your chest pain does not go away.  If heartburn is the cause for your chest pain, you may be told to keep your head raised (elevated) while sleeping. This reduces the chance that acid will go from your stomach into your esophagus.  Make lifestyle changes as directed by your health care provider. These may include:  Getting regular exercise. Ask your health care provider to suggest some activities that are safe for you.  Eating a heart-healthy diet. A registered dietitian can help you to learn healthy eating options.  Maintaining a healthy weight.  Managing diabetes, if necessary.  Reducing stress. SEEK MEDICAL CARE IF:  Your chest pain does not go away after treatment.  You have a rash with blisters on your chest.  You have a fever. SEEK IMMEDIATE MEDICAL CARE IF:   Your chest pain is worse.  You have an increasing cough, or you cough up blood.  You have severe abdominal pain.  You have severe weakness.  You faint.  You have chills.  You have sudden, unexplained chest discomfort.  You have sudden, unexplained discomfort in your arms, back, neck, or jaw.  You have shortness of breath at any time.  You suddenly start to sweat, or your skin gets clammy.  You feel nauseous or you vomit.  You suddenly feel light-headed or  dizzy.  Your heart begins to beat quickly, or it feels like it is skipping beats. These symptoms may represent a serious problem that is an emergency. Do not wait to see if the symptoms will go away. Get medical help right away. Call your local emergency services (911 in the [place] Do not drive yourself to the hospital.   This information is not intended to replace advice given to you by your health care provider. Make sure you discuss any questions you have with your health care provider.   Document Released: 11/17/2004 Document Revised: 02/28/2014 Document Reviewed: 09/13/2013 Elsevier Interactive Patient Education [employer].

## 2015-07-18 NOTE — ED Provider Notes (Addendum)
CSN: [id number]     Arrival date & time 07/18/15  0845 History   First MD Initiated Contact with Patient 07/18/15 [phone]     Chief Complaint  Patient presents with  . Shoulder Pain  . Chest Pain     (Consider location/radiation/quality/duration/timing/severity/associated sxs/prior Treatment) Patient is a 82 y.o. female presenting with shoulder pain and chest pain. The history is provided by the patient.  Shoulder Pain Associated symptoms: no back pain, no fever and no neck pain   Chest Pain Associated symptoms: no abdominal pain, no back pain, no cough, no fever, no headache, no shortness of breath and not vomiting   Patient with hx cad, cabg, presents c/o aching feeling bilateral shoulder, this AM, at rest. Was mild, persistent, without specific exacerbating or alleviating factors. Lasted approximately 1 hour, resolved now.  Denies any associated sob, nv, or diaphoresis. Patient states was worried as was similar to prior cardiac chest pain.  States since bypass, no recurrent problems with chest pain. No other recent cp or discomfort, even w exertion. No unusual doe or fatigue.  No pleuritic pain. Feels breathing at baseline.  Patient denies cough or uri c/o. No reflux symptoms.       Past Medical History  Diagnosis Date  . Diabetes mellitus   . Hyperlipidemia   . Hypertension   . CAD (coronary artery disease)     s/p stent R side 9/08, re stenosis 1/09, s/p angioplasty 1/09 and a stent on 12/09; re-angioplasty 5/11  . CHF (congestive heart failure) ([place])   . Diverticulitis 12/08  . 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 Barrett esophagus     EGD 08/2009, next 2013  . Carotid arterial disease ([facility])     s/p stent R side 9-08, re stenosis 1-09, s/p angioplasty 1-09 and a stent on 12-09, last  angiogram 06-24-2009  . Abnormal CT scan, chest     last 06-08-2009, see report   . ICD (implantable cardiac defibrillator) in place      12-08-- [employer]   . Stroke ([place])   . Collagen  vascular disease ([place])   . GERD (gastroesophageal reflux disease)     occ  . Anemia     iron def    Past Surgical History  Procedure Laterality Date  . Arterial bypass surgry      08/08/2000  LIMA to LAD, SVG to intermediate, SVG to OM2, SVG to distal RCA  . Cardiac defibrillator placement      st judes  . Amputation Left 06/08/2012    Procedure: AMPUTATION RAY;  Surgeon: Newt Minion, MD;  Location: [facility];  Service: Orthopedics;  Laterality: Left;  Left Foot 1st Ray Amputation   Family History  Problem Relation Age of Onset  . Breast cancer Mother 84  . Colon cancer Neg Hx   . Tuberculosis Father    Social History  Substance Use Topics  . Smoking status: Never Smoker   . Smokeless tobacco: Never Used     Comment: never used tobacco  . Alcohol Use: No   OB History    No data available     Review of Systems  Constitutional: Negative for fever and chills.  HENT: Negative for sore throat.   Eyes: Negative for redness.  Respiratory: Negative for cough and shortness of breath.   Cardiovascular: Positive for chest pain.       Chronic lower leg swelling, no recent/acute change.   Gastrointestinal: Negative for vomiting, abdominal pain and diarrhea.  Genitourinary: Negative for flank pain.  Musculoskeletal: Negative for back pain and neck pain.  Skin: Negative for rash.  Neurological: Negative for headaches.  Hematological: Does not bruise/bleed easily.  Psychiatric/Behavioral: Negative for confusion.      Allergies  Review of patient's allergies indicates no known allergies.  Home Medications   Prior to Admission medications   Medication Sig Start Date End Date Taking? Authorizing Provider  aspirin 325 MG tablet Take 325 mg by mouth daily.    Yes Historical Provider, MD  atorvastatin (LIPITOR) 40 MG tablet Take 1 tablet (40 mg total) by mouth daily. 07/14/15  Yes Colon Branch, MD  clopidogrel (PLAVIX) 75 MG tablet Take 1 tablet (75 mg total) by mouth daily. 07/14/15   Yes Colon Branch, MD  docusate sodium (COLACE) 100 MG capsule Take 100 mg by mouth daily as needed for mild constipation.   Yes Historical Provider, MD  furosemide (LASIX) 40 MG tablet Take 1 tablet (40 mg total) by mouth daily. 10/07/14  Yes Colon Branch, MD  glucose blood test strip Check blood sugars twice daily. 01/31/14  Yes Colon Branch, MD  ibuprofen (ADVIL,MOTRIN) 800 MG tablet Take 800 mg by mouth every 8 (eight) hours as needed for mild pain.   Yes Historical Provider, MD  metFORMIN (GLUCOPHAGE) 1000 MG tablet Take 1 tablet (1,000 mg total) by mouth 2 (two) times daily with a meal. Patient taking differently: Take 500 mg by mouth 2 (two) times daily with a meal.  10/07/14  Yes Colon Branch, MD  metoprolol (LOPRESSOR) 50 MG tablet Take 1 tablet (50 mg total) by mouth 2 (two) times daily. 07/14/15  Yes Colon Branch, MD  potassium chloride (K-DUR,KLOR-CON) 10 MEQ tablet Take 1 tablet (10 mEq total) by mouth daily. 10/07/14  Yes Colon Branch, MD  ramipril (ALTACE) 5 MG capsule Take 1 capsule (5 mg total) by mouth daily. 10/07/14  Yes Colon Branch, MD  glimepiride (AMARYL) 1 MG tablet Take 1 tablet (1 mg total) by mouth daily with breakfast. 10/07/14   Colon Branch, MD   BP 148/58 mmHg  Pulse 64  Temp(Src) 98 F (36.7 C) (Oral)  Resp 16  SpO2 100% Physical Exam  Constitutional: She appears well-developed and well-nourished. No distress.  HENT:  Mouth/Throat: Oropharynx is clear and moist.  Eyes: Conjunctivae are normal. No scleral icterus.  Neck: Neck supple. No tracheal deviation present.  Cardiovascular: Normal rate, regular rhythm, normal heart sounds and intact distal pulses.  Exam reveals no gallop and no friction rub.   No murmur heard. Pulmonary/Chest: Effort normal and breath sounds normal. No respiratory distress.  Abdominal: Soft. Normal appearance and bowel sounds are normal. She exhibits no distension. There is no tenderness.  Musculoskeletal:  Bilateral lower leg edema/chronic per patient.    Neurological: She is alert.  Skin: Skin is warm and dry. No rash noted. She is not diaphoretic.  Psychiatric: She has a normal mood and affect.  Nursing note and vitals reviewed.   ED Course  Procedures (including critical care time) Labs Review   Results for orders placed or performed during the hospital encounter of [id number]  Basic metabolic panel  Result Value Ref Range   Sodium 135 135 - 145 mmol/L   Potassium 3.6 3.5 - 5.1 mmol/L   Chloride 98 (L) 101 - 111 mmol/L   CO2 23 22 - 32 mmol/L   Glucose, Bld 243 (H) 65 - 99 mg/dL   BUN 31 (H)  6 - 20 mg/dL   Creatinine, Ser <0.30 (L) 0.44 - 1.00 mg/dL   Calcium 8.4 (L) 8.9 - 10.3 mg/dL   GFR calc non Af Amer NOT CALCULATED >60 mL/min   GFR calc Af Amer NOT CALCULATED >60 mL/min   Anion gap 14 5 - 15  CBC  Result Value Ref Range   WBC 10.0 4.0 - 10.5 K/uL   RBC 3.50 (L) 3.87 - 5.11 MIL/uL   Hemoglobin 8.9 (L) 12.0 - 15.0 g/dL   HCT 28.7 (L) 36.0 - 46.0 %   MCV 82.0 78.0 - 100.0 fL   MCH 25.4 (L) 26.0 - 34.0 pg   MCHC 31.0 30.0 - 36.0 g/dL   RDW 14.1 11.5 - 15.5 %   Platelets 165 150 - 400 K/uL  I-stat troponin, ED  Result Value Ref Range   Troponin i, poc 0.00 0.00 - 0.08 ng/mL   Comment 3          I-stat troponin, ED  Result Value Ref Range   Troponin i, poc 0.06 0.00 - 0.08 ng/mL   Comment 3           Dg Chest 2 View  07/18/2015  CLINICAL DATA:  Chest pain. EXAM: CHEST  2 VIEW COMPARISON:  December 18, 2012. FINDINGS: Stable cardiomediastinal silhouette. Status post coronary artery bypass graft. No pneumothorax or pleural effusion is noted. Left-sided pacemaker is unchanged in position. Right lung is clear. Stable left retrocardiac density is noted consistent with rounded atelectasis as described on prior exam. Bony thorax is intact. IMPRESSION: Stable probable rounded atelectasis seen in left retrocardiac region. No other significant abnormality seen in the chest. Electronically Signed   By: Marijo Conception, [name]   On:  07/18/2015 09:24       I have personally reviewed and evaluated these images and lab results as part of my medical decision-making.   EKG Interpretation   Date/Time:  Saturday Jul 18 2015 08:47:20 EDT Ventricular Rate:  70 PR Interval:  201 QRS Duration: 163 QT Interval:  456 QTC Calculation: 492 R Axis:   44 Text Interpretation:  Sinus rhythm Nonspecific intraventricular conduction  delay Nonspecific T wave abnormality No significant change since last  tracing Confirmed by Sargent Mankey  MD, Lennette Bihari ([id number]) on 07/18/2015 9:03:53 AM      MDM   Iv ns. Labs.  Reviewed nursing notes and prior charts for additional history.   Patient remains symptom free.  Initial trop 0. Repeat trop .06.    Given new chest pain that patient feels is similar/same as prior cardiac cp, mild increase in trop on repeat (although still in normal range), will admit for cp eval.  Pt remains cp free on recheck.  Patient declines admit. Discussed risks/recommendation.  Inquired again as to whether willing to stay for admission.  Patient again requests d/c to home.  Pt to go home, d/c against med advice/recommendation.     Family member/pt request new rx for ntg.       Lajean Saver, MD 07/18/15 [phone]

## 2015-07-18 NOTE — ED Notes (Addendum)
Pt. BIB [facility] for evaluation of bilateral shoulder pain and generalized chest aching starting this AM. Pt. Reports she got up early this AM and ate some cereal, shortly after she went back to bed. When she woke up again, she had some bilateral arm aching and generalized CP. Pt. Denies pain at this time. Pt. With hx of MI, family requesting transport. VSS. AxO x4. Pt. Given 324 ASA and 1 Nitro PTA

## 2015-07-23 ENCOUNTER — Other Ambulatory Visit: Payer: Self-pay

## 2015-07-23 DIAGNOSIS — E1142 Type 2 diabetes mellitus with diabetic polyneuropathy: Secondary | ICD-10-CM | POA: Diagnosis not present

## 2015-07-23 DIAGNOSIS — I87323 Chronic venous hypertension (idiopathic) with inflammation of bilateral lower extremity: Secondary | ICD-10-CM | POA: Diagnosis not present

## 2015-07-23 DIAGNOSIS — L02612 Cutaneous abscess of left foot: Secondary | ICD-10-CM | POA: Diagnosis not present

## 2015-07-23 DIAGNOSIS — B351 Tinea unguium: Secondary | ICD-10-CM | POA: Diagnosis not present

## 2015-07-23 MED ORDER — RAMIPRIL 5 MG PO CAPS: 5.0000 mg | ORAL_CAPSULE | Freq: Every day | ORAL | Status: DC

## 2015-08-04 ENCOUNTER — Telehealth: Payer: Self-pay | Admitting: Internal Medicine

## 2015-08-04 MED ORDER — FUROSEMIDE 40 MG PO TABS: 40.0000 mg | ORAL_TABLET | Freq: Every day | ORAL | Status: DC

## 2015-08-04 NOTE — Telephone Encounter (Signed)
Pt called in to request a refill on LASIX.   Pharmacy: [facility] [address] - [place], [place] [address]   [phone]

## 2015-08-04 NOTE — Telephone Encounter (Signed)
30 day supply sent. Please inform Pt that she will need an OV for further refills. She has not been seen since 09/2014, and has been seen twice at the ED since then. Please allow 30 minutes. Thank you.

## 2015-08-05 NOTE — Telephone Encounter (Signed)
Tried calling pt back to inform of the below (4) times. Unable to get through. Busy signal.

## 2015-08-12 ENCOUNTER — Other Ambulatory Visit: Payer: Self-pay

## 2015-08-12 MED ORDER — METOPROLOL TARTRATE 50 MG PO TABS: 50.0000 mg | ORAL_TABLET | Freq: Two times a day (BID) | ORAL | Status: DC

## 2015-08-12 MED ORDER — CLOPIDOGREL BISULFATE 75 MG PO TABS: 75.0000 mg | ORAL_TABLET | Freq: Every day | ORAL | Status: DC

## 2015-08-12 MED ORDER — POTASSIUM CHLORIDE CRYS ER 10 MEQ PO TBCR: 10.0000 meq | EXTENDED_RELEASE_TABLET | Freq: Every day | ORAL | Status: DC

## 2015-08-12 MED ORDER — ATORVASTATIN CALCIUM 40 MG PO TABS: 40.0000 mg | ORAL_TABLET | Freq: Every day | ORAL | Status: DC

## 2015-08-28 DIAGNOSIS — E1142 Type 2 diabetes mellitus with diabetic polyneuropathy: Secondary | ICD-10-CM | POA: Diagnosis not present

## 2015-08-28 DIAGNOSIS — I87323 Chronic venous hypertension (idiopathic) with inflammation of bilateral lower extremity: Secondary | ICD-10-CM | POA: Diagnosis not present

## 2015-08-28 DIAGNOSIS — B351 Tinea unguium: Secondary | ICD-10-CM | POA: Diagnosis not present

## 2015-08-28 DIAGNOSIS — L97421 Non-pressure chronic ulcer of left heel and midfoot limited to breakdown of skin: Secondary | ICD-10-CM | POA: Diagnosis not present

## 2015-09-01 ENCOUNTER — Telehealth: Payer: Self-pay | Admitting: Internal Medicine

## 2015-09-01 NOTE — Telephone Encounter (Signed)
Relation to [id number] Call back [phone] Pharmacy:  Reason for call:  Patient states she's out of all her medication requesting a refill, requested patient to elaborate and she stated i would have to call the pharmacy. Please advise

## 2015-09-01 NOTE — Telephone Encounter (Signed)
Pt has not been seen since 09/2014, however, she has CPE scheduled in several weeks. Will need to know which medications she is needing refilled and I will send a 30 day supply.

## 2015-09-02 MED ORDER — METFORMIN HCL 1000 MG PO TABS: 500.0000 mg | ORAL_TABLET | Freq: Two times a day (BID) | ORAL | Status: DC

## 2015-09-02 MED ORDER — CLOPIDOGREL BISULFATE 75 MG PO TABS: 75.0000 mg | ORAL_TABLET | Freq: Every day | ORAL | Status: DC

## 2015-09-02 MED ORDER — FUROSEMIDE 40 MG PO TABS: 40.0000 mg | ORAL_TABLET | Freq: Every day | ORAL | Status: DC

## 2015-09-02 MED ORDER — POTASSIUM CHLORIDE CRYS ER 10 MEQ PO TBCR: 10.0000 meq | EXTENDED_RELEASE_TABLET | Freq: Every day | ORAL | Status: DC

## 2015-09-02 MED ORDER — ATORVASTATIN CALCIUM 40 MG PO TABS: 40.0000 mg | ORAL_TABLET | Freq: Every day | ORAL | Status: DC

## 2015-09-02 MED ORDER — METOPROLOL TARTRATE 50 MG PO TABS: 50.0000 mg | ORAL_TABLET | Freq: Two times a day (BID) | ORAL | Status: DC

## 2015-09-02 NOTE — Telephone Encounter (Signed)
Daughter called stating that patient is in need of the following:  Patient needs Plavix, Lipitor, Lasix, Metformin, Metoprolol, and Potassium Chloride

## 2015-09-02 NOTE — Telephone Encounter (Signed)
Requested Rx's sent to [facility] for 30 day supply. Further refills will be given w/ OV and labs.

## 2015-09-10 ENCOUNTER — Other Ambulatory Visit: Payer: Self-pay

## 2015-09-10 MED ORDER — GLIMEPIRIDE 1 MG PO TABS: 1.0000 mg | ORAL_TABLET | Freq: Every day | ORAL | Status: DC

## 2015-09-17 ENCOUNTER — Telehealth: Payer: Self-pay | Admitting: Internal Medicine

## 2015-09-17 MED ORDER — RAMIPRIL 5 MG PO CAPS: 5.0000 mg | ORAL_CAPSULE | Freq: Every day | ORAL | 0 refills | Status: DC

## 2015-09-17 NOTE — Telephone Encounter (Signed)
Relation to [id number] Call back number:[phone] Pharmacy: [place], [place] [address] [phone] (Phone) [phone] (Fax)     Reason for call:  Patient requesting a refill metoprolol (LOPRESSOR) 50 MG tablet, ramipril (ALTACE) 5 MG capsule, glimepiride (AMARYL) 1 MG tablet and clopidogrel (PLAVIX) 75 MG tablet

## 2015-09-17 NOTE — Telephone Encounter (Signed)
Metoprolol refilled on 09/02/2015 #60 and 0RF Glimepiride refilled on 09/10/2015 #30 and 0RF Clopidrogrel refilled on 09/02/2015 #30 and 0RF   Ramipril Rx's sent. Further refills w/ appt on 09/30/2015.

## 2015-09-18 ENCOUNTER — Other Ambulatory Visit: Payer: Self-pay | Admitting: Internal Medicine

## 2015-09-21 ENCOUNTER — Telehealth: Payer: Self-pay

## 2015-09-21 MED ORDER — METOPROLOL TARTRATE 50 MG PO TABS: 50.0000 mg | ORAL_TABLET | Freq: Two times a day (BID) | ORAL | 0 refills | Status: DC

## 2015-09-21 NOTE — Telephone Encounter (Signed)
Spoke w/ [facility], Metoprolol was picked up on 09/02/2015 for #30 and 0RF (15 day supply). Spoke w/ Pt, informed her that I would send in 30 day supply to [facility] and will provide further refills on 09/30/2015 when she see's Dr. Larose Kells. Pt verbalized understanding.

## 2015-09-21 NOTE — Telephone Encounter (Signed)
[name] patient's Company secretary called to say patient is completely out of Metoprolol patient needs a refill sent to [facility] on [address]. She has an appointment 09/30/15 please advise- Patient [place] [phone]

## 2015-09-21 NOTE — Telephone Encounter (Signed)
Metoprolol was sent to [facility] on 09/02/2015.

## 2015-09-21 NOTE — Telephone Encounter (Signed)
Called patient to inform of the message below. Patient said she has called the pharmacy and they are saying it was denied . Patient is out of this medication . Please call the pharmacy to verify that it was received. Patient states unless she get this refill she will be out until her appointment because the pharmacy will not refill it for her

## 2015-09-30 ENCOUNTER — Ambulatory Visit (INDEPENDENT_AMBULATORY_CARE_PROVIDER_SITE_OTHER): Payer: Medicare Other | Admitting: Internal Medicine

## 2015-09-30 ENCOUNTER — Encounter: Payer: Self-pay | Admitting: Internal Medicine

## 2015-09-30 VITALS — BP 128/80 | HR 63 | Temp 97.9°F | Resp 14 | Ht 68.0 in | Wt 188.0 lb

## 2015-09-30 DIAGNOSIS — Z09 Encounter for follow-up examination after completed treatment for conditions other than malignant neoplasm: Secondary | ICD-10-CM | POA: Insufficient documentation

## 2015-09-30 DIAGNOSIS — D649 Anemia, unspecified: Secondary | ICD-10-CM | POA: Diagnosis not present

## 2015-09-30 DIAGNOSIS — E785 Hyperlipidemia, unspecified: Secondary | ICD-10-CM | POA: Diagnosis not present

## 2015-09-30 DIAGNOSIS — E118 Type 2 diabetes mellitus with unspecified complications: Secondary | ICD-10-CM | POA: Diagnosis not present

## 2015-09-30 DIAGNOSIS — I509 Heart failure, unspecified: Secondary | ICD-10-CM

## 2015-09-30 DIAGNOSIS — I10 Essential (primary) hypertension: Secondary | ICD-10-CM

## 2015-09-30 MED ORDER — ATORVASTATIN CALCIUM 40 MG PO TABS: 40.0000 mg | ORAL_TABLET | Freq: Every day | ORAL | 6 refills | Status: DC

## 2015-09-30 MED ORDER — GLIMEPIRIDE 1 MG PO TABS: 1.0000 mg | ORAL_TABLET | Freq: Every day | ORAL | 6 refills | Status: DC

## 2015-09-30 MED ORDER — CLOPIDOGREL BISULFATE 75 MG PO TABS: 75.0000 mg | ORAL_TABLET | Freq: Every day | ORAL | 6 refills | Status: DC

## 2015-09-30 MED ORDER — RAMIPRIL 5 MG PO CAPS: 5.0000 mg | ORAL_CAPSULE | Freq: Every day | ORAL | 6 refills | Status: DC

## 2015-09-30 MED ORDER — POTASSIUM CHLORIDE CRYS ER 10 MEQ PO TBCR: 10.0000 meq | EXTENDED_RELEASE_TABLET | Freq: Every day | ORAL | 6 refills | Status: DC

## 2015-09-30 MED ORDER — NITROGLYCERIN 0.4 MG SL SUBL
0.4000 mg | SUBLINGUAL_TABLET | SUBLINGUAL | 0 refills | Status: DC | PRN
Start: 2015-09-30 — End: 2016-07-21

## 2015-09-30 MED ORDER — FUROSEMIDE 40 MG PO TABS: 40.0000 mg | ORAL_TABLET | Freq: Every day | ORAL | 6 refills | Status: DC

## 2015-09-30 MED ORDER — METFORMIN HCL 1000 MG PO TABS: 500.0000 mg | ORAL_TABLET | Freq: Two times a day (BID) | ORAL | 6 refills | Status: DC

## 2015-09-30 MED ORDER — METOPROLOL TARTRATE 50 MG PO TABS
50.0000 mg | ORAL_TABLET | Freq: Two times a day (BID) | ORAL | 6 refills | Status: DC
Start: 2015-09-30 — End: 2016-05-20

## 2015-09-30 NOTE — Progress Notes (Signed)
Subjective:    Patient ID: [name] [name], female    DOB: 08/04/1932, 82 y.o.   MRN: [id number]  DOS:  09/30/2015 Type of visit - description : Routine follow-up Interval history: Last visit with me 09-2014, will manage chronic problems and do a yearly checkup at the next opportunity  DM: Good compliance of medication, ambulatory blood sugars in the 140s, occasionally it drops to the mid 90s and she feels  slightly weak CAD: ER 06/2015, had chest pain, troponins negative, was recommended admission, patient declined it. No further symptoms. High cholesterol: On Lipitor.   Review of Systems Denies current chest pain, no difficulty breathing. Lower extremity edema at baseline for years Denies nausea, vomiting, diarrhea or blood in the stools. Occasionally takes Advil.   Past Medical History:  Diagnosis Date  . Abnormal CT scan, chest    last 06-08-2009, see report   . Anemia    iron def   . Barrett esophagus    EGD 08/2009, next 2013  . CAD (coronary artery disease)    s/p stent R side 9/08, re stenosis 1/09, s/p angioplasty 1/09 and a stent on 12/09; re-angioplasty 5/11  . Carotid arterial disease (H[facility])    s/p stent R side 9-08, re stenosis 1-09, s/p angioplasty 1-09 and a stent on 12-09, last  angiogram 06-24-2009  . CHF (congestive heart failure) ([place])   . Collagen vascular disease ([place])   . Diabetes mellitus   . Diverticulitis 12/08  . GERD (gastroesophageal reflux disease)    occ  . Hyperlipidemia   . Hypertension   . ICD (implantable cardiac defibrillator) in place     12-08-- [employer]   . 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 Stroke [facility])     Past Surgical History:  Procedure Laterality Date  . AMPUTATION Left 06/08/2012   Procedure: AMPUTATION RAY;  Surgeon: Newt Minion, MD;  Location: [place];  Service: Orthopedics;  Laterality: Left;  Left Foot 1st Ray Amputation  . ARTERIAL BYPASS SURGRY     08/08/2000  LIMA to LAD, SVG to intermediate, SVG to OM2, SVG to  distal RCA  . CARDIAC DEFIBRILLATOR PLACEMENT     [employer]    Social History   Social History  . Marital status: Widowed    Spouse name: N/A  . Number of children: 6  . Years of education: N/A   Occupational History  . n/a    Social History Main Topics  . Smoking status: Never Smoker  . Smokeless tobacco: Never Used     Comment: never used tobacco  . Alcohol use No  . Drug use: No  . Sexual activity: No   Other Topics Concern  . Not on file   Social History Narrative   Widow, a son lives w/ her , somebody stays with her every night   six children   [name], daughter, usually comes w/ her    Doesn't drive anymore, limited  ADLs, uses a walker             Medication List       Accurate as of 09/30/15  6:27 PM. Always use your most recent med list.          acetaminophen 500 MG tablet Commonly known as:  TYLENOL Take 1,000 mg by mouth every 8 (eight) hours as needed.   aspirin 325 MG tablet Take 325 mg by mouth daily.   atorvastatin 40 MG tablet Commonly known as:  LIPITOR Take 1  tablet (40 mg total) by mouth daily.   clopidogrel 75 MG tablet Commonly known as:  PLAVIX Take 1 tablet (75 mg total) by mouth daily.   docusate sodium 100 MG capsule Commonly known as:  COLACE Take 100 mg by mouth daily as needed for mild constipation.   furosemide 40 MG tablet Commonly known as:  LASIX Take 1 tablet (40 mg total) by mouth daily.   glimepiride 1 MG tablet Commonly known as:  AMARYL Take 1 tablet (1 mg total) by mouth daily with breakfast.   glucose blood test strip Check blood sugars twice daily.   metFORMIN 1000 MG tablet Commonly known as:  GLUCOPHAGE Take 0.5 tablets (500 mg total) by mouth 2 (two) times daily with a meal.   metoprolol 50 MG tablet Commonly known as:  LOPRESSOR Take 1 tablet (50 mg total) by mouth 2 (two) times daily.   nitroGLYCERIN 0.4 MG SL tablet Commonly known as:  NITROSTAT Place 1 tablet (0.4 mg total) under the tongue  every 5 (five) minutes as needed for chest pain. Contact your doctor and/or seek medial attention right away if chest pain   potassium chloride 10 MEQ tablet Commonly known as:  K-DUR,KLOR-CON Take 1 tablet (10 mEq total) by mouth daily.   ramipril 5 MG capsule Commonly known as:  ALTACE Take 1 capsule (5 mg total) by mouth daily.          Objective:   Physical Exam BP 128/80 (BP Location: Right Arm, Patient Position: Sitting, Cuff Size: Normal)   Pulse 63   Temp 97.9 F (36.6 C) (Oral)   Resp 14   Ht 5\' 8"  (1.727 m)   Wt 188 lb (85.3 kg)   SpO2 99%   BMI 28.59 kg/m  General:   Well developed, fairly lady, sitting a wheelchair in no distress.  HEENT:  Normocephalic . Face symmetric, atraumatic Lungs:  CTA B Normal respiratory effort, no intercostal retractions, no accessory muscle use. Heart: RRR,  no murmur.  ++/+++ pretibial edema bilaterally  Skin: Not pale. Not jaundice Neurologic:  alert & oriented X3.  Speech normal, gait not tested, sitting a wheelchair. Psych--  Cognition and judgment appear intact.  Cooperative with normal attention span and concentration.  Behavior appropriate. No anxious or depressed appearing.      Assessment & Plan:   Assessment DM, + h/o amputations HTN High cholesterol LE edema since CABG CV: ---CAD, CABG 2002 ---CHF cardiomyopathy ---Carotid disease Anemia, sees hem-onc, last ov 11-2014, multifactorial  Increase LFTs  PLAN: DM: cont  Metformin, glimepiride, CBGs no lower than 90s. Check A1c, no change. History of amputations, reports  sees Dr. Sharol Given regularly. HTN: Continue Altace, Lasix, potassium. Check a BMP High cholesterol: Continue Lipitor, check a FLP CAD: Had chest pain, went to the ER 06/2015, no further sx, declined admission. Will refill NTG, continue aspirin and Plavix . Refer to cardiology although she seemed stable. Carotid artery disease: Had a number of procedures years ago by neuro radiology  interventionist. On aspirin and Plavix. No recent imaging. Anemia: Recent hemoglobin is stable, check iron, ferritin and CBC. Referral back to hematology as needed RTC 3 months

## 2015-09-30 NOTE — Progress Notes (Signed)
Pre visit review using our clinic review tool, if applicable. No additional management support is needed unless otherwise documented below in the visit note. 

## 2015-09-30 NOTE — Patient Instructions (Signed)
GO TO THE LAB : Get the blood work     GO TO THE FRONT DESK Schedule your next appointment for a yearly checkup in 3 months    For aches and pains use Tylenol ;  avoid ibuprofen, naproxen, Advil or similar medications

## 2015-09-30 NOTE — Assessment & Plan Note (Signed)
DM: cont  Metformin, glimepiride, CBGs no lower than 90s. Check A1c, no change. History of amputations, reports  sees Dr. Sharol Given regularly. HTN: Continue Altace, Lasix, potassium. Check a BMP High cholesterol: Continue Lipitor, check a FLP CAD: Had chest pain, went to the ER 06/2015, no further sx, declined admission. Will refill NTG, continue aspirin and Plavix . Refer to cardiology although she seemed stable. Carotid artery disease: Had a number of procedures years ago by neuro radiology interventionist. On aspirin and Plavix. No recent imaging. Anemia: Recent hemoglobin is stable, check iron, ferritin and CBC. Referral back to hematology as needed RTC 3 months

## 2015-10-01 LAB — CBC WITH DIFFERENTIAL/PLATELET
BASOS ABS: 0 10*3/uL (ref 0.0–0.1)
Basophils Relative: 0.4 % (ref 0.0–3.0)
Eosinophils Absolute: 0.2 10*3/uL (ref 0.0–0.7)
Eosinophils Relative: 2.3 % (ref 0.0–5.0)
HCT: 25.5 % — ABNORMAL LOW (ref 36.0–46.0)
Hemoglobin: 8.4 g/dL — ABNORMAL LOW (ref 12.0–15.0)
LYMPHS ABS: 2 10*3/uL (ref 0.7–4.0)
Lymphocytes Relative: 18.9 % (ref 12.0–46.0)
MCHC: 32.8 g/dL (ref 30.0–36.0)
MCV: 78.6 fl (ref 78.0–100.0)
MONO ABS: 0.5 10*3/uL (ref 0.1–1.0)
MONOS PCT: 4.5 % (ref 3.0–12.0)
NEUTROS ABS: 7.8 10*3/uL — AB (ref 1.4–7.7)
NEUTROS PCT: 73.9 % (ref 43.0–77.0)
PLATELETS: 238 10*3/uL (ref 150.0–400.0)
RBC: 3.25 Mil/uL — ABNORMAL LOW (ref 3.87–5.11)
RDW: 15.7 % — ABNORMAL HIGH (ref 11.5–15.5)
WBC: 10.6 10*3/uL — ABNORMAL HIGH (ref 4.0–10.5)

## 2015-10-01 LAB — BASIC METABOLIC PANEL
BUN: 34 mg/dL — ABNORMAL HIGH (ref 6–23)
CALCIUM: 9.2 mg/dL (ref 8.4–10.5)
CHLORIDE: 97 meq/L (ref 96–112)
CO2: 26 meq/L (ref 19–32)
Creatinine, Ser: 1.17 mg/dL (ref 0.40–1.20)
GFR: 47.2 mL/min — ABNORMAL LOW (ref >60.00)
Glucose, Bld: 205 mg/dL — ABNORMAL HIGH (ref 70–99)
POTASSIUM: 5 meq/L (ref 3.5–5.1)
SODIUM: 129 meq/L — AB (ref 135–145)

## 2015-10-01 LAB — LIPID PANEL
CHOL/HDL RATIO: 4
Cholesterol: 122 mg/dL (ref 0–200)
HDL: 30.9 mg/dL — AB (ref >39.00)
LDL CALC: 54 mg/dL (ref 0–99)
NonHDL: 91.23
TRIGLYCERIDES: 186 mg/dL — AB (ref 0.0–149.0)
VLDL: 37.2 mg/dL (ref 0.0–40.0)

## 2015-10-01 LAB — HEMOGLOBIN A1C: Hgb A1c MFr Bld: 7 % — ABNORMAL HIGH (ref 4.6–6.5)

## 2015-10-01 LAB — IRON: Iron: 57 ug/dL (ref 42–145)

## 2015-10-01 LAB — FERRITIN: Ferritin: 94.4 ng/mL (ref 10.0–291.0)

## 2015-10-05 NOTE — Addendum Note (Signed)
Addended byDamita Dunnings D on: 10/05/2015 02:52 PM   Modules accepted: Orders

## 2015-10-08 DIAGNOSIS — L97421 Non-pressure chronic ulcer of left heel and midfoot limited to breakdown of skin: Secondary | ICD-10-CM | POA: Diagnosis not present

## 2015-10-08 DIAGNOSIS — I87323 Chronic venous hypertension (idiopathic) with inflammation of bilateral lower extremity: Secondary | ICD-10-CM | POA: Diagnosis not present

## 2015-10-08 DIAGNOSIS — E1142 Type 2 diabetes mellitus with diabetic polyneuropathy: Secondary | ICD-10-CM | POA: Diagnosis not present

## 2015-10-08 DIAGNOSIS — B351 Tinea unguium: Secondary | ICD-10-CM | POA: Diagnosis not present

## 2015-10-13 ENCOUNTER — Ambulatory Visit: Payer: Medicare Other | Admitting: Family

## 2015-10-13 ENCOUNTER — Ambulatory Visit: Payer: Medicare Other

## 2015-10-13 ENCOUNTER — Other Ambulatory Visit: Payer: Medicare Other

## 2015-11-11 ENCOUNTER — Encounter: Payer: Self-pay | Admitting: Cardiovascular Disease

## 2015-11-11 DIAGNOSIS — I87323 Chronic venous hypertension (idiopathic) with inflammation of bilateral lower extremity: Secondary | ICD-10-CM | POA: Diagnosis not present

## 2015-11-11 DIAGNOSIS — L97421 Non-pressure chronic ulcer of left heel and midfoot limited to breakdown of skin: Secondary | ICD-10-CM | POA: Diagnosis not present

## 2015-11-11 DIAGNOSIS — E1142 Type 2 diabetes mellitus with diabetic polyneuropathy: Secondary | ICD-10-CM | POA: Diagnosis not present

## 2015-11-11 DIAGNOSIS — B351 Tinea unguium: Secondary | ICD-10-CM | POA: Diagnosis not present

## 2015-11-25 ENCOUNTER — Ambulatory Visit: Payer: Medicare Other | Admitting: Cardiovascular Disease

## 2015-12-02 ENCOUNTER — Ambulatory Visit (INDEPENDENT_AMBULATORY_CARE_PROVIDER_SITE_OTHER): Payer: Medicare Other | Admitting: Orthopedic Surgery

## 2015-12-02 ENCOUNTER — Encounter (INDEPENDENT_AMBULATORY_CARE_PROVIDER_SITE_OTHER): Payer: Self-pay

## 2015-12-02 DIAGNOSIS — I87323 Chronic venous hypertension (idiopathic) with inflammation of bilateral lower extremity: Secondary | ICD-10-CM | POA: Diagnosis not present

## 2015-12-02 DIAGNOSIS — E1142 Type 2 diabetes mellitus with diabetic polyneuropathy: Secondary | ICD-10-CM

## 2015-12-30 ENCOUNTER — Ambulatory Visit (INDEPENDENT_AMBULATORY_CARE_PROVIDER_SITE_OTHER): Payer: Medicare Other | Admitting: Orthopedic Surgery

## 2015-12-30 ENCOUNTER — Encounter (INDEPENDENT_AMBULATORY_CARE_PROVIDER_SITE_OTHER): Payer: Self-pay | Admitting: Orthopedic Surgery

## 2015-12-30 VITALS — Ht 68.0 in | Wt 188.0 lb

## 2015-12-30 DIAGNOSIS — L97929 Non-pressure chronic ulcer of unspecified part of left lower leg with unspecified severity: Secondary | ICD-10-CM

## 2015-12-30 DIAGNOSIS — I87332 Chronic venous hypertension (idiopathic) with ulcer and inflammation of left lower extremity: Secondary | ICD-10-CM | POA: Diagnosis not present

## 2015-12-30 DIAGNOSIS — L97411 Non-pressure chronic ulcer of right heel and midfoot limited to breakdown of skin: Secondary | ICD-10-CM | POA: Diagnosis not present

## 2015-12-30 MED ORDER — MUPIROCIN 2 % EX OINT: 1.0000 "application " | TOPICAL_OINTMENT | Freq: Two times a day (BID) | CUTANEOUS | 6 refills | Status: DC

## 2015-12-30 MED ORDER — SULFAMETHOXAZOLE-TRIMETHOPRIM 800-160 MG PO TABS: 1.0000 | ORAL_TABLET | Freq: Two times a day (BID) | ORAL | 0 refills | Status: DC

## 2015-12-30 NOTE — Progress Notes (Signed)
Wound Care Note   Patient: [name] [name]           Date of Birth: 07/20/1932           MRN: [id number]             PCP: Kathlene November, MD Visit Date: 12/30/2015   Assessment & Plan: Visit Diagnoses:  1. Midfoot skin ulcer, right, limited to breakdown of skin ([place])   2. Idiopathic chronic venous hypertension of left lower extremity with ulcer and inflammation (HCC)     Plan: Discussed with the patient a critical nature of elevating her leg. She is to keep her leg elevated level with her heart. We will start her back on antibiotics with Bactrim DS she states she did have some GI upset with taking the doxycycline we will refill her prescription for Bactroban. She is to keep her foot elevated use Dial soap cleansing daily Bactroban ointment dressing changes daily with an Ace wrap. The ulcer dorsally over the ankle has essentially completely healed. Discussed the critical nature of wound healing elevation and wound care. Discussed without proper care she is at risk of loss of her foot due to potential bony infection.  Follow-Up Instructions: Return in about 2 weeks (around 01/13/2016).  Orders:  No orders of the defined types were placed in this encounter.  No orders of the defined types were placed in this encounter.     Procedures: No notes on file  After informed consent a 10 blade knife was used to debride the skin and soft tissue over the medial midfoot ulcer on the left foot status post first ray amputation. The ulcer was debrided of skin and soft tissue as well as tendon with a 10 blade knife and a Ronjair. This was debrided back to bleeding viable granulation tissue silver nitrate was used for hemostasis this did not probe to bone. Iodosorb and 4 x 4 and Ace wrap was applied. Clinical Data: No additional findings.   No images are attached to the encounter.   Subjective: Chief Complaint  Patient presents with  . Left Lower Leg - Follow-up  . Follow-up    venous stasis  insufficiency left leg with venous ulceration    Patient presents for 2 week follow up left lower extremity venous stasis insufficiency and lymphedema. Patient finished bactrim oral antibiotic. She is having redness, swelling, feels like her foot is worsening. There is drainage with odor. She is having an increase in pain and soreness. She is wearing Vive compression stocking. She is nonweightbearing in wheelchair.   Patient states that she has not been elevating her leg like she is supposed to.  Review of Systems  Miscellaneous:  -Home Health Care: N/A  -Physical Therapy: N/A  -Out of Work?: N/A  -Worker's Compensation Case?: N/A  -Additional Information: N/A   Objective: Vital Signs: Ht 5\' 8"  (1.727 m)   Wt 188 lb (85.3 kg)   BMI 28.59 kg/m   Physical Exam: Patient is alert oriented no adenopathy well-dressed normal affect normal respiratory effort.  Patient is a lady wheelchair. Examination she has cellulitis about 3 cm in diameter around the medial left foot wound. She has massive pitting edema in the foot and ankle and leg with dermatitis in the calf the dorsal ankle ulcers have almost completely healed. There is no red streaking up her leg. There is some clear serous sanguinous drainage.  Specialty Comments: No specialty comments available.   PMFS History: Patient Active Problem List  Diagnosis Date Noted  . PCP NOTES >>>>>>>>>>>>>>>>>>>>> 09/30/2015  . Annual physical exam 08/28/2013  . Elevated alkaline phosphatase level 12/19/2012  . UTI (urinary tract infection) 11/20/2012  . Unspecified constipation 07/11/2012  . Diabetic foot ulcer- LEFT-s/p amputation first Ray 2014 Dr. Sharol Given 04/11/2012  . Coronary artery disease 09/05/2010  . GERD (gastroesophageal reflux disease) 07/01/2010  . Anemia, iron deficiency 11/06/2009  . CT, CHEST, ABNORMAL 06/10/2009  . CARDIOMYOPATHY, ISCHEMIC 04/14/2009  . Chronic systolic heart failure ([place]) 04/14/2009  . AUTOMATIC IMPLANTABLE  CARDIAC DEFIBRILLATOR SITU 04/14/2009  . DIVERTICULOSIS OF COLON 07/18/2008  . LIVER FUNCTION TESTS, ABNORMAL, HX OF 07/18/2008  . INSOMNIA-SLEEP DISORDER-UNSPEC 11/05/2007  . GAIT DISTURBANCE 06/07/2007  . CHOLELITHIASIS 03/09/2007  . Carotid artery disease ([place]) 02/12/2007  . DM II (diabetes mellitus, type II), controlled ([place]) 10/27/2006  . Dyslipidemia 10/27/2006  . HTN (hypertension) 10/27/2006   Past Medical History:  Diagnosis Date  . Abnormal CT scan, chest    last 06-08-2009, see report   . Anemia    iron def   . Barrett esophagus    EGD 08/2009, next 2013  . CAD (coronary artery disease)    s/p stent R side 9/08, re stenosis 1/09, s/p angioplasty 1/09 and a stent on 12/09; re-angioplasty 5/11  . Carotid arterial disease (HCC)    s/p stent R side 9-08, re stenosis 1-09, s/p angioplasty 1-09 and a stent on 12-09, last  angiogram 06-24-2009  . CHF (congestive heart failure) ([place])   . Collagen vascular disease ([place])   . Diabetes mellitus   . Diverticulitis 12/08  . GERD (gastroesophageal reflux disease)    occ  . Hyperlipidemia   . Hypertension   . ICD (implantable cardiac defibrillator) in place     12-08-- [employer]   . 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 Stroke [facility])     Family History  Problem Relation Age of Onset  . Breast cancer Mother 52  . Tuberculosis Father   . Colon cancer Neg Hx    Past Surgical History:  Procedure Laterality Date  . AMPUTATION Left 06/08/2012   Procedure: AMPUTATION RAY;  Surgeon: Newt Minion, MD;  Location: [facility];  Service: Orthopedics;  Laterality: Left;  Left Foot 1st Ray Amputation  . ARTERIAL BYPASS SURGRY     08/08/2000  LIMA to LAD, SVG to intermediate, SVG to OM2, SVG to distal RCA  . CARDIAC DEFIBRILLATOR PLACEMENT     [employer]   Social History   Occupational History  . n/a    Social History Main Topics  . Smoking status: Never Smoker  . Smokeless tobacco: Never Used     Comment: never used tobacco  .  Alcohol use No  . Drug use: No  . Sexual activity: No

## 2015-12-31 ENCOUNTER — Ambulatory Visit: Payer: Medicare Other | Admitting: Internal Medicine

## 2016-01-04 ENCOUNTER — Ambulatory Visit: Payer: Medicare Other | Admitting: Cardiovascular Disease

## 2016-01-12 ENCOUNTER — Ambulatory Visit (INDEPENDENT_AMBULATORY_CARE_PROVIDER_SITE_OTHER): Payer: Medicare Other | Admitting: Orthopedic Surgery

## 2016-01-12 DIAGNOSIS — L97421 Non-pressure chronic ulcer of left heel and midfoot limited to breakdown of skin: Secondary | ICD-10-CM | POA: Diagnosis not present

## 2016-01-12 DIAGNOSIS — Z79899 Other long term (current) drug therapy: Secondary | ICD-10-CM | POA: Diagnosis not present

## 2016-01-12 DIAGNOSIS — I87332 Chronic venous hypertension (idiopathic) with ulcer and inflammation of left lower extremity: Secondary | ICD-10-CM | POA: Diagnosis not present

## 2016-01-12 DIAGNOSIS — L97929 Non-pressure chronic ulcer of unspecified part of left lower leg with unspecified severity: Secondary | ICD-10-CM

## 2016-01-12 LAB — CBC WITH DIFFERENTIAL/PLATELET
Basophils Absolute: 0 cells/uL (ref 0–200)
Basophils Relative: 0 %
Eosinophils Absolute: 212 cells/uL (ref 15–500)
Eosinophils Relative: 2 %
HCT: 25 % — ABNORMAL LOW (ref 35.0–45.0)
Hemoglobin: 7.9 g/dL — ABNORMAL LOW (ref 11.7–15.5)
Lymphocytes Relative: 24 %
Lymphs Abs: 2544 cells/uL (ref 850–3900)
MCH: 24.7 pg — ABNORMAL LOW (ref 27.0–33.0)
MCHC: 31.6 g/dL — ABNORMAL LOW (ref 32.0–36.0)
MCV: 78.1 fL — ABNORMAL LOW (ref 80.0–100.0)
MPV: 8.8 fL (ref 7.5–12.5)
Monocytes Absolute: 954 cells/uL — ABNORMAL HIGH (ref 200–950)
Monocytes Relative: 9 %
Neutro Abs: 6890 cells/uL (ref 1500–7800)
Neutrophils Relative %: 65 %
Platelets: 237 10*3/uL (ref 140–400)
RBC: 3.2 MIL/uL — ABNORMAL LOW (ref 3.80–5.10)
RDW: 15.4 % — ABNORMAL HIGH (ref 11.0–15.0)
WBC: 10.6 10*3/uL (ref 3.8–10.8)

## 2016-01-12 LAB — BASIC METABOLIC PANEL
BUN: 43 mg/dL — AB (ref 7–25)
CALCIUM: 8.6 mg/dL (ref 8.6–10.4)
CO2: 23 mmol/L (ref 20–31)
CREATININE: 1.98 mg/dL — AB (ref 0.60–0.88)
Chloride: 95 mmol/L — ABNORMAL LOW (ref 98–110)
GLUCOSE: 110 mg/dL — AB (ref 65–99)
Potassium: 5.3 mmol/L (ref 3.5–5.3)
SODIUM: 128 mmol/L — AB (ref 135–146)

## 2016-01-12 NOTE — Progress Notes (Signed)
Office Visit Note   Patient: [name] [name]           Date of Birth: 02-Dec-1932           MRN: [id number] Visit Date: 01/12/2016              Requested by: Colon Branch, MD [address] [place], [place] [address] PCP: Kathlene November, MD   Assessment & Plan: Visit Diagnoses:  1. Midfoot ulcer, left, limited to breakdown of skin ([place])   2. Idiopathic chronic venous hypertension of left lower extremity with ulcer and inflammation (HCC)   3. High risk medication use     Plan: Continue wound care left foot continue elevation. Recommend compression. Blood work was drawn to the patient's multiple medications and high risk use of medications a CBC and a basic metabolic profile was obtained. Plan to have patient follow-up with her primary care physician with results from the blood work.  Follow-Up Instructions: Return in about 3 weeks (around 02/02/2016).   Orders:  Orders Placed This Encounter  Procedures  . CBC with Differential/Platelet  . Basic Metabolic Panel (BMET)   No orders of the defined types were placed in this encounter.     Procedures: No procedures performed   Clinical Data: No additional findings.   Subjective: No chief complaint on file.   Left mid-foot ulcer. Wearing ACE bandage over ulcer.     Review of Systems   Objective: Vital Signs: There were no vitals taken for this visit.  Physical Exam examination patient has venous and lymphedema swelling the left lower extremity there is no cellulitis no drainage no signs of infection. The ulcer over the dorsum the ankle has healed. She still has persistent ulcer over the medial column. No cellulitis no drainage no odor no signs of infection.  Ortho Exam  Specialty Comments:  No specialty comments available.  Imaging: No results found.   PMFS History: Patient Active Problem List   Diagnosis Date Noted  . Midfoot ulcer, left, limited to breakdown of skin ([place]) 01/13/2016  . Idiopathic  chronic venous hypertension of left lower extremity with ulcer and inflammation ([place]) 01/13/2016  . PCP NOTES >>>>>>>>>>>>>>>>>>>>> 09/30/2015  . Annual physical exam 08/28/2013  . Elevated alkaline phosphatase level 12/19/2012  . UTI (urinary tract infection) 11/20/2012  . Unspecified constipation 07/11/2012  . Diabetic foot ulcer- LEFT-s/p amputation first Ray 2014 Dr. Sharol Given 04/11/2012  . Coronary artery disease 09/05/2010  . GERD (gastroesophageal reflux disease) 07/01/2010  . Anemia, iron deficiency 11/06/2009  . CT, CHEST, ABNORMAL 06/10/2009  . CARDIOMYOPATHY, ISCHEMIC 04/14/2009  . Chronic systolic heart failure ([place]) 04/14/2009  . AUTOMATIC IMPLANTABLE CARDIAC DEFIBRILLATOR SITU 04/14/2009  . DIVERTICULOSIS OF COLON 07/18/2008  . LIVER FUNCTION TESTS, ABNORMAL, HX OF 07/18/2008  . INSOMNIA-SLEEP DISORDER-UNSPEC 11/05/2007  . GAIT DISTURBANCE 06/07/2007  . CHOLELITHIASIS 03/09/2007  . Carotid artery disease ([place]) 02/12/2007  . DM II (diabetes mellitus, type II), controlled ([place]) 10/27/2006  . Dyslipidemia 10/27/2006  . HTN (hypertension) 10/27/2006   Past Medical History:  Diagnosis Date  . Abnormal CT scan, chest    last 06-08-2009, see report   . Anemia    iron def   . Barrett esophagus    EGD 08/2009, next 2013  . CAD (coronary artery disease)    s/p stent R side 9/08, re stenosis 1/09, s/p angioplasty 1/09 and a stent on 12/09; re-angioplasty 5/11  . Carotid arterial disease (HCC)    s/p stent  R side 9-08, re stenosis 1-09, s/p angioplasty 1-09 and a stent on 12-09, last  angiogram 06-24-2009  . CHF (congestive heart failure) ([place])   . Collagen vascular disease ([place])   . Diabetes mellitus   . Diverticulitis 12/08  . GERD (gastroesophageal reflux disease)    occ  . Hyperlipidemia   . Hypertension   . ICD (implantable cardiac defibrillator) in place     12-08-- [employer]   . 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 Stroke [facility])     Family History  Problem  Relation Age of Onset  . Breast cancer Mother 93  . Tuberculosis Father   . Colon cancer Neg Hx     Past Surgical History:  Procedure Laterality Date  . AMPUTATION Left 06/08/2012   Procedure: AMPUTATION RAY;  Surgeon: Newt Minion, MD;  Location: [facility];  Service: Orthopedics;  Laterality: Left;  Left Foot 1st Ray Amputation  . ARTERIAL BYPASS SURGRY     08/08/2000  LIMA to LAD, SVG to intermediate, SVG to OM2, SVG to distal RCA  . CARDIAC DEFIBRILLATOR PLACEMENT     [employer]   Social History   Occupational History  . n/a    Social History Main Topics  . Smoking status: Never Smoker  . Smokeless tobacco: Never Used     Comment: never used tobacco  . Alcohol use No  . Drug use: No  . Sexual activity: No

## 2016-01-13 ENCOUNTER — Telehealth: Payer: Self-pay | Admitting: Internal Medicine

## 2016-01-13 DIAGNOSIS — L97421 Non-pressure chronic ulcer of left heel and midfoot limited to breakdown of skin: Secondary | ICD-10-CM | POA: Insufficient documentation

## 2016-01-13 DIAGNOSIS — L97929 Non-pressure chronic ulcer of unspecified part of left lower leg with unspecified severity: Secondary | ICD-10-CM

## 2016-01-13 DIAGNOSIS — I87332 Chronic venous hypertension (idiopathic) with ulcer and inflammation of left lower extremity: Secondary | ICD-10-CM | POA: Insufficient documentation

## 2016-01-13 NOTE — Telephone Encounter (Signed)
Labs done at orthopedic surgery show elevated creatinine, worsening anemia, slightly high potassium. Spoke with the patient and her daughter, the plan is: Stop ramipril and  metformin Check BPs, call if  >  150 Keep appointment with Dr. Johnsie Cancel in about a week and with me in about 2 weeks. I will ask Dr. Johnsie Cancel to check a BMP-CBC. I asked them to reschedule an appointment with hematology regards anemia.

## 2016-01-17 NOTE — Progress Notes (Signed)
Cardiology Office Note   Date:  01/20/2016   ID:  [name] [name], DOB 06-27-1932, MRN [id number]  PCP:  Kathlene November, MD  Cardiologist:   Jenkins Rouge, MD   Chief Complaint  Patient presents with  . Establish Care    CHF      History of Present Illness: [name] [name] is a 82 y.o. female who presents for evaluation of CHF She has previously been seen by Dr Lia Foyer last in 2014.  She has distant history of CAD with CABG and single chamber ICD latter done in 2008  CABG with LIMA to LAD SVG to IM SVG to OM and SVG to distal RCA. Also history of CVA with RICA stent done by IR 06/25/09 restenosis with balloon angioplasty done   Does not appear to have had regular f/u for ICD  Last echo 06/11/12 RG 60-65% ,ild MR mod TR PA 40 mmHg  Sees [facility] for chronic venous hypertension LE ulcer left foot Poorly controlled diabetic   She is here with daughter. She lives independently and ambulates sparingly with walker No chest pain Some exertional dyspnea. Compliant with meds   Past Medical History:  Diagnosis Date  . Abnormal CT scan, chest    last 06-08-2009, see report   . Anemia    iron def   . Barrett esophagus    EGD 08/2009, next 2013  . CAD (coronary artery disease)    s/p stent R side 9/08, re stenosis 1/09, s/p angioplasty 1/09 and a stent on 12/09; re-angioplasty 5/11  . Carotid arterial disease (HCC)    s/p stent R side 9-08, re stenosis 1-09, s/p angioplasty 1-09 and a stent on 12-09, last  angiogram 06-24-2009  . CHF (congestive heart failure) ([place])   . Collagen vascular disease ([place])   . Diabetes mellitus   . Diverticulitis 12/08  . GERD (gastroesophageal reflux disease)    occ  . Hyperlipidemia   . Hypertension   . ICD (implantable cardiac defibrillator) in place     12-08-- [employer]   . 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 Stroke [facility])     Past Surgical History:  Procedure Laterality Date  . AMPUTATION Left 06/08/2012   Procedure: AMPUTATION RAY;  Surgeon:  Newt Minion, MD;  Location: [place];  Service: Orthopedics;  Laterality: Left;  Left Foot 1st Ray Amputation  . ARTERIAL BYPASS SURGRY     08/08/2000  LIMA to LAD, SVG to intermediate, SVG to OM2, SVG to distal RCA  . CARDIAC DEFIBRILLATOR PLACEMENT     [employer]     Current Outpatient Prescriptions  Medication Sig Dispense Refill  . acetaminophen (TYLENOL) 500 MG tablet Take 1,000 mg by mouth every 8 (eight) hours as needed.    Marland Kitchen aspirin 325 MG tablet Take 325 mg by mouth daily.     Marland Kitchen atorvastatin (LIPITOR) 40 MG tablet Take 1 tablet (40 mg total) by mouth daily. 30 tablet 6  . clopidogrel (PLAVIX) 75 MG tablet Take 1 tablet (75 mg total) by mouth daily. 30 tablet 6  . docusate sodium (COLACE) 100 MG capsule Take 100 mg by mouth daily as needed for mild constipation.    . furosemide (LASIX) 40 MG tablet Take 1 tablet (40 mg total) by mouth daily. 30 tablet 6  . glimepiride (AMARYL) 1 MG tablet Take 1 tablet (1 mg total) by mouth daily with breakfast. 30 tablet 6  . glucose blood test strip Check blood sugars twice daily.  100 each 3  . metoprolol (LOPRESSOR) 50 MG tablet Take 1 tablet (50 mg total) by mouth 2 (two) times daily. 60 tablet 6  . mupirocin ointment (BACTROBAN) 2 % Apply 1 application topically 2 (two) times daily. 22 g 6  . nitroGLYCERIN (NITROSTAT) 0.4 MG SL tablet Place 1 tablet (0.4 mg total) under the tongue every 5 (five) minutes as needed for chest pain. Contact your doctor and/or seek medial attention right away if chest pain 40 tablet 0  . potassium chloride (K-DUR,KLOR-CON) 10 MEQ tablet Take 1 tablet (10 mEq total) by mouth daily. 30 tablet 6  . sulfamethoxazole-trimethoprim (BACTRIM DS,SEPTRA DS) 800-160 MG tablet Take 1 tablet by mouth 2 (two) times daily. 60 tablet 0  . metFORMIN (GLUCOPHAGE) 1000 MG tablet Take 1,000 mg by mouth 2 (two) times daily.    . ramipril (ALTACE) 5 MG capsule Take 5 mg by mouth daily.     No current facility-administered medications for  this visit.     Allergies:   Patient has no known allergies.    Social History:  The patient  reports that she has never smoked. She has never used smokeless tobacco. She reports that she does not drink alcohol or use drugs.   Family History:  The patient's family history includes Breast cancer (age of onset: 19) in her mother; Tuberculosis in her father.    ROS:  Please see the history of present illness.   Otherwise, review of systems are positive for none.   All other systems are reviewed and negative.    PHYSICAL EXAM: VS:  BP (!) 170/60   Pulse 72   Ht 5\' 8"  (1.727 m)   Wt 84.6 kg (186 lb 6.4 oz)   SpO2 98%   BMI 28.34 kg/m  , BMI Body mass index is 28.34 kg/m. Affect appropriate Pale chronically ill female  HEENT: normal Neck supple with no adenopathy JVP normal no bruits no thyromegaly Lungs clear with no wheezing and good diaphragmatic motion Heart:  S1/S2 no murmur, no rub, gallop or click AICD under left clavicle  PMI normal Abdomen: benighn, BS positve, no tenderness, no AAA no bruit.  No HSM or HJR Left forefoot amputation  No edema Neuro non-focal Skin warm and dry No muscular weakness    EKG:  07/18/15  SR low voltage no acute changes    Recent Labs: 02/19/2015: ALT 12 01/12/2016: BUN 43; Creat 1.98; Hemoglobin 7.9; Platelets 237; Potassium 5.3; Sodium 128    Lipid Panel    Component Value Date/Time   CHOL 122 09/30/2015 1626   TRIG 186.0 (H) 09/30/2015 1626   HDL 30.90 (L) 09/30/2015 1626   CHOLHDL 4 09/30/2015 1626   VLDL 37.2 09/30/2015 1626   LDLCALC 54 09/30/2015 1626   LDLDIRECT 82.2 07/10/2007 1039      Wt Readings from Last 3 Encounters:  01/20/16 84.6 kg (186 lb 6.4 oz)  12/30/15 85.3 kg (188 lb)  09/30/15 85.3 kg (188 lb)      Other studies Reviewed: Additional studies/ records that were reviewed today include: Dr Lia Foyer notes cath CABG notes Dr Lovena Le AICD labs and Dr Larose Kells primary care notes .    ASSESSMENT AND  PLAN:  1.  CAD: distant CABG stable no angina continue current meds 2. AICD: have arranged for Dr Curt Bears to see Monday to interrogate device It's a St Jude No d/c's 3. Ischemic DCM:  No signs of CHF /fu echo to assess RV/LV function 4. Primary:  Dr Larose Kells  requested f/u CBC and BMET ordered today  5. HTN:  Well controlled.  Continue current medications and low sodium Dash type diet.   6. DM:  Discussed low carb diet.  Target hemoglobin A1c is 6.5 or less.  Continue current medications. 7. Chol:   Cholesterol is at goal.  Continue current dose of statin and diet Rx.  No myalgias or side effects.  F/U  LFT's in 6 months. Lab Results  Component Value Date   LDLCALC 54 09/30/2015                Current medicines are reviewed at length with the patient today.  The patient does not have concerns regarding medicines.  The following changes have been made:  no change  Labs/ tests ordered today include: Echo   Orders Placed This Encounter  Procedures  . Basic metabolic panel  . CBC w/Diff  . ECHOCARDIOGRAM COMPLETE     Disposition:   FU with me in a year See EP [facility] Monday      Signed, Jenkins Rouge, MD  01/20/2016 4:44 PM    [facility] [address], [place], [place]  [address] Phone: [phone]; Fax: [phone]

## 2016-01-20 ENCOUNTER — Ambulatory Visit (INDEPENDENT_AMBULATORY_CARE_PROVIDER_SITE_OTHER): Payer: Medicare Other | Admitting: Cardiovascular Disease

## 2016-01-20 ENCOUNTER — Encounter: Payer: Self-pay | Admitting: Cardiovascular Disease

## 2016-01-20 VITALS — BP 170/60 | HR 72 | Ht 68.0 in | Wt 186.4 lb

## 2016-01-20 DIAGNOSIS — I5022 Chronic systolic (congestive) heart failure: Secondary | ICD-10-CM | POA: Diagnosis not present

## 2016-01-20 DIAGNOSIS — Z7689 Persons encountering health services in other specified circumstances: Secondary | ICD-10-CM

## 2016-01-20 DIAGNOSIS — Z79899 Other long term (current) drug therapy: Secondary | ICD-10-CM | POA: Diagnosis not present

## 2016-01-20 LAB — BASIC METABOLIC PANEL
BUN: 37 mg/dL — ABNORMAL HIGH (ref 7–25)
CALCIUM: 9 mg/dL (ref 8.6–10.4)
CO2: 22 mmol/L (ref 20–31)
CREATININE: 1.56 mg/dL — AB (ref 0.60–0.88)
Chloride: 96 mmol/L — ABNORMAL LOW (ref 98–110)
Glucose, Bld: 238 mg/dL — ABNORMAL HIGH (ref 65–99)
Potassium: 4.6 mmol/L (ref 3.5–5.3)
SODIUM: 128 mmol/L — AB (ref 135–146)

## 2016-01-20 LAB — CBC WITH DIFFERENTIAL/PLATELET
BASOS PCT: 0 %
Basophils Absolute: 0 cells/uL (ref 0–200)
EOS PCT: 2 %
Eosinophils Absolute: 210 cells/uL (ref 15–500)
HCT: 24.4 % — ABNORMAL LOW (ref 35.0–45.0)
Hemoglobin: 7.7 g/dL — ABNORMAL LOW (ref 11.7–15.5)
Lymphocytes Relative: 23 %
Lymphs Abs: 2415 cells/uL (ref 850–3900)
MCH: 24.8 pg — AB (ref 27.0–33.0)
MCHC: 31.6 g/dL — ABNORMAL LOW (ref 32.0–36.0)
MCV: 78.5 fL — ABNORMAL LOW (ref 80.0–100.0)
MONOS PCT: 7 %
MPV: 8.5 fL (ref 7.5–12.5)
Monocytes Absolute: 735 cells/uL (ref 200–950)
NEUTROS ABS: 7140 {cells}/uL (ref 1500–7800)
Neutrophils Relative %: 68 %
PLATELETS: 246 10*3/uL (ref 140–400)
RBC: 3.11 MIL/uL — AB (ref 3.80–5.10)
RDW: 15.9 % — ABNORMAL HIGH (ref 11.0–15.0)
WBC: 10.5 10*3/uL (ref 3.8–10.8)

## 2016-01-20 NOTE — Patient Instructions (Addendum)
Medication Instructions:  Your physician recommends that you continue on your current medications as directed. Please refer to the Current Medication list given to you today.  Labwork: Your physician recommends that you have lab work today- BMET and CBC  Testing/Procedures: Your physician has requested that you have an echocardiogram. Echocardiography is a painless test that uses sound waves to create images of your heart. It provides your doctor with information about the size and shape of your heart and how well your heart's chambers and valves are working. This procedure takes approximately one hour. There are no restrictions for this procedure.  Follow-Up: Your physician wants you to follow-up in: 6 months with Dr. Johnsie Cancel. You will receive a reminder letter in the mail two months in advance. If you don't receive a letter, please call our office to schedule the follow-up appointment.  Your physician recommends that you follow-up on Monday at 3:00 pm with Dr. Curt Bears.   If you need a refill on your cardiac medications before your next appointment, please call your pharmacy.

## 2016-01-21 ENCOUNTER — Telehealth: Payer: Self-pay | Admitting: Internal Medicine

## 2016-01-21 ENCOUNTER — Telehealth: Payer: Self-pay

## 2016-01-21 DIAGNOSIS — D509 Iron deficiency anemia, unspecified: Secondary | ICD-10-CM

## 2016-01-21 MED ORDER — FUROSEMIDE 20 MG PO TABS: 20.0000 mg | ORAL_TABLET | Freq: Every day | ORAL | 6 refills | Status: DC

## 2016-01-21 NOTE — Telephone Encounter (Signed)
LMOM informing Pt to return call.  

## 2016-01-21 NOTE — Telephone Encounter (Signed)
Creatinine increased, hemoglobin decreased. Cardiology already decreased Lasix dose and stop potassium. Advise patient: Because her kidney function is decreased we need to drop metformin dose to 1 tablet a day only. Arrange a hematology referral due to worsening anemia Schedule office visit here in one month, we need to recheck her kidney function.

## 2016-01-21 NOTE — Telephone Encounter (Signed)
-----   Message from Josue Hector, MD sent at 01/21/2016  7:55 AM EST ----- Dr Larose Kells to f/u would decrease lasix to 20 mg and stop K

## 2016-01-21 NOTE — Telephone Encounter (Signed)
Notes Recorded by Michaelyn Barter, RN on 01/21/2016 at 9:13 AM EST Called patient about her lab results. Per Dr. Johnsie Cancel, would decrease lasix to 20 mg and stop K. Informed patient about this changes, and that Dr. Larose Kells might have more instructions for her. Patient stated she already took her medications today, so she will start taking lower dose of Lasix tomorrow.

## 2016-01-25 ENCOUNTER — Encounter: Payer: Self-pay | Admitting: Cardiology

## 2016-01-25 ENCOUNTER — Ambulatory Visit (INDEPENDENT_AMBULATORY_CARE_PROVIDER_SITE_OTHER): Payer: Medicare Other | Admitting: Cardiology

## 2016-01-25 VITALS — BP 130/62 | HR 58 | Ht 68.0 in | Wt 180.0 lb

## 2016-01-25 DIAGNOSIS — Z95810 Presence of automatic (implantable) cardiac defibrillator: Secondary | ICD-10-CM

## 2016-01-25 DIAGNOSIS — I255 Ischemic cardiomyopathy: Secondary | ICD-10-CM

## 2016-01-25 LAB — CUP PACEART INCLINIC DEVICE CHECK
HIGH POWER IMPEDANCE MEASURED VALUE: 41.9063
Implantable Lead Implant Date: 20081211
Implantable Pulse Generator Implant Date: 20081211
Lead Channel Impedance Value: 412.5 Ohm
Lead Channel Pacing Threshold Amplitude: 0.5 V
Lead Channel Pacing Threshold Pulse Width: 0.5 ms
Lead Channel Setting Pacing Amplitude: 2.5 V
MDC IDC LEAD LOCATION: 753860
MDC IDC LEAD MODEL: 7121
MDC IDC MSMT BATTERY VOLTAGE: 2.5 V
MDC IDC MSMT LEADCHNL RV SENSING INTR AMPL: 8.5 mV
MDC IDC SESS DTM: 20171204182931
MDC IDC SET LEADCHNL RV PACING PULSEWIDTH: 0.5 ms
MDC IDC SET LEADCHNL RV SENSING SENSITIVITY: 0.3 mV
MDC IDC STAT BRADY RV PERCENT PACED: 0.17 %
Pulse Gen Serial Number: 511432

## 2016-01-25 NOTE — Progress Notes (Signed)
Electrophysiology Office Note   Date:  01/25/2016   ID:  [name] [name], DOB Dec 17, 1932, MRN [id number]  PCP:  Kathlene November, MD  Cardiologist:  Johnsie Cancel Primary Electrophysiologist:  Darrin Apodaca Meredith Leeds, MD    Chief Complaint  Patient presents with  . DFIB CHECK    Chronic systolic heart failure      History of Present Illness: [name] [name] is a 82 y.o. female who presents today for electrophysiology evaluation.   She has distant history of CAD with CABG and single chamber ICD latter done in 2008  CABG with LIMA to LAD SVG to IM SVG to OM and SVG to distal RCA. Also history of CVA with RICA stent done by IR 06/25/09 restenosis with balloon angioplasty done.   Today, she denies symptoms of palpitations, chest pain, shortness of breath, orthopnea, PND, lower extremity edema, claudication, dizziness, presyncope, syncope, bleeding, or neurologic sequela. The patient is tolerating medications without difficulties and is otherwise without complaint today.    Past Medical History:  Diagnosis Date  . Abnormal CT scan, chest    last 06-08-2009, see report   . Anemia    iron def   . Barrett esophagus    EGD 08/2009, next 2013  . CAD (coronary artery disease)    s/p stent R side 9/08, re stenosis 1/09, s/p angioplasty 1/09 and a stent on 12/09; re-angioplasty 5/11  . Carotid arterial disease (HCC)    s/p stent R side 9-08, re stenosis 1-09, s/p angioplasty 1-09 and a stent on 12-09, last  angiogram 06-24-2009  . CHF (congestive heart failure) ([place])   . Collagen vascular disease ([place])   . Diabetes mellitus   . Diverticulitis 12/08  . GERD (gastroesophageal reflux disease)    occ  . Hyperlipidemia   . Hypertension   . ICD (implantable cardiac defibrillator) in place     12-08-- [employer]   . 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 Stroke [facility])    Past Surgical History:  Procedure Laterality Date  . AMPUTATION Left 06/08/2012   Procedure: AMPUTATION RAY;  Surgeon: Newt Minion,  MD;  Location: [place];  Service: Orthopedics;  Laterality: Left;  Left Foot 1st Ray Amputation  . ARTERIAL BYPASS SURGRY     08/08/2000  LIMA to LAD, SVG to intermediate, SVG to OM2, SVG to distal RCA  . CARDIAC DEFIBRILLATOR PLACEMENT     [employer]     Current Outpatient Prescriptions  Medication Sig Dispense Refill  . acetaminophen (TYLENOL) 500 MG tablet Take 1,000 mg by mouth every 8 (eight) hours as needed.    Marland Kitchen aspirin 325 MG tablet Take 325 mg by mouth daily.     Marland Kitchen atorvastatin (LIPITOR) 40 MG tablet Take 1 tablet (40 mg total) by mouth daily. 30 tablet 6  . clopidogrel (PLAVIX) 75 MG tablet Take 1 tablet (75 mg total) by mouth daily. 30 tablet 6  . docusate sodium (COLACE) 100 MG capsule Take 100 mg by mouth daily as needed for mild constipation.    . furosemide (LASIX) 20 MG tablet Take 1 tablet (20 mg total) by mouth daily. 30 tablet 6  . glimepiride (AMARYL) 1 MG tablet Take 1 tablet (1 mg total) by mouth daily with breakfast. 30 tablet 6  . glucose blood test strip Check blood sugars twice daily. 100 each 3  . metoprolol (LOPRESSOR) 50 MG tablet Take 1 tablet (50 mg total) by mouth 2 (two) times daily. 60 tablet 6  .  mupirocin ointment (BACTROBAN) 2 % Apply 1 application topically 2 (two) times daily. 22 g 6  . nitroGLYCERIN (NITROSTAT) 0.4 MG SL tablet Place 1 tablet (0.4 mg total) under the tongue every 5 (five) minutes as needed for chest pain. Contact your doctor and/or seek medial attention right away if chest pain 40 tablet 0  . sulfamethoxazole-trimethoprim (BACTRIM DS,SEPTRA DS) 800-160 MG tablet Take 1 tablet by mouth 2 (two) times daily. 60 tablet 0   No current facility-administered medications for this visit.     Allergies:   Patient has no known allergies.   Social History:  The patient  reports that she has never smoked. She has never used smokeless tobacco. She reports that she does not drink alcohol or use drugs.   Family History:  The patient's family history  includes Breast cancer (age of onset: 22) in her mother; Tuberculosis in her father.    ROS:  Please see the history of present illness.   Otherwise, review of systems is positive for none.   All other systems are reviewed and negative.    PHYSICAL EXAM: VS:  BP 130/62   Pulse (!) 58   Ht 5\' 8"  (1.727 m)   Wt 180 lb (81.6 kg)   BMI 27.37 kg/m  , BMI Body mass index is 27.37 kg/m. GEN: Well nourished, well developed, in no acute distress  HEENT: normal  Neck: no JVD, carotid bruits, or masses Cardiac: RRR; no murmurs, rubs, or gallops,no edema  Respiratory:  clear to auscultation bilaterally, normal work of breathing GI: soft, nontender, nondistended, + BS MS: no deformity or atrophy  Skin: warm and dry,  device pocket is well healed Neuro:  Strength and sensation are intact Psych: euthymic mood, full affect  EKG:  EKG is ordered today. Personal review of the ekg ordered shows sinus rhythm, rate 58, 1 degree AV block, lateral T wave abnormality    Device interrogation is reviewed today in detail.  See PaceArt for details.   Recent Labs: 02/19/2015: ALT 12 01/20/2016: BUN 37; Creat 1.56; Hemoglobin 7.7; Platelets 246; Potassium 4.6; Sodium 128    Lipid Panel     Component Value Date/Time   CHOL 122 09/30/2015 1626   TRIG 186.0 (H) 09/30/2015 1626   HDL 30.90 (L) 09/30/2015 1626   CHOLHDL 4 09/30/2015 1626   VLDL 37.2 09/30/2015 1626   LDLCALC 54 09/30/2015 1626   LDLDIRECT 82.2 07/10/2007 1039     Wt Readings from Last 3 Encounters:  01/25/16 180 lb (81.6 kg)  01/20/16 186 lb 6.4 oz (84.6 kg)  12/30/15 188 lb (85.3 kg)      Other studies Reviewed: Additional studies/ records that were reviewed today include: 2014 TTE  Review of the above records today demonstrates:  - Left ventricle: The cavity size was severely dilated. Wall thickness was normal. Systolic function was normal. The estimated ejection fraction was in the range of 60% to 65%. Doppler  parameters are consistent with abnormal left ventricular relaxation (grade 1 diastolic dysfunction). - Mitral valve: Mild regurgitation. - Left atrium: The atrium was moderately dilated. - Tricuspid valve: Moderate regurgitation. - Pulmonary arteries: PA peak pressure: 52mm Hg (S).   ASSESSMENT AND PLAN:  1.  CAD: distant CABG stable no angina continue current meds 2. AICD: Device is functioning appropriately without any major issues. She does have 5 months left on her battery. We Blair Lundeen bring her back to device clinic in 3 months for interrogation. She was educated about vibratory alerts. 3.  Ischemic DCM:   no current symptoms. Continue current medications.  4. HTN:  Well controlled.  Continue current medications and low sodium Dash type diet.   5. Chol:  Cholesterol is at goal.   continue statin  Current medicines are reviewed at length with the patient today.   The patient does not have concerns regarding her medicines.  The following changes were made today:  none  Labs/ tests ordered today include:  Orders Placed This Encounter  Procedures  . EKG 12-Lead     Disposition:   FU with Charina Fons 1 year  Signed, Teven Mittman Meredith Leeds, MD  01/25/2016 3:27 PM     [place] [address] [place] [address] [phone] (office) [phone] (fax)

## 2016-01-25 NOTE — Telephone Encounter (Signed)
Hematology referral placed. Letter printed and mailed to Pt regarding below.

## 2016-01-25 NOTE — Telephone Encounter (Signed)
LMOM requesting call back

## 2016-01-25 NOTE — Patient Instructions (Addendum)
Medication Instructions:    Your physician recommends that you continue on your current medications as directed. Please refer to the Current Medication list given to you today.  --- If you need a refill on your cardiac medications before your next appointment, please call your pharmacy. ---  Labwork:  None ordered  Testing/Procedures:  None ordered  Follow-Up:  Your physician recommends that you schedule a follow-up appointment in: 2 months with device clinic   Your physician wants you to follow-up in: 1 year with Dr. Curt Bears.  You will receive a reminder letter in the mail two months in advance. If you don't receive a letter, please call our office to schedule the follow-up appointment.   Thank you for choosing [facility]!!   Trinidad Curet, RN [phone]

## 2016-01-25 NOTE — Telephone Encounter (Signed)
If you can't  contact her today, please send a letter with instructions

## 2016-01-25 NOTE — Telephone Encounter (Signed)
Have been unable to contact Pt via telephone. Will try again, however Pt does have f/u scheduled 01/27/2016.

## 2016-01-27 ENCOUNTER — Encounter: Payer: Self-pay | Admitting: Internal Medicine

## 2016-01-27 ENCOUNTER — Ambulatory Visit (INDEPENDENT_AMBULATORY_CARE_PROVIDER_SITE_OTHER): Payer: Medicare Other | Admitting: Internal Medicine

## 2016-01-27 VITALS — BP 132/39 | HR 64 | Temp 97.5°F | Ht 68.0 in | Wt 181.0 lb

## 2016-01-27 DIAGNOSIS — G4700 Insomnia, unspecified: Secondary | ICD-10-CM

## 2016-01-27 DIAGNOSIS — E118 Type 2 diabetes mellitus with unspecified complications: Secondary | ICD-10-CM

## 2016-01-27 DIAGNOSIS — Z23 Encounter for immunization: Secondary | ICD-10-CM | POA: Diagnosis not present

## 2016-01-27 DIAGNOSIS — I10 Essential (primary) hypertension: Secondary | ICD-10-CM | POA: Diagnosis not present

## 2016-01-27 DIAGNOSIS — Z7409 Other reduced mobility: Secondary | ICD-10-CM

## 2016-01-27 DIAGNOSIS — I255 Ischemic cardiomyopathy: Secondary | ICD-10-CM

## 2016-01-27 DIAGNOSIS — E1142 Type 2 diabetes mellitus with diabetic polyneuropathy: Secondary | ICD-10-CM | POA: Diagnosis not present

## 2016-01-27 MED ORDER — GABAPENTIN 100 MG PO CAPS: 300.0000 mg | ORAL_CAPSULE | Freq: Every evening | ORAL | 0 refills | Status: DC | PRN

## 2016-01-27 NOTE — Progress Notes (Signed)
Subjective:    Patient ID: [name] [name], female    DOB: January 31, 1933, 82 y.o.   MRN: [id number]  DOS:  01/27/2016 Type of visit - description :  Here with her daughter, several issues Interval history: Has problems sleeping from time to time, previously Xanax helped. Refill?. Has left foot pain, for several months, described as burning at the plantar area. No major problems with back pain, no numbness or pain in the rest of the lower extremity. CAD: Saw cardiology 2 days ago no reviewed, felt to be stable . Recently, kidney function and hemoglobin were decreased. We decrease dose of metformin and refer her to hematology.  Request a PT referral. Has difficulty transferring.  Review of Systems   Past Medical History:  Diagnosis Date  . Abnormal CT scan, chest    last 06-08-2009, see report   . Anemia    iron def   . Barrett esophagus    EGD 08/2009, next 2013  . CAD (coronary artery disease)    s/p stent R side 9/08, re stenosis 1/09, s/p angioplasty 1/09 and a stent on 12/09; re-angioplasty 5/11  . Carotid arterial disease (HCC)    s/p stent R side 9-08, re stenosis 1-09, s/p angioplasty 1-09 and a stent on 12-09, last  angiogram 06-24-2009  . CHF (congestive heart failure) ([place])   . Collagen vascular disease ([place])   . Diabetes mellitus   . Diverticulitis 12/08  . GERD (gastroesophageal reflux disease)    occ  . Hyperlipidemia   . Hypertension   . ICD (implantable cardiac defibrillator) in place     12-08-- [employer]   . 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 Stroke [facility])     Past Surgical History:  Procedure Laterality Date  . AMPUTATION Left 06/08/2012   Procedure: AMPUTATION RAY;  Surgeon: Newt Minion, MD;  Location: [place];  Service: Orthopedics;  Laterality: Left;  Left Foot 1st Ray Amputation  . ARTERIAL BYPASS SURGRY     08/08/2000  LIMA to LAD, SVG to intermediate, SVG to OM2, SVG to distal RCA  . CARDIAC DEFIBRILLATOR PLACEMENT     [employer]    Social  History   Social History  . Marital status: Widowed    Spouse name: N/A  . Number of children: 6  . Years of education: N/A   Occupational History  . n/a    Social History Main Topics  . Smoking status: Never Smoker  . Smokeless tobacco: Never Used     Comment: never used tobacco  . Alcohol use No  . Drug use: No  . Sexual activity: No   Other Topics Concern  . Not on file   Social History Narrative   Widow, a son lives w/ her , somebody stays with her every night   six children   [name], daughter, usually comes w/ her    Doesn't drive anymore, limited  ADLs, uses a walker             Medication List       Accurate as of 01/27/16 11:59 PM. Always use your most recent med list.          acetaminophen 500 MG tablet Commonly known as:  TYLENOL Take 1,000 mg by mouth every 8 (eight) hours as needed.   aspirin 325 MG tablet Take 325 mg by mouth daily.   atorvastatin 40 MG tablet Commonly known as:  LIPITOR Take 1 tablet (40 mg total) by mouth daily.  clopidogrel 75 MG tablet Commonly known as:  PLAVIX Take 1 tablet (75 mg total) by mouth daily.   docusate sodium 100 MG capsule Commonly known as:  COLACE Take 100 mg by mouth daily as needed for mild constipation.   furosemide 20 MG tablet Commonly known as:  LASIX Take 1 tablet (20 mg total) by mouth daily.   gabapentin 100 MG capsule Commonly known as:  NEURONTIN Take 3 capsules (300 mg total) by mouth at bedtime as needed.   glimepiride 1 MG tablet Commonly known as:  AMARYL Take 1 tablet (1 mg total) by mouth daily with breakfast.   glucose blood test strip Check blood sugars twice daily.   metoprolol 50 MG tablet Commonly known as:  LOPRESSOR Take 1 tablet (50 mg total) by mouth 2 (two) times daily.   mupirocin ointment 2 % Commonly known as:  BACTROBAN Apply 1 application topically 2 (two) times daily.   nitroGLYCERIN 0.4 MG SL tablet Commonly known as:  NITROSTAT Place 1 tablet (0.4  mg total) under the tongue every 5 (five) minutes as needed for chest pain. Contact your doctor and/or seek medial attention right away if chest pain   sulfamethoxazole-trimethoprim 800-160 MG tablet Commonly known as:  BACTRIM DS,SEPTRA DS Take 1 tablet by mouth 2 (two) times daily.          Objective:   Physical Exam BP (!) 132/39 (BP Location: Left Arm, Patient Position: Sitting, Cuff Size: Large)   Pulse 64   Temp 97.5 F (36.4 C) (Oral)   Ht 5\' 8"  (1.727 m)   Wt 181 lb (82.1 kg)   SpO2 100% Comment: RA  BMI 27.52 kg/m  General:   Well developed, elderly lady sitting in a wheelchair, nondistressed  HEENT:  Normocephalic . Face symmetric, atraumatic Lungs:  CTA B Normal respiratory effort, no intercostal retractions, no accessory muscle use. Heart: RRR,  no murmur.  No pretibial edema bilaterally  Skin: Not pale. Not jaundice Neurologic:  alert & oriented X3.   Psych--  Cognition and judgment appear intact.  Cooperative with normal attention span and concentration.  Behavior appropriate. No anxious or depressed appearing.      Assessment & Plan:   Assessment DM, + h/o amputations HTN High cholesterol LE edema since CABG CV: ---CAD, CABG 2002 ---CHF cardiomyopathy ---Carotid disease Anemia, sees hem-onc, last ov 11-2014, multifactorial  Increase LFTs  PLAN: DM: Recent creatinine increased, I recommended to decrease metformin dose but she stopped, has some reservations about metformin. Plan: Continue glimepiride, check A1c in 2 or 3 months when she comes back. D/t age we won't aim for strict control. Neuropathy: As described above. Recommend a trial with gabapentin: Start with 100 mg then go to 200 mg or 300 mg as needed. See instructions. Insomnia: Previously Xanax help however will start Neurontin and I hope that is going to help her sleep as well. HTN: Recent creatinine increased, cardiology decreased Lasix to 20 mg and stopped potassium. So far she is  doing well with no worse edema and BP is okay. Check a BMP  Poor mobility: Refer to physical therapy  Anemia: Worsening, has an appointment to see hematology Flu shot today RTC 2 months  Today, I spent more than 28  min with the patient: More than 50% of the time counseling about treatment of neuropathy, coordinating her care, explaining benefits of gabapentin and the need to avoid Xanax if needed.

## 2016-01-27 NOTE — Patient Instructions (Signed)
GO TO THE LAB : Get the blood work     GO TO THE FRONT DESK Schedule your next appointment for a  checkup in 2 months  For  neuropathy and difficulty sleeping: Take gabapentin 100 mg: 1 tablet at bedtime every night. Okay to increase to 2 or 3 tablets at bedtime if needed.

## 2016-01-27 NOTE — Progress Notes (Signed)
Pre visit review using our clinic review tool, if applicable. No additional management support is needed unless otherwise documented below in the visit note. 

## 2016-01-28 ENCOUNTER — Ambulatory Visit (INDEPENDENT_AMBULATORY_CARE_PROVIDER_SITE_OTHER): Payer: Medicare Other | Admitting: Orthopedic Surgery

## 2016-01-28 ENCOUNTER — Encounter (INDEPENDENT_AMBULATORY_CARE_PROVIDER_SITE_OTHER): Payer: Self-pay | Admitting: Orthopedic Surgery

## 2016-01-28 DIAGNOSIS — I87332 Chronic venous hypertension (idiopathic) with ulcer and inflammation of left lower extremity: Secondary | ICD-10-CM

## 2016-01-28 DIAGNOSIS — L97929 Non-pressure chronic ulcer of unspecified part of left lower leg with unspecified severity: Principal | ICD-10-CM

## 2016-01-28 LAB — BASIC METABOLIC PANEL
BUN: 41 mg/dL — AB (ref 6–23)
CO2: 24 mEq/L (ref 19–32)
CREATININE: 1.59 mg/dL — AB (ref 0.40–1.20)
Calcium: 9 mg/dL (ref 8.4–10.5)
Chloride: 97 mEq/L (ref 96–112)
GFR: 33.1 mL/min — AB (ref >60.00)
GLUCOSE: 142 mg/dL — AB (ref 70–99)
POTASSIUM: 5 meq/L (ref 3.5–5.1)
Sodium: 129 mEq/L — ABNORMAL LOW (ref 135–145)

## 2016-01-28 MED ORDER — MUPIROCIN 2 % EX OINT: 1.0000 "application " | TOPICAL_OINTMENT | Freq: Two times a day (BID) | CUTANEOUS | 6 refills | Status: DC

## 2016-01-28 NOTE — Assessment & Plan Note (Signed)
PLAN: DM: Recent creatinine increased, I recommended to decrease metformin dose but she stopped, has some reservations about metformin. Plan: Continue glimepiride, check A1c in 2 or 3 months when she comes back. D/t age we won't aim for strict control. Neuropathy: As described above. Recommend a trial with gabapentin: Start with 100 mg then go to 200 mg or 300 mg as needed. See instructions. Insomnia: Previously Xanax help however will start Neurontin and I hope that is going to help her sleep as well. HTN: Recent creatinine increased, cardiology decreased Lasix to 20 mg and stopped potassium. So far she is doing well with no worse edema and BP is okay. Check a BMP  Poor mobility: Refer to physical therapy  Anemia: Worsening, has an appointment to see hematology Flu shot today RTC 2 months

## 2016-01-28 NOTE — Progress Notes (Signed)
Wound Care Note   Patient: [name] [name]           Date of Birth: August 09, 1932           MRN: [id number]             PCP: Kathlene November, MD Visit Date: 01/28/2016   Assessment & Plan: Visit Diagnoses:  1. Idiopathic chronic venous hypertension of left lower extremity with ulcer and inflammation (HCC)     Plan: Continue ace wrapping and daily wound cleansing. Have refilled the Mupirocin ointment which she will apply daily. Continue to elevate as able. Follow up in 2 more weeks.   Follow-Up Instructions: Return in about 2 weeks (around 02/11/2016).  Orders:  No orders of the defined types were placed in this encounter.  No orders of the defined types were placed in this encounter.     Procedures: No notes on file   Clinical Data: No additional findings.   No images are attached to the encounter.   Subjective: Chief Complaint  Patient presents with  . Left Foot - Wound Check    Patient is an 82 year old woman seen today forevaluation left midfoot plantar ulceration with lymphedema to the left lower extremity as well. She is wrapping with ace bandage from forefoot to knee. Daughter who accompanies cleanses daily with dial soap and applies neosporin. She is currently taking bactrim DS and has about a week left of this medication.   She has followed up with her medical doctor and has since discontinued some medication including metformin. They are arranging hematology referral due to anemia for iron transfusion.    Review of Systems  Constitutional: Negative for chills and fever.  Cardiovascular: Positive for leg swelling.  Skin: Positive for wound.      Objective: Vital Signs: There were no vitals taken for this visit.  Physical Exam: Patient is alert and oriented. No adenopathy. Well-dressed. Normal affect. Respirations easy.  Left lower extremity with improved swelling since last visit. Does have pitting edema. Medially there is an ulceration that extends  plantarly. This is increased in size. Is 2 cm in diameter and 1 mm deep. There is 30 % exudative tissue in wound bed. Remainder is granulation tissue. Little surrounding erythema. No cellulitis. No drainage. No odor.   Specialty Comments: No specialty comments available.   PMFS History: Patient Active Problem List   Diagnosis Date Noted  . Midfoot ulcer, left, limited to breakdown of skin ([place]) 01/13/2016  . Idiopathic chronic venous hypertension of left lower extremity with ulcer and inflammation ([place]) 01/13/2016  . PCP NOTES >>>>>>>>>>>>>>>>>>>>> 09/30/2015  . Annual physical exam 08/28/2013  . Elevated alkaline phosphatase level 12/19/2012  . UTI (urinary tract infection) 11/20/2012  . Unspecified constipation 07/11/2012  . Diabetic foot ulcer- LEFT-s/p amputation first Ray 2014 Dr. Sharol Given 04/11/2012  . Coronary artery disease 09/05/2010  . GERD (gastroesophageal reflux disease) 07/01/2010  . Anemia, iron deficiency 11/06/2009  . CT, CHEST, ABNORMAL 06/10/2009  . CARDIOMYOPATHY, ISCHEMIC 04/14/2009  . Chronic systolic heart failure ([place]) 04/14/2009  . AUTOMATIC IMPLANTABLE CARDIAC DEFIBRILLATOR SITU 04/14/2009  . DIVERTICULOSIS OF COLON 07/18/2008  . LIVER FUNCTION TESTS, ABNORMAL, HX OF 07/18/2008  . INSOMNIA-SLEEP DISORDER-UNSPEC 11/05/2007  . GAIT DISTURBANCE 06/07/2007  . CHOLELITHIASIS 03/09/2007  . Carotid artery disease ([place]) 02/12/2007  . DM II (diabetes mellitus, type II), controlled ([place]) 10/27/2006  . Dyslipidemia 10/27/2006  . HTN (hypertension) 10/27/2006   Past Medical History:  Diagnosis Date  . Abnormal CT  scan, chest    last 06-08-2009, see report   . Anemia    iron def   . Barrett esophagus    EGD 08/2009, next 2013  . CAD (coronary artery disease)    s/p stent R side 9/08, re stenosis 1/09, s/p angioplasty 1/09 and a stent on 12/09; re-angioplasty 5/11  . Carotid arterial disease (HCC)    s/p stent R side 9-08, re stenosis 1-09, s/p angioplasty 1-09 and a  stent on 12-09, last  angiogram 06-24-2009  . CHF (congestive heart failure) ([place])   . Collagen vascular disease ([place])   . Diabetes mellitus   . Diverticulitis 12/08  . GERD (gastroesophageal reflux disease)    occ  . Hyperlipidemia   . Hypertension   . ICD (implantable cardiac defibrillator) in place     12-08-- [employer]   . 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 Stroke [facility])     Family History  Problem Relation Age of Onset  . Breast cancer Mother 86  . Tuberculosis Father   . Colon cancer Neg Hx    Past Surgical History:  Procedure Laterality Date  . AMPUTATION Left 06/08/2012   Procedure: AMPUTATION RAY;  Surgeon: Newt Minion, MD;  Location: [place];  Service: Orthopedics;  Laterality: Left;  Left Foot 1st Ray Amputation  . ARTERIAL BYPASS SURGRY     08/08/2000  LIMA to LAD, SVG to intermediate, SVG to OM2, SVG to distal RCA  . CARDIAC DEFIBRILLATOR PLACEMENT     [employer]   Social History   Occupational History  . n/a    Social History Main Topics  . Smoking status: Never Smoker  . Smokeless tobacco: Never Used     Comment: never used tobacco  . Alcohol use No  . Drug use: No  . Sexual activity: No

## 2016-02-03 ENCOUNTER — Other Ambulatory Visit: Payer: Self-pay | Admitting: *Deleted

## 2016-02-03 DIAGNOSIS — D509 Iron deficiency anemia, unspecified: Secondary | ICD-10-CM

## 2016-02-04 ENCOUNTER — Other Ambulatory Visit (HOSPITAL_BASED_OUTPATIENT_CLINIC_OR_DEPARTMENT_OTHER): Payer: Medicare Other

## 2016-02-04 ENCOUNTER — Ambulatory Visit (HOSPITAL_BASED_OUTPATIENT_CLINIC_OR_DEPARTMENT_OTHER): Payer: Medicare Other | Admitting: Family

## 2016-02-04 VITALS — BP 134/72 | HR 76 | Temp 97.9°F | Resp 18 | Wt 181.0 lb

## 2016-02-04 DIAGNOSIS — D649 Anemia, unspecified: Secondary | ICD-10-CM | POA: Diagnosis not present

## 2016-02-04 DIAGNOSIS — N289 Disorder of kidney and ureter, unspecified: Secondary | ICD-10-CM | POA: Diagnosis not present

## 2016-02-04 DIAGNOSIS — R609 Edema, unspecified: Secondary | ICD-10-CM | POA: Diagnosis not present

## 2016-02-04 DIAGNOSIS — D509 Iron deficiency anemia, unspecified: Secondary | ICD-10-CM

## 2016-02-04 DIAGNOSIS — D500 Iron deficiency anemia secondary to blood loss (chronic): Secondary | ICD-10-CM

## 2016-02-04 LAB — IRON AND TIBC
%SAT: 7 % — AB (ref 21–57)
IRON: 17 ug/dL — AB (ref 41–142)
TIBC: 263 ug/dL (ref 236–444)
UIBC: 245 ug/dL (ref 120–384)

## 2016-02-04 LAB — CBC WITH DIFFERENTIAL (CANCER CENTER ONLY)
BASO#: 0 10*3/uL (ref 0.0–0.2)
BASO%: 0.3 % (ref 0.0–2.0)
EOS ABS: 0.3 10*3/uL (ref 0.0–0.5)
EOS%: 2.8 % (ref 0.0–7.0)
HEMATOCRIT: 22.9 % — AB (ref 34.8–46.6)
HEMOGLOBIN: 7.2 g/dL — AB (ref 11.6–15.9)
LYMPH#: 1.7 10*3/uL (ref 0.9–3.3)
LYMPH%: 18.1 % (ref 14.0–48.0)
MCH: 25.4 pg — AB (ref 26.0–34.0)
MCHC: 31.4 g/dL — ABNORMAL LOW (ref 32.0–36.0)
MCV: 81 fL (ref 81–101)
MONO#: 0.8 10*3/uL (ref 0.1–0.9)
MONO%: 8.3 % (ref 0.0–13.0)
NEUT%: 70.5 % (ref 39.6–80.0)
NEUTROS ABS: 6.7 10*3/uL — AB (ref 1.5–6.5)
Platelets: 209 10*3/uL (ref 145–400)
RBC: 2.84 10*6/uL — AB (ref 3.70–5.32)
RDW: 15.2 % (ref 11.1–15.7)
WBC: 9.5 10*3/uL (ref 3.9–10.0)

## 2016-02-04 LAB — FERRITIN: FERRITIN: 54 ng/mL (ref 9–269)

## 2016-02-04 NOTE — Progress Notes (Signed)
Hematology and Oncology Follow Up Visit  [name] [name] [id number] Sep 18, 1932 82 y.o. 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 She is doing fairly well. She is currently on Bactrim for a left foot ulcer. She states that her daughter cleans and wraps this every day and that she follows up with [name] [name] every 2 weeks.  She still has chronic swelling in both lower extremities. Pedal pulses are +1. She is taking lasix to help with fluid retention.  She is still able to get up and walk some with her walker. She live at home and her children take turns staying with her so she is never home alone.  Her Hgb is now down to 7.2. I did a rectal exam on her and she was hemoccult positive. She has two large external hemorrhoids that are visible and one is red and enflamed. She states that several years ago she was referred to [name] [name] for colonoscopy but was not considered stable enough from a cardiac standpoint.  She has neuropathy in her feet. Her blood sugars are still not well controlled.  No lymphadenopathy found on example.  She has maintained a good appetite and is staying well hydrated. Her weight is stable.  No fever, chills, n/v, cough, rash, dizziness, headache, chest pain, palpitations, abdominal pain, changes in bowel or bladder habits.  Medications:    Medication List       Accurate as of 02/04/16 10:06 AM. Always use your most recent med list.          acetaminophen 500 MG tablet Commonly known as:  TYLENOL Take 1,000 mg by mouth every 8 (eight) hours as needed.   aspirin 325 MG tablet Take 325 mg by mouth daily.   atorvastatin 40 MG tablet Commonly known as:   LIPITOR Take 1 tablet (40 mg total) by mouth daily.   clopidogrel 75 MG tablet Commonly known as:  PLAVIX Take 1 tablet (75 mg total) by mouth daily.   docusate sodium 100 MG capsule Commonly known as:  COLACE Take 100 mg by mouth daily as needed for mild constipation.   furosemide 20 MG tablet Commonly known as:  LASIX Take 1 tablet (20 mg total) by mouth daily.   gabapentin 100 MG capsule Commonly known as:  NEURONTIN Take 3 capsules (300 mg total) by mouth at bedtime as needed.   glimepiride 1 MG tablet Commonly known as:  AMARYL Take 1 tablet (1 mg total) by mouth daily with breakfast.   glucose blood test strip Check blood sugars twice daily.   metoprolol 50 MG tablet Commonly known as:  LOPRESSOR Take 1 tablet (50 mg total) by mouth 2 (two) times daily.   mupirocin ointment 2 % Commonly known as:  BACTROBAN Apply 1 application topically 2 (two) times daily.   nitroGLYCERIN 0.4 MG SL tablet Commonly known as:  NITROSTAT Place 1 tablet (0.4 mg total) under the tongue every 5 (five) minutes as needed for chest pain. Contact your doctor and/or seek medial attention right away if chest pain   sulfamethoxazole-trimethoprim 800-160 MG tablet Commonly known as:  BACTRIM DS,SEPTRA DS Take 1 tablet by mouth 2 (two) times daily.       Allergies: No  Known Allergies  Past Medical History, Surgical history, Social history, and Family History were reviewed and updated.  Review of Systems: All other 10 point review of systems is negative.   Physical Exam:  weight is 181 lb (82.1 kg). Her oral temperature is 97.9 F (36.6 C). Her blood pressure is 134/72 and her pulse is 76. Her respiration is 18 and oxygen saturation is 99%.   Wt Readings from Last 3 Encounters:  02/04/16 181 lb (82.1 kg)  01/27/16 181 lb (82.1 kg)  01/25/16 180 lb (81.6 kg)    Ocular: Sclerae unicteric, pupils equal, round and reactive to light Ear-nose-throat: Oropharynx clear, dentition  fair Lymphatic: No cervical supraclavicular or axillary adenopathy Lungs no rales or rhonchi, good excursion bilaterally Heart regular rate and rhythm, no murmur appreciated Abd soft, nontender, positive bowel sounds, no liver or spleen tip palpated on exam, no fluid wave  MSK no focal spinal tenderness, no joint edema Neuro: non-focal, well-oriented, appropriate affect Breasts: Deferred  Lab Results  Component Value Date   WBC 9.5 02/04/2016   HGB 7.2 (L) 02/04/2016   HCT 22.9 (L) 02/04/2016   MCV 81 02/04/2016   PLT 209 02/04/2016   Lab Results  Component Value Date   FERRITIN 94.4 09/30/2015   IRON 57 09/30/2015   TIBC 234 (L) 11/25/2014   UIBC 211 11/25/2014   IRONPCTSAT 10 (L) 11/25/2014   Lab Results  Component Value Date   RETICCTPCT 1.1 11/25/2014   RBC 2.84 (L) 02/04/2016   RETICCTABS 35.6 11/25/2014   No results found for: KPAFRELGTCHN, LAMBDASER, KAPLAMBRATIO No results found for: IGGSERUM, IGA, IGMSERUM No results found for: TOTALPROTELP, ALBUMINELP, A1GS, A2GS, BETS, BETA2SER, GAMS, MSPIKE, SPEI   Chemistry      Component Value Date/Time   NA 129 (L) 01/27/2016 1516   K 5.0 01/27/2016 1516   CL 97 01/27/2016 1516   CO2 24 01/27/2016 1516   BUN 41 (H) 01/27/2016 1516   CREATININE 1.59 (H) 01/27/2016 1516   CREATININE 1.56 (H) 01/20/2016 1639      Component Value Date/Time   CALCIUM 9.0 01/27/2016 1516   ALKPHOS 138 (H) 02/19/2015 1248   AST 15 02/19/2015 1248   ALT 12 (L) 02/19/2015 1248   BILITOT 0.4 02/19/2015 1248     Impression and [name]: [name] [name] is a very charming 82 year old white female with multifactorial anemia (iron deficiency/renal insufficiency). Her Hgb today is down at 7.2. Her hemoccult testing today was positive with two large external hemorrhoids. We will [name] to give her 2 units PRBC's Tuesday next week (12/19). She has no complaints at this time and is asymptomatic. No s/s of distress with her anemia.  Iron studies are pending. We  will bring her back in for infusion if needed next week.  We will see her back in 1 month for repeat lab work and follow-up.  Both she and her daughter know to contact us with any questions or concerns. We can certainly see her sooner if need be.   [name] Bottom, NP 12/14/201710:06 AM

## 2016-02-05 LAB — RETICULOCYTES: RETICULOCYTE COUNT: 1.8 % (ref 0.6–2.6)

## 2016-02-08 ENCOUNTER — Telehealth: Payer: Self-pay | Admitting: *Deleted

## 2016-02-08 ENCOUNTER — Other Ambulatory Visit: Payer: Self-pay | Admitting: *Deleted

## 2016-02-08 DIAGNOSIS — I87332 Chronic venous hypertension (idiopathic) with ulcer and inflammation of left lower extremity: Secondary | ICD-10-CM

## 2016-02-08 DIAGNOSIS — L97929 Non-pressure chronic ulcer of unspecified part of left lower leg with unspecified severity: Principal | ICD-10-CM

## 2016-02-08 NOTE — Telephone Encounter (Signed)
Scheduled appt for 03/08/16

## 2016-02-09 ENCOUNTER — Other Ambulatory Visit: Payer: Self-pay | Admitting: Family

## 2016-02-09 ENCOUNTER — Ambulatory Visit (HOSPITAL_COMMUNITY)
Admission: RE | Admit: 2016-02-09 | Discharge: 2016-02-09 | Disposition: A | Discharge status: Home / Self Care | Payer: Medicare Other | Source: Ambulatory Visit | Attending: Hematology & Oncology | Admitting: Hematology & Oncology

## 2016-02-09 ENCOUNTER — Ambulatory Visit (HOSPITAL_BASED_OUTPATIENT_CLINIC_OR_DEPARTMENT_OTHER): Payer: Medicare Other

## 2016-02-09 ENCOUNTER — Encounter: Payer: Self-pay | Admitting: Hematology & Oncology

## 2016-02-09 ENCOUNTER — Ambulatory Visit (INDEPENDENT_AMBULATORY_CARE_PROVIDER_SITE_OTHER): Payer: Medicare Other | Admitting: Orthopedic Surgery

## 2016-02-09 ENCOUNTER — Other Ambulatory Visit (HOSPITAL_BASED_OUTPATIENT_CLINIC_OR_DEPARTMENT_OTHER): Payer: Medicare Other

## 2016-02-09 VITALS — BP 155/46 | HR 63 | Temp 97.5°F | Resp 18

## 2016-02-09 DIAGNOSIS — D509 Iron deficiency anemia, unspecified: Secondary | ICD-10-CM

## 2016-02-09 DIAGNOSIS — D500 Iron deficiency anemia secondary to blood loss (chronic): Secondary | ICD-10-CM

## 2016-02-09 DIAGNOSIS — N189 Chronic kidney disease, unspecified: Secondary | ICD-10-CM | POA: Diagnosis not present

## 2016-02-09 DIAGNOSIS — L97929 Non-pressure chronic ulcer of unspecified part of left lower leg with unspecified severity: Principal | ICD-10-CM

## 2016-02-09 DIAGNOSIS — I87332 Chronic venous hypertension (idiopathic) with ulcer and inflammation of left lower extremity: Secondary | ICD-10-CM

## 2016-02-09 LAB — ABO/RH: ABO/RH(D): O NEG

## 2016-02-09 LAB — PREPARE RBC (CROSSMATCH)

## 2016-02-09 MED ORDER — SODIUM CHLORIDE 0.9 % IV SOLN
250.0000 mL | Freq: Once | INTRAVENOUS | Status: AC
  Administered 2016-02-09: 250 mL via INTRAVENOUS

## 2016-02-09 MED ORDER — SODIUM CHLORIDE 0.9% FLUSH
10.0000 mL | INTRAVENOUS | Status: DC | PRN
  Filled 2016-02-09: qty 10

## 2016-02-09 MED ORDER — FUROSEMIDE 10 MG/ML IJ SOLN: 20.0000 mg | Freq: Once | INTRAMUSCULAR | Status: DC

## 2016-02-09 MED ORDER — DIPHENHYDRAMINE HCL 25 MG PO CAPS
ORAL_CAPSULE | ORAL | Status: AC
  Filled 2016-02-09: qty 1

## 2016-02-09 MED ORDER — DIPHENHYDRAMINE HCL 25 MG PO CAPS
25.0000 mg | ORAL_CAPSULE | Freq: Once | ORAL | Status: AC
  Administered 2016-02-09: 25 mg via ORAL

## 2016-02-09 MED ORDER — ACETAMINOPHEN 325 MG PO TABS
650.0000 mg | ORAL_TABLET | Freq: Once | ORAL | Status: AC
  Administered 2016-02-09: 650 mg via ORAL

## 2016-02-09 MED ORDER — FUROSEMIDE 10 MG/ML IJ SOLN
INTRAMUSCULAR | Status: AC
  Filled 2016-02-09: qty 4

## 2016-02-09 MED ORDER — SODIUM CHLORIDE 0.9 % IV SOLN
510.0000 mg | Freq: Once | INTRAVENOUS | Status: AC
  Administered 2016-02-09: 510 mg via INTRAVENOUS
  Filled 2016-02-09: qty 17

## 2016-02-09 MED ORDER — HEPARIN SOD (PORK) LOCK FLUSH 100 UNIT/ML IV SOLN
500.0000 [IU] | Freq: Every day | INTRAVENOUS | Status: DC | PRN
  Filled 2016-02-09: qty 5

## 2016-02-09 MED ORDER — ACETAMINOPHEN 325 MG PO TABS
ORAL_TABLET | ORAL | Status: AC
  Filled 2016-02-09: qty 2

## 2016-02-10 ENCOUNTER — Ambulatory Visit (HOSPITAL_BASED_OUTPATIENT_CLINIC_OR_DEPARTMENT_OTHER): Payer: Medicare Other

## 2016-02-10 ENCOUNTER — Other Ambulatory Visit: Payer: Self-pay | Admitting: Family

## 2016-02-10 DIAGNOSIS — D500 Iron deficiency anemia secondary to blood loss (chronic): Secondary | ICD-10-CM

## 2016-02-10 DIAGNOSIS — D509 Iron deficiency anemia, unspecified: Secondary | ICD-10-CM

## 2016-02-10 MED ORDER — DIPHENHYDRAMINE HCL 25 MG PO CAPS
25.0000 mg | ORAL_CAPSULE | Freq: Once | ORAL | Status: AC
  Administered 2016-02-10: 25 mg via ORAL

## 2016-02-10 MED ORDER — ACETAMINOPHEN 325 MG PO TABS
ORAL_TABLET | ORAL | Status: AC
  Filled 2016-02-10: qty 2

## 2016-02-10 MED ORDER — FUROSEMIDE 10 MG/ML IJ SOLN: 20.0000 mg | Freq: Once | INTRAMUSCULAR | Status: DC

## 2016-02-10 MED ORDER — SODIUM CHLORIDE 0.9 % IV SOLN
250.0000 mL | Freq: Once | INTRAVENOUS | Status: AC
  Administered 2016-02-10: 250 mL via INTRAVENOUS

## 2016-02-10 MED ORDER — ACETAMINOPHEN 325 MG PO TABS
650.0000 mg | ORAL_TABLET | Freq: Once | ORAL | Status: AC
  Administered 2016-02-10: 650 mg via ORAL

## 2016-02-10 MED ORDER — DIPHENHYDRAMINE HCL 25 MG PO CAPS
ORAL_CAPSULE | ORAL | Status: AC
  Filled 2016-02-10: qty 1

## 2016-02-10 NOTE — Patient Instructions (Signed)
Blood Transfusion , Adult A blood transfusion is a procedure in which you receive donated blood, including plasma, platelets, and red blood cells, through an IV tube. You may need a blood transfusion because of illness, surgery, or injury. The blood may come from a donor. You may also be able to donate blood for yourself (autologous blood donation) before a surgery if you know that you might require a blood transfusion. The blood given in a transfusion is made up of different types of cells. You may receive:  Red blood cells. These carry oxygen to the cells in the body.  White blood cells. These help you fight infections.  Platelets. These help your blood to clot.  Plasma. This is the liquid part of your blood and it helps with fluid imbalances. If you have hemophilia or another clotting disorder, you may also receive other types of blood products. Tell a health care provider about:  Any allergies you have.  All medicines you are taking, including vitamins, herbs, eye drops, creams, and over-the-counter medicines.  Any problems you or family members have had with anesthetic medicines.  Any blood disorders you have.  Any surgeries you have had.  Any medical conditions you have, including any recent fever or cold symptoms.  Whether you are pregnant or may be pregnant.  Any previous reactions you have had during a blood transfusion. What are the risks? Generally, this is a safe procedure. However, problems may occur, including:  Having an allergic reaction to something in the donated blood. Hives and itching may be symptoms of this type of reaction.  Fever. This may be a reaction to the white blood cells in the transfused blood. Nausea or chest pain may accompany a fever.  Iron overload. This can happen from having many transfusions.  Transfusion-related acute lung injury (TRALI). This is a rare reaction that causes lung damage. The cause is not known.TRALI can occur within hours  of a transfusion or several days later.  Sudden (acute) or delayed hemolytic reactions. This happens if your blood does not match the cells in your transfusion. Your body's defense system (immune system) may try to attack the new cells. This complication is rare. The symptoms include fever, chills, nausea, and low back pain or chest pain.  Infection or disease transmission. This is rare. What happens before the procedure?  You will have a blood test to determine your blood type. This is necessary to know what kind of blood your body will accept and to match it to the donor blood.  If you are going to have a planned surgery, you may be able to do an autologous blood donation. This may be done in case you need to have a transfusion.  If you have had an allergic reaction to a transfusion in the past, you may be given medicine to help prevent a reaction. This medicine may be given to you by mouth or through an IV tube.  You will have your temperature, blood pressure, and pulse monitored before the transfusion.  Follow instructions from your health care provider about eating and drinking restrictions.  Ask your health care provider about:  Changing or stopping your regular medicines. This is especially important if you are taking diabetes medicines or blood thinners.  Taking medicines such as aspirin and ibuprofen. These medicines can thin your blood. Do not take these medicines before your procedure if your health care provider instructs you not to. What happens during the procedure?  An IV tube will be   inserted into one of your veins.  The bag of donated blood will be attached to your IV tube. The blood will then enter through your vein.  Your temperature, blood pressure, and pulse will be monitored regularly during the transfusion. This monitoring is done to detect early signs of a transfusion reaction.  If you have any signs or symptoms of a reaction, your transfusion will be stopped and  you may be given medicine.  When the transfusion is complete, your IV tube will be removed.  Pressure may be applied to the IV site for a few minutes.  A bandage (dressing) will be applied. The procedure may vary among health care providers and hospitals. What happens after the procedure?  Your temperature, blood pressure, heart rate, breathing rate, and blood oxygen level will be monitored often.  Your blood may be tested to see how you are responding to the transfusion.  You may be warmed with fluids or blankets to maintain a normal body temperature. Summary  A blood transfusion is a procedure in which you receive donated blood, including plasma, platelets, and red blood cells, through an IV tube.  Your temperature, blood pressure, and pulse will be monitored before, during, and after the transfusion.  Your blood may be tested after the transfusion to see how your body has responded. This information is not intended to replace advice given to you by your health care provider. Make sure you discuss any questions you have with your health care provider. Document Released: 02/05/2000 Document Revised: 11/05/2015 Document Reviewed: 11/05/2015 Elsevier Interactive Patient Education  2017 [employer]  

## 2016-02-11 ENCOUNTER — Other Ambulatory Visit (HOSPITAL_COMMUNITY): Payer: Medicare Other

## 2016-02-11 ENCOUNTER — Encounter: Payer: Self-pay | Admitting: Hematology & Oncology

## 2016-02-11 ENCOUNTER — Ambulatory Visit (INDEPENDENT_AMBULATORY_CARE_PROVIDER_SITE_OTHER): Payer: Medicare Other | Admitting: Orthopedic Surgery

## 2016-02-11 LAB — TYPE AND SCREEN
ABO/RH(D): O NEG
Antibody Screen: NEGATIVE
Unit division: 0
Unit division: 0

## 2016-02-17 ENCOUNTER — Other Ambulatory Visit: Payer: Medicare Other

## 2016-02-18 ENCOUNTER — Ambulatory Visit (INDEPENDENT_AMBULATORY_CARE_PROVIDER_SITE_OTHER): Payer: Medicare Other | Admitting: Orthopedic Surgery

## 2016-02-19 ENCOUNTER — Other Ambulatory Visit: Payer: Medicare Other

## 2016-02-19 ENCOUNTER — Ambulatory Visit: Payer: Medicare Other

## 2016-02-19 ENCOUNTER — Ambulatory Visit: Payer: Medicare Other | Admitting: Hematology & Oncology

## 2016-02-24 ENCOUNTER — Ambulatory Visit: Payer: Medicare Other | Admitting: Physical Therapy

## 2016-02-25 ENCOUNTER — Ambulatory Visit (INDEPENDENT_AMBULATORY_CARE_PROVIDER_SITE_OTHER): Payer: Medicare Other | Admitting: Orthopedic Surgery

## 2016-03-01 ENCOUNTER — Telehealth: Payer: Self-pay | Admitting: Physical Therapy

## 2016-03-01 NOTE — Telephone Encounter (Signed)
02/24/16 pt cxl PT eval. Unable to leave message on phone 03/01/16.

## 2016-03-03 ENCOUNTER — Encounter (INDEPENDENT_AMBULATORY_CARE_PROVIDER_SITE_OTHER): Payer: Self-pay | Admitting: Orthopedic Surgery

## 2016-03-03 ENCOUNTER — Ambulatory Visit (INDEPENDENT_AMBULATORY_CARE_PROVIDER_SITE_OTHER): Payer: Medicare Other | Admitting: Family

## 2016-03-03 DIAGNOSIS — L97929 Non-pressure chronic ulcer of unspecified part of left lower leg with unspecified severity: Secondary | ICD-10-CM

## 2016-03-03 DIAGNOSIS — B351 Tinea unguium: Secondary | ICD-10-CM | POA: Diagnosis not present

## 2016-03-03 DIAGNOSIS — L97421 Non-pressure chronic ulcer of left heel and midfoot limited to breakdown of skin: Secondary | ICD-10-CM

## 2016-03-03 DIAGNOSIS — I87332 Chronic venous hypertension (idiopathic) with ulcer and inflammation of left lower extremity: Secondary | ICD-10-CM

## 2016-03-03 MED ORDER — MUPIROCIN 2 % EX OINT: 1.0000 "application " | TOPICAL_OINTMENT | Freq: Two times a day (BID) | CUTANEOUS | 6 refills | Status: DC

## 2016-03-03 NOTE — Progress Notes (Signed)
Office Visit Note   Patient: [name] [name]           Date of Birth: [birth year]           MRN: [id number] Visit Date: 03/03/2016              Requested by: Colon Branch, MD [address] [place], [place] [address] PCP: Kathlene November, MD   Assessment & Plan: Visit Diagnoses:  1. Midfoot ulcer, left, limited to breakdown of skin ([place])   2. Onychomycosis of toenail     Plan: Continue with daily wound cleansing. Continue compression to bilateral lower extremities.   Follow-Up Instructions: Return in about 4 weeks (around 03/31/2016).   Orders:  No orders of the defined types were placed in this encounter.  No orders of the defined types were placed in this encounter.     Procedures: No procedures performed   Clinical Data: No additional findings.   Subjective: Chief Complaint  Patient presents with  . Left Foot - Follow-up  . Follow-up    Patient is a 83 year old woman seen today in follow up for medial ulceration to the left foot. Has been applying mupirocin and gauze and an Ace wrap for compression. Is wearing compression stockings on the right lower extremity. This predominantly nonweightbearing in a wheelchair. Her primary care physician has ordered some physical therapy to work with gait strengthening she is wondering if she should proceed with this.    Review of Systems  Constitutional: Negative for chills and fever.  Cardiovascular: Positive for leg swelling.  Skin: Positive for wound.     Objective: Vital Signs: There were no vitals taken for this visit.  Physical Exam  Constitutional: She is oriented to person, place, and time. She appears well-developed and well-nourished.  Pulmonary/Chest: Effort normal.  Musculoskeletal:  Pitting edema to bilateral lower extremities with brawny skin color changes and dermatitis. No erythema weeping or cellulitis. The left medial midfoot ulcer is 17 mm x 8 mm. Granulation tissue with scant exudative tissue in  wound bed. No depth. No drainage, surrounding erythema odor or sign of infection.   Does have thickened and discolored onychomycotic nails x 9. Is unable to safely trim own nails. These were trimmed today x 9.  Neurological: She is alert and oriented to person, place, and time.  Psychiatric: She has a normal mood and affect.  Nursing note reviewed.   Ortho Exam  Specialty Comments:  No specialty comments available.  Imaging: No results found.   PMFS History: Patient Active Problem List   Diagnosis Date Noted  . Onychomycosis of toenail 03/03/2016  . Midfoot ulcer, left, limited to breakdown of skin ([place]) 01/13/2016  . Idiopathic chronic venous hypertension of left lower extremity with ulcer and inflammation ([place]) 01/13/2016  . PCP NOTES >>>>>>>>>>>>>>>>>>>>> 09/30/2015  . Annual physical exam 08/28/2013  . Elevated alkaline phosphatase level 12/19/2012  . Unspecified constipation 07/11/2012  . Diabetic foot ulcer- LEFT-s/p amputation first Ray 2014 Dr. Sharol Given 04/11/2012  . Coronary artery disease 09/05/2010  . GERD (gastroesophageal reflux disease) 07/01/2010  . Anemia, iron deficiency 11/06/2009  . CT, CHEST, ABNORMAL 06/10/2009  . CARDIOMYOPATHY, ISCHEMIC 04/14/2009  . Chronic systolic heart failure ([place]) 04/14/2009  . AUTOMATIC IMPLANTABLE CARDIAC DEFIBRILLATOR SITU 04/14/2009  . DIVERTICULOSIS OF COLON 07/18/2008  . LIVER FUNCTION TESTS, ABNORMAL, HX OF 07/18/2008  . Insomnia 11/05/2007  . GAIT DISTURBANCE 06/07/2007  . CHOLELITHIASIS 03/09/2007  . Carotid artery disease ([place]) 02/12/2007  .  DM II (diabetes mellitus, type II), controlled ([place]) 10/27/2006  . Dyslipidemia 10/27/2006  . HTN (hypertension) 10/27/2006   Past Medical History:  Diagnosis Date  . Abnormal CT scan, chest    last 06-08-2009, see report   . Anemia    iron def   . Barrett esophagus    EGD 08/2009, next 2013  . CAD (coronary artery disease)    s/p stent R side 9/08, re stenosis 1/09, s/p  angioplasty 1/09 and a stent on 12/09; re-angioplasty 5/11  . Carotid arterial disease ([facility])    s/p stent R side 9-08, re stenosis 1-09, s/p angioplasty 1-09 and a stent on 12-09, last  angiogram 06-24-2009  . CHF (congestive heart failure) ([place])   . Collagen vascular disease ([place])   . Diabetes mellitus   . Diverticulitis 12/08  . GERD (gastroesophageal reflux disease)    occ  . Hyperlipidemia   . Hypertension   . ICD (implantable cardiac defibrillator) in place     12-08-- [employer]   . 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 Stroke [facility])     Family History  Problem Relation Age of Onset  . Breast cancer Mother 33  . Tuberculosis Father   . Colon cancer Neg Hx     Past Surgical History:  Procedure Laterality Date  . AMPUTATION Left 06/08/2012   Procedure: AMPUTATION RAY;  Surgeon: Newt Minion, MD;  Location: [facility];  Service: Orthopedics;  Laterality: Left;  Left Foot 1st Ray Amputation  . ARTERIAL BYPASS SURGRY     08/08/2000  LIMA to LAD, SVG to intermediate, SVG to OM2, SVG to distal RCA  . CARDIAC DEFIBRILLATOR PLACEMENT     [employer]   Social History   Occupational History  . n/a    Social History Main Topics  . Smoking status: Never Smoker  . Smokeless tobacco: Never Used     Comment: never used tobacco  . Alcohol use No  . Drug use: No  . Sexual activity: No

## 2016-03-03 NOTE — Addendum Note (Signed)
Addended by: Dondra Prader R on: 03/03/2016 02:49 PM   Modules accepted: Orders

## 2016-03-07 NOTE — Progress Notes (Addendum)
Subjective:   [name] [name] is a 83 y.o. female who presents for Medicare Annual (Subsequent) preventive examination.  Review of Systems:  No ROS.  Medicare Wellness Visit. Cardiac Risk Factors include: advanced age (>38men, >62 women);diabetes mellitus;dyslipidemia;hypertension;sedentary lifestyle Sleep patterns: Has difficulty sleeping. Is going to discuss w/Dr.Paz at next appt. Home Safety/Smoke Alarms: Feels safe in home. Smoke alarms in place.   Living environment; residence and Firearm Safety: Live at home. Son lives with her. One story. No guns. Seat Belt Safety/Bike Helmet: Wears seat belt.   Counseling:   Eye Exam- Pt states she will make an appt. Discussed importance of diabetic eye exam. Dental- Upper and lower dentures. Gum care discussed.  Female:   Pap- Pt states she no longer gets pap smears.     Mammo-  Pt declines at this time.      Dexa scan- Pt declines at this time.  CCS- Pt states she was told by MD that she does not need screening at this time.    Objective:     Vitals: BP 140/68 (BP Location: Left Arm, Patient Position: Sitting, Cuff Size: Normal)   Pulse 60   Ht 5\' 8"  (1.727 m)   Wt 181 lb (82.1 kg)   SpO2 98%   BMI 27.52 kg/m   Body mass index is 27.52 kg/m.   Tobacco History  Smoking Status  . Never Smoker  Smokeless Tobacco  . Never Used    Comment: never used tobacco     Counseling given: No   Past Medical History:  Diagnosis Date  . Abnormal CT scan, chest    last 06-08-2009, see report   . Anemia    iron def   . Barrett esophagus    EGD 08/2009, next 2013  . CAD (coronary artery disease)    s/p stent R side 9/08, re stenosis 1/09, s/p angioplasty 1/09 and a stent on 12/09; re-angioplasty 5/11  . Carotid arterial disease (HCC)    s/p stent R side 9-08, re stenosis 1-09, s/p angioplasty 1-09 and a stent on 12-09, last  angiogram 06-24-2009  . CHF (congestive heart failure) ([place])   . Collagen vascular disease ([place])   . Diabetes  mellitus   . Diverticulitis 12/08  . GERD (gastroesophageal reflux disease)    occ  . Hyperlipidemia   . Hypertension   . ICD (implantable cardiac defibrillator) in place     12-08-- [employer]   . 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 Stroke [facility])    Past Surgical History:  Procedure Laterality Date  . AMPUTATION Left 06/08/2012   Procedure: AMPUTATION RAY;  Surgeon: Newt Minion, MD;  Location: [facility];  Service: Orthopedics;  Laterality: Left;  Left Foot 1st Ray Amputation  . ARTERIAL BYPASS SURGRY     08/08/2000  LIMA to LAD, SVG to intermediate, SVG to OM2, SVG to distal RCA  . CARDIAC DEFIBRILLATOR PLACEMENT     st judes   Family History  Problem Relation Age of Onset  . Breast cancer Mother 9  . Tuberculosis Father   . Colon cancer Neg Hx    History  Sexual Activity  . Sexual activity: No    Outpatient Encounter Prescriptions as of 03/08/2016  Medication Sig  . acetaminophen (TYLENOL) 500 MG tablet Take 1,000 mg by mouth every 8 (eight) hours as needed.  Marland Kitchen aspirin 325 MG tablet Take 325 mg by mouth daily.   Marland Kitchen atorvastatin (LIPITOR) 40 MG tablet Take 1 tablet (40  mg total) by mouth daily.  . clopidogrel (PLAVIX) 75 MG tablet Take 1 tablet (75 mg total) by mouth daily.  . furosemide (LASIX) 20 MG tablet Take 1 tablet (20 mg total) by mouth daily.  Marland Kitchen glimepiride (AMARYL) 1 MG tablet Take 1 tablet (1 mg total) by mouth daily with breakfast.  . glucose blood test strip Check blood sugars twice daily.  . metoprolol (LOPRESSOR) 50 MG tablet Take 1 tablet (50 mg total) by mouth 2 (two) times daily.  . mupirocin ointment (BACTROBAN) 2 % Apply 1 application topically 2 (two) times daily.  Marland Kitchen docusate sodium (COLACE) 100 MG capsule Take 100 mg by mouth daily as needed for mild constipation.  . gabapentin (NEURONTIN) 100 MG capsule Take 3 capsules (300 mg total) by mouth at bedtime as needed. (Patient not taking: Reported on 03/08/2016)  . nitroGLYCERIN (NITROSTAT) 0.4 MG SL  tablet Place 1 tablet (0.4 mg total) under the tongue every 5 (five) minutes as needed for chest pain. Contact your doctor and/or seek medial attention right away if chest pain (Patient not taking: Reported on 01/28/2016)  . [DISCONTINUED] sulfamethoxazole-trimethoprim (BACTRIM DS,SEPTRA DS) 800-160 MG tablet Take 1 tablet by mouth 2 (two) times daily.   No facility-administered encounter medications on file as of 03/08/2016.     Activities of Daily Living In your present state of health, do you have any difficulty performing the following activities: 03/08/2016 01/27/2016  Hearing? N N  Vision? (No Data) N  Difficulty concentrating or making decisions? N N  Walking or climbing stairs? (No Data) Y  Dressing or bathing? N N  Doing errands, shopping? N Y  Conservation officer, nature and eating ? N -  Using the Toilet? N -  In the past six months, have you accidently leaked urine? Y -  Do you have problems with loss of bowel control? N -  Managing your Medications? N -  Managing your Finances? N -  Housekeeping or managing your Housekeeping? Y -  Some recent data might be hidden    Patient Care Team: Colon Branch, MD as PCP - General Newt Minion, MD as Consulting Physician (Orthopedic Surgery) Volanda Napoleon, MD as Consulting Physician (Oncology)    Assessment:    Physical assessment deferred to PCP.  Exercise Activities and Dietary recommendations Current Exercise Habits: The patient does not participate in regular exercise at present, Exercise limited by: orthopedic condition(s) Diet (meal preparation, eat out, water intake, caffeinated beverages, dairy products, fruits and vegetables): in general, a "healthy" diet  , on average, 3 meals per day Breakfast: Cereal or oatmeal.1-2 regular coffee. Lunch: Sandwich  Dinner:  Beans, sweet potato, apple sauce.    Drinks 2-3 bottles of water per day. Goals    . maintain lifestyle      Fall Risk Fall Risk  03/08/2016 01/27/2016 11/25/2014 10/07/2014  05/27/2014  Falls in the past year? No No No No No  Number falls in past yr: - - - - -  Risk Factor Category  - - - - -  Risk for fall due to : - - - - Impaired balance/gait   Depression Screen PHQ 2/9 Scores 03/08/2016 01/27/2016 10/07/2014 08/28/2013  PHQ - 2 Score 0 0 0 0     Cognitive Function MMSE - Mini Mental State Exam 03/08/2016  Orientation to time 5  Orientation to Place 5  Registration 3  Attention/ Calculation 5  Recall 2  Language- name 2 objects 2  Language- repeat 1  Language-  follow 3 step command 3  Language- read & follow direction 1  Write a sentence 1  Copy design 1  Total score 29        Immunization History  Administered Date(s) Administered  . Influenza Split 02/21/2011, 04/12/2012, 01/21/2014  . Influenza Whole 11/04/2009  . Influenza, High Dose Seasonal PF 12/19/2012, 01/27/2016  . Influenza,inj,Quad PF,36+ Mos 12/20/2012, 11/25/2014  . Pneumococcal Conjugate-13 08/28/2013  . Pneumococcal Polysaccharide-23 10/27/2006  . Tetanus 12/31/2012   Screening Tests Health Maintenance  Topic Date Due  . OPHTHALMOLOGY EXAM  04/08/2016 (Originally 11/15/1942)  . DEXA SCAN  04/08/2016 (Originally 11/16/1999)  . ZOSTAVAX  04/08/2016 (Originally 11/16/1994)  . HEMOGLOBIN A1C  04/01/2016  . FOOT EXAM  12/22/2016  . TETANUS/TDAP  01/01/2023  . INFLUENZA VACCINE  Completed  . PNA vac Low Risk Adult  Completed      Plan:     Follow up with Dr.Paz as scheduled 04/07/16. Consider Mammogram and Bone Density Screening. Continue to eat heart healthy diet (full of fruits, vegetables, whole grains, lean protein, water--limit salt, fat, and sugar intake) and increase physical activity as tolerated.  During the course of the visit the patient was educated and counseled about the following appropriate screening and preventive services:   Vaccines to include Pneumoccal, Influenza, Hepatitis B, Td, Zostavax, HCV   Cardiovascular Disease  Colorectal cancer  screening  Bone density screening- dicussed. Pt will consider.  Diabetes screening  Glaucoma screening- Discussed importance. Pt states she will make an appt.   Mammography/PAP-Discussed mammogram. Pt will consider.  Nutrition counseling   Patient Instructions (the written plan) was given to the patient.   Naaman Plummer [place], [place]  03/08/2016  Kathlene November, MD

## 2016-03-07 NOTE — Progress Notes (Signed)
Pre visit review using our clinic review tool, if applicable. No additional management support is needed unless otherwise documented below in the visit note. 

## 2016-03-08 ENCOUNTER — Ambulatory Visit (INDEPENDENT_AMBULATORY_CARE_PROVIDER_SITE_OTHER): Payer: Medicare Other | Admitting: *Deleted

## 2016-03-08 ENCOUNTER — Encounter (HOSPITAL_COMMUNITY): Payer: Self-pay

## 2016-03-08 ENCOUNTER — Other Ambulatory Visit (HOSPITAL_COMMUNITY): Payer: Medicare Other

## 2016-03-08 ENCOUNTER — Encounter: Payer: Self-pay | Admitting: *Deleted

## 2016-03-08 VITALS — BP 140/68 | HR 60 | Ht 68.0 in | Wt 181.0 lb

## 2016-03-08 DIAGNOSIS — Z0000 Encounter for general adult medical examination without abnormal findings: Secondary | ICD-10-CM | POA: Diagnosis not present

## 2016-03-08 NOTE — Patient Instructions (Addendum)
Follow up with Dr.Paz as scheduled 04/07/16. Consider Mammogram and Bone Density Screening.  Continue to eat heart healthy diet (full of fruits, vegetables, whole grains, lean protein, water--limit salt, fat, and sugar intake) and increase physical activity as tolerated.   Health Maintenance, Female Introduction Adopting a healthy lifestyle and getting preventive care can go a long way to promote health and wellness. Talk with your health care provider about what schedule of regular examinations is right for you. This is a good chance for you to check in with your provider about disease prevention and staying healthy. In between checkups, there are plenty of things you can do on your own. Experts have done a lot of research about which lifestyle changes and preventive measures are most likely to keep you healthy. Ask your health care provider for more information. Weight and diet Eat a healthy diet  Be sure to include plenty of vegetables, fruits, low-fat dairy products, and lean protein.  Do not eat a lot of foods high in solid fats, added sugars, or salt.  Get regular exercise. This is one of the most important things you can do for your health.  Most adults should exercise for at least 150 minutes each week. The exercise should increase your heart rate and make you sweat (moderate-intensity exercise).  Most adults should also do strengthening exercises at least twice a week. This is in addition to the moderate-intensity exercise. Maintain a healthy weight  Body mass index (BMI) is a measurement that can be used to identify possible weight problems. It estimates body fat based on height and weight. Your health care provider can help determine your BMI and help you achieve or maintain a healthy weight.  For females 98 years of age and older:  A BMI below 18.5 is considered underweight.  A BMI of 18.5 to 24.9 is normal.  A BMI of 25 to 29.9 is considered overweight.  A BMI of 30  and above is considered obese. Watch levels of cholesterol and blood lipids  You should start having your blood tested for lipids and cholesterol at 81 years of age, then have this test every 5 years.  You may need to have your cholesterol levels checked more often if:  Your lipid or cholesterol levels are high.  You are older than 81 years of age.  You are at high risk for heart disease. Cancer screening Lung Cancer  Lung cancer screening is recommended for adults 15-13 years old who are at high risk for lung cancer because of a history of smoking.  A yearly low-dose CT scan of the lungs is recommended for people who:  Currently smoke.  Have quit within the past 15 years.  Have at least a 30-pack-year history of smoking. A pack year is smoking an average of one pack of cigarettes a day for 1 year.  Yearly screening should continue until it has been 15 years since you quit.  Yearly screening should stop if you develop a health problem that would prevent you from having lung cancer treatment. Breast Cancer  Practice breast self-awareness. This means understanding how your breasts normally appear and feel.  It also means doing regular breast self-exams. Let your health care provider know about any changes, no matter how small.  If you are in your 20s or 30s, you should have a clinical breast exam (CBE) by a health care provider every 1-3 years as part of a regular health exam.  If you are 40 or older, have  a CBE every year. Also consider having a breast X-ray (mammogram) every year.  If you have a family history of breast cancer, talk to your health care provider about genetic screening.  If you are at high risk for breast cancer, talk to your health care provider about having an MRI and a mammogram every year.  Breast cancer gene (BRCA) assessment is recommended for women who have family members with BRCA-related cancers. BRCA-related cancers  include:  Breast.  Ovarian.  Tubal.  Peritoneal cancers.  Results of the assessment will determine the need for genetic counseling and BRCA1 and BRCA2 testing. Cervical Cancer  Your health care provider may recommend that you be screened regularly for cancer of the pelvic organs (ovaries, uterus, and vagina). This screening involves a pelvic examination, including checking for microscopic changes to the surface of your cervix (Pap test). You may be encouraged to have this screening done every 3 years, beginning at age 30.  For women ages 24-65, health care providers may recommend pelvic exams and Pap testing every 3 years, or they may recommend the Pap and pelvic exam, combined with testing for human papilloma virus (HPV), every 5 years. Some types of HPV increase your risk of cervical cancer. Testing for HPV may also be done on women of any age with unclear Pap test results.  Other health care providers may not recommend any screening for nonpregnant women who are considered low risk for pelvic cancer and who do not have symptoms. Ask your health care provider if a screening pelvic exam is right for you.  If you have had past treatment for cervical cancer or a condition that could lead to cancer, you need Pap tests and screening for cancer for at least 20 years after your treatment. If Pap tests have been discontinued, your risk factors (such as having a new sexual partner) need to be reassessed to determine if screening should resume. Some women have medical problems that increase the chance of getting cervical cancer. In these cases, your health care provider may recommend more frequent screening and Pap tests. Colorectal Cancer  This type of cancer can be detected and often prevented.  Routine colorectal cancer screening usually begins at 81 years of age and continues through 81 years of age.  Your health care provider may recommend screening at an earlier age if you have risk factors  for colon cancer.  Your health care provider may also recommend using home test kits to check for hidden blood in the stool.  A small camera at the end of a tube can be used to examine your colon directly (sigmoidoscopy or colonoscopy). This is done to check for the earliest forms of colorectal cancer.  Routine screening usually begins at age 31.  Direct examination of the colon should be repeated every 5-10 years through 81 years of age. However, you may need to be screened more often if early forms of precancerous polyps or small growths are found. Skin Cancer  Check your skin from head to toe regularly.  Tell your health care provider about any new moles or changes in moles, especially if there is a change in a mole's shape or color.  Also tell your health care provider if you have a mole that is larger than the size of a pencil eraser.  Always use sunscreen. Apply sunscreen liberally and repeatedly throughout the day.  Protect yourself by wearing long sleeves, pants, a wide-brimmed hat, and sunglasses whenever you are outside. Heart disease, diabetes, and  high blood pressure  High blood pressure causes heart disease and increases the risk of stroke. High blood pressure is more likely to develop in:  People who have blood pressure in the high end of the normal range (130-139/85-89 mm Hg).  People who are overweight or obese.  People who are African American.  If you are 71-69 years of age, have your blood pressure checked every 3-5 years. If you are 63 years of age or older, have your blood pressure checked every year. You should have your blood pressure measured twice-once when you are at a hospital or clinic, and once when you are not at a hospital or clinic. Record the average of the two measurements. To check your blood pressure when you are not at a hospital or clinic, you can use:  An automated blood pressure machine at a pharmacy.  A home blood pressure monitor.  If you  are between 38 years and 56 years old, ask your health care provider if you should take aspirin to prevent strokes.  Have regular diabetes screenings. This involves taking a blood sample to check your fasting blood sugar level.  If you are at a normal weight and have a low risk for diabetes, have this test once every three years after 81 years of age.  If you are overweight and have a high risk for diabetes, consider being tested at a younger age or more often. Preventing infection Hepatitis B  If you have a higher risk for hepatitis B, you should be screened for this virus. You are considered at high risk for hepatitis B if:  You were born in a country where hepatitis B is common. Ask your health care provider which countries are considered high risk.  Your parents were born in a high-risk country, and you have not been immunized against hepatitis B (hepatitis B vaccine).  You have HIV or AIDS.  You use needles to inject street drugs.  You live with someone who has hepatitis B.  You have had sex with someone who has hepatitis B.  You get hemodialysis treatment.  You take certain medicines for conditions, including cancer, organ transplantation, and autoimmune conditions. Hepatitis C  Blood testing is recommended for:  Everyone born from [birth year] through 1963.  Anyone with known risk factors for hepatitis C. Sexually transmitted infections (STIs)  You should be screened for sexually transmitted infections (STIs) including gonorrhea and chlamydia if:  You are sexually active and are younger than 81 years of age.  You are older than 81 years of age and your health care provider tells you that you are at risk for this type of infection.  Your sexual activity has changed since you were last screened and you are at an increased risk for chlamydia or gonorrhea. Ask your health care provider if you are at risk.  If you do not have HIV, but are at risk, it may be recommended that you  take a prescription medicine daily to prevent HIV infection. This is called pre-exposure prophylaxis (PrEP). You are considered at risk if:  You are sexually active and do not regularly use condoms or know the HIV status of your partner(s).  You take drugs by injection.  You are sexually active with a partner who has HIV. Talk with your health care provider about whether you are at high risk of being infected with HIV. If you choose to begin PrEP, you should first be tested for HIV. You should then be tested every 3  months for as long as you are taking PrEP. Pregnancy  If you are premenopausal and you may become pregnant, ask your health care provider about preconception counseling.  If you may become pregnant, take 400 to 800 micrograms (mcg) of folic acid every day.  If you want to prevent pregnancy, talk to your health care provider about birth control (contraception). Osteoporosis and menopause  Osteoporosis is a disease in which the bones lose minerals and strength with aging. This can result in serious bone fractures. Your risk for osteoporosis can be identified using a bone density scan.  If you are 91 years of age or older, or if you are at risk for osteoporosis and fractures, ask your health care provider if you should be screened.  Ask your health care provider whether you should take a calcium or vitamin D supplement to lower your risk for osteoporosis.  Menopause may have certain physical symptoms and risks.  Hormone replacement therapy may reduce some of these symptoms and risks. Talk to your health care provider about whether hormone replacement therapy is right for you. Follow these instructions at home:  Schedule regular health, dental, and eye exams.  Stay current with your immunizations.  Do not use any tobacco products including cigarettes, chewing tobacco, or electronic cigarettes.  If you are pregnant, do not drink alcohol.  If you are breastfeeding, limit  how much and how often you drink alcohol.  Limit alcohol intake to no more than 1 drink per day for nonpregnant women. One drink equals 12 ounces of beer, 5 ounces of wine, or 1 ounces of hard liquor.  Do not use street drugs.  Do not share needles.  Ask your health care provider for help if you need support or information about quitting drugs.  Tell your health care provider if you often feel depressed.  Tell your health care provider if you have ever been abused or do not feel safe at home. This information is not intended to replace advice given to you by your health care provider. Make sure you discuss any questions you have with your health care provider. Document Released: 08/23/2010 Document Revised: 07/16/2015 Document Reviewed: 11/11/2014  2017 Elsevier    Constipation, Adult Constipation is when a person:  Poops (has a bowel movement) fewer times in a week than normal.  Has a hard time pooping.  Has poop that is dry, hard, or bigger than normal. Follow these instructions at home: Eating and drinking  Eat foods that have a lot of fiber, such as:  Fresh fruits and vegetables.  Whole grains.  Beans.  Eat less of foods that are high in fat, low in fiber, or overly processed, such as:  [place] fries.  Hamburgers.  Cookies.  Candy.  Soda.  Drink enough fluid to keep your pee (urine) clear or pale yellow. General instructions  Exercise regularly or as told by your doctor.  Go to the restroom when you feel like you need to poop. Do not hold it in.  Take over-the-counter and prescription medicines only as told by your doctor. These include any fiber supplements.  Do pelvic floor retraining exercises, such as:  Doing deep breathing while relaxing your lower belly (abdomen).  Relaxing your pelvic floor while pooping.  Watch your condition for any changes.  Keep all follow-up visits as told by your doctor. This is important. Contact a doctor  if:  You have pain that gets worse.  You have a fever.  You have not pooped for  4 days.  You throw up (vomit).  You are not hungry.  You lose weight.  You are bleeding from the anus.  You have thin, pencil-like poop (stool). Get help right away if:  You have a fever, and your symptoms suddenly get worse.  You leak poop or have blood in your poop.  Your belly feels hard or bigger than normal (is bloated).  You have very bad belly pain.  You feel dizzy or you faint. This information is not intended to replace advice given to you by your health care provider. Make sure you discuss any questions you have with your health care provider. Document Released: 07/27/2007 Document Revised: 08/28/2015 Document Reviewed: 07/29/2015 Elsevier Interactive Patient Education  2017 [employer].

## 2016-03-10 ENCOUNTER — Other Ambulatory Visit: Payer: Medicare Other

## 2016-03-10 ENCOUNTER — Ambulatory Visit: Payer: Medicare Other | Admitting: Family

## 2016-03-15 ENCOUNTER — Other Ambulatory Visit: Payer: Medicare Other

## 2016-03-15 ENCOUNTER — Ambulatory Visit (HOSPITAL_BASED_OUTPATIENT_CLINIC_OR_DEPARTMENT_OTHER): Payer: Medicare Other | Admitting: Family

## 2016-03-15 ENCOUNTER — Encounter: Payer: Self-pay | Admitting: Family

## 2016-03-15 VITALS — BP 163/53 | HR 60 | Temp 98.0°F | Resp 18 | Wt 178.8 lb

## 2016-03-15 DIAGNOSIS — G629 Polyneuropathy, unspecified: Secondary | ICD-10-CM

## 2016-03-15 DIAGNOSIS — D509 Iron deficiency anemia, unspecified: Secondary | ICD-10-CM

## 2016-03-15 DIAGNOSIS — R609 Edema, unspecified: Secondary | ICD-10-CM

## 2016-03-15 DIAGNOSIS — N189 Chronic kidney disease, unspecified: Secondary | ICD-10-CM

## 2016-03-15 DIAGNOSIS — D631 Anemia in chronic kidney disease: Secondary | ICD-10-CM

## 2016-03-15 DIAGNOSIS — D500 Iron deficiency anemia secondary to blood loss (chronic): Secondary | ICD-10-CM

## 2016-03-15 LAB — CBC WITH DIFFERENTIAL (CANCER CENTER ONLY)
BASO#: 0 10*3/uL (ref 0.0–0.2)
BASO%: 0.4 % (ref 0.0–2.0)
EOS ABS: 0.4 10*3/uL (ref 0.0–0.5)
EOS%: 4.1 % (ref 0.0–7.0)
HEMATOCRIT: 31.8 % — AB (ref 34.8–46.6)
HEMOGLOBIN: 10.1 g/dL — AB (ref 11.6–15.9)
LYMPH#: 2.3 10*3/uL (ref 0.9–3.3)
LYMPH%: 23.2 % (ref 14.0–48.0)
MCH: 26.6 pg (ref 26.0–34.0)
MCHC: 31.8 g/dL — ABNORMAL LOW (ref 32.0–36.0)
MCV: 84 fL (ref 81–101)
MONO#: 0.9 10*3/uL (ref 0.1–0.9)
MONO%: 8.6 % (ref 0.0–13.0)
NEUT#: 6.4 10*3/uL (ref 1.5–6.5)
NEUT%: 63.7 % (ref 39.6–80.0)
PLATELETS: 257 10*3/uL (ref 145–400)
RBC: 3.79 10*6/uL (ref 3.70–5.32)
RDW: 15.7 % (ref 11.1–15.7)
WBC: 10 10*3/uL (ref 3.9–10.0)

## 2016-03-15 LAB — IRON AND TIBC
%SAT: 16 % — AB (ref 21–57)
IRON: 28 ug/dL — AB (ref 41–142)
TIBC: 175 ug/dL — ABNORMAL LOW (ref 236–444)
UIBC: 148 ug/dL (ref 120–384)

## 2016-03-15 LAB — FERRITIN: Ferritin: 344 ng/ml — ABNORMAL HIGH (ref 9–269)

## 2016-03-15 NOTE — Progress Notes (Signed)
Hematology and Oncology Follow Up Visit  [name] [name] [id number] 1932/10/02 83 y.o. 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 She is still feeling a bit fatigued. Her counts have improved since she received blood and IV iron last month.  Hgb is now 10.1 with an MCV of 84.  She denies having noticed any more blood in her stool. No episodes of bleeding, bruising or petechiae.  She is able to get up and walk some with her walker. Her children take turns staying with her in her home so she doesn't stay alone.  She still has chronic swelling in both lower extremities. Pedal pulses are +1. She is taking lasix to help with fluid retention.  She is seeing Dr. Sharol Given monthly for her foot ulcer which she states is healing.  She has neuropathy in her feet. Her blood sugars are still not well controlled.  She has maintained a good appetite and is staying well hydrated. Her weight is stable.  No fever, chills, n/v, cough, rash, dizziness, headache, chest pain, palpitations, abdominal pain, changes in bowel or bladder habits. No lymphadenopathy found on example.   Medications:  Allergies as of 03/15/2016   No Known Allergies     Medication List       Accurate as of 03/15/16 11:09 AM. Always use your most recent med list.          acetaminophen 500 MG tablet Commonly known as:  TYLENOL Take 1,000 mg by mouth every 8 (eight) hours as needed.   aspirin 325 MG tablet Take 325 mg by mouth daily.   atorvastatin 40 MG tablet Commonly known as:  LIPITOR Take 1 tablet (40 mg total) by mouth daily.   clopidogrel 75 MG tablet Commonly known as:  PLAVIX Take 1  tablet (75 mg total) by mouth daily.   docusate sodium 100 MG capsule Commonly known as:  COLACE Take 100 mg by mouth daily as needed for mild constipation.   furosemide 20 MG tablet Commonly known as:  LASIX Take 1 tablet (20 mg total) by mouth daily.   gabapentin 100 MG capsule Commonly known as:  NEURONTIN Take 3 capsules (300 mg total) by mouth at bedtime as needed.   glimepiride 1 MG tablet Commonly known as:  AMARYL Take 1 tablet (1 mg total) by mouth daily with breakfast.   glucose blood test strip Check blood sugars twice daily.   metoprolol 50 MG tablet Commonly known as:  LOPRESSOR Take 1 tablet (50 mg total) by mouth 2 (two) times daily.   mupirocin ointment 2 % Commonly known as:  BACTROBAN Apply 1 application topically 2 (two) times daily.   nitroGLYCERIN 0.4 MG SL tablet Commonly known as:  NITROSTAT Place 1 tablet (0.4 mg total) under the tongue every 5 (five) minutes as needed for chest pain. Contact your doctor and/or seek medial attention right away if chest pain       Allergies: No Known Allergies  Past Medical History, Surgical history, Social history, and Family History were reviewed and updated.  Review of Systems: All other 10 point review of systems is negative.   Physical Exam:  vitals were not taken for this visit.  Wt Readings from Last 3 Encounters:  03/08/16 181 lb (82.1 kg)  02/04/16 181 lb (82.1 kg)  01/27/16 181 lb (82.1 kg)    Ocular: Sclerae unicteric, pupils equal, round and reactive to light Ear-nose-throat: Oropharynx clear, dentition fair Lymphatic: No cervical supraclavicular or axillary adenopathy Lungs no rales or rhonchi, good excursion bilaterally Heart regular rate and rhythm, no murmur appreciated Abd soft, nontender, positive bowel sounds, no liver or spleen tip palpated on exam, no fluid wave  MSK no focal spinal tenderness, no joint edema Neuro: non-focal, well-oriented, appropriate affect Breasts:  Deferred  Lab Results  Component Value Date   WBC 10.0 03/15/2016   HGB 10.1 (L) 03/15/2016   HCT 31.8 (L) 03/15/2016   MCV 84 03/15/2016   PLT 257 03/15/2016   Lab Results  Component Value Date   FERRITIN 54 02/04/2016   IRON 17 (L) 02/04/2016   TIBC 263 02/04/2016   UIBC 245 02/04/2016   IRONPCTSAT 7 (L) 02/04/2016   Lab Results  Component Value Date   RETICCTPCT 1.1 11/25/2014   RBC 3.79 03/15/2016   RETICCTABS 35.6 11/25/2014   No results found for: KPAFRELGTCHN, LAMBDASER, KAPLAMBRATIO No results found for: IGGSERUM, IGA, IGMSERUM No results found for: TOTALPROTELP, ALBUMINELP, A1GS, A2GS, BETS, BETA2SER, GAMS, MSPIKE, SPEI   Chemistry      Component Value Date/Time   NA 129 (L) 01/27/2016 1516   K 5.0 01/27/2016 1516   CL 97 01/27/2016 1516   CO2 24 01/27/2016 1516   BUN 41 (H) 01/27/2016 1516   CREATININE 1.59 (H) 01/27/2016 1516   CREATININE 1.56 (H) 01/20/2016 1639      Component Value Date/Time   CALCIUM 9.0 01/27/2016 1516   ALKPHOS 138 (H) 02/19/2015 1248   AST 15 02/19/2015 1248   ALT 12 (L) 02/19/2015 1248   BILITOT 0.4 02/19/2015 1248     Impression and Plan: [name] is a very charming 83 yo white female with multifactorial anemia (iron deficiency/chronic renal insufficiency). She responded nicely to the blood and IV iron last month. She still has some mild fatigue but states that this is better.  Her counts continue to improve and Hgb is now up to 10.1 with an MCV of 84.  We will see what her iron studies show. We can bring her back in later this week for an infusion if needed.  We will plan to see her back in 6 weeks for repeat lab work and follow-up.  Both she and her daughter know to contact us with any questions or concerns. We can certainly see her sooner if need be.   Eliezer Bottom, NP 1/23/201811:09 AM

## 2016-03-16 LAB — RETICULOCYTES: RETICULOCYTE COUNT: 1.1 % (ref 0.6–2.6)

## 2016-03-17 ENCOUNTER — Telehealth: Payer: Self-pay | Admitting: *Deleted

## 2016-03-17 NOTE — Telephone Encounter (Signed)
-----   Message from Eliezer Bottom, NP sent at 03/16/2016  3:21 PM EST ----- Regarding: iron  Needs one dose of IV iron next week please. Thank you!  Sarah  ----- Message ----- From: Interface, Lab In Three Zero One Sent: 03/15/2016  10:58 AM To: Eliezer Bottom, NP

## 2016-03-17 NOTE — Telephone Encounter (Addendum)
After attempting to call patient's daughter multiple times, message left on voice mail to call and schedule IV iron infusion.    ----- Message from Eliezer Bottom, NP sent at 03/16/2016  3:21 PM EST ----- Regarding: iron  Needs one dose of IV iron next week please. Thank you!  Sarah  ----- Message ----- From: Interface, Lab In Three Zero One Sent: 03/15/2016  10:58 AM To: Eliezer Bottom, NP

## 2016-03-17 NOTE — Telephone Encounter (Signed)
duplicate

## 2016-03-24 ENCOUNTER — Ambulatory Visit (INDEPENDENT_AMBULATORY_CARE_PROVIDER_SITE_OTHER): Payer: Medicare Other | Admitting: Orthopedic Surgery

## 2016-03-29 ENCOUNTER — Ambulatory Visit (HOSPITAL_BASED_OUTPATIENT_CLINIC_OR_DEPARTMENT_OTHER): Payer: Medicare Other

## 2016-03-29 VITALS — BP 163/56 | HR 62 | Temp 98.4°F | Resp 18

## 2016-03-29 DIAGNOSIS — D500 Iron deficiency anemia secondary to blood loss (chronic): Secondary | ICD-10-CM | POA: Diagnosis present

## 2016-03-29 MED ORDER — SODIUM CHLORIDE 0.9 % IV SOLN
510.0000 mg | Freq: Once | INTRAVENOUS | Status: AC
  Administered 2016-03-29: 510 mg via INTRAVENOUS
  Filled 2016-03-29: qty 17

## 2016-03-29 MED ORDER — SODIUM CHLORIDE 0.9 % IV SOLN
Freq: Once | INTRAVENOUS | Status: AC
  Administered 2016-03-29: 13:00:00 via INTRAVENOUS

## 2016-03-29 NOTE — Patient Instructions (Signed)
Ferumoxytol injection What is this medicine? FERUMOXYTOL is an iron complex. Iron is used to make healthy red blood cells, which carry oxygen and nutrients throughout the body. This medicine is used to treat iron deficiency anemia in people with chronic kidney disease. COMMON BRAND NAME(S): Feraheme What should I tell my health care provider before I take this medicine? They need to know if you have any of these conditions: -anemia not caused by low iron levels -high levels of iron in the blood -magnetic resonance imaging (MRI) test scheduled -an unusual or allergic reaction to iron, other medicines, foods, dyes, or preservatives -pregnant or trying to get pregnant -breast-feeding How should I use this medicine? This medicine is for injection into a vein. It is given by a health care professional in a hospital or clinic setting. Talk to your pediatrician regarding the use of this medicine in children. Special care may be needed. What if I miss a dose? It is important not to miss your dose. Call your doctor or health care professional if you are unable to keep an appointment. What may interact with this medicine? This medicine may interact with the following medications: -other iron products What should I watch for while using this medicine? Visit your doctor or healthcare professional regularly. Tell your doctor or healthcare professional if your symptoms do not start to get better or if they get worse. You may need blood work done while you are taking this medicine. You may need to follow a special diet. Talk to your doctor. Foods that contain iron include: whole grains/cereals, dried fruits, beans, or peas, leafy green vegetables, and organ meats (liver, kidney). What side effects may I notice from receiving this medicine? Side effects that you should report to your doctor or health care professional as soon as possible: -allergic reactions like skin rash, itching or hives, swelling of the  face, lips, or tongue -breathing problems -changes in blood pressure -feeling faint or lightheaded, falls -fever or chills -flushing, sweating, or hot feelings -swelling of the ankles or feet Side effects that usually do not require medical attention (report to your doctor or health care professional if they continue or are bothersome): -diarrhea -headache -nausea, vomiting -stomach pain Where should I keep my medicine? This drug is given in a hospital or clinic and will not be stored at home.  2017 Elsevier/Gold Standard (2015-03-12 12:41:49)  

## 2016-03-31 ENCOUNTER — Encounter (INDEPENDENT_AMBULATORY_CARE_PROVIDER_SITE_OTHER): Payer: Self-pay | Admitting: Orthopedic Surgery

## 2016-03-31 ENCOUNTER — Ambulatory Visit (INDEPENDENT_AMBULATORY_CARE_PROVIDER_SITE_OTHER): Payer: Medicare Other | Admitting: Family

## 2016-03-31 VITALS — Ht 68.0 in | Wt 178.0 lb

## 2016-03-31 DIAGNOSIS — L97421 Non-pressure chronic ulcer of left heel and midfoot limited to breakdown of skin: Secondary | ICD-10-CM

## 2016-03-31 DIAGNOSIS — I87332 Chronic venous hypertension (idiopathic) with ulcer and inflammation of left lower extremity: Secondary | ICD-10-CM

## 2016-03-31 DIAGNOSIS — E11621 Type 2 diabetes mellitus with foot ulcer: Secondary | ICD-10-CM | POA: Diagnosis not present

## 2016-03-31 DIAGNOSIS — L97929 Non-pressure chronic ulcer of unspecified part of left lower leg with unspecified severity: Principal | ICD-10-CM

## 2016-03-31 NOTE — Progress Notes (Signed)
Office Visit Note   Patient: [name] [name]           Date of Birth: 11-27-1932           MRN: [id number] Visit Date: 03/31/2016              Requested by: Colon Branch, MD [address] [place], [place] [address] PCP: Kathlene November, MD  Chief Complaint  Patient presents with  . Right Leg - Follow-up  . Left Leg - Follow-up    HPI: Patient has bilateral lower extremity swelling. She has the left leg wrapped with an ace bandage . The medial side of the foot has a small ulcer with yellow tissue and spot amount of drainage. She also bumped her shin and there is a small skin tear there. Pamella Pert, RMA  The patient is an 83 year old woman who presents today for evaluation of left lower extremity ulceration and edema. She has been having her daughter do daily wound care. They're applying a mupirocin ointment dressing and wrapping with Ace wrap for compression on the left. They feel the ulcer is improved.    Assessment & Plan: Visit Diagnoses:  1. Idiopathic chronic venous hypertension of left lower extremity with ulcer and inflammation (HCC)   2. Diabetic ulcer of left midfoot associated with type 2 diabetes mellitus, limited to breakdown of skin ([place])     Plan: Continue with current wound care. Compression to the left lower extremity as tolerated. Follow up in office in 4 weeks.   Follow-Up Instructions: Return in about 4 weeks (around 04/28/2016).   Ortho Exam Physical Exam  Constitutional: Appears well-developed.  Head: Normocephalic.  Eyes: EOM are normal.  Neck: Normal range of motion.  Cardiovascular: Normal rate.   Pulmonary/Chest: Effort normal.  Neurological: Is alert.  Skin: Skin is warm.  Psychiatric: Has a normal mood and affect. Ambulatory in wheelchair today. The left foot continues to have ulceration to the midfoot, medially extending towards the plantar surface. This is flat and pink. Is nickel sized. Has 1 mm of depth. There is thin exudative  tissue in wound bed. No drainage. No surrounding cellulitis. Over tibial tuberosity there is ecchymosis and thin skin tear. This is 4 mm in diameter.   Imaging: No results found.  Orders:  No orders of the defined types were placed in this encounter.  No orders of the defined types were placed in this encounter.    Procedures: No procedures performed  Clinical Data: No additional findings.  Subjective: Review of Systems  Constitutional: Negative for chills and fever.  Skin: Positive for wound.    Objective: Vital Signs: Ht 5\' 8"  (1.727 m)   Wt 178 lb (80.7 kg)   BMI 27.06 kg/m   Specialty Comments:  No specialty comments available.  PMFS History: Patient Active Problem List   Diagnosis Date Noted  . Onychomycosis of toenail 03/03/2016  . Midfoot ulcer, left, limited to breakdown of skin ([place]) 01/13/2016  . Idiopathic chronic venous hypertension of left lower extremity with ulcer and inflammation ([place]) 01/13/2016  . PCP NOTES >>>>>>>>>>>>>>>>>>>>> 09/30/2015  . Annual physical exam 08/28/2013  . Elevated alkaline phosphatase level 12/19/2012  . Unspecified constipation 07/11/2012  . Diabetic foot ulcer- LEFT-s/p amputation first Ray 2014 Dr. Sharol Given 04/11/2012  . Coronary artery disease 09/05/2010  . GERD (gastroesophageal reflux disease) 07/01/2010  . Anemia, iron deficiency 11/06/2009  . CT, CHEST, ABNORMAL 06/10/2009  . CARDIOMYOPATHY, ISCHEMIC 04/14/2009  . Chronic  systolic heart failure ([place]) 04/14/2009  . AUTOMATIC IMPLANTABLE CARDIAC DEFIBRILLATOR SITU 04/14/2009  . DIVERTICULOSIS OF COLON 07/18/2008  . LIVER FUNCTION TESTS, ABNORMAL, HX OF 07/18/2008  . Insomnia 11/05/2007  . GAIT DISTURBANCE 06/07/2007  . CHOLELITHIASIS 03/09/2007  . Carotid artery disease ([place]) 02/12/2007  . DM II (diabetes mellitus, type II), controlled ([place]) 10/27/2006  . Dyslipidemia 10/27/2006  . HTN (hypertension) 10/27/2006   Past Medical History:  Diagnosis Date  . Abnormal  CT scan, chest    last 06-08-2009, see report   . Anemia    iron def   . Barrett esophagus    EGD 08/2009, next 2013  . CAD (coronary artery disease)    s/p stent R side 9/08, re stenosis 1/09, s/p angioplasty 1/09 and a stent on 12/09; re-angioplasty 5/11  . Carotid arterial disease (HCC)    s/p stent R side 9-08, re stenosis 1-09, s/p angioplasty 1-09 and a stent on 12-09, last  angiogram 06-24-2009  . CHF (congestive heart failure) ([facility])   . Collagen vascular disease ([place])   . Diabetes mellitus   . Diverticulitis 12/08  . GERD (gastroesophageal reflux disease)    occ  . Hyperlipidemia   . Hypertension   . ICD (implantable cardiac defibrillator) in place     12-08-- [employer]   . 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 Stroke [facility])     Family History  Problem Relation Age of Onset  . Breast cancer Mother 71  . Tuberculosis Father   . Colon cancer Neg Hx     Past Surgical History:  Procedure Laterality Date  . AMPUTATION Left 06/08/2012   Procedure: AMPUTATION RAY;  Surgeon: Newt Minion, MD;  Location: [facility];  Service: Orthopedics;  Laterality: Left;  Left Foot 1st Ray Amputation  . ARTERIAL BYPASS SURGRY     08/08/2000  LIMA to LAD, SVG to intermediate, SVG to OM2, SVG to distal RCA  . CARDIAC DEFIBRILLATOR PLACEMENT     [employer]   Social History   Occupational History  . n/a    Social History Main Topics  . Smoking status: Never Smoker  . Smokeless tobacco: Never Used     Comment: never used tobacco  . Alcohol use No  . Drug use: No  . Sexual activity: No

## 2016-04-07 ENCOUNTER — Encounter: Payer: Self-pay | Admitting: Internal Medicine

## 2016-04-07 ENCOUNTER — Ambulatory Visit (INDEPENDENT_AMBULATORY_CARE_PROVIDER_SITE_OTHER): Payer: Medicare Other | Admitting: Internal Medicine

## 2016-04-07 VITALS — BP 126/78 | HR 67 | Temp 97.7°F | Resp 14 | Ht 68.0 in | Wt 179.2 lb

## 2016-04-07 DIAGNOSIS — E118 Type 2 diabetes mellitus with unspecified complications: Secondary | ICD-10-CM | POA: Diagnosis not present

## 2016-04-07 DIAGNOSIS — E876 Hypokalemia: Secondary | ICD-10-CM

## 2016-04-07 DIAGNOSIS — Z780 Asymptomatic menopausal state: Secondary | ICD-10-CM

## 2016-04-07 DIAGNOSIS — I5022 Chronic systolic (congestive) heart failure: Secondary | ICD-10-CM

## 2016-04-07 DIAGNOSIS — E1142 Type 2 diabetes mellitus with diabetic polyneuropathy: Secondary | ICD-10-CM

## 2016-04-07 DIAGNOSIS — Z13820 Encounter for screening for osteoporosis: Secondary | ICD-10-CM

## 2016-04-07 DIAGNOSIS — K5900 Constipation, unspecified: Secondary | ICD-10-CM | POA: Diagnosis not present

## 2016-04-07 LAB — BASIC METABOLIC PANEL
BUN: 15 mg/dL (ref 6–23)
CHLORIDE: 99 meq/L (ref 96–112)
CO2: 29 mEq/L (ref 19–32)
CREATININE: 0.86 mg/dL (ref 0.40–1.20)
Calcium: 9.1 mg/dL (ref 8.4–10.5)
GFR: 67.24 mL/min (ref >60.00)
GLUCOSE: 233 mg/dL — AB (ref 70–99)
POTASSIUM: 3.2 meq/L — AB (ref 3.5–5.1)
Sodium: 134 mEq/L — ABNORMAL LOW (ref 135–145)

## 2016-04-07 LAB — HEMOGLOBIN A1C: Hgb A1c MFr Bld: 7.3 % — ABNORMAL HIGH (ref 4.6–6.5)

## 2016-04-07 LAB — TSH: TSH: 2.27 u[IU]/mL (ref 0.35–4.50)

## 2016-04-07 MED ORDER — CLONAZEPAM 1 MG PO TABS: 0.5000 mg | ORAL_TABLET | Freq: Every evening | ORAL | 1 refills | Status: DC | PRN

## 2016-04-07 NOTE — Progress Notes (Signed)
Subjective:    Patient ID: [name] [name], female    DOB: 05-Apr-1932, 83 y.o.   MRN: [id number]  DOS:  04/07/2016 Type of visit - description :  Routine check up, here with her daughter Interval history: Neuropathy: Tried gabapentin, had weird dreams, self DC. Insomnia: Still issue, previously low dose clonazepam helped before . Prescription? Constipation, is a chronic, on and off problem, lately has been an issue, usually she can go several days without a BM and she developed some mild abdominal pain. Using Colace and other OTCs.    Review of Systems Denies chest pain or difficulty breathing. Lower extremity edema at baseline No nausea, vomiting. No blood in the stools No dysphagia.   Past Medical History:  Diagnosis Date  . Abnormal CT scan, chest    last 06-08-2009, see report   . Anemia    iron def   . Barrett esophagus    EGD 08/2009, next 2013  . CAD (coronary artery disease)    s/p stent R side 9/08, re stenosis 1/09, s/p angioplasty 1/09 and a stent on 12/09; re-angioplasty 5/11  . Carotid arterial disease (HCC)    s/p stent R side 9-08, re stenosis 1-09, s/p angioplasty 1-09 and a stent on 12-09, last  angiogram 06-24-2009  . CHF (congestive heart failure) ([place])   . Collagen vascular disease ([place])   . Diabetes mellitus   . Diverticulitis 12/08  . GERD (gastroesophageal reflux disease)    occ  . Hyperlipidemia   . Hypertension   . ICD (implantable cardiac defibrillator) in place     12-08-- [employer]   . 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 Stroke [facility])     Past Surgical History:  Procedure Laterality Date  . AMPUTATION Left 06/08/2012   Procedure: AMPUTATION RAY;  Surgeon: Newt Minion, MD;  Location: [facility];  Service: Orthopedics;  Laterality: Left;  Left Foot 1st Ray Amputation  . ARTERIAL BYPASS SURGRY     08/08/2000  LIMA to LAD, SVG to intermediate, SVG to OM2, SVG to distal RCA  . CARDIAC DEFIBRILLATOR PLACEMENT     [employer]    Social History     Social History  . Marital status: Widowed    Spouse name: N/A  . Number of children: 6  . Years of education: N/A   Occupational History  . n/a    Social History Main Topics  . Smoking status: Never Smoker  . Smokeless tobacco: Never Used     Comment: never used tobacco  . Alcohol use No  . Drug use: No  . Sexual activity: No   Other Topics Concern  . Not on file   Social History Narrative   Widow, a son lives w/ her , somebody stays with her every night   six children   [name] [name], daughter, usually comes w/ her    Doesn't drive anymore, limited  ADLs, uses a walker           Allergies as of 04/07/2016   No Known Allergies     Medication List       Accurate as of 04/07/16 11:59 PM. Always use your most recent med list.          acetaminophen 500 MG tablet Commonly known as:  TYLENOL Take 1,000 mg by mouth every 8 (eight) hours as needed.   aspirin 325 MG tablet Take 325 mg by mouth daily.   atorvastatin 40 MG tablet Commonly known as:  LIPITOR  Take 1 tablet (40 mg total) by mouth daily.   clonazePAM 1 MG tablet Commonly known as:  KLONOPIN Take 0.5 tablets (0.5 mg total) by mouth at bedtime as needed for anxiety.   clopidogrel 75 MG tablet Commonly known as:  PLAVIX Take 1 tablet (75 mg total) by mouth daily.   docusate sodium 100 MG capsule Commonly known as:  COLACE Take 100 mg by mouth daily as needed for mild constipation.   furosemide 20 MG tablet Commonly known as:  LASIX Take 1 tablet (20 mg total) by mouth daily.   glimepiride 1 MG tablet Commonly known as:  AMARYL Take 1 tablet (1 mg total) by mouth daily with breakfast.   glucose blood test strip Check blood sugars twice daily.   metoprolol 50 MG tablet Commonly known as:  LOPRESSOR Take 1 tablet (50 mg total) by mouth 2 (two) times daily.   mupirocin ointment 2 % Commonly known as:  BACTROBAN Apply 1 application topically 2 (two) times daily.   nitroGLYCERIN 0.4 MG SL  tablet Commonly known as:  NITROSTAT Place 1 tablet (0.4 mg total) under the tongue every 5 (five) minutes as needed for chest pain. Contact your doctor and/or seek medial attention right away if chest pain          Objective:   Physical Exam BP 126/78 (BP Location: Right Arm, Patient Position: Sitting, Cuff Size: Small)   Pulse 67   Temp 97.7 F (36.5 C) (Oral)   Resp 14   Ht 5' 8" (1.727 m)   Wt 179 lb 4 oz (81.3 kg)   SpO2 97%   BMI 27.25 kg/m  General:   Well developed, elderly lady sitting in a wheelchair, nondistressed  HEENT:  Normocephalic . Face symmetric, atraumatic Lungs:  CTA B Normal respiratory effort, no intercostal retractions, no accessory muscle use. Heart: RRR,  no murmur.  ++ soft pretibial edema bilaterally  Abdomen: Soft, nontender, not distended. Skin: Not pale. Not jaundice Neurologic:  alert & oriented X3.   Psych--  Cognition and judgment appear intact.  Cooperative with normal attention span and concentration.  Behavior appropriate. No anxious or depressed appearing    Assessment & Plan:   Assessment DM, + h/o amputations, neuropathy Neuropathy HTN High cholesterol LE edema since CABG CV: ---CAD, CABG 2002 ---CHF cardiomyopathy, single chamber ICD -2008  ---Carotid Artery Dz:  s/p stent R side 9-08, re stenosis 1-09, s/p angioplasty 1-09 and a stent on 12-09;  re-angioplasty  5-11 Abnormal CT chest: last CT 05-2012 "classic for rounded atelectasia" Anemia, sees hem-onc, last ov 11-2014, multifactorial  GI: --Increase LFTs-Alk phosphate:    [place] 10- 2014 Cholelithiasis. No findings to suggest acute cholecystitis.Dilated common duct, measuring 13 mm, chronic. No choledocholithiasis is seen. ---Barrett esophagus---EGD 08/2009, next was rec for 2013; declined EGD 03-2016, see note ---[place] [facility]  PLAN: DM: Currently on glimepiride, recheck a A1c Neuropathy: Tried  Gabapentin but  had weird dreams, self DC. At this point sx are okay.   retry gabapentin or rx Lyrica in the future if needed CHF: Edema at baseline, on Lasix 20 mg daily, not on KCl. Check a BMP Insomnia: Request a medication, previously clonazepam helped, Rx sent. Watch for excessive drowsiness, aware of increased risk of falls. H/o Barrett's esophagus, not on PPIs, asx , discussed further eval like repeat EGD but pt does not like to proceed d/t multiple med issues (I agree)  Chronic on and off constipation: Recommend a low dose of MiraLAX, 10 mg  daily. Colace  or a glycerin suppository as needed Primary care, never had a DEXA, willing to proceed RTC 3-4 months

## 2016-04-07 NOTE — Patient Instructions (Addendum)
GO TO THE LAB : Get the blood work     GO TO THE FRONT DESK Schedule your next appointment for a  routine check up in 3-4 months   For constipation: MiraLAX 10 g daily with fluids If mild constipation, try glycerin suppository Okay to take Colace as needed Call if severe symptoms  For insomnia: Half clonazepam as needed. Watch for excessive drowsiness

## 2016-04-07 NOTE — Progress Notes (Signed)
Pre visit review using our clinic review tool, if applicable. No additional management support is needed unless otherwise documented below in the visit note. 

## 2016-04-08 NOTE — Assessment & Plan Note (Signed)
PLAN: DM: Currently on glimepiride, recheck a A1c Neuropathy: Tried  Gabapentin but  had weird dreams, self DC. At this point sx are okay.  retry gabapentin or rx Lyrica in the future if needed CHF: Edema at baseline, on Lasix 20 mg daily, not on KCl. Check a BMP Insomnia: Request a medication, previously clonazepam helped, Rx sent. Watch for excessive drowsiness, aware of increased risk of falls. H/o Barrett's esophagus, not on PPIs, asx , discussed further eval like repeat EGD but pt does not like to proceed d/t multiple med issues (I agree)  Chronic on and off constipation: Recommend a low dose of MiraLAX, 10 mg daily. Colace  or a glycerin suppository as needed Primary care, never had a DEXA, willing to proceed RTC 3-4 months

## 2016-04-09 ENCOUNTER — Other Ambulatory Visit: Payer: Self-pay | Admitting: Internal Medicine

## 2016-04-09 MED ORDER — POTASSIUM CHLORIDE CRYS ER 10 MEQ PO TBCR: 10.0000 meq | EXTENDED_RELEASE_TABLET | Freq: Every day | ORAL | 0 refills | Status: DC

## 2016-04-13 ENCOUNTER — Ambulatory Visit (INDEPENDENT_AMBULATORY_CARE_PROVIDER_SITE_OTHER): Payer: Medicare Other | Admitting: *Deleted

## 2016-04-13 DIAGNOSIS — I255 Ischemic cardiomyopathy: Secondary | ICD-10-CM

## 2016-04-13 LAB — CUP PACEART INCLINIC DEVICE CHECK
Battery Voltage: 2.47 V
HighPow Impedance: 43.4659
Implantable Pulse Generator Implant Date: 20081211
Lead Channel Sensing Intrinsic Amplitude: 9.1 mV
Lead Channel Setting Pacing Amplitude: 2.5 V
Lead Channel Setting Pacing Pulse Width: 0.5 ms
MDC IDC LEAD IMPLANT DT: 20081211
MDC IDC LEAD LOCATION: 753860
MDC IDC MSMT LEADCHNL RV IMPEDANCE VALUE: 387.5 Ohm
MDC IDC SESS DTM: 20180221134645
MDC IDC SET LEADCHNL RV SENSING SENSITIVITY: 0.3 mV
MDC IDC STAT BRADY RV PERCENT PACED: 0.12 %
Pulse Gen Serial Number: 511432

## 2016-04-13 NOTE — Progress Notes (Signed)
Device check in clinic for battery estimate (N/C). Estimated longevity <3 months. No episodes recorded. Vibration demonstrated for patient. Patient will follow up with the Device clinic in 2 months (billable).

## 2016-04-14 MED ORDER — POTASSIUM CHLORIDE ER 10 MEQ PO TBCR: 10.0000 meq | EXTENDED_RELEASE_TABLET | Freq: Every day | ORAL | 0 refills | Status: DC

## 2016-04-14 NOTE — Addendum Note (Signed)
Addended byDamita Dunnings D on: 04/14/2016 09:05 AM   Modules accepted: Orders

## 2016-04-19 ENCOUNTER — Other Ambulatory Visit: Payer: Self-pay | Admitting: Internal Medicine

## 2016-04-26 ENCOUNTER — Ambulatory Visit (HOSPITAL_BASED_OUTPATIENT_CLINIC_OR_DEPARTMENT_OTHER): Payer: Medicare Other | Admitting: Family

## 2016-04-26 ENCOUNTER — Other Ambulatory Visit (HOSPITAL_BASED_OUTPATIENT_CLINIC_OR_DEPARTMENT_OTHER): Payer: Medicare Other

## 2016-04-26 VITALS — BP 134/42 | HR 64 | Temp 98.2°F

## 2016-04-26 DIAGNOSIS — D631 Anemia in chronic kidney disease: Secondary | ICD-10-CM | POA: Diagnosis not present

## 2016-04-26 DIAGNOSIS — N189 Chronic kidney disease, unspecified: Secondary | ICD-10-CM

## 2016-04-26 DIAGNOSIS — E119 Type 2 diabetes mellitus without complications: Secondary | ICD-10-CM

## 2016-04-26 DIAGNOSIS — D500 Iron deficiency anemia secondary to blood loss (chronic): Secondary | ICD-10-CM

## 2016-04-26 DIAGNOSIS — R609 Edema, unspecified: Secondary | ICD-10-CM

## 2016-04-26 DIAGNOSIS — D509 Iron deficiency anemia, unspecified: Secondary | ICD-10-CM | POA: Diagnosis not present

## 2016-04-26 LAB — CBC WITH DIFFERENTIAL (CANCER CENTER ONLY)
BASO#: 0.1 10*3/uL (ref 0.0–0.2)
BASO%: 0.5 % (ref 0.0–2.0)
EOS%: 1.9 % (ref 0.0–7.0)
Eosinophils Absolute: 0.2 10*3/uL (ref 0.0–0.5)
HCT: 29.6 % — ABNORMAL LOW (ref 34.8–46.6)
HGB: 9.4 g/dL — ABNORMAL LOW (ref 11.6–15.9)
LYMPH#: 1.7 10*3/uL (ref 0.9–3.3)
LYMPH%: 16 % (ref 14.0–48.0)
MCH: 26.6 pg (ref 26.0–34.0)
MCHC: 31.8 g/dL — ABNORMAL LOW (ref 32.0–36.0)
MCV: 84 fL (ref 81–101)
MONO#: 0.9 10*3/uL (ref 0.1–0.9)
MONO%: 8.6 % (ref 0.0–13.0)
NEUT#: 7.7 10*3/uL — ABNORMAL HIGH (ref 1.5–6.5)
NEUT%: 73 % (ref 39.6–80.0)
PLATELETS: 291 10*3/uL (ref 145–400)
RBC: 3.53 10*6/uL — AB (ref 3.70–5.32)
RDW: 16.2 % — AB (ref 11.1–15.7)
WBC: 10.5 10*3/uL — AB (ref 3.9–10.0)

## 2016-04-26 LAB — IRON AND TIBC
%SAT: 16 % — ABNORMAL LOW (ref 21–57)
Iron: 24 ug/dL — ABNORMAL LOW (ref 41–142)
TIBC: 148 ug/dL — AB (ref 236–444)
UIBC: 124 ug/dL (ref 120–384)

## 2016-04-26 LAB — FERRITIN: FERRITIN: 664 ng/mL — AB (ref 9–269)

## 2016-04-26 NOTE — Progress Notes (Signed)
Hematology and Oncology Follow Up Visit  [name] [name] [id number] 11-01-1932 83 y.o. 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 She is doing well since receiving IV iron in February. Her energy is somewhat improved.  Hgb is 9.4 with an MCV of 84. Iron studies are pending. She denies having noticed any blood in her stool. No episodes of bleeding, bruising or petechiae.  She still has chronic swelling in both lower extremities. Pedal pulses are +1. She is taking lasix which helps with fluid retention.  She states that she tried Neurontin for a short time for her neuropathy but stopped due to it causing her to have nightmares. She states that since then she has had no neuropathy in her feet. She states that they have stopped hurting. She continues to see [name] [name] monthly for her foot ulcers. She states that she has cut out salt and sweets and that her blood sugars have been better controlled. With this she has also noticed that her ulcers are healing much better. Her daughter cleans and wraps her feet daily.  She is still living in her home and her children take turns staying the night with her so she is not alone.  No fever, chills, n/v, cough, rash, dizziness, headache, chest pain, palpitations, abdominal pain, changes in bowel or bladder habits. She has constipation and occasional abdominal discomfort with this. She takes Dulcolax and Miralax as needed which does help.  No lymphadenopathy found on example. She is able to ambulate some with a walker. No recent falls or syncopal episodes.  She has maintained a good appetite and is staying well  hydrated. Her weight is stable.   Medications:  Allergies as of 04/26/2016   No Known Allergies     Medication List       Accurate as of 04/26/16 10:31 AM. Always use your most recent med list.          acetaminophen 500 MG tablet Commonly known as:  TYLENOL Take 1,000 mg by mouth every 8 (eight) hours as needed.   aspirin 325 MG tablet Take 325 mg by mouth daily.   atorvastatin 40 MG tablet Commonly known as:  LIPITOR Take 1 tablet (40 mg total) by mouth daily.   clonazePAM 1 MG tablet Commonly known as:  KLONOPIN Take 0.5 tablets (0.5 mg total) by mouth at bedtime as needed for anxiety.   clopidogrel 75 MG tablet Commonly known as:  PLAVIX Take 1 tablet (75 mg total) by mouth daily.   docusate sodium 100 MG capsule Commonly known as:  COLACE Take 100 mg by mouth daily as needed for mild constipation.   furosemide 20 MG tablet Commonly known as:  LASIX Take 1 tablet (20 mg total) by mouth daily.   glimepiride 1 MG tablet Commonly known as:  AMARYL Take 1 tablet (1 mg total) by mouth daily with breakfast.   glucose blood test strip Check blood sugars twice daily.   metoprolol 50 MG tablet Commonly known as:  LOPRESSOR Take 1 tablet (50 mg total) by mouth 2 (two) times daily.   mupirocin ointment 2 % Commonly known as:  BACTROBAN Apply 1 application  topically 2 (two) times daily.   nitroGLYCERIN 0.4 MG SL tablet Commonly known as:  NITROSTAT Place 1 tablet (0.4 mg total) under the tongue every 5 (five) minutes as needed for chest pain. Contact your doctor and/or seek medial attention right away if chest pain   potassium chloride 10 MEQ tablet Commonly known as:  K-DUR,KLOR-CON Take 1 tablet (10 mEq total) by mouth daily.   potassium chloride 10 MEQ tablet Commonly known as:  KLOR-CON 10 Take 1 tablet (10 mEq total) by mouth daily.       Allergies: No Known Allergies  Past Medical History, Surgical history, Social history, and Family History were  reviewed and updated.  Review of Systems: All other 10 point review of systems is negative.   Physical Exam:  vitals were not taken for this visit.  Wt Readings from Last 3 Encounters:  04/07/16 179 lb 4 oz (81.3 kg)  03/31/16 178 lb (80.7 kg)  03/15/16 178 lb 12 oz (81.1 kg)    Ocular: Sclerae unicteric, pupils equal, round and reactive to light Ear-nose-throat: Oropharynx clear, dentition fair Lymphatic: No cervical supraclavicular or axillary adenopathy Lungs no rales or rhonchi, good excursion bilaterally Heart regular rate and rhythm, no murmur appreciated Abd soft, nontender, positive bowel sounds, no liver or spleen tip palpated on exam, no fluid wave  MSK no focal spinal tenderness, no joint edema Neuro: non-focal, well-oriented, appropriate affect Breasts: Deferred  Lab Results  Component Value Date   WBC 10.0 03/15/2016   HGB 10.1 (L) 03/15/2016   HCT 31.8 (L) 03/15/2016   MCV 84 03/15/2016   PLT 257 03/15/2016   Lab Results  Component Value Date   FERRITIN 344 (H) 03/15/2016   IRON 28 (L) 03/15/2016   TIBC 175 (L) 03/15/2016   UIBC 148 03/15/2016   IRONPCTSAT 16 (L) 03/15/2016   Lab Results  Component Value Date   RETICCTPCT 1.1 11/25/2014   RBC 3.79 03/15/2016   RETICCTABS 35.6 11/25/2014   No results found for: KPAFRELGTCHN, LAMBDASER, KAPLAMBRATIO No results found for: IGGSERUM, IGA, IGMSERUM No results found for: Ronnald Ramp, A1GS, A2GS, BETS, BETA2SER, GAMS, MSPIKE, SPEI   Chemistry      Component Value Date/Time   NA 134 (L) 04/07/2016 1207   K 3.2 (L) 04/07/2016 1207   CL 99 04/07/2016 1207   CO2 29 04/07/2016 1207   BUN 15 04/07/2016 1207   CREATININE 0.86 04/07/2016 1207   CREATININE 1.56 (H) 01/20/2016 1639      Component Value Date/Time   CALCIUM 9.1 04/07/2016 1207   ALKPHOS 138 (H) 02/19/2015 1248   AST 15 02/19/2015 1248   ALT 12 (L) 02/19/2015 1248   BILITOT 0.4 02/19/2015 1248     Impression and Plan: Ms.  [name] is a very charming 83 yo white female with multifactorial anemia (iron deficiency/chronic renal insufficiency). She continues to do well. Her hgb is down a bit today at 9.4. She is asymptomatic with this.  We will see what her iron studies show and bring her back in later this week for an infusion if needed.  We will go ahead and plan to see her back in 6 weeks for repeat lab work and follow-up.  Both she and her daughter know to contact us with any questions or concerns. We can certainly see her sooner if need be.   Eliezer Bottom, NP 3/6/201810:31 AM

## 2016-04-27 ENCOUNTER — Telehealth: Payer: Self-pay | Admitting: *Deleted

## 2016-04-27 LAB — RETICULOCYTES: Reticulocyte Count: 1.1 % (ref 0.6–2.6)

## 2016-04-27 NOTE — Telephone Encounter (Addendum)
PAtient aware of results. Appointment made  ----- Message from Eliezer Bottom, NP sent at 04/26/2016  3:21 PM EST ----- Regarding: Iron  She will need one dose of IV iron this week please. Thank you!  Sarah  ----- Message ----- From: Interface, Lab In Three Zero One Sent: 04/26/2016  10:46 AM To: Eliezer Bottom, NP

## 2016-04-28 ENCOUNTER — Ambulatory Visit (INDEPENDENT_AMBULATORY_CARE_PROVIDER_SITE_OTHER): Payer: Medicare Other | Admitting: Orthopedic Surgery

## 2016-05-04 ENCOUNTER — Ambulatory Visit (HOSPITAL_BASED_OUTPATIENT_CLINIC_OR_DEPARTMENT_OTHER): Payer: Medicare Other

## 2016-05-04 ENCOUNTER — Other Ambulatory Visit: Payer: Self-pay | Admitting: Family

## 2016-05-04 VITALS — BP 160/53 | HR 65 | Temp 97.9°F | Resp 18

## 2016-05-04 DIAGNOSIS — D509 Iron deficiency anemia, unspecified: Secondary | ICD-10-CM

## 2016-05-04 DIAGNOSIS — D500 Iron deficiency anemia secondary to blood loss (chronic): Secondary | ICD-10-CM

## 2016-05-04 MED ORDER — SODIUM CHLORIDE 0.9 % IV SOLN
Freq: Once | INTRAVENOUS | Status: AC
  Administered 2016-05-04: 14:00:00 via INTRAVENOUS

## 2016-05-04 MED ORDER — DARBEPOETIN ALFA 300 MCG/0.6ML IJ SOSY: 300.0000 ug | PREFILLED_SYRINGE | Freq: Once | INTRAMUSCULAR | Status: DC

## 2016-05-04 MED ORDER — SODIUM CHLORIDE 0.9 % IV SOLN
510.0000 mg | Freq: Once | INTRAVENOUS | Status: AC
  Administered 2016-05-04: 510 mg via INTRAVENOUS
  Filled 2016-05-04: qty 17

## 2016-05-04 NOTE — Patient Instructions (Signed)
Ferumoxytol injection What is this medicine? FERUMOXYTOL is an iron complex. Iron is used to make healthy red blood cells, which carry oxygen and nutrients throughout the body. This medicine is used to treat iron deficiency anemia in people with chronic kidney disease. COMMON BRAND NAME(S): Feraheme What should I tell my health care provider before I take this medicine? They need to know if you have any of these conditions: -anemia not caused by low iron levels -high levels of iron in the blood -magnetic resonance imaging (MRI) test scheduled -an unusual or allergic reaction to iron, other medicines, foods, dyes, or preservatives -pregnant or trying to get pregnant -breast-feeding How should I use this medicine? This medicine is for injection into a vein. It is given by a health care professional in a hospital or clinic setting. Talk to your pediatrician regarding the use of this medicine in children. Special care may be needed. What if I miss a dose? It is important not to miss your dose. Call your doctor or health care professional if you are unable to keep an appointment. What may interact with this medicine? This medicine may interact with the following medications: -other iron products What should I watch for while using this medicine? Visit your doctor or healthcare professional regularly. Tell your doctor or healthcare professional if your symptoms do not start to get better or if they get worse. You may need blood work done while you are taking this medicine. You may need to follow a special diet. Talk to your doctor. Foods that contain iron include: whole grains/cereals, dried fruits, beans, or peas, leafy green vegetables, and organ meats (liver, kidney). What side effects may I notice from receiving this medicine? Side effects that you should report to your doctor or health care professional as soon as possible: -allergic reactions like skin rash, itching or hives, swelling of the  face, lips, or tongue -breathing problems -changes in blood pressure -feeling faint or lightheaded, falls -fever or chills -flushing, sweating, or hot feelings -swelling of the ankles or feet Side effects that usually do not require medical attention (report to your doctor or health care professional if they continue or are bothersome): -diarrhea -headache -nausea, vomiting -stomach pain Where should I keep my medicine? This drug is given in a hospital or clinic and will not be stored at home.  2017 Elsevier/Gold Standard (2015-03-12 12:41:49)  

## 2016-05-09 ENCOUNTER — Ambulatory Visit (INDEPENDENT_AMBULATORY_CARE_PROVIDER_SITE_OTHER): Payer: Medicare Other | Admitting: Orthopedic Surgery

## 2016-05-11 ENCOUNTER — Other Ambulatory Visit: Payer: Self-pay | Admitting: Internal Medicine

## 2016-05-12 ENCOUNTER — Encounter (INDEPENDENT_AMBULATORY_CARE_PROVIDER_SITE_OTHER): Payer: Self-pay | Admitting: Family

## 2016-05-12 ENCOUNTER — Encounter (INDEPENDENT_AMBULATORY_CARE_PROVIDER_SITE_OTHER): Payer: Self-pay | Admitting: Orthopedic Surgery

## 2016-05-12 ENCOUNTER — Ambulatory Visit (INDEPENDENT_AMBULATORY_CARE_PROVIDER_SITE_OTHER): Payer: Medicare Other | Admitting: Orthopedic Surgery

## 2016-05-12 DIAGNOSIS — L97421 Non-pressure chronic ulcer of left heel and midfoot limited to breakdown of skin: Secondary | ICD-10-CM | POA: Diagnosis not present

## 2016-05-12 DIAGNOSIS — B351 Tinea unguium: Secondary | ICD-10-CM

## 2016-05-12 DIAGNOSIS — E11621 Type 2 diabetes mellitus with foot ulcer: Secondary | ICD-10-CM

## 2016-05-12 DIAGNOSIS — I87332 Chronic venous hypertension (idiopathic) with ulcer and inflammation of left lower extremity: Secondary | ICD-10-CM | POA: Diagnosis not present

## 2016-05-12 DIAGNOSIS — I255 Ischemic cardiomyopathy: Secondary | ICD-10-CM

## 2016-05-12 DIAGNOSIS — L97929 Non-pressure chronic ulcer of unspecified part of left lower leg with unspecified severity: Principal | ICD-10-CM

## 2016-05-12 NOTE — Progress Notes (Signed)
Office Visit Note   Patient: [name] [name]           Date of Birth: December 21, 1932           MRN: [id number] Visit Date: 05/12/2016              Requested by: Colon Branch, MD [address] [place], [place] [address] PCP: Kathlene November, MD  Chief Complaint  Patient presents with  . Right Leg - Follow-up  . Left Leg - Follow-up    HPI: The patient is a 83 year old woman who presents today in follow-up for lymphedema and ulceration to the mid foot on the left. Also resents for nail trim bilateral lower extremities. Pleased that the anterior ankle ulceration has healed. They do wear compression stockings on the right most days is not today. Daughter wraps the left lower extremity with an Ace wrap for compression. They're doing Bactroban dressing changes daily.  Assessment & Plan: Visit Diagnoses:  1. Idiopathic chronic venous hypertension of left lower extremity with ulcer and inflammation (HCC)   2. 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 Continue with daily wound care and mupirocin dressings. Recommended compression to bilateral lower extremities daily.  Follow-Up Instructions: Return in about 3 months (around 08/12/2016).   Ortho Exam  Physical Exam  Constitutional: Appears well-developed.  Head: Normocephalic.  Eyes: EOM are normal.  Neck: Normal range of motion.  Cardiovascular: Normal rate.   Pulmonary/Chest: Effort normal.  Neurological: Is alert.  Skin: Skin is warm.  Psychiatric: Has a normal mood and affect. Bilateral lower extremities with massive edema. The left is better than the right. There is good wrinkling of the skin and Ace wrap for compression. Does have ulceration over the medial aspect of the left foot this is 25 mm x 10 mm. Relation tissue in the wound bed. There is no exudative tissue. Does have some surrounding erythema no  cellulitis odor no drainage. Unable to safely trim her own nails. Does have thickened and discolored onychomycotic nails these were trimmed today without incident Imaging: No results found.  Labs: Lab Results  Component Value Date   HGBA1C 7.3 (H) 04/07/2016   HGBA1C 7.0 (H) 09/30/2015   HGBA1C 6.3 10/07/2014   REPTSTATUS 12/20/2012 FINAL 12/18/2012   CULT  12/18/2012    Multiple bacterial morphotypes present, none predominant. Suggest appropriate recollection if clinically indicated. Performed at [facility] 12/04/2012    Orders:  No orders of the defined types were placed in this encounter.  No orders of the defined types were placed in this encounter.    Procedures: No procedures performed  Clinical Data: No additional findings.  ROS: Review of Systems  Constitutional: Negative for chills and fever.  Cardiovascular: Positive for leg swelling.  Skin: Positive for wound. Negative for color change and rash.    Objective: Vital Signs: There were no vitals taken for this visit.  Specialty Comments:  No specialty comments available.  PMFS History: Patient Active Problem List   Diagnosis Date Noted  . Onychomycosis of toenail 03/03/2016  . Midfoot ulcer, left, limited to breakdown of skin ([place]) 01/13/2016  . Idiopathic chronic venous hypertension of left lower extremity with ulcer and inflammation ([place]) 01/13/2016  . PCP NOTES >>>>>>>>>>>>>>>>>>>>> 09/30/2015  . Annual physical exam 08/28/2013  . Elevated alkaline  phosphatase level 12/19/2012  . Constipation 07/11/2012  . Diabetic foot ulcer- LEFT-s/p amputation first Ray 2014 Dr. Sharol Given 04/11/2012  . Coronary artery disease 09/05/2010  . GERD (gastroesophageal reflux disease) 07/01/2010  . Anemia, iron deficiency 11/06/2009  . CT, CHEST, ABNORMAL 06/10/2009  . CARDIOMYOPATHY, ISCHEMIC 04/14/2009  . Chronic systolic heart failure ([place]) 04/14/2009  . AUTOMATIC IMPLANTABLE CARDIAC  DEFIBRILLATOR SITU 04/14/2009  . DIVERTICULOSIS OF COLON 07/18/2008  . LIVER FUNCTION TESTS, ABNORMAL, HX OF 07/18/2008  . Insomnia 11/05/2007  . GAIT DISTURBANCE 06/07/2007  . CHOLELITHIASIS 03/09/2007  . Carotid artery disease ([place]) 02/12/2007  . Diabetic polyneuropathy ([place]) 10/27/2006  . Dyslipidemia 10/27/2006  . HTN (hypertension) 10/27/2006   Past Medical History:  Diagnosis Date  . Abnormal CT scan, chest    last 06-08-2009, see report   . Anemia    iron def   . Barrett esophagus    EGD 08/2009, next 2013  . CAD (coronary artery disease)    s/p stent R side 9/08, re stenosis 1/09, s/p angioplasty 1/09 and a stent on 12/09; re-angioplasty 5/11  . Carotid arterial disease (HCC)    s/p stent R side 9-08, re stenosis 1-09, s/p angioplasty 1-09 and a stent on 12-09, last  angiogram 06-24-2009  . CHF (congestive heart failure) ([place])   . Collagen vascular disease ([place])   . Diabetes mellitus   . Diverticulitis 12/08  . GERD (gastroesophageal reflux disease)    occ  . Hyperlipidemia   . Hypertension   . ICD (implantable cardiac defibrillator) in place     12-08-- [employer]   . 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 Stroke [facility])     Family History  Problem Relation Age of Onset  . Breast cancer Mother 51  . Tuberculosis Father   . Colon cancer Neg Hx     Past Surgical History:  Procedure Laterality Date  . AMPUTATION Left 06/08/2012   Procedure: AMPUTATION RAY;  Surgeon: Newt Minion, MD;  Location: [facility];  Service: Orthopedics;  Laterality: Left;  Left Foot 1st Ray Amputation  . ARTERIAL BYPASS SURGRY     08/08/2000  LIMA to LAD, SVG to intermediate, SVG to OM2, SVG to distal RCA  . CARDIAC DEFIBRILLATOR PLACEMENT     [employer]   Social History   Occupational History  . n/a    Social History Main Topics  . Smoking status: Never Smoker  . Smokeless tobacco: Never Used     Comment: never used tobacco  . Alcohol use No  . Drug use: No  . Sexual activity: No

## 2016-05-20 ENCOUNTER — Other Ambulatory Visit: Payer: Self-pay | Admitting: Internal Medicine

## 2016-05-23 ENCOUNTER — Ambulatory Visit: Payer: Medicare Other | Admitting: Internal Medicine

## 2016-05-25 ENCOUNTER — Ambulatory Visit: Payer: Medicare Other | Admitting: Internal Medicine

## 2016-05-31 ENCOUNTER — Ambulatory Visit (INDEPENDENT_AMBULATORY_CARE_PROVIDER_SITE_OTHER): Payer: Medicare Other | Admitting: Internal Medicine

## 2016-05-31 ENCOUNTER — Encounter: Payer: Self-pay | Admitting: Internal Medicine

## 2016-05-31 VITALS — BP 128/80 | HR 79 | Temp 98.2°F | Resp 14 | Ht 68.0 in | Wt 172.0 lb

## 2016-05-31 DIAGNOSIS — R1010 Upper abdominal pain, unspecified: Secondary | ICD-10-CM | POA: Diagnosis not present

## 2016-05-31 DIAGNOSIS — I10 Essential (primary) hypertension: Secondary | ICD-10-CM | POA: Diagnosis not present

## 2016-05-31 DIAGNOSIS — I255 Ischemic cardiomyopathy: Secondary | ICD-10-CM | POA: Diagnosis not present

## 2016-05-31 MED ORDER — PANTOPRAZOLE SODIUM 40 MG PO TBEC: 40.0000 mg | DELAYED_RELEASE_TABLET | Freq: Every day | ORAL | 3 refills | Status: DC

## 2016-05-31 NOTE — Progress Notes (Signed)
Pre visit review using our clinic review tool, if applicable. No additional management support is needed unless otherwise documented below in the visit note. 

## 2016-05-31 NOTE — Patient Instructions (Addendum)
GO TO THE LAB : Get the blood work     [employer] get a CT  Start pantoprazole   Call  or go to the ER if severe symptoms, fever, chills, blood in the stools, nausea vomiting.

## 2016-05-31 NOTE — Progress Notes (Signed)
Subjective:    Patient ID: [name] [name], female    DOB: 11-Apr-1932, 83 y.o.   MRN: [id number]  DOS:  05/31/2016 Type of visit - description : acute, gi sx  Interval history: Here with her daughter, states the symptoms started December 2017 but she did not mention to me before. Has a steady upper abdomen pain , R>L, steady, mild. Is not described as sharp and burning, "just hurts". When she eats,  discomfort is not worse but she definitely feels quite bloated even after a few bites. Has noted some weight loss in the last few weeks. Recently, potassium was low, on supplements.   Wt Readings from Last 3 Encounters:  05/31/16 172 lb (78 kg)  04/07/16 179 lb 4 oz (81.3 kg)  03/31/16 178 lb (80.7 kg)     Review of Systems  The patient has chronic constipation and for the last month sometimes has noted diarrhea (taking MiraLAX, I wonder if related to that medication) Occasionally has heartburn. Not on PPIs Occasional nausea but no vomiting, blood in the stools. No dysuria or gross hematuria  Past Medical History:  Diagnosis Date  . Abnormal CT scan, chest    last 06-08-2009, see report   . Anemia    iron def   . Barrett esophagus    EGD 08/2009, next 2013  . CAD (coronary artery disease)    s/p stent R side 9/08, re stenosis 1/09, s/p angioplasty 1/09 and a stent on 12/09; re-angioplasty 5/11  . Carotid arterial disease (HCC)    s/p stent R side 9-08, re stenosis 1-09, s/p angioplasty 1-09 and a stent on 12-09, last  angiogram 06-24-2009  . CHF (congestive heart failure) ([place])   . Collagen vascular disease ([place])   . Diabetes mellitus   . Diverticulitis 12/08  . GERD (gastroesophageal reflux disease)    occ  . Hyperlipidemia   . Hypertension   . ICD (implantable cardiac defibrillator) in place     12-08-- [employer]   . 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 Stroke [facility])     Past Surgical History:  Procedure Laterality Date  . AMPUTATION Left 06/08/2012   Procedure: AMPUTATION RAY;  Surgeon: Newt Minion, MD;  Location: [facility];  Service: Orthopedics;  Laterality: Left;  Left Foot 1st Ray Amputation  . ARTERIAL BYPASS SURGRY     08/08/2000  LIMA to LAD, SVG to intermediate, SVG to OM2, SVG to distal RCA  . CARDIAC DEFIBRILLATOR PLACEMENT     [employer]    Social History   Social History  . Marital status: Widowed    Spouse name: N/A  . Number of children: 6  . Years of education: N/A   Occupational History  . n/a    Social History Main Topics  . Smoking status: Never Smoker  . Smokeless tobacco: Never Used     Comment: never used tobacco  . Alcohol use No  . Drug use: No  . Sexual activity: No   Other Topics Concern  . Not on file   Social History Narrative   Widow, a son lives w/ her , somebody stays with her every night   six children   [name], daughter, usually comes w/ her    Doesn't drive anymore, limited  ADLs, uses a walker           Allergies as of 05/31/2016   No Known Allergies     Medication List       Accurate  as of 05/31/16 11:59 PM. Always use your most recent med list.          acetaminophen 500 MG tablet Commonly known as:  TYLENOL Take 1,000 mg by mouth every 8 (eight) hours as needed.   aspirin 325 MG tablet Take 325 mg by mouth daily.   atorvastatin 40 MG tablet Commonly known as:  LIPITOR Take 1 tablet (40 mg total) by mouth daily.   clonazePAM 1 MG tablet Commonly known as:  KLONOPIN Take 0.5 tablets (0.5 mg total) by mouth at bedtime as needed for anxiety.   clopidogrel 75 MG tablet Commonly known as:  PLAVIX Take 1 tablet (75 mg total) by mouth daily.   docusate sodium 100 MG capsule Commonly known as:  COLACE Take 100 mg by mouth daily as needed for mild constipation.   furosemide 20 MG tablet Commonly known as:  LASIX Take 1 tablet (20 mg total) by mouth daily.   glimepiride 1 MG tablet Commonly known as:  AMARYL Take 1 tablet (1 mg total) by mouth daily with breakfast.    glucose blood test strip Check blood sugars twice daily.   metoprolol 50 MG tablet Commonly known as:  LOPRESSOR Take 1 tablet (50 mg total) by mouth 2 (two) times daily.   mupirocin ointment 2 % Commonly known as:  BACTROBAN Apply 1 application topically 2 (two) times daily.   nitroGLYCERIN 0.4 MG SL tablet Commonly known as:  NITROSTAT Place 1 tablet (0.4 mg total) under the tongue every 5 (five) minutes as needed for chest pain. Contact your doctor and/or seek medial attention right away if chest pain   pantoprazole 40 MG tablet Commonly known as:  PROTONIX Take 1 tablet (40 mg total) by mouth daily.   potassium chloride 10 MEQ tablet Commonly known as:  K-DUR,KLOR-CON Take 1 tablet (10 mEq total) by mouth daily.          Objective:   Physical Exam  Abdominal:     BP 128/80 (BP Location: Left Arm, Patient Position: Sitting, Cuff Size: Small)   Pulse 79   Temp 98.2 F (36.8 C) (Oral)   Resp 14   Ht 5' 8"  (1.727 m)   Wt 172 lb (78 kg)   SpO2 98%   BMI 26.15 kg/m  General:   Well developed, elderly lady sitting in a wheelchair, nondistressed  HEENT:  Normocephalic . Face symmetric, atraumatic Lungs:  CTA B Normal respiratory effort, no intercostal retractions, no accessory muscle use. Heart: RRR,  no murmur.  ++ soft pretibial edema bilaterally  Abdomen: Soft, nontender, not distended. Good bowel sounds. She does have reducible umbilical hernia. See graphic Skin: Not pale. Not jaundice Neurologic:  alert & oriented X3.   Psych--  Cognition and judgment appear intact.  Cooperative with normal attention span and concentration.  Behavior appropriate. No anxious or depressed appearing       Assessment & Plan:    Assessment DM, + h/o amputations, neuropathy Neuropathy HTN High cholesterol LE edema since CABG CV: ---CAD, CABG 2002 ---CHF cardiomyopathy, single chamber ICD -2008  ---Carotid Artery Dz:  s/p stent R side 9-08, re stenosis 1-09, s/p  angioplasty 1-09 and a stent on 12-09;  re-angioplasty  5-11 Abnormal CT chest: last CT 05-2012 "classic for rounded atelectasia" Anemia, sees hem-onc, last ov 11-2014, multifactorial  GI: --Increase LFTs-Alk phosphate:    --[place] 10- 2014 Cholelithiasis. No findings to suggest acute cholecystitis.Dilated common duct, measuring 13 mm, chronic. No choledocholithiasis is seen. ---Barrett esophagus---EGD 08/2009 +  stricture,  gastritis mild, next was rec for 2013; declined EGD 03-2016, see note ---Porcelana GB  PLAN:  Upper abdominal discomfort: 83 year old lady with multiple medical problems including a history of porcelain gallbladder, increase LFTs and alkaline phosphatase, Barrett's esophagus, not on PPIs, never had a colonoscopy presents with upper abdominal discomfort, + documented weight loss, question of fullness on abdominal exam, needs further eval. Used to see Dr. Olevia Perches. She is retired. Plan: CT abdomen with and without if creatinine okay-GI referral, labs: CMP, CBC, amylase, lipase. Restart pantoprazole. HTN: Recent potassium low, reportedly taking supplements. Will check labs. Umbilical hernia: Warning symptoms discussed

## 2016-06-01 LAB — CBC WITH DIFFERENTIAL/PLATELET
BASOS PCT: 1.4 % (ref 0.0–3.0)
Basophils Absolute: 0.2 10*3/uL — ABNORMAL HIGH (ref 0.0–0.1)
EOS PCT: 0.9 % (ref 0.0–5.0)
Eosinophils Absolute: 0.1 10*3/uL (ref 0.0–0.7)
HEMATOCRIT: 31.9 % — AB (ref 36.0–46.0)
HEMOGLOBIN: 10.3 g/dL — AB (ref 12.0–15.0)
Lymphocytes Relative: 14.2 % (ref 12.0–46.0)
Lymphs Abs: 1.5 10*3/uL (ref 0.7–4.0)
MCHC: 32.2 g/dL (ref 30.0–36.0)
MCV: 81.8 fl (ref 78.0–100.0)
MONO ABS: 0.8 10*3/uL (ref 0.1–1.0)
Monocytes Relative: 7.9 % (ref 3.0–12.0)
Neutro Abs: 8 10*3/uL — ABNORMAL HIGH (ref 1.4–7.7)
Neutrophils Relative %: 75.6 % (ref 43.0–77.0)
Platelets: 343 10*3/uL (ref 150.0–400.0)
RBC: 3.91 Mil/uL (ref 3.87–5.11)
RDW: 17.4 % — AB (ref 11.5–15.5)
WBC: 10.6 10*3/uL — AB (ref 4.0–10.5)

## 2016-06-01 LAB — COMPREHENSIVE METABOLIC PANEL
ALBUMIN: 3.1 g/dL — AB (ref 3.5–5.2)
ALK PHOS: 316 U/L — AB (ref 39–117)
ALT: 9 U/L (ref 0–35)
AST: 22 U/L (ref 0–37)
BUN: 12 mg/dL (ref 6–23)
CALCIUM: 8.9 mg/dL (ref 8.4–10.5)
CHLORIDE: 94 meq/L — AB (ref 96–112)
CO2: 27 mEq/L (ref 19–32)
Creatinine, Ser: 0.95 mg/dL (ref 0.40–1.20)
GFR: 59.93 mL/min — AB (ref >60.00)
Glucose, Bld: 322 mg/dL — ABNORMAL HIGH (ref 70–99)
POTASSIUM: 3.6 meq/L (ref 3.5–5.1)
SODIUM: 129 meq/L — AB (ref 135–145)
TOTAL PROTEIN: 6.7 g/dL (ref 6.0–8.3)
Total Bilirubin: 0.4 mg/dL (ref 0.2–1.2)

## 2016-06-01 LAB — AMYLASE: AMYLASE: 30 U/L (ref 27–131)

## 2016-06-01 LAB — LIPASE: Lipase: 5 U/L — ABNORMAL LOW (ref 11.0–59.0)

## 2016-06-01 NOTE — Assessment & Plan Note (Signed)
Upper abdominal discomfort: 83 year old lady with multiple medical problems including a history of porcelain gallbladder, increase LFTs and alkaline phosphatase, Barrett's esophagus, not on PPIs, never had a colonoscopy presents with upper abdominal discomfort, + documented weight loss, question of fullness on abdominal exam, needs further eval. Used to see Dr. Olevia Perches. She is retired. Plan: CT abdomen with and without if creatinine okay-GI referral, labs: CMP, CBC, amylase, lipase. Restart pantoprazole. HTN: Recent potassium low, reportedly taking supplements. Will check labs. Umbilical hernia: Warning symptoms discussed

## 2016-06-02 ENCOUNTER — Ambulatory Visit (HOSPITAL_BASED_OUTPATIENT_CLINIC_OR_DEPARTMENT_OTHER)
Admission: RE | Admit: 2016-06-02 | Discharge: 2016-06-02 | Disposition: A | Discharge status: Home / Self Care | Payer: Medicare Other | Source: Ambulatory Visit | Attending: Internal Medicine | Admitting: Internal Medicine

## 2016-06-02 DIAGNOSIS — J90 Pleural effusion, not elsewhere classified: Secondary | ICD-10-CM | POA: Diagnosis not present

## 2016-06-02 DIAGNOSIS — K6389 Other specified diseases of intestine: Secondary | ICD-10-CM | POA: Diagnosis not present

## 2016-06-02 DIAGNOSIS — K8020 Calculus of gallbladder without cholecystitis without obstruction: Secondary | ICD-10-CM | POA: Insufficient documentation

## 2016-06-02 DIAGNOSIS — J9811 Atelectasis: Secondary | ICD-10-CM | POA: Insufficient documentation

## 2016-06-02 DIAGNOSIS — I700 Atherosclerosis of aorta: Secondary | ICD-10-CM | POA: Diagnosis not present

## 2016-06-02 DIAGNOSIS — C787 Secondary malignant neoplasm of liver and intrahepatic bile duct: Secondary | ICD-10-CM | POA: Diagnosis not present

## 2016-06-02 DIAGNOSIS — I708 Atherosclerosis of other arteries: Secondary | ICD-10-CM | POA: Insufficient documentation

## 2016-06-02 DIAGNOSIS — R1010 Upper abdominal pain, unspecified: Secondary | ICD-10-CM | POA: Diagnosis not present

## 2016-06-02 DIAGNOSIS — K439 Ventral hernia without obstruction or gangrene: Secondary | ICD-10-CM | POA: Insufficient documentation

## 2016-06-02 DIAGNOSIS — K838 Other specified diseases of biliary tract: Secondary | ICD-10-CM | POA: Insufficient documentation

## 2016-06-02 DIAGNOSIS — R590 Localized enlarged lymph nodes: Secondary | ICD-10-CM | POA: Insufficient documentation

## 2016-06-02 DIAGNOSIS — Z79899 Other long term (current) drug therapy: Secondary | ICD-10-CM | POA: Diagnosis not present

## 2016-06-02 DIAGNOSIS — R109 Unspecified abdominal pain: Secondary | ICD-10-CM | POA: Diagnosis not present

## 2016-06-02 MED ORDER — IOPAMIDOL (ISOVUE-300) INJECTION 61%
100.0000 mL | Freq: Once | INTRAVENOUS | Status: AC | PRN
  Administered 2016-06-02: 100 mL via INTRAVENOUS

## 2016-06-03 ENCOUNTER — Telehealth: Payer: Self-pay

## 2016-06-03 NOTE — Telephone Encounter (Signed)
Daughter Karna Christmas) called to get results of patients test. Plse adv. Plse call [phone]

## 2016-06-03 NOTE — Telephone Encounter (Signed)
Critical Please advise:  Mass in the transverse colon consisted with colon carcinoma metastatic foci throughout the liver.

## 2016-06-03 NOTE — Telephone Encounter (Signed)
Mass arising from the proximal transverse colon with extension into the surrounding mesenteric measuring 6.7 x 6.1 x 5.8 cm, consistent with colon carcinoma. There is nearby adenopathy. There are metastatic foci throughout the liver.  Porcelain gallbladder with cholelithiasis. Gallbladder wall not thickened. There is dilatation of the common bile duct with tapering distally. No biliary duct mass or calculus  Results in EPIC. Verbal given to PCP, will also forward note.

## 2016-06-03 NOTE — Telephone Encounter (Signed)
CT discuss with Terri,  the patient's son and Mrs Denette. Karna Christmas is aware that mass is likely malignant and metastatic. First step will be to see GI to see if they feel she is a good candidate for colonoscopy, she may not be due to multiple comorbidities and overall status. ER if symptoms of obstruction such as abdominal pain nausea vomiting. Also if blood in the stools. They know to call me anytime with questions.

## 2016-06-06 ENCOUNTER — Telehealth (HOSPITAL_COMMUNITY): Payer: Self-pay | Admitting: Cardiovascular Disease

## 2016-06-06 NOTE — Telephone Encounter (Signed)
Patient cancelled appt on 02/11/16 and 03/08/16. She will be removed from the workqueue.

## 2016-06-07 ENCOUNTER — Ambulatory Visit: Payer: Medicare Other | Admitting: Family

## 2016-06-07 ENCOUNTER — Other Ambulatory Visit: Payer: Medicare Other

## 2016-06-08 ENCOUNTER — Telehealth: Payer: Self-pay

## 2016-06-08 NOTE — Telephone Encounter (Signed)
UDS: 06/02/2016  Clonazepam: Not detected  Low risk per PCP 06/08/2016

## 2016-06-10 ENCOUNTER — Telehealth: Payer: Self-pay | Admitting: Internal Medicine

## 2016-06-10 NOTE — Telephone Encounter (Signed)
Please advise Dr. Bobby Rumpf, I'll be happy to talk to him but we need a DPR in place.

## 2016-06-10 NOTE — Telephone Encounter (Signed)
Not on DPR.  

## 2016-06-10 NOTE — Telephone Encounter (Signed)
Caller name: Dr Bobby Rumpf Relationship to patient:son Can be [place]:  Reason for call:would like to speak with Dr Larose Kells briefly

## 2016-06-10 NOTE — Telephone Encounter (Signed)
Called Dr. Bobby Rumpf back.  He explained that he was the patient's son-in-law and per the patient's request would like to be involved in her care after recent cancer diagnosis.  He understands that a DPR needs to be signed and has agreed to discuss the need for DPR with his wife, Karna Christmas ( patient's daughter), so that the daughter can bring patient by the office on Monday to fill out form.  He's concerned about how the discussion with GI doctor may go on Tuesday and would like to discuss his concerns with patient's providers whenever possible.

## 2016-06-13 NOTE — Telephone Encounter (Addendum)
I spoke with [name] [name], the main concern of the family is to keep [name] [name] happy and comfortable. The patient knows she has cancer and is optimistic about her treatment, the family would like for her not to know many details simply that she has cancer but not that is metastatic and very serious. They have the POA, they have a DPR. I think that is reasonable. We'll CC this note to P. Gunther, GI, she will see her in few days Will check and be sure that Dr. Alinda Sierras ( son in law) is in the [facility].

## 2016-06-14 ENCOUNTER — Ambulatory Visit: Payer: Medicare Other | Admitting: Nurse Practitioner

## 2016-06-14 ENCOUNTER — Other Ambulatory Visit: Payer: Self-pay | Admitting: Internal Medicine

## 2016-06-15 ENCOUNTER — Telehealth: Payer: Self-pay | Admitting: Internal Medicine

## 2016-06-15 DIAGNOSIS — C189 Malignant neoplasm of colon, unspecified: Secondary | ICD-10-CM

## 2016-06-15 NOTE — Telephone Encounter (Addendum)
  Spoke  with [place] PA (oncology)  about the patient's situation: It would be completely reasonable to refer her to oncology even without a tissue diagnosis for further evaluation. I spoke with Dr. Bobby Rumpf (intensivist in [place]) regards Mrs. [name] [name], GI evaluation is pending, we agreed to refer her to oncology even if she is seen before GI evaluation. All coordination will be made through [name] [name], the patient's daughter. The family has the power of attorney,  as stated in my previous note, they don't like the patient to know details of her condition ( metastatic cancer) etc. Plan: Please enter a oncology referral DX: Cancer. Coordinate that through [place], the patient's daughter.

## 2016-06-15 NOTE — Telephone Encounter (Signed)
Caller name: Dr Bobby Rumpf  Relation to pt: Doctor Call back number: [phone] Pharmacy:  Reason for call: Dr Bobby Rumpf was calling to speak with Dr Larose Kells related to pt situation. Dr Bobby Rumpf would like to be callled at tel mentioned above.

## 2016-06-16 NOTE — Telephone Encounter (Signed)
Referral placed.

## 2016-06-21 ENCOUNTER — Other Ambulatory Visit: Payer: Medicare Other

## 2016-06-21 ENCOUNTER — Ambulatory Visit (INDEPENDENT_AMBULATORY_CARE_PROVIDER_SITE_OTHER): Payer: Medicare Other | Admitting: Nurse Practitioner

## 2016-06-21 ENCOUNTER — Encounter: Payer: Self-pay | Admitting: Nurse Practitioner

## 2016-06-21 VITALS — BP 138/70 | HR 47

## 2016-06-21 DIAGNOSIS — R197 Diarrhea, unspecified: Secondary | ICD-10-CM | POA: Diagnosis not present

## 2016-06-21 DIAGNOSIS — K639 Disease of intestine, unspecified: Secondary | ICD-10-CM

## 2016-06-21 DIAGNOSIS — K6389 Other specified diseases of intestine: Secondary | ICD-10-CM

## 2016-06-21 MED ORDER — NA SULFATE-K SULFATE-MG SULF 17.5-3.13-1.6 GM/177ML PO SOLN: 1.0000 | Freq: Once | ORAL | 0 refills | Status: AC

## 2016-06-21 MED ORDER — HYDROCODONE-ACETAMINOPHEN 5-325 MG PO TABS: ORAL_TABLET | ORAL | 0 refills | Status: DC

## 2016-06-21 NOTE — Patient Instructions (Addendum)
If you are age 82 or older, your body mass index should be between 23-30. Your There is no height or weight on file to calculate BMI. If this is out of the aforementioned range listed, please consider follow up with your Primary Care Provider.  If you are age 82 or younger, your body mass index should be between 19-25. Your There is no height or weight on file to calculate BMI. If this is out of the aformentioned range listed, please consider follow up with your Primary Care Provider.   You have been scheduled for a colonoscopy. Please follow written instructions given to you at your visit today.  Please pick up your prep supplies at the pharmacy within the next 1-3 days. If you use inhalers (even only as needed), please bring them with you on the day of your procedure. Your physician has requested that you go to www.startemmi.com and enter the access code given to you at your visit today. This web site gives a general overview about your procedure. However, you should still follow specific instructions given to you by our office regarding your preparation for the procedure.  Your physician has requested that you go to the basement for the following lab work before leaving today: C Diff PCR  We have sent the following medications to your pharmacy for you to pick up at your convenience: Suprep Hydrocodone   Thank you for choosing me and [facility].   Tye Savoy, NP

## 2016-06-21 NOTE — Progress Notes (Signed)
HPI:  Patient is an 83 year old female with multiple medical problems not limited to CAD/CABG, CHF cardiomyopathy status post ICD, CVA with RICA stent, and Barrett's. She was previously followed by Dr. Olevia Perches. Patient is here with her son and daughter. She is referred by PCP, Dr. Kathlene November, for transverse colon mass with liver mets on CTscan which was done for weight loss .Patient typically has problems with constipation but two months ago developed diarrhea. No recent medication changes. Recent antibiotics?  Taking Imodium as needed. Stool non-bloody. She also has been having mid abdominal pain, especially after eating but also during the night. Last night was the worst pain she has had so far. Taking Aleve for the pain.  Family asked in advance that patient not know all the details of the cancer at this point   Past Medical History:  Diagnosis Date  . Abnormal CT scan, chest    last 06-08-2009, see report   . Anemia    iron def   . Barrett esophagus    EGD 08/2009, next 2013  . CAD (coronary artery disease)    s/p stent R side 9/08, re stenosis 1/09, s/p angioplasty 1/09 and a stent on 12/09; re-angioplasty 5/11  . Carotid arterial disease (HCC)    s/p stent R side 9-08, re stenosis 1-09, s/p angioplasty 1-09 and a stent on 12-09, last  angiogram 06-24-2009  . CHF (congestive heart failure) ([place])   . Collagen vascular disease ([place])   . Diabetes mellitus   . Diverticulitis 12/08  . GERD (gastroesophageal reflux disease)    occ  . Hyperlipidemia   . Hypertension   . ICD (implantable cardiac defibrillator) in place     12-08-- [employer]   . 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 Stroke [facility])      Past Surgical History:  Procedure Laterality Date  . AMPUTATION Left 06/08/2012   Procedure: AMPUTATION RAY;  Surgeon: Newt Minion, MD;  Location: [facility];  Service: Orthopedics;  Laterality: Left;  Left Foot 1st Ray Amputation  . ARTERIAL BYPASS SURGRY     08/08/2000  LIMA to  LAD, SVG to intermediate, SVG to OM2, SVG to distal RCA  . CARDIAC DEFIBRILLATOR PLACEMENT     st judes   Family History  Problem Relation Age of Onset  . Breast cancer Mother 12  . Tuberculosis Father   . Colon cancer Neg Hx    Social History  Substance Use Topics  . Smoking status: Never Smoker  . Smokeless tobacco: Never Used     Comment: never used tobacco  . Alcohol use No   Current Outpatient Prescriptions  Medication Sig Dispense Refill  . acetaminophen (TYLENOL) 500 MG tablet Take 1,000 mg by mouth every 8 (eight) hours as needed.    Marland Kitchen aspirin 325 MG tablet Take 325 mg by mouth daily.     Marland Kitchen atorvastatin (LIPITOR) 40 MG tablet Take 1 tablet (40 mg total) by mouth daily. 30 tablet 5  . clonazePAM (KLONOPIN) 1 MG tablet Take 0.5 tablets (0.5 mg total) by mouth at bedtime as needed for anxiety. 30 tablet 1  . clopidogrel (PLAVIX) 75 MG tablet Take 1 tablet (75 mg total) by mouth daily. 30 tablet 5  . docusate sodium (COLACE) 100 MG capsule Take 100 mg by mouth daily as needed for mild constipation.    . furosemide (LASIX) 20 MG tablet Take 1 tablet (20 mg total) by mouth daily. 30 tablet 6  .  glimepiride (AMARYL) 1 MG tablet Take 1 tablet (1 mg total) by mouth daily with breakfast. 30 tablet 5  . glucose blood test strip Check blood sugars twice daily. 100 each 3  . mupirocin ointment (BACTROBAN) 2 % Apply 1 application topically 2 (two) times daily. 22 g 6  . nitroGLYCERIN (NITROSTAT) 0.4 MG SL tablet Place 1 tablet (0.4 mg total) under the tongue every 5 (five) minutes as needed for chest pain. Contact your doctor and/or seek medial attention right away if chest pain 40 tablet 0  . pantoprazole (PROTONIX) 40 MG tablet Take 1 tablet (40 mg total) by mouth daily. 30 tablet 3  . potassium chloride (K-DUR,KLOR-CON) 10 MEQ tablet Take 1 tablet (10 mEq total) by mouth daily. 30 tablet 0  . metoprolol (LOPRESSOR) 50 MG tablet Take 1 tablet (50 mg total) by mouth 2 (two) times daily. 60  tablet 5   No current facility-administered medications for this visit.    No Known Allergies   Review of Systems: All systems reviewed and negative except where noted in HPI.    Physical Exam: BP 138/70   Pulse (!) 47   SpO2 97%  Constitutional:  Well-developed, white female in a wheelchair in no acute distress. Psychiatric: Normal mood and affect. Behavior is normal. EENT: Conjunctivae are normal. No scleral icterus. Neck supple.  Cardiovascular: Normal rate, regular rhythm.  Pulmonary/chest: Effort normal and breath sounds normal. No wheezing, rales or rhonchi. Abdominal: Soft, nondistended, bowel sounds active throughout. Exam otherwise limited as patient in wheelchair. Marland Kitchen Neurological: Alert and oriented to person place and time. Skin: Skin is warm and dry. No rashes noted.   ASSESSMENT AND PLAN:  1. Very pleasant 83 yo female here with son and daughter, referred by PCP. She has a transverse colon mass with liver lesions on CTscan.  No colonoscopy in many years. I don't think the son and daughter want patient to know yet that the cancer is likely metastatic. - PCP has already placed a referral to Oncology.  -Patient is agreeable to a colonoscopy for tissue sample. She is not ambulatory, procedure will need to be done at hospital. I will talk with Dr. Ardis Hughs about a date / time as he will be assuming her care since Dr. Olevia Perches has retired. Anticoagulant will need to be held and I will communicate this to Cardiology    2. Abdominal pain. Likely related to the colon cancer. She is taking NSAIDS. Terrible pain during the night. - I recommended tylenol in lieu of NSAIDS but gave her some hydrocodone for sever pain.  Either son or daughter are with patient at all times and they know it may make her drowsy and possibly lead to constipation.   3. Diarrhea. Could be related to the colon cancer.  -I didn't get the impression this was overflow diarrhea but it could be. Will check stool   studies.    Tye Savoy, NP  06/21/2016, 11:29 AM  Cc: Colon Branch, MD

## 2016-06-23 ENCOUNTER — Other Ambulatory Visit: Payer: Medicare Other

## 2016-06-23 ENCOUNTER — Telehealth: Payer: Self-pay

## 2016-06-23 ENCOUNTER — Other Ambulatory Visit: Payer: Self-pay

## 2016-06-23 DIAGNOSIS — K6389 Other specified diseases of intestine: Secondary | ICD-10-CM

## 2016-06-23 DIAGNOSIS — K639 Disease of intestine, unspecified: Secondary | ICD-10-CM | POA: Diagnosis not present

## 2016-06-23 DIAGNOSIS — R197 Diarrhea, unspecified: Secondary | ICD-10-CM

## 2016-06-23 MED ORDER — NA SULFATE-K SULFATE-MG SULF 17.5-3.13-1.6 GM/177ML PO SOLN: 1.0000 | Freq: Once | ORAL | 0 refills | Status: DC

## 2016-06-23 NOTE — Progress Notes (Signed)
I agree with the above note, plan.   Please put her on for colonoscopy next Thursday with MAC sedation at [facility].  Thanks  Will send to [place].

## 2016-06-23 NOTE — Telephone Encounter (Signed)
Milus Banister, MD at 06/21/2016 11:00 AM   Status: Signed    I agree with the above note, plan.   Please put her on for colonoscopy next Thursday with MAC sedation at [facility].  Thanks  Will send to [place].

## 2016-06-23 NOTE — Telephone Encounter (Signed)
Yes, hold for 5 days, usual workflow.  thanks

## 2016-06-23 NOTE — Telephone Encounter (Signed)
Colon scheduled, pt instructed and medications reviewed. Patient instructions mailed to home.  Patient to call with any questions or concerns. Waiting for response regarding plavix

## 2016-06-23 NOTE — Telephone Encounter (Signed)
Dr Ardis Hughs pt is on Plavix should she hold?

## 2016-06-23 NOTE — Addendum Note (Signed)
Addended by: Candie Mile on: 06/23/2016 03:11 PM   Modules accepted: Orders

## 2016-06-23 NOTE — Telephone Encounter (Signed)
-----   Message from Milus Banister, MD sent at 06/23/2016  7:09 AM EDT -----   ----- Message ----- From: Willia Craze, NP Sent: 06/22/2016   8:28 PM To: Milus Banister, MD

## 2016-06-24 LAB — CLOSTRIDIUM DIFFICILE BY PCR: Toxigenic C. Difficile by PCR: NOT DETECTED

## 2016-06-24 NOTE — Telephone Encounter (Signed)
Josue Hector, MD sent to Jeoffrey Massed, RN        Plavix is not for CAD ok to stop for procedure   Previous Messages    ----- Message -----  From: Jeoffrey Massed, RN  Sent: 06/23/2016  2:19 PM  To: Josue Hector, MD

## 2016-06-24 NOTE — Telephone Encounter (Signed)
Pt has been notified to hold plavix 5 days prior to procedure.  She will stop on 06/25/16 and will start back when Dr Ardis Hughs recommends

## 2016-06-27 ENCOUNTER — Ambulatory Visit (HOSPITAL_BASED_OUTPATIENT_CLINIC_OR_DEPARTMENT_OTHER): Payer: Medicare Other | Admitting: Hematology & Oncology

## 2016-06-27 ENCOUNTER — Other Ambulatory Visit (HOSPITAL_BASED_OUTPATIENT_CLINIC_OR_DEPARTMENT_OTHER): Payer: Medicare Other

## 2016-06-27 ENCOUNTER — Encounter: Payer: Self-pay | Admitting: Hematology & Oncology

## 2016-06-27 VITALS — BP 127/48 | HR 60 | Temp 98.0°F | Resp 16 | Wt 176.1 lb

## 2016-06-27 DIAGNOSIS — D500 Iron deficiency anemia secondary to blood loss (chronic): Secondary | ICD-10-CM

## 2016-06-27 DIAGNOSIS — C787 Secondary malignant neoplasm of liver and intrahepatic bile duct: Secondary | ICD-10-CM

## 2016-06-27 DIAGNOSIS — C184 Malignant neoplasm of transverse colon: Secondary | ICD-10-CM | POA: Diagnosis not present

## 2016-06-27 DIAGNOSIS — D509 Iron deficiency anemia, unspecified: Secondary | ICD-10-CM | POA: Diagnosis not present

## 2016-06-27 DIAGNOSIS — R599 Enlarged lymph nodes, unspecified: Secondary | ICD-10-CM | POA: Diagnosis not present

## 2016-06-27 DIAGNOSIS — R609 Edema, unspecified: Secondary | ICD-10-CM

## 2016-06-27 DIAGNOSIS — Z8673 Personal history of transient ischemic attack (TIA), and cerebral infarction without residual deficits: Secondary | ICD-10-CM | POA: Diagnosis not present

## 2016-06-27 DIAGNOSIS — C189 Malignant neoplasm of colon, unspecified: Secondary | ICD-10-CM

## 2016-06-27 HISTORY — DX: Malignant neoplasm of colon, unspecified: C78.7

## 2016-06-27 HISTORY — DX: Malignant neoplasm of colon, unspecified: C18.9

## 2016-06-27 LAB — CBC WITH DIFFERENTIAL (CANCER CENTER ONLY)
BASO#: 0 10*3/uL (ref 0.0–0.2)
BASO%: 0.2 % (ref 0.0–2.0)
EOS ABS: 0.1 10*3/uL (ref 0.0–0.5)
EOS%: 1.2 % (ref 0.0–7.0)
HCT: 30.8 % — ABNORMAL LOW (ref 34.8–46.6)
HGB: 9.9 g/dL — ABNORMAL LOW (ref 11.6–15.9)
LYMPH#: 1.3 10*3/uL (ref 0.9–3.3)
LYMPH%: 12.4 % — ABNORMAL LOW (ref 14.0–48.0)
MCH: 27 pg (ref 26.0–34.0)
MCHC: 32.1 g/dL (ref 32.0–36.0)
MCV: 84 fL (ref 81–101)
MONO#: 1.1 10*3/uL — AB (ref 0.1–0.9)
MONO%: 10.2 % (ref 0.0–13.0)
NEUT#: 8.2 10*3/uL — ABNORMAL HIGH (ref 1.5–6.5)
NEUT%: 76 % (ref 39.6–80.0)
Platelets: 360 10*3/uL (ref 145–400)
RBC: 3.67 10*6/uL — ABNORMAL LOW (ref 3.70–5.32)
RDW: 16.3 % — AB (ref 11.1–15.7)
WBC: 10.8 10*3/uL — ABNORMAL HIGH (ref 3.9–10.0)

## 2016-06-27 NOTE — Progress Notes (Signed)
Hematology and Oncology Follow Up Visit  [name] [name] [id number] [birth year] 83 y.o. 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 Shockingly, whose I she has metastatic colon cancer. She showed up with some abdominal pain. She has been seen by [name] [name]. She had a CT of the abdomen and pelvis was done. This showed multiple liver metastasis. She had a tumor in the transverse colon.  The tumor itself measured 6.7 x 6.1 x 5.8 cm. There is some slightly enlarged lymph nodes. There were no nodules in the lung had the bases.  She is to undergo a colonoscopy on Thursday.  She has no nausea or vomiting. She's not eating as much air and her weight is holding pretty steady.  Not sure she really knows how much is going on. Her daughter really does not want her to know how bad the situation is.  She comes in a wheelchair. She is doing not walking much. She has severe chronic edema of her legs.  Overall, I would have to say that her performance status is at best, ECOG 3.   Medications:  Allergies as of 06/27/2016   No Known Allergies     Medication List       Accurate as of 06/27/16  3:17 PM. Always use your most recent med list.          acetaminophen 500 MG tablet Commonly known as:  TYLENOL Take 1,000 mg by mouth every 8 (eight) hours as needed.   aspirin 325 MG tablet Take 325 mg by mouth daily.   atorvastatin 40 MG tablet Commonly known as:  LIPITOR Take 1 tablet (40 mg total) by mouth daily.   clonazePAM 1 MG tablet Commonly known as:  KLONOPIN Take 0.5 tablets  (0.5 mg total) by mouth at bedtime as needed for anxiety.   clopidogrel 75 MG tablet Commonly known as:  PLAVIX Take 1 tablet (75 mg total) by mouth daily.   docusate sodium 100 MG capsule Commonly known as:  COLACE Take 100 mg by mouth daily as needed for mild constipation.   furosemide 20 MG tablet Commonly known as:  LASIX Take 1 tablet (20 mg total) by mouth daily.   glimepiride 1 MG tablet Commonly known as:  AMARYL Take 1 tablet (1 mg total) by mouth daily with breakfast.   glucose blood test strip Check blood sugars twice daily.   HYDROcodone-acetaminophen 5-325 MG tablet Commonly known as:  NORCO/VICODIN 1/2 to 1 tablet every 8 hrs as needed for pain   metoprolol 50 MG tablet Commonly known as:  LOPRESSOR Take 1 tablet (50 mg total) by mouth 2 (two) times daily.   mupirocin ointment 2 % Commonly known as:  BACTROBAN Apply 1 application topically 2 (two) times daily.   nitroGLYCERIN 0.4 MG SL tablet Commonly known as:  NITROSTAT Place 1 tablet (0.4 mg total) under the tongue every 5 (five) minutes as needed for chest pain. Contact your doctor and/or seek medial attention right away if chest pain   pantoprazole 40 MG tablet Commonly known as:  PROTONIX Take 1 tablet (40 mg total) by mouth  daily.   potassium chloride 10 MEQ tablet Commonly known as:  K-DUR,KLOR-CON Take 1 tablet (10 mEq total) by mouth daily.       Allergies: No Known Allergies  Past Medical History, Surgical history, Social history, and Family History were reviewed and updated.  Review of Systems: All other 10 point review of systems is negative.   Physical Exam:  weight is 176 lb 1.9 oz (79.9 kg). Her oral temperature is 98 F (36.7 C). Her blood pressure is 127/48 (abnormal) and her pulse is 60. Her respiration is 16 and oxygen saturation is 100%.   Wt Readings from Last 3 Encounters:  06/27/16 176 lb 1.9 oz (79.9 kg)  05/31/16 172 lb (78 kg)  04/07/16 179 lb 4 oz (81.3 kg)     Elderly appearing white female. She is in no distress. Head and neck exam shows no ocular or oral lesions. She has no scleral icterus. There is no adenopathy in the neck. Lungs are clear bilaterally. Cardiac exam regular rate and rhythm with an occasional extra beat. She has no murmurs, rubs or bruits. Abdomen is soft. She has good bowel sounds. There is no obvious guarding or rebound tenderness. There is no fluid wave. I really cannot palpate her liver or spleen tip. Extremities shows 4+ edema in her lower legs. She has wrappings around her lower legs.   Lab Results  Component Value Date   WBC 10.8 (H) 06/27/2016   HGB 9.9 (L) 06/27/2016   HCT 30.8 (L) 06/27/2016   MCV 84 06/27/2016   PLT 360 06/27/2016   Lab Results  Component Value Date   FERRITIN 664 (H) 04/26/2016   IRON 24 (L) 04/26/2016   TIBC 148 (L) 04/26/2016   UIBC 124 04/26/2016   IRONPCTSAT 16 (L) 04/26/2016   Lab Results  Component Value Date   RETICCTPCT 1.1 11/25/2014   RBC 3.67 (L) 06/27/2016   RETICCTABS 35.6 11/25/2014   No results found for: KPAFRELGTCHN, LAMBDASER, KAPLAMBRATIO No results found for: IGGSERUM, IGA, IGMSERUM No results found for: TOTALPROTELP, ALBUMINELP, A1GS, A2GS, [name] [name], [name] [name], [name] [name], SPEI   Chemistry      Component Value Date/Time   NA 129 (L) 05/31/2016 1526   K 3.6 05/31/2016 1526   CL 94 (L) 05/31/2016 1526   CO2 27 05/31/2016 1526   BUN 12 05/31/2016 1526   CREATININE 0.95 05/31/2016 1526   CREATININE 1.56 (H) 01/20/2016 1639      Component Value Date/Time   CALCIUM 8.9 05/31/2016 1526   ALKPHOS 316 (H) 05/31/2016 1526   AST 22 05/31/2016 1526   ALT 9 05/31/2016 1526   BILITOT 0.4 05/31/2016 1526     Impression and Plan: [name] [name] is a very charming 83 yo white female with metastatic colon cancer. I think the problem that we are going to have is that she is not a candidate for any systemic therapy given her somewhat poor performance status.  However, I think  the colonoscopy will be incredibly important. I did a colonoscopy will show [place] whether or not we have to consider her for surgery. I realize this will be a fairly significant undertaking. I would not want to see her get obstructed or bleed which would be miserable for her.  Again, her daughter really does not want her knowing how bad the situation is. We did not discuss end-of-life issues. We did not discuss the fact that she has liver metastasis.  Again, she is not a candidate for systemic chemotherapy from my  point of view. I think of anything is done it will be some form of local intervention for the primary tumor.   We will have to see what her CEA level is.   I will plan to see her back probably next week, depending on what we find with the colonoscopy.   I spent about 40 minutes with she and her daughter.    Volanda Napoleon, MD 5/7/20183:17 PM

## 2016-06-28 LAB — FERRITIN: FERRITIN: 803 ng/mL — AB (ref 9–269)

## 2016-06-28 LAB — RETICULOCYTES: RETICULOCYTE COUNT: 1.2 % (ref 0.6–2.6)

## 2016-06-28 LAB — IRON AND TIBC
%SAT: 16 % — AB (ref 21–57)
IRON: 19 ug/dL — AB (ref 41–142)
TIBC: 123 ug/dL — ABNORMAL LOW (ref 236–444)
UIBC: 103 ug/dL — AB (ref 120–384)

## 2016-06-30 ENCOUNTER — Ambulatory Visit (HOSPITAL_COMMUNITY)
Admission: RE | Admit: 2016-06-30 | Discharge: 2016-06-30 | Disposition: A | Discharge status: Home / Self Care | Payer: Medicare Other | Source: Ambulatory Visit | Attending: Gastroenterology | Admitting: Gastroenterology

## 2016-06-30 ENCOUNTER — Ambulatory Visit (HOSPITAL_COMMUNITY): Payer: Medicare Other | Admitting: Anesthesiology

## 2016-06-30 ENCOUNTER — Encounter (HOSPITAL_COMMUNITY)
Admission: RE | Disposition: A | Discharge status: Home / Self Care | Payer: Self-pay | Source: Ambulatory Visit | Attending: Gastroenterology

## 2016-06-30 ENCOUNTER — Encounter (HOSPITAL_COMMUNITY): Payer: Self-pay | Admitting: Gastroenterology

## 2016-06-30 DIAGNOSIS — I739 Peripheral vascular disease, unspecified: Secondary | ICD-10-CM | POA: Insufficient documentation

## 2016-06-30 DIAGNOSIS — Z79899 Other long term (current) drug therapy: Secondary | ICD-10-CM | POA: Diagnosis not present

## 2016-06-30 DIAGNOSIS — C184 Malignant neoplasm of transverse colon: Secondary | ICD-10-CM | POA: Diagnosis not present

## 2016-06-30 DIAGNOSIS — K5730 Diverticulosis of large intestine without perforation or abscess without bleeding: Secondary | ICD-10-CM | POA: Insufficient documentation

## 2016-06-30 DIAGNOSIS — E1165 Type 2 diabetes mellitus with hyperglycemia: Secondary | ICD-10-CM | POA: Diagnosis not present

## 2016-06-30 DIAGNOSIS — C187 Malignant neoplasm of sigmoid colon: Secondary | ICD-10-CM | POA: Diagnosis not present

## 2016-06-30 DIAGNOSIS — Z8673 Personal history of transient ischemic attack (TIA), and cerebral infarction without residual deficits: Secondary | ICD-10-CM | POA: Diagnosis not present

## 2016-06-30 DIAGNOSIS — N289 Disorder of kidney and ureter, unspecified: Secondary | ICD-10-CM | POA: Insufficient documentation

## 2016-06-30 DIAGNOSIS — I10 Essential (primary) hypertension: Secondary | ICD-10-CM | POA: Diagnosis not present

## 2016-06-30 DIAGNOSIS — K6389 Other specified diseases of intestine: Secondary | ICD-10-CM

## 2016-06-30 DIAGNOSIS — Z7902 Long term (current) use of antithrombotics/antiplatelets: Secondary | ICD-10-CM | POA: Insufficient documentation

## 2016-06-30 DIAGNOSIS — D638 Anemia in other chronic diseases classified elsewhere: Secondary | ICD-10-CM | POA: Insufficient documentation

## 2016-06-30 DIAGNOSIS — E1151 Type 2 diabetes mellitus with diabetic peripheral angiopathy without gangrene: Secondary | ICD-10-CM | POA: Insufficient documentation

## 2016-06-30 DIAGNOSIS — Z95810 Presence of automatic (implantable) cardiac defibrillator: Secondary | ICD-10-CM | POA: Insufficient documentation

## 2016-06-30 DIAGNOSIS — R933 Abnormal findings on diagnostic imaging of other parts of digestive tract: Secondary | ICD-10-CM

## 2016-06-30 DIAGNOSIS — C189 Malignant neoplasm of colon, unspecified: Secondary | ICD-10-CM | POA: Diagnosis not present

## 2016-06-30 DIAGNOSIS — D509 Iron deficiency anemia, unspecified: Secondary | ICD-10-CM | POA: Insufficient documentation

## 2016-06-30 DIAGNOSIS — Z794 Long term (current) use of insulin: Secondary | ICD-10-CM | POA: Diagnosis not present

## 2016-06-30 DIAGNOSIS — D490 Neoplasm of unspecified behavior of digestive system: Secondary | ICD-10-CM

## 2016-06-30 DIAGNOSIS — Z7982 Long term (current) use of aspirin: Secondary | ICD-10-CM | POA: Insufficient documentation

## 2016-06-30 DIAGNOSIS — C787 Secondary malignant neoplasm of liver and intrahepatic bile duct: Secondary | ICD-10-CM | POA: Diagnosis not present

## 2016-06-30 HISTORY — PX: COLONOSCOPY WITH PROPOFOL: SHX5780

## 2016-06-30 LAB — GLUCOSE, CAPILLARY: GLUCOSE-CAPILLARY: 101 mg/dL — AB (ref 65–99)

## 2016-06-30 SURGERY — COLONOSCOPY WITH PROPOFOL
Anesthesia: Monitor Anesthesia Care

## 2016-06-30 MED ORDER — SPOT INK MARKER SYRINGE KIT
PACK | SUBMUCOSAL | Status: AC
  Filled 2016-06-30: qty 5

## 2016-06-30 MED ORDER — SODIUM CHLORIDE 0.9 % IV SOLN: INTRAVENOUS | Status: DC

## 2016-06-30 MED ORDER — SPOT INK MARKER SYRINGE KIT
PACK | SUBMUCOSAL | Status: DC | PRN
  Administered 2016-06-30: 2 mL via SUBMUCOSAL

## 2016-06-30 MED ORDER — PROPOFOL 500 MG/50ML IV EMUL
INTRAVENOUS | Status: DC | PRN
  Administered 2016-06-30: 75 ug/kg/min via INTRAVENOUS

## 2016-06-30 MED ORDER — PROPOFOL 10 MG/ML IV BOLUS
INTRAVENOUS | Status: AC
  Filled 2016-06-30: qty 40

## 2016-06-30 MED ORDER — LACTATED RINGERS IV SOLN
INTRAVENOUS | Status: DC
  Administered 2016-06-30: 1000 mL via INTRAVENOUS

## 2016-06-30 MED ORDER — PROPOFOL 500 MG/50ML IV EMUL
INTRAVENOUS | Status: DC | PRN
  Administered 2016-06-30 (×2): 10 mg via INTRAVENOUS
  Administered 2016-06-30: 30 mg via INTRAVENOUS
  Administered 2016-06-30 (×2): 10 mg via INTRAVENOUS

## 2016-06-30 SURGICAL SUPPLY — 21 items

## 2016-06-30 NOTE — Interval H&P Note (Signed)
History and Physical Interval Note:  06/30/2016 12:28 PM  [name] [name]  has presented today for surgery, with the diagnosis of colon mass   The various methods of treatment have been discussed with the patient and family. After consideration of risks, benefits and other options for treatment, the patient has consented to  Procedure(s): COLONOSCOPY WITH PROPOFOL (N/A) as a surgical intervention .  The patient's history has been reviewed, patient examined, no change in status, stable for surgery.  I have reviewed the patient's chart and labs.  Questions were answered to the patient's satisfaction.     Milus Banister

## 2016-06-30 NOTE — Anesthesia Preprocedure Evaluation (Signed)
Anesthesia Evaluation  Patient identified by MRN, date of birth, ID band Patient awake    Reviewed: Allergy & Precautions, NPO status , Patient's Chart, lab work & pertinent test results  Airway Mallampati: II  TM Distance: >3 FB Neck ROM: Full    Dental no notable dental hx.    Pulmonary neg pulmonary ROS,    Pulmonary exam normal breath sounds clear to auscultation       Cardiovascular hypertension, + Peripheral Vascular Disease  Normal cardiovascular exam+ Cardiac Defibrillator  Rhythm:Regular Rate:Normal     Neuro/Psych CVA negative psych ROS   GI/Hepatic negative GI ROS, Neg liver ROS,   Endo/Other  negative endocrine ROSdiabetes  Renal/GU negative Renal ROS  negative genitourinary   Musculoskeletal negative musculoskeletal ROS (+)   Abdominal   Peds negative pediatric ROS (+)  Hematology negative hematology ROS (+)   Anesthesia Other Findings   Reproductive/Obstetrics negative OB ROS                             Anesthesia Physical Anesthesia Plan  ASA: III  Anesthesia Plan: MAC   Post-op Pain Management:    Induction: Intravenous  Airway Management Planned: Nasal Cannula  Additional Equipment:   Intra-op Plan:   Post-operative Plan:   Informed Consent: I have reviewed the patients History and Physical, chart, labs and discussed the procedure including the risks, benefits and alternatives for the proposed anesthesia with the patient or authorized representative who has indicated his/her understanding and acceptance.   Dental advisory given  Plan Discussed with: CRNA and Surgeon  Anesthesia Plan Comments:         Anesthesia Quick Evaluation

## 2016-06-30 NOTE — Op Note (Signed)
[facility] Patient Name: [name] [name] Procedure Date: 06/30/2016 MRN: [id number] Attending MD: Milus Banister , MD Date of Birth: June 15, 1932 CSN: [id number] Age: 83 Admit Type: Outpatient Procedure:                Colonoscopy Indications:              Abnormal CT of the colon; malignant appearing mass                            in transverse colon, masses in liver Providers:                Milus Banister, MD, Elmer Ramp. Tilden Dome, RN,                            William Dalton, Technician, Alan Mulder,                            Technician, Rosario Adie, CRNA Referring MD:              Medicines:                Monitored Anesthesia Care Complications:            No immediate complications. Estimated blood loss:                            None. Estimated Blood Loss:     Estimated blood loss: none. Procedure:                Pre-Anesthesia Assessment:                           - Prior to the procedure, a History and Physical                            was performed, and patient medications and                            allergies were reviewed. The patient's tolerance of                            previous anesthesia was also reviewed. The risks                            and benefits of the procedure and the sedation                            options and risks were discussed with the patient.                            All questions were answered, and informed consent                            was obtained. Prior Anticoagulants: The patient has  taken Plavix (clopidogrel), last dose was 4 days                            prior to procedure. ASA Grade Assessment: III - A                            patient with severe systemic disease. After                            reviewing the risks and benefits, the patient was                            deemed in satisfactory condition to undergo the                            procedure.                  After obtaining informed consent, the colonoscope                            was passed under direct vision. Throughout the                            procedure, the patient's blood pressure, pulse, and                            oxygen saturations were monitored continuously. The                            EC-[id number] ([id number]) scope was introduced through                            the anus and advanced to the the transverse colon                            to examine a mass. This was the intended extent.                            The colonoscopy was performed without difficulty.                            The patient tolerated the procedure well. The                            quality of the bowel preparation was excellent. The                            rectum was photographed. Scope In: 1:12:26 PM Scope Out: 1:35:39 PM Total Procedure Duration: 0 hours 23 minutes 13 seconds  Findings:      The most distal edge of the transverse colon mass was found, this       appeared malignant. I was unable to advance the scope more proximally       into the colon seemingly due to  anatomic distortion related to the       tumor. Cannot just this tumors size, morphology. Biopsies were taken       with a cold forceps for histology.      An ulcerated non-obstructing small mass was found in the sigmoid colon.       The mass was non-circumferential. The mass measured two cm in length.       Biopsies were taken with a cold forceps for histology. The distal edge       of the mass was tattooed with an injection of [place] ink.      Many small and large-mouthed diverticula were found in the left colon.      The exam was otherwise without abnormality on direct and retroflexion       views. Impression:               - Apparent synchronous colon cancers; the largest                            was in the proximal transverse colon and I could                            only appreciate the most  distal edge of this mass                            due to anatomic distortion. This mass was biopsied.                            The smaller mass (also likely a cancer) was in the                            sigmoid segment. This mass was also biopsied and                            then the site was labeled with [place] Ink to aid in                            future localization if needed. Moderate Sedation:      N/A- Per Anesthesia Care Recommendation:           - Patient has a contact number available for                            emergencies. The signs and symptoms of potential                            delayed complications were discussed with the                            patient. Return to normal activities tomorrow.                            Written discharge instructions were provided to the  patient.                           - Resume previous diet.                           - Continue present medications.                           - No repeat colonoscopy. Procedure Code(s):        --- Professional ---                           [phone], 32, Colonoscopy, flexible; with biopsy,                            single or multiple                           45381, 30, Colonoscopy, flexible; with directed                            submucosal injection(s), any substance Diagnosis Code(s):        --- Professional ---                           C18.4, Malignant neoplasm of transverse colon                           D49.0, Neoplasm of unspecified behavior of                            digestive system                           K57.30, Diverticulosis of large intestine without                            perforation or abscess without bleeding                           R93.3, Abnormal findings on diagnostic imaging of                            other parts of digestive tract CPT copyright 2016 American [employer]. All rights reserved. The codes documented  in this report are preliminary and upon coder review may  be revised to meet current compliance requirements. Milus Banister, MD 06/30/2016 1:46:10 PM This report has been signed electronically. Number of Addenda: 0

## 2016-06-30 NOTE — Discharge Instructions (Signed)

## 2016-06-30 NOTE — H&P (View-Only) (Signed)
Hematology and Oncology Follow Up Visit  [name] [name] [id number] 1932-07-25 83 y.o. 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 Shockingly, whose I she has metastatic colon cancer. She showed up with some abdominal pain. She has been seen by Dr. Larose Kells. She had a CT of the abdomen and pelvis was done. This showed multiple liver metastasis. She had a tumor in the transverse colon.  The tumor itself measured 6.7 x 6.1 x 5.8 cm. There is some slightly enlarged lymph nodes. There were no nodules in the lung had the bases.  She is to undergo a colonoscopy on Thursday.  She has no nausea or vomiting. She's not eating as much air and her weight is holding pretty steady.  Not sure she really knows how much is going on. Her daughter really does not want her to know how bad the situation is.  She comes in a wheelchair. She is doing not walking much. She has severe chronic edema of her legs.  Overall, I would have to say that her performance status is at best, ECOG 3.   Medications:  Allergies as of 06/27/2016   No Known Allergies     Medication List       Accurate as of 06/27/16  3:17 PM. Always use your most recent med list.          acetaminophen 500 MG tablet Commonly known as:  TYLENOL Take 1,000 mg by mouth every 8 (eight) hours as needed.   aspirin 325 MG tablet Take 325 mg by mouth daily.   atorvastatin 40 MG tablet Commonly known as:  LIPITOR Take 1 tablet (40 mg total) by mouth daily.   clonazePAM 1 MG tablet Commonly known as:  KLONOPIN Take 0.5 tablets  (0.5 mg total) by mouth at bedtime as needed for anxiety.   clopidogrel 75 MG tablet Commonly known as:  PLAVIX Take 1 tablet (75 mg total) by mouth daily.   docusate sodium 100 MG capsule Commonly known as:  COLACE Take 100 mg by mouth daily as needed for mild constipation.   furosemide 20 MG tablet Commonly known as:  LASIX Take 1 tablet (20 mg total) by mouth daily.   glimepiride 1 MG tablet Commonly known as:  AMARYL Take 1 tablet (1 mg total) by mouth daily with breakfast.   glucose blood test strip Check blood sugars twice daily.   HYDROcodone-acetaminophen 5-325 MG tablet Commonly known as:  NORCO/VICODIN 1/2 to 1 tablet every 8 hrs as needed for pain   metoprolol 50 MG tablet Commonly known as:  LOPRESSOR Take 1 tablet (50 mg total) by mouth 2 (two) times daily.   mupirocin ointment 2 % Commonly known as:  BACTROBAN Apply 1 application topically 2 (two) times daily.   nitroGLYCERIN 0.4 MG SL tablet Commonly known as:  NITROSTAT Place 1 tablet (0.4 mg total) under the tongue every 5 (five) minutes as needed for chest pain. Contact your doctor and/or seek medial attention right away if chest pain   pantoprazole 40 MG tablet Commonly known as:  PROTONIX Take 1 tablet (40 mg total) by mouth  daily.   potassium chloride 10 MEQ tablet Commonly known as:  K-DUR,KLOR-CON Take 1 tablet (10 mEq total) by mouth daily.       Allergies: No Known Allergies  Past Medical History, Surgical history, Social history, and Family History were reviewed and updated.  Review of Systems: All other 10 point review of systems is negative.   Physical Exam:  weight is 176 lb 1.9 oz (79.9 kg). Her oral temperature is 98 F (36.7 C). Her blood pressure is 127/48 (abnormal) and her pulse is 60. Her respiration is 16 and oxygen saturation is 100%.   Wt Readings from Last 3 Encounters:  06/27/16 176 lb 1.9 oz (79.9 kg)  05/31/16 172 lb (78 kg)  04/07/16 179 lb 4 oz (81.3 kg)     Elderly appearing white female. She is in no distress. Head and neck exam shows no ocular or oral lesions. She has no scleral icterus. There is no adenopathy in the neck. Lungs are clear bilaterally. Cardiac exam regular rate and rhythm with an occasional extra beat. She has no murmurs, rubs or bruits. Abdomen is soft. She has good bowel sounds. There is no obvious guarding or rebound tenderness. There is no fluid wave. I really cannot palpate her liver or spleen tip. Extremities shows 4+ edema in her lower legs. She has wrappings around her lower legs.   Lab Results  Component Value Date   WBC 10.8 (H) 06/27/2016   HGB 9.9 (L) 06/27/2016   HCT 30.8 (L) 06/27/2016   MCV 84 06/27/2016   PLT 360 06/27/2016   Lab Results  Component Value Date   FERRITIN 664 (H) 04/26/2016   IRON 24 (L) 04/26/2016   TIBC 148 (L) 04/26/2016   UIBC 124 04/26/2016   IRONPCTSAT 16 (L) 04/26/2016   Lab Results  Component Value Date   RETICCTPCT 1.1 11/25/2014   RBC 3.67 (L) 06/27/2016   RETICCTABS 35.6 11/25/2014   No results found for: KPAFRELGTCHN, LAMBDASER, KAPLAMBRATIO No results found for: IGGSERUM, IGA, IGMSERUM No results found for: TOTALPROTELP, ALBUMINELP, A1GS, A2GS, Arnaldo Natal, GAMS, MSPIKE, SPEI   Chemistry      Component Value Date/Time   NA 129 (L) 05/31/2016 1526   K 3.6 05/31/2016 1526   CL 94 (L) 05/31/2016 1526   CO2 27 05/31/2016 1526   BUN 12 05/31/2016 1526   CREATININE 0.95 05/31/2016 1526   CREATININE 1.56 (H) 01/20/2016 1639      Component Value Date/Time   CALCIUM 8.9 05/31/2016 1526   ALKPHOS 316 (H) 05/31/2016 1526   AST 22 05/31/2016 1526   ALT 9 05/31/2016 1526   BILITOT 0.4 05/31/2016 1526     Impression and Plan: [name] [name] is a very charming 83 yo white female with metastatic colon cancer. I think the problem that we are going to have is that she is not a candidate for any systemic therapy given her somewhat poor performance status.  However, I think  the colonoscopy will be incredibly important. I did a colonoscopy will show [place] whether or not we have to consider her for surgery. I realize this will be a fairly significant undertaking. I would not want to see her get obstructed or bleed which would be miserable for her.  Again, her daughter really does not want her knowing how bad the situation is. We did not discuss end-of-life issues. We did not discuss the fact that she has liver metastasis.  Again, she is not a candidate for systemic chemotherapy from my  point of view. I think of anything is done it will be some form of local intervention for the primary tumor.   We will have to see what her CEA level is.   I will plan to see her back probably next week, depending on what we find with the colonoscopy.   I spent about 40 minutes with she and her daughter.    Volanda Napoleon, MD 5/7/20183:17 PM

## 2016-06-30 NOTE — Transfer of Care (Signed)
Immediate Anesthesia Transfer of Care Note  Patient: [name] [name]  Procedure(s) Performed: Procedure(s): COLONOSCOPY WITH PROPOFOL (N/A)  Patient Location: PACU  Anesthesia Type:MAC  Level of Consciousness: awake, alert  and oriented  Airway & Oxygen Therapy: Patient Spontanous Breathing and Patient connected to face mask oxygen  Post-op Assessment: Report given to RN and Post -op Vital signs reviewed and stable  Post vital signs: Reviewed and stable  Last Vitals:  Vitals:   06/30/16 1248  BP: (!) 154/44  Pulse: 77  Resp: 20    Last Pain: There were no vitals filed for this visit.       Complications: No apparent anesthesia complications

## 2016-07-01 ENCOUNTER — Encounter (HOSPITAL_COMMUNITY): Payer: Self-pay | Admitting: Gastroenterology

## 2016-07-01 NOTE — Anesthesia Postprocedure Evaluation (Signed)
Anesthesia Post Note  Patient: [name] [name]  Procedure(s) Performed: Procedure(s) (LRB): COLONOSCOPY WITH PROPOFOL (N/A)  Patient location during evaluation: PACU Anesthesia Type: MAC Level of consciousness: awake and alert Pain management: pain level controlled Vital Signs Assessment: post-procedure vital signs reviewed and stable Respiratory status: spontaneous breathing, nonlabored ventilation, respiratory function stable and patient connected to nasal cannula oxygen Cardiovascular status: stable and blood pressure returned to baseline Anesthetic complications: no       Last Vitals:  Vitals:   06/30/16 1415 06/30/16 1420  BP:    Pulse: 66 63  Resp: 14 17  Temp:      Last Pain:  Vitals:   06/30/16 1345  TempSrc: Oral                 Ziyon Soltau S

## 2016-07-05 ENCOUNTER — Other Ambulatory Visit: Payer: Self-pay | Admitting: Internal Medicine

## 2016-07-11 ENCOUNTER — Other Ambulatory Visit: Payer: Self-pay | Admitting: Nurse Practitioner

## 2016-07-11 ENCOUNTER — Telehealth: Payer: Self-pay | Admitting: Internal Medicine

## 2016-07-11 MED ORDER — CLONAZEPAM 1 MG PO TABS: 0.5000 mg | ORAL_TABLET | Freq: Every evening | ORAL | 1 refills | Status: AC | PRN

## 2016-07-11 NOTE — Telephone Encounter (Signed)
Pt is requesting refill on Clonazepam.  Last OV: 05/31/2016 Last Fill: 04/07/2016 #30 and 1RF Pt sig: 1/2 tab qhs prn UDS: 06/02/2016 Low risk  Please advise.

## 2016-07-11 NOTE — Telephone Encounter (Signed)
Rx printed, awaiting MD signature.  

## 2016-07-11 NOTE — Telephone Encounter (Addendum)
Relation to IP:PGFQ Call back number:[phone] Pharmacy: [facility] [phone] (Phone) [phone] (Fax)     Reason for call:  Requesting a refill clonazePAM (KLONOPIN) 1 MG tablet

## 2016-07-11 NOTE — Telephone Encounter (Signed)
Okay #30, 1 refill 

## 2016-07-11 NOTE — Telephone Encounter (Signed)
Rx faxed to [facility] pharmacy.  

## 2016-07-14 ENCOUNTER — Telehealth: Payer: Self-pay | Admitting: *Deleted

## 2016-07-14 ENCOUNTER — Other Ambulatory Visit: Payer: Self-pay | Admitting: *Deleted

## 2016-07-14 DIAGNOSIS — C787 Secondary malignant neoplasm of liver and intrahepatic bile duct: Secondary | ICD-10-CM | POA: Diagnosis not present

## 2016-07-14 DIAGNOSIS — K219 Gastro-esophageal reflux disease without esophagitis: Secondary | ICD-10-CM | POA: Diagnosis not present

## 2016-07-14 DIAGNOSIS — C189 Malignant neoplasm of colon, unspecified: Secondary | ICD-10-CM | POA: Diagnosis not present

## 2016-07-14 DIAGNOSIS — I2510 Atherosclerotic heart disease of native coronary artery without angina pectoris: Secondary | ICD-10-CM | POA: Diagnosis not present

## 2016-07-14 DIAGNOSIS — R609 Edema, unspecified: Secondary | ICD-10-CM | POA: Diagnosis not present

## 2016-07-14 DIAGNOSIS — I10 Essential (primary) hypertension: Secondary | ICD-10-CM | POA: Diagnosis not present

## 2016-07-14 DIAGNOSIS — E119 Type 2 diabetes mellitus without complications: Secondary | ICD-10-CM | POA: Diagnosis not present

## 2016-07-14 NOTE — Telephone Encounter (Signed)
Daughter [name] is requesting a hospice consult for her mother. She has been recently diagnosed with colon cancer and has been declining. She would like to have a discussion with Hospice as to what services they can provide for the patient/family.   Spoke with Terri as well, and both sisters would like the consult. Dr Marin Olp is fine with ordering consult.  Consult made, spoke with Amber at [place] [facility].

## 2016-07-15 ENCOUNTER — Telehealth: Payer: Self-pay | Admitting: *Deleted

## 2016-07-15 DIAGNOSIS — C189 Malignant neoplasm of colon, unspecified: Secondary | ICD-10-CM | POA: Diagnosis not present

## 2016-07-15 DIAGNOSIS — C787 Secondary malignant neoplasm of liver and intrahepatic bile duct: Secondary | ICD-10-CM | POA: Diagnosis not present

## 2016-07-15 DIAGNOSIS — E119 Type 2 diabetes mellitus without complications: Secondary | ICD-10-CM | POA: Diagnosis not present

## 2016-07-15 DIAGNOSIS — I2510 Atherosclerotic heart disease of native coronary artery without angina pectoris: Secondary | ICD-10-CM | POA: Diagnosis not present

## 2016-07-15 DIAGNOSIS — I10 Essential (primary) hypertension: Secondary | ICD-10-CM | POA: Diagnosis not present

## 2016-07-15 DIAGNOSIS — R609 Edema, unspecified: Secondary | ICD-10-CM | POA: Diagnosis not present

## 2016-07-15 NOTE — Telephone Encounter (Signed)
MEgan from [facility] called asking if Dr. Marin Olp needed any labwork drawn on patient.  Dr. Marin Olp ordered CBC and CMEt drawn at next visit.

## 2016-07-16 DIAGNOSIS — R609 Edema, unspecified: Secondary | ICD-10-CM | POA: Diagnosis not present

## 2016-07-16 DIAGNOSIS — C787 Secondary malignant neoplasm of liver and intrahepatic bile duct: Secondary | ICD-10-CM | POA: Diagnosis not present

## 2016-07-16 DIAGNOSIS — E119 Type 2 diabetes mellitus without complications: Secondary | ICD-10-CM | POA: Diagnosis not present

## 2016-07-16 DIAGNOSIS — C189 Malignant neoplasm of colon, unspecified: Secondary | ICD-10-CM | POA: Diagnosis not present

## 2016-07-16 DIAGNOSIS — I10 Essential (primary) hypertension: Secondary | ICD-10-CM | POA: Diagnosis not present

## 2016-07-16 DIAGNOSIS — I2510 Atherosclerotic heart disease of native coronary artery without angina pectoris: Secondary | ICD-10-CM | POA: Diagnosis not present

## 2016-07-17 ENCOUNTER — Inpatient Hospital Stay (HOSPITAL_COMMUNITY)
Admission: EM | Admit: 2016-07-17 | Discharge: 2016-07-21 | DRG: 682 | Disposition: A | Discharge status: Home / Self Care | Attending: Family Medicine | Admitting: Family Medicine

## 2016-07-17 DIAGNOSIS — L8990 Pressure ulcer of unspecified site, unspecified stage: Secondary | ICD-10-CM | POA: Insufficient documentation

## 2016-07-17 DIAGNOSIS — E119 Type 2 diabetes mellitus without complications: Secondary | ICD-10-CM | POA: Diagnosis present

## 2016-07-17 DIAGNOSIS — N3000 Acute cystitis without hematuria: Secondary | ICD-10-CM

## 2016-07-17 DIAGNOSIS — Z515 Encounter for palliative care: Secondary | ICD-10-CM | POA: Diagnosis present

## 2016-07-17 DIAGNOSIS — C787 Secondary malignant neoplasm of liver and intrahepatic bile duct: Secondary | ICD-10-CM | POA: Diagnosis not present

## 2016-07-17 DIAGNOSIS — E1159 Type 2 diabetes mellitus with other circulatory complications: Secondary | ICD-10-CM | POA: Diagnosis present

## 2016-07-17 DIAGNOSIS — R627 Adult failure to thrive: Secondary | ICD-10-CM

## 2016-07-17 DIAGNOSIS — N179 Acute kidney failure, unspecified: Principal | ICD-10-CM | POA: Diagnosis present

## 2016-07-17 DIAGNOSIS — Z7189 Other specified counseling: Secondary | ICD-10-CM

## 2016-07-17 DIAGNOSIS — C189 Malignant neoplasm of colon, unspecified: Secondary | ICD-10-CM | POA: Diagnosis not present

## 2016-07-17 DIAGNOSIS — R609 Edema, unspecified: Secondary | ICD-10-CM | POA: Diagnosis not present

## 2016-07-17 DIAGNOSIS — Z951 Presence of aortocoronary bypass graft: Secondary | ICD-10-CM

## 2016-07-17 DIAGNOSIS — Z79899 Other long term (current) drug therapy: Secondary | ICD-10-CM

## 2016-07-17 DIAGNOSIS — E876 Hypokalemia: Secondary | ICD-10-CM | POA: Diagnosis not present

## 2016-07-17 DIAGNOSIS — G9341 Metabolic encephalopathy: Secondary | ICD-10-CM | POA: Diagnosis present

## 2016-07-17 DIAGNOSIS — Z7982 Long term (current) use of aspirin: Secondary | ICD-10-CM

## 2016-07-17 DIAGNOSIS — R402411 Glasgow coma scale score 13-15, in the field [EMT or ambulance]: Secondary | ICD-10-CM | POA: Diagnosis not present

## 2016-07-17 DIAGNOSIS — I10 Essential (primary) hypertension: Secondary | ICD-10-CM | POA: Diagnosis present

## 2016-07-17 DIAGNOSIS — I2510 Atherosclerotic heart disease of native coronary artery without angina pectoris: Secondary | ICD-10-CM | POA: Diagnosis present

## 2016-07-17 DIAGNOSIS — Z95810 Presence of automatic (implantable) cardiac defibrillator: Secondary | ICD-10-CM | POA: Diagnosis present

## 2016-07-17 DIAGNOSIS — K219 Gastro-esophageal reflux disease without esophagitis: Secondary | ICD-10-CM | POA: Diagnosis present

## 2016-07-17 DIAGNOSIS — E875 Hyperkalemia: Secondary | ICD-10-CM | POA: Diagnosis present

## 2016-07-17 DIAGNOSIS — R4182 Altered mental status, unspecified: Secondary | ICD-10-CM | POA: Diagnosis not present

## 2016-07-17 DIAGNOSIS — I5022 Chronic systolic (congestive) heart failure: Secondary | ICD-10-CM | POA: Diagnosis present

## 2016-07-17 DIAGNOSIS — I110 Hypertensive heart disease with heart failure: Secondary | ICD-10-CM | POA: Diagnosis present

## 2016-07-17 DIAGNOSIS — N390 Urinary tract infection, site not specified: Secondary | ICD-10-CM | POA: Diagnosis present

## 2016-07-17 DIAGNOSIS — J690 Pneumonitis due to inhalation of food and vomit: Secondary | ICD-10-CM | POA: Diagnosis present

## 2016-07-17 DIAGNOSIS — E860 Dehydration: Secondary | ICD-10-CM | POA: Diagnosis present

## 2016-07-17 NOTE — ED Triage Notes (Signed)
Patient arrived with EMS from home, Per EMS family is concern of patient AMS and weakness started 2 weeks ago. No complains of fall at home. Patient is A/O x 2. Bruises and edema noted on pt extremities.

## 2016-07-17 NOTE — ED Notes (Signed)
Bed: WA23 Expected date:  Expected time:  Means of arrival:  Comments: 

## 2016-07-17 NOTE — ED Provider Notes (Signed)
[name] DEPT Provider Note   CSN: [id number] Arrival date & time: 07/17/16  2334   By signing my name below, I, Eunice Blase, attest that this documentation has been prepared under the direction and in the presence of [place], Izaan Kingbird, MD. Electronically signed, Eunice Blase, ED Scribe. 07/18/16. 12:59 AM.   History   Chief Complaint Chief Complaint  Patient presents with  . Altered Mental Status   The history is provided by medical records and a relative. No language interpreter was used.  Altered Mental Status   This is a new problem. The current episode started more than 1 week ago. The problem has been gradually worsening. Associated symptoms include confusion and weakness (global weakness). Pertinent negatives include no seizures, no unresponsiveness, no agitation, no hallucinations, no self-injury and no violence. Risk factors include a recent illness. Her past medical history does not include seizures, liver disease or depression. Past medical history comments: colon cancer.    Pt not present on evaluation. Pt in radiology; family provides history.  [name] [name] is a 83 y.o. female with h/o stage IV colon cancer BIB EMS who presents to the Emergency Department with concern for gradually declining mental status x 2 weeks. Associated decreased appetite, bruising and edema on extremities, decreased mobility from baseline, incontinence x "the last few days", weakness, fatigue, decreased memory and decreased concentration noted. Pt allegedly progressively non weight bearing since onset of symptoms though she is ambulatory with cane at baseline. Family adds they have had to assist her in standing since progressive onset of presenting symptoms. Pt prescribed hydrocodone; family states the pt has inconsistently taken 1/2 occasionally. No complications noted related to this medication. Colonoscopy reported "couple weeks ago"; possibility of chemotherapy discussed at this evaluation.  Hospice consult noted for help with pt's recently declining mobility; DNR discussed previously but not yet pursued. Family not pursuing chemotherapy until etiology of presenting symptoms is found. No prior PET scan noted. No prior C/T scan noted. Family unsure if pt's cancer is in the bone.  1:05 AM Pt present for evaluation and exam. She states she drinks ensure "a couple times a day" without supplements. She adds she is able to eat yogurt without complication. Throat pain described as lower vs. in the back of the mouth noted. She also notes intermittent central abdominal pain, constipation and dysuria that is worse at night. Pt describes her abdominal as "just pain" not present currently. No other complaints to note at this time.   Past Medical History:  Diagnosis Date  . Abnormal CT scan, chest    last 06-08-2009, see report   . Anemia    iron def   . Barrett esophagus    EGD 08/2009, next 2013  . CAD (coronary artery disease)    s/p stent R side 9/08, re stenosis 1/09, s/p angioplasty 1/09 and a stent on 12/09; re-angioplasty 5/11  . Carotid arterial disease (HCC)    s/p stent R side 9-08, re stenosis 1-09, s/p angioplasty 1-09 and a stent on 12-09, last  angiogram 06-24-2009  . CHF (congestive heart failure) ([place])   . Collagen vascular disease ([place])   . Colon cancer metastasized to liver ([place]) 06/27/2016  . Diabetes mellitus   . Diverticulitis 12/08  . GERD (gastroesophageal reflux disease)    occ  . Hyperlipidemia   . Hypertension   . ICD (implantable cardiac defibrillator) in place     12-08-- [employer]   . 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 Stroke [facility])  Patient Active Problem List   Diagnosis Date Noted  . Colonic mass   . Colon cancer metastasized to liver ([place]) 06/27/2016  . Onychomycosis of toenail 03/03/2016  . Midfoot ulcer, left, limited to breakdown of skin ([place]) 01/13/2016  . Idiopathic chronic venous hypertension of left lower extremity with ulcer and  inflammation ([place]) 01/13/2016  . PCP NOTES >>>>>>>>>>>>>>>>>>>>> 09/30/2015  . Annual physical exam 08/28/2013  . Elevated alkaline phosphatase level 12/19/2012  . Constipation 07/11/2012  . Diabetic foot ulcer- LEFT-s/p amputation first Ray 2014 Dr. Sharol Given 04/11/2012  . Coronary artery disease 09/05/2010  . GERD (gastroesophageal reflux disease) 07/01/2010  . Anemia, iron deficiency 11/06/2009  . CT, CHEST, ABNORMAL 06/10/2009  . CARDIOMYOPATHY, ISCHEMIC 04/14/2009  . Chronic systolic heart failure ([place]) 04/14/2009  . AUTOMATIC IMPLANTABLE CARDIAC DEFIBRILLATOR SITU 04/14/2009  . DIVERTICULOSIS OF COLON 07/18/2008  . LIVER FUNCTION TESTS, ABNORMAL, HX OF 07/18/2008  . Insomnia 11/05/2007  . GAIT DISTURBANCE 06/07/2007  . CHOLELITHIASIS 03/09/2007  . Carotid artery disease ([place]) 02/12/2007  . Diabetic polyneuropathy ([place]) 10/27/2006  . Dyslipidemia 10/27/2006  . HTN (hypertension) 10/27/2006    Past Surgical History:  Procedure Laterality Date  . AMPUTATION Left 06/08/2012   Procedure: AMPUTATION RAY;  Surgeon: Newt Minion, MD;  Location: [place];  Service: Orthopedics;  Laterality: Left;  Left Foot 1st Ray Amputation  . ARTERIAL BYPASS SURGRY     08/08/2000  LIMA to LAD, SVG to intermediate, SVG to OM2, SVG to distal RCA  . CARDIAC DEFIBRILLATOR PLACEMENT     [employer]  . COLONOSCOPY WITH PROPOFOL N/A 06/30/2016   Procedure: COLONOSCOPY WITH PROPOFOL;  Surgeon: Milus Banister, MD;  Location: [facility];  Service: Endoscopy;  Laterality: N/A;    OB History    No data available       Home Medications    Prior to Admission medications   Medication Sig Start Date End Date Taking? Authorizing Provider  acetaminophen (TYLENOL) 500 MG tablet Take 1,000 mg by mouth every 8 (eight) hours as needed for mild pain or moderate pain.    Yes [provider]  aspirin 325 MG tablet Take 325 mg by mouth at bedtime.    Yes [provider]  clonazePAM (KLONOPIN) 1 MG  tablet Take 0.5 tablets (0.5 mg total) by mouth at bedtime as needed for anxiety. 07/11/16  Yes Paz, Alda Berthold, MD  docusate sodium (COLACE) 100 MG capsule Take 100 mg by mouth daily as needed for mild constipation.   Yes [provider]  furosemide (LASIX) 20 MG tablet Take 1 tablet (20 mg total) by mouth daily. 01/21/16  Yes Josue Hector, MD  glimepiride (AMARYL) 1 MG tablet Take 1 tablet (1 mg total) by mouth daily with breakfast. 06/14/16  Yes Paz, Jacqulyn Bath E, MD  glucose blood test strip Check blood sugars twice daily. 01/31/14  Yes Paz, Alda Berthold, MD  HYDROcodone-acetaminophen (NORCO/VICODIN) 5-325 MG tablet 1/2 to 1 tablet every 8 hrs as needed for pain Patient taking differently: Take 0.5-1 tablets by mouth every 8 (eight) hours as needed for moderate pain or severe pain.  06/21/16  Yes Willia Craze, NP  metoprolol (LOPRESSOR) 50 MG tablet Take 1 tablet (50 mg total) by mouth 2 (two) times daily. 05/23/16  Yes Paz, Alda Berthold, MD  mupirocin ointment (BACTROBAN) 2 % Apply 1 application topically 2 (two) times daily. Patient taking differently: Apply 1 application topically 2 (two) times daily as needed (wound care).  03/03/16  Yes  Suzan Slick, NP  nitroGLYCERIN (NITROSTAT) 0.4 MG SL tablet Place 1 tablet (0.4 mg total) under the tongue every 5 (five) minutes as needed for chest pain. Contact your doctor and/or seek medial attention right away if chest pain 09/30/15  Yes Paz, Alda Berthold, MD  pantoprazole (PROTONIX) 40 MG tablet Take 1 tablet (40 mg total) by mouth daily. Patient taking differently: Take 40 mg by mouth daily as needed (heartburn).  05/31/16  Yes Paz, Alda Berthold, MD  potassium chloride (K-DUR,KLOR-CON) 10 MEQ tablet Take 1 tablet (10 mEq total) by mouth daily. Patient taking differently: Take 10 mEq by mouth every morning.  07/05/16  Yes Paz, Alda Berthold, MD  atorvastatin (LIPITOR) 40 MG tablet Take 1 tablet (40 mg total) by mouth daily. Patient not taking: Reported on 07/18/2016 05/11/16   Colon Branch, MD  clopidogrel (PLAVIX) 75 MG tablet Take 1 tablet (75 mg total) by mouth daily. Patient not taking: Reported on 07/18/2016 04/19/16   Colon Branch, MD    Family History Family History  Problem Relation Age of Onset  . Breast cancer Mother 77  . Tuberculosis Father   . Colon cancer Neg Hx     Social History Social History  Substance Use Topics  . Smoking status: Never Smoker  . Smokeless tobacco: Never Used     Comment: never used tobacco  . Alcohol use No     Allergies   Patient has no known allergies.   Review of Systems Review of Systems  Constitutional: Positive for appetite change and fatigue.  Gastrointestinal: Negative for abdominal pain.  Musculoskeletal: Positive for gait problem.  Neurological: Positive for weakness (global weakness). Negative for seizures.  Psychiatric/Behavioral: Positive for confusion and decreased concentration. Negative for agitation, hallucinations and self-injury.  All other systems reviewed and are negative.    Physical Exam Updated Vital Signs BP (!) 133/51 (BP Location: Right Arm)   Pulse (!) 120   Temp 98.4 F (36.9 C) (Oral)   Resp 20   SpO2 95%   Physical Exam  Constitutional: She is oriented to person, place, and time. She appears well-developed and well-nourished. No distress.  HENT:  Mouth/Throat: Uvula is midline, oropharynx is clear and moist and mucous membranes are normal. No oropharyngeal exudate.  Eyes: Conjunctivae and EOM are normal. Pupils are equal, round, and reactive to light.  Neck: Normal range of motion. Neck supple. No JVD present. Carotid bruit is not present.  Cardiovascular: Normal rate, regular rhythm, normal heart sounds and intact distal pulses.   Pulmonary/Chest: Effort normal and breath sounds normal. No stridor. No respiratory distress. She has no wheezes. She has no rales.  Breath sounds clear but diminished bilaterally  Abdominal: Soft. Bowel sounds are normal. She exhibits no fluid  wave and no mass. There is no tenderness. There is no rebound and no guarding.  Musculoskeletal: Normal range of motion. She exhibits edema (to level of knees bilaterally). She exhibits no deformity.  All compartments soft  Neurological: She is alert and oriented to person, place, and time. She displays normal reflexes.  Skin: Skin is warm and dry. Capillary refill takes less than 2 seconds. No lesion noted.  Psychiatric: She has a normal mood and affect.  Nursing note and vitals reviewed.    ED Treatments / Results   Vitals:   07/17/16 2347  BP: (!) 133/51  Pulse: 66  Resp: 12  Temp: 98.4 F (36.9 C)    DIAGNOSTIC STUDIES: Oxygen Saturation is 95% on  RA, adequate by my interpretation.    COORDINATION OF CARE: 12:34 AM-Discussed next steps with pt. Pt verbalized understanding and is agreeable with the plan. Will review labs and imaging, then return for exam.   Labs (all labs ordered are listed, but only abnormal results are displayed) Labs Reviewed  CBC WITH DIFFERENTIAL/PLATELET - Abnormal; Notable for the following:       Result Value   WBC 13.3 (*)    RBC 3.61 (*)    Hemoglobin 9.5 (*)    HCT 29.9 (*)    RDW 18.0 (*)    Neutro Abs 9.8 (*)    Monocytes Absolute 1.1 (*)    All other components within normal limits  URINALYSIS, ROUTINE W REFLEX MICROSCOPIC - Abnormal; Notable for the following:    APPearance HAZY (*)    Protein, ur 30 (*)    Leukocytes, UA SMALL (*)    Bacteria, UA MANY (*)    Squamous Epithelial / LPF 0-5 (*)    All other components within normal limits  HEPATIC FUNCTION PANEL - Abnormal; Notable for the following:    Total Protein 5.7 (*)    Albumin 2.2 (*)    Alkaline Phosphatase 433 (*)    All other components within normal limits  POTASSIUM - Abnormal; Notable for the following:    Potassium 2.2 (*)    All other components within normal limits  COMPREHENSIVE METABOLIC PANEL - Abnormal; Notable for the following:    Potassium 2.1 (*)     Chloride 114 (*)    CO2 18 (*)    Glucose, Bld 166 (*)    Creatinine, Ser 1.32 (*)    Total Protein 6.1 (*)    Albumin 2.5 (*)    ALT 11 (*)    Alkaline Phosphatase 478 (*)    GFR calc non Af Amer 37 (*)    GFR calc Af Amer 43 (*)    All other components within normal limits  I-STAT CHEM 8, ED - Abnormal; Notable for the following:    Potassium 2.2 (*)    Chloride 112 (*)    BUN 22 (*)    Creatinine, Ser 1.20 (*)    Glucose, Bld 175 (*)    Calcium, Ion 1.41 (*)    Hemoglobin 10.5 (*)    HCT 31.0 (*)    All other components within normal limits  MAGNESIUM  Randolm Idol, ED   Radiology Dg Chest 2 View  Result Date: 07/18/2016 CLINICAL DATA:  82 year old female with altered mental status and weakness. EXAM: CHEST  2 VIEW COMPARISON:  Chest radiograph dated 07/18/2015 FINDINGS: Evaluation is somewhat limited due to semi-erect position of the patient. There is no focal consolidation, pleural effusion, or pneumothorax. Stable cardiac silhouette. Left pectoral AICD device noted. Median sternotomy wires. There is osteopenia with degenerative changes of the spine. No acute osseous pathology. IMPRESSION: No active cardiopulmonary disease. Electronically Signed   By: Anner Crete [name]   On: 07/18/2016 01:00   Ct Head Wo Contrast  Result Date: 07/18/2016 CLINICAL DATA:  Altered mental status for 2 weeks. On hospice. History of hypertension, stroke, diabetes, ICD. EXAM: CT HEAD WITHOUT CONTRAST TECHNIQUE: Contiguous axial images were obtained from the base of the skull through the vertex without intravenous contrast. COMPARISON:  CT HEAD August 02, 2007 FINDINGS: BRAIN: No intraparenchymal hemorrhage, mass effect, midline shift or acute large vascular territory infarcts. Old RIGHT parietal lobe infarct with mild ex vacuo dilatation subjacent ventricle, generalized moderate to severe ventriculomegaly  on the basis of global parenchymal brain volume loss. Patchy supratentorial white matter  hypodensities compatible with moderate chronic small vessel ischemic disease, normal for age. No abnormal extra-axial fluid collections. Basal cisterns are patent. VASCULAR: Severe calcific atherosclerosis of the carotid siphons. SKULL: No skull fracture. No significant scalp soft tissue swelling. SINUSES/ORBITS: The mastoid air-cells and included paranasal sinuses are well-aerated.The included ocular globes and orbital contents are non-suspicious. Status post bilateral ocular lens implants. OTHER: Patient is edentulous. IMPRESSION: No acute intracranial process. 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 Moderate chronic small vessel ischemic disease. Moderate to severe atrophy. Electronically Signed   By: Elon Alas [name]   On: 07/18/2016 00:58    Procedures Procedures (including critical care time)  Medications Ordered in ED  Medications  potassium chloride 10 mEq in 100 mL IVPB (10 mEq Intravenous New Bag/Given 07/18/16 0150)  potassium chloride SA (K-DUR,KLOR-CON) CR tablet 40 mEq (40 mEq Oral Given 07/18/16 0149)  cefTRIAXone (ROCEPHIN) 1 g in dextrose 5 % 50 mL IVPB (1 g Intravenous New Bag/Given 07/18/16 0149)     Final Clinical Impressions(s) / ED Diagnoses  Hypokalemia/uti: Will admit to medicine    I personally performed the services described in this documentation, which was scribed in my presence. The recorded information has been reviewed and is accurate.      Neelam Tiggs, MD 07/18/16 1275

## 2016-07-18 ENCOUNTER — Encounter (HOSPITAL_COMMUNITY): Payer: Self-pay | Admitting: Emergency Medicine

## 2016-07-18 ENCOUNTER — Emergency Department (HOSPITAL_COMMUNITY)

## 2016-07-18 DIAGNOSIS — R4182 Altered mental status, unspecified: Secondary | ICD-10-CM | POA: Diagnosis not present

## 2016-07-18 DIAGNOSIS — R609 Edema, unspecified: Secondary | ICD-10-CM | POA: Diagnosis not present

## 2016-07-18 DIAGNOSIS — I5022 Chronic systolic (congestive) heart failure: Secondary | ICD-10-CM

## 2016-07-18 DIAGNOSIS — E876 Hypokalemia: Secondary | ICD-10-CM | POA: Diagnosis not present

## 2016-07-18 DIAGNOSIS — N179 Acute kidney failure, unspecified: Secondary | ICD-10-CM | POA: Diagnosis present

## 2016-07-18 DIAGNOSIS — Z7189 Other specified counseling: Secondary | ICD-10-CM | POA: Diagnosis not present

## 2016-07-18 DIAGNOSIS — I10 Essential (primary) hypertension: Secondary | ICD-10-CM | POA: Diagnosis not present

## 2016-07-18 DIAGNOSIS — Z7982 Long term (current) use of aspirin: Secondary | ICD-10-CM | POA: Diagnosis not present

## 2016-07-18 DIAGNOSIS — Z95810 Presence of automatic (implantable) cardiac defibrillator: Secondary | ICD-10-CM | POA: Diagnosis not present

## 2016-07-18 DIAGNOSIS — N390 Urinary tract infection, site not specified: Secondary | ICD-10-CM | POA: Diagnosis present

## 2016-07-18 DIAGNOSIS — I110 Hypertensive heart disease with heart failure: Secondary | ICD-10-CM | POA: Diagnosis present

## 2016-07-18 DIAGNOSIS — R627 Adult failure to thrive: Secondary | ICD-10-CM | POA: Diagnosis not present

## 2016-07-18 DIAGNOSIS — E119 Type 2 diabetes mellitus without complications: Secondary | ICD-10-CM | POA: Diagnosis not present

## 2016-07-18 DIAGNOSIS — J690 Pneumonitis due to inhalation of food and vomit: Secondary | ICD-10-CM | POA: Diagnosis not present

## 2016-07-18 DIAGNOSIS — N3000 Acute cystitis without hematuria: Secondary | ICD-10-CM | POA: Diagnosis not present

## 2016-07-18 DIAGNOSIS — C787 Secondary malignant neoplasm of liver and intrahepatic bile duct: Secondary | ICD-10-CM | POA: Diagnosis not present

## 2016-07-18 DIAGNOSIS — L8990 Pressure ulcer of unspecified site, unspecified stage: Secondary | ICD-10-CM | POA: Insufficient documentation

## 2016-07-18 DIAGNOSIS — Z515 Encounter for palliative care: Secondary | ICD-10-CM | POA: Diagnosis present

## 2016-07-18 DIAGNOSIS — G9341 Metabolic encephalopathy: Secondary | ICD-10-CM | POA: Diagnosis present

## 2016-07-18 DIAGNOSIS — E1159 Type 2 diabetes mellitus with other circulatory complications: Secondary | ICD-10-CM | POA: Diagnosis not present

## 2016-07-18 DIAGNOSIS — E860 Dehydration: Secondary | ICD-10-CM | POA: Diagnosis not present

## 2016-07-18 DIAGNOSIS — Z951 Presence of aortocoronary bypass graft: Secondary | ICD-10-CM | POA: Diagnosis not present

## 2016-07-18 DIAGNOSIS — C189 Malignant neoplasm of colon, unspecified: Secondary | ICD-10-CM | POA: Diagnosis not present

## 2016-07-18 DIAGNOSIS — I2510 Atherosclerotic heart disease of native coronary artery without angina pectoris: Secondary | ICD-10-CM | POA: Diagnosis not present

## 2016-07-18 DIAGNOSIS — K219 Gastro-esophageal reflux disease without esophagitis: Secondary | ICD-10-CM | POA: Diagnosis present

## 2016-07-18 DIAGNOSIS — E875 Hyperkalemia: Secondary | ICD-10-CM | POA: Diagnosis present

## 2016-07-18 DIAGNOSIS — Z79899 Other long term (current) drug therapy: Secondary | ICD-10-CM | POA: Diagnosis not present

## 2016-07-18 LAB — CBC WITH DIFFERENTIAL/PLATELET
BASOS ABS: 0 10*3/uL (ref 0.0–0.1)
Basophils Absolute: 0 10*3/uL (ref 0.0–0.1)
Basophils Relative: 0 %
Basophils Relative: 0 %
EOS ABS: 0.1 10*3/uL (ref 0.0–0.7)
EOS PCT: 1 %
Eosinophils Absolute: 0.1 10*3/uL (ref 0.0–0.7)
Eosinophils Relative: 1 %
HCT: 32.3 % — ABNORMAL LOW (ref 36.0–46.0)
HEMATOCRIT: 29.9 % — AB (ref 36.0–46.0)
HEMOGLOBIN: 10.2 g/dL — AB (ref 12.0–15.0)
Hemoglobin: 9.5 g/dL — ABNORMAL LOW (ref 12.0–15.0)
LYMPHS ABS: 2.3 10*3/uL (ref 0.7–4.0)
LYMPHS ABS: 2.6 10*3/uL (ref 0.7–4.0)
LYMPHS PCT: 18 %
Lymphocytes Relative: 17 %
MCH: 26.1 pg (ref 26.0–34.0)
MCH: 26.3 pg (ref 26.0–34.0)
MCHC: 31.6 g/dL (ref 30.0–36.0)
MCHC: 31.8 g/dL (ref 30.0–36.0)
MCV: 82.6 fL (ref 78.0–100.0)
MCV: 82.8 fL (ref 78.0–100.0)
MONO ABS: 1.1 10*3/uL — AB (ref 0.1–1.0)
MONOS PCT: 8 %
MONOS PCT: 8 %
Monocytes Absolute: 1.3 10*3/uL — ABNORMAL HIGH (ref 0.1–1.0)
NEUTROS ABS: 9.8 10*3/uL — AB (ref 1.7–7.7)
NEUTROS PCT: 74 %
Neutro Abs: 11.7 10*3/uL — ABNORMAL HIGH (ref 1.7–7.7)
Neutrophils Relative %: 73 %
Platelets: 379 10*3/uL (ref 150–400)
Platelets: 396 10*3/uL (ref 150–400)
RBC: 3.61 MIL/uL — ABNORMAL LOW (ref 3.87–5.11)
RBC: 3.91 MIL/uL (ref 3.87–5.11)
RDW: 18 % — AB (ref 11.5–15.5)
RDW: 18.1 % — ABNORMAL HIGH (ref 11.5–15.5)
WBC: 13.3 10*3/uL — ABNORMAL HIGH (ref 4.0–10.5)
WBC: 15.8 10*3/uL — ABNORMAL HIGH (ref 4.0–10.5)

## 2016-07-18 LAB — COMPREHENSIVE METABOLIC PANEL
ALBUMIN: 2.5 g/dL — AB (ref 3.5–5.0)
ALT: 11 U/L — ABNORMAL LOW (ref 14–54)
ALT: 12 U/L — AB (ref 14–54)
AST: 23 U/L (ref 15–41)
AST: 23 U/L (ref 15–41)
Albumin: 2.4 g/dL — ABNORMAL LOW (ref 3.5–5.0)
Alkaline Phosphatase: 478 U/L — ABNORMAL HIGH (ref 38–126)
Alkaline Phosphatase: 460 U/L — ABNORMAL HIGH (ref 38–126)
Anion gap: 7 (ref 5–15)
Anion gap: 9 (ref 5–15)
BUN: 20 mg/dL (ref 6–20)
BUN: 20 mg/dL (ref 6–20)
CHLORIDE: 113 mmol/L — AB (ref 101–111)
CHLORIDE: 114 mmol/L — AB (ref 101–111)
CO2: 18 mmol/L — ABNORMAL LOW (ref 22–32)
CO2: 17 mmol/L — ABNORMAL LOW (ref 22–32)
CREATININE: 1.24 mg/dL — AB (ref 0.44–1.00)
Calcium: 9.5 mg/dL (ref 8.9–10.3)
Calcium: 9.6 mg/dL (ref 8.9–10.3)
Creatinine, Ser: 1.32 mg/dL — ABNORMAL HIGH (ref 0.44–1.00)
GFR calc Af Amer: 43 mL/min — ABNORMAL LOW (ref >60)
GFR calc Af Amer: 46 mL/min — ABNORMAL LOW (ref >60)
GFR calc non Af Amer: 37 mL/min — ABNORMAL LOW (ref >60)
GFR calc non Af Amer: 40 mL/min — ABNORMAL LOW (ref >60)
GLUCOSE: 166 mg/dL — AB (ref 65–99)
Glucose, Bld: 143 mg/dL — ABNORMAL HIGH (ref 65–99)
POTASSIUM: 2.1 mmol/L — AB (ref 3.5–5.1)
Potassium: 2.5 mmol/L — CL (ref 3.5–5.1)
SODIUM: 139 mmol/L (ref 135–145)
SODIUM: 139 mmol/L (ref 135–145)
Total Bilirubin: 0.7 mg/dL (ref 0.3–1.2)
Total Bilirubin: 0.6 mg/dL (ref 0.3–1.2)
Total Protein: 6.1 g/dL — ABNORMAL LOW (ref 6.5–8.1)
Total Protein: 6.1 g/dL — ABNORMAL LOW (ref 6.5–8.1)

## 2016-07-18 LAB — URINALYSIS, ROUTINE W REFLEX MICROSCOPIC
Bilirubin Urine: NEGATIVE
Glucose, UA: NEGATIVE mg/dL
Hgb urine dipstick: NEGATIVE
KETONES UR: NEGATIVE mg/dL
Nitrite: NEGATIVE
PH: 5 (ref 5.0–8.0)
Protein, ur: 30 mg/dL — AB
Specific Gravity, Urine: 1.013 (ref 1.005–1.030)

## 2016-07-18 LAB — I-STAT CHEM 8, ED
BUN: 22 mg/dL — AB (ref 6–20)
CREATININE: 1.2 mg/dL — AB (ref 0.44–1.00)
Calcium, Ion: 1.41 mmol/L — ABNORMAL HIGH (ref 1.15–1.40)
Chloride: 112 mmol/L — ABNORMAL HIGH (ref 101–111)
Glucose, Bld: 175 mg/dL — ABNORMAL HIGH (ref 65–99)
HCT: 31 % — ABNORMAL LOW (ref 36.0–46.0)
Hemoglobin: 10.5 g/dL — ABNORMAL LOW (ref 12.0–15.0)
Potassium: 2.2 mmol/L — CL (ref 3.5–5.1)
Sodium: 142 mmol/L (ref 135–145)
TCO2: 20 mmol/L (ref 0–100)

## 2016-07-18 LAB — HEPATIC FUNCTION PANEL
ALT: 14 U/L (ref 14–54)
AST: 23 U/L (ref 15–41)
Albumin: 2.2 g/dL — ABNORMAL LOW (ref 3.5–5.0)
Alkaline Phosphatase: 433 U/L — ABNORMAL HIGH (ref 38–126)
BILIRUBIN INDIRECT: 0.4 mg/dL (ref 0.3–0.9)
Bilirubin, Direct: 0.2 mg/dL (ref 0.1–0.5)
TOTAL PROTEIN: 5.7 g/dL — AB (ref 6.5–8.1)
Total Bilirubin: 0.6 mg/dL (ref 0.3–1.2)

## 2016-07-18 LAB — I-STAT TROPONIN, ED: TROPONIN I, POC: 0.04 ng/mL (ref 0.00–0.08)

## 2016-07-18 LAB — GLUCOSE, CAPILLARY
GLUCOSE-CAPILLARY: 128 mg/dL — AB (ref 65–99)
Glucose-Capillary: 116 mg/dL — ABNORMAL HIGH (ref 65–99)
Glucose-Capillary: 148 mg/dL — ABNORMAL HIGH (ref 65–99)
Glucose-Capillary: 150 mg/dL — ABNORMAL HIGH (ref 65–99)

## 2016-07-18 LAB — POTASSIUM: POTASSIUM: 2.2 mmol/L — AB (ref 3.5–5.1)

## 2016-07-18 LAB — MAGNESIUM
Magnesium: 1.9 mg/dL (ref 1.7–2.4)
Magnesium: 1.9 mg/dL (ref 1.7–2.4)

## 2016-07-18 MED ORDER — POTASSIUM CHLORIDE CRYS ER 20 MEQ PO TBCR
40.0000 meq | EXTENDED_RELEASE_TABLET | Freq: Once | ORAL | Status: AC
  Administered 2016-07-18: 40 meq via ORAL
  Filled 2016-07-18: qty 2

## 2016-07-18 MED ORDER — ASPIRIN 325 MG PO TABS
325.0000 mg | ORAL_TABLET | Freq: Every day | ORAL | Status: DC
  Administered 2016-07-18: 325 mg via ORAL
  Filled 2016-07-18 (×2): qty 1

## 2016-07-18 MED ORDER — POTASSIUM CHLORIDE CRYS ER 20 MEQ PO TBCR
40.0000 meq | EXTENDED_RELEASE_TABLET | Freq: Two times a day (BID) | ORAL | Status: AC
  Administered 2016-07-18 (×2): 40 meq via ORAL
  Filled 2016-07-18 (×2): qty 2

## 2016-07-18 MED ORDER — METOPROLOL TARTRATE 25 MG PO TABS
50.0000 mg | ORAL_TABLET | Freq: Two times a day (BID) | ORAL | Status: DC
  Administered 2016-07-18 (×2): 50 mg via ORAL
  Filled 2016-07-18 (×3): qty 2

## 2016-07-18 MED ORDER — SODIUM CHLORIDE 0.9 % IV SOLN
INTRAVENOUS | Status: DC
  Administered 2016-07-18: 10:00:00 via INTRAVENOUS
  Filled 2016-07-18 (×2): qty 1000

## 2016-07-18 MED ORDER — POTASSIUM CHLORIDE 10 MEQ/100ML IV SOLN
10.0000 meq | INTRAVENOUS | Status: AC
  Administered 2016-07-18 (×6): 10 meq via INTRAVENOUS
  Filled 2016-07-18 (×6): qty 100

## 2016-07-18 MED ORDER — ACETAMINOPHEN 650 MG RE SUPP
650.0000 mg | Freq: Four times a day (QID) | RECTAL | Status: DC | PRN
Start: 2016-07-18 — End: 2016-07-21

## 2016-07-18 MED ORDER — INSULIN ASPART 100 UNIT/ML ~~LOC~~ SOLN
0.0000 [IU] | Freq: Three times a day (TID) | SUBCUTANEOUS | Status: DC
  Administered 2016-07-18 (×2): 1 [IU] via SUBCUTANEOUS
  Administered 2016-07-19: 2 [IU] via SUBCUTANEOUS
  Administered 2016-07-19: 1 [IU] via SUBCUTANEOUS
  Administered 2016-07-20: 2 [IU] via SUBCUTANEOUS
  Administered 2016-07-20 (×2): 1 [IU] via SUBCUTANEOUS

## 2016-07-18 MED ORDER — ORAL CARE MOUTH RINSE
15.0000 mL | Freq: Two times a day (BID) | OROMUCOSAL | Status: DC
  Administered 2016-07-18 – 2016-07-20 (×5): 15 mL via OROMUCOSAL

## 2016-07-18 MED ORDER — ACETAMINOPHEN 325 MG PO TABS: 650.0000 mg | ORAL_TABLET | Freq: Four times a day (QID) | ORAL | Status: DC | PRN

## 2016-07-18 MED ORDER — HYDROCODONE-ACETAMINOPHEN 5-325 MG PO TABS
0.5000 | ORAL_TABLET | Freq: Three times a day (TID) | ORAL | Status: DC | PRN
  Filled 2016-07-18: qty 1

## 2016-07-18 MED ORDER — NITROGLYCERIN 0.4 MG SL SUBL: 0.4000 mg | SUBLINGUAL_TABLET | SUBLINGUAL | Status: DC | PRN

## 2016-07-18 MED ORDER — DEXTROSE 5 % IV SOLN
1.0000 g | INTRAVENOUS | Status: DC
  Administered 2016-07-19: 1 g via INTRAVENOUS
  Filled 2016-07-18: qty 10

## 2016-07-18 MED ORDER — DOCUSATE SODIUM 100 MG PO CAPS: 100.0000 mg | ORAL_CAPSULE | Freq: Every day | ORAL | Status: DC | PRN

## 2016-07-18 MED ORDER — CEFTRIAXONE SODIUM 1 G IJ SOLR
1.0000 g | Freq: Once | INTRAMUSCULAR | Status: AC
  Administered 2016-07-18: 1 g via INTRAVENOUS
  Filled 2016-07-18: qty 10

## 2016-07-18 MED ORDER — POTASSIUM CHLORIDE IN NACL 20-0.9 MEQ/L-% IV SOLN
INTRAVENOUS | Status: DC
  Filled 2016-07-18: qty 1000

## 2016-07-18 MED ORDER — POTASSIUM CHLORIDE IN NACL 40-0.9 MEQ/L-% IV SOLN
INTRAVENOUS | Status: DC
  Administered 2016-07-18: 75 mL/h via INTRAVENOUS
  Filled 2016-07-18 (×2): qty 1000

## 2016-07-18 MED ORDER — PANTOPRAZOLE SODIUM 40 MG PO TBEC
40.0000 mg | DELAYED_RELEASE_TABLET | Freq: Every day | ORAL | Status: DC
  Administered 2016-07-18: 40 mg via ORAL
  Filled 2016-07-18: qty 1

## 2016-07-18 MED ORDER — CLONAZEPAM 0.5 MG PO TABS
0.5000 mg | ORAL_TABLET | Freq: Every evening | ORAL | Status: DC | PRN
  Administered 2016-07-19: 0.5 mg via ORAL
  Filled 2016-07-18: qty 1

## 2016-07-18 MED ORDER — ENOXAPARIN SODIUM 40 MG/0.4ML ~~LOC~~ SOLN
40.0000 mg | SUBCUTANEOUS | Status: DC
  Administered 2016-07-18 – 2016-07-20 (×3): 40 mg via SUBCUTANEOUS
  Filled 2016-07-18 (×3): qty 0.4

## 2016-07-18 NOTE — Progress Notes (Signed)
CRITICAL VALUE ALERT  Critical Value:  Potassium 2.5  Date & Time Notied:  07/18/16 0550  Provider Notified: Hal Hope  Orders Received/Actions taken: PO potassium ordered in addition to running potassium infusions

## 2016-07-18 NOTE — Progress Notes (Signed)
[facility] Liaison Note  This patient was seen by [facility] RN Pincus Large. Per Elizabeth's discussion with [facility] MD Alferd Patee, this is a related and covered admission as of 07/18/16 to Hospice diagnosis Colon Cancer. Patient was admitted from home to hospital with Failure to Thrive and hypokalemia. She was admitted to [facility] 07/14/2016. Per discussion with family today, their plan is for patient to return home with 24/7 family caregivers and support of [facility].   Family aware [facility] liaison will visit daily and anticipate discharge needs.   Thank you,  Erling Conte, LCSW [phone]

## 2016-07-18 NOTE — Progress Notes (Signed)
Palliative Medicine Team consult was received.   Ms. [name] [name] is an 83 year old female with metastatic colon cancer.  Chart reviewed and note that [facility] has been involved in her care with question for hospice referral noted on 5/24.  I called and spoke with [facility], and she Ms. [name] [name] recently come on service with them (in the last few days).  I also spoke with attending provider.  Ms. [name] [name] has recently enrolled in hospice, and it appears that goals are clear.  Will hold on consult at this time, however our team is always available to assist if there are specific areas where we can be of assistance in the care of Ms. [name] [name].  If there are specific needs where we can be of assistance, please call [phone] and we will be happy to engage at that time. Thank you for consulting out team to assist with this patients care.  NO CHARGE NOTE  Micheline Rough, MD [place] Team [phone]

## 2016-07-18 NOTE — Progress Notes (Signed)
[facility] PROGRESS NOTE    Progress Note  [name] [name]  HCW:[id number] DOB: [birth year] DOA: 07/17/2016 PCP: Colon Branch, MD     Brief Narrative:   [name] [name] is an 83 y.o. female past medical history of recently diagnosed metastatic colon cancer comes into for difficulty ambulating and poor oral intake. ER labs were done that showed acute renal failure hyperkalemia and possible UTI.  Assessment/Plan:   Acute kidney injury: Likely secondary to poor oral intake, I agree with holding Lasix continue IV fluids With a baseline creatinine around 1. BUN is slowly trending down.Check a basic metabolic panel in the morning.  Possible UTI: Has remained afebrile, leukocytosis continues to be high but she remains afebrile, question is do to metastatic disease. Agree with continuing IV Rocephin urine cultures are pending.  Hyperkalemia: Change IV fluids NS to 40 mEq, also give oral potassium  Metastatic colon cancer: Follow-up with Dr. Jonette Eva as an outpatient.  CAD status post CABG: Continue beta blockers and statins.  Diabetes mellitus type 2: Continue to hold oral hypoglycemic agents. Continue sliding scale insulin.  Chronic systolic heart failure: Status post AICD.  Chronic left leg: Be followed by Dr. Kermit Balo as an outpatient.  Generalized weakness: Patient is extremely weak family relates this has been going on for several weeks, but acutely decompensated over the last 2 days, which can probably be explained by her hypokalemia. Replete her potassium have physical therapy evaluate her. We'll consult Palliative Care. Goals of care.  Elevated alkaline phosphatase: Likely due to metastatic disease.  DVT prophylaxis: lovenox Family Communication:son Disposition Plan/Barrier to D/C: home in 2 days Code Status:     Code Status Orders        Start     Ordered   07/18/16 [phone]  Do not attempt resuscitation (DNR)  Continuous    Question Answer Comment  In the  event of cardiac or respiratory ARREST Do not call a "code blue"   In the event of cardiac or respiratory ARREST Do not perform Intubation, CPR, defibrillation or ACLS   In the event of cardiac or respiratory ARREST Use medication by any route, position, wound care, and other measures to relive pain and suffering. May use oxygen, suction and manual treatment of airway obstruction as needed for comfort.      07/18/16 0339    Code Status History    Date Active Date Inactive Code Status Order ID Comments User Context   12/19/2012  1:02 AM 12/21/2012  3:53 PM Full Code [id number]  Berle Mull, MD Inpatient   06/08/2012 10:13 AM 06/11/2012 10:22 PM Full Code [id number]  Newt Minion, MD Inpatient   04/11/2012  5:23 PM 04/13/2012  4:15 PM DNR [id number]  Charlynne Cousins, MD Inpatient        IV Access:    Peripheral IV   Procedures and diagnostic studies:   Dg Chest 2 View  Result Date: 07/18/2016 CLINICAL DATA:  83 year old female with altered mental status and weakness. EXAM: CHEST  2 VIEW COMPARISON:  Chest radiograph dated 07/18/2015 FINDINGS: Evaluation is somewhat limited due to semi-erect position of the patient. There is no focal consolidation, pleural effusion, or pneumothorax. Stable cardiac silhouette. Left pectoral AICD device noted. Median sternotomy wires. There is osteopenia with degenerative changes of the spine. No acute osseous pathology. IMPRESSION: No active cardiopulmonary disease. Electronically Signed   By: Anner Crete [name]   On: 07/18/2016 01:00   Ct Head Wo Contrast  Result Date: 07/18/2016  CLINICAL DATA:  Altered mental status for 2 weeks. On hospice. History of hypertension, stroke, diabetes, ICD. EXAM: CT HEAD WITHOUT CONTRAST TECHNIQUE: Contiguous axial images were obtained from the base of the skull through the vertex without intravenous contrast. COMPARISON:  CT HEAD August 02, 2007 FINDINGS: BRAIN: No intraparenchymal hemorrhage, mass effect, midline shift or  acute large vascular territory infarcts. Old RIGHT parietal lobe infarct with mild ex vacuo dilatation subjacent ventricle, generalized moderate to severe ventriculomegaly on the basis of global parenchymal brain volume loss. Patchy supratentorial white matter hypodensities compatible with moderate chronic small vessel ischemic disease, normal for age. No abnormal extra-axial fluid collections. Basal cisterns are patent. VASCULAR: Severe calcific atherosclerosis of the carotid siphons. SKULL: No skull fracture. No significant scalp soft tissue swelling. SINUSES/ORBITS: The mastoid air-cells and included paranasal sinuses are well-aerated.The included ocular globes and orbital contents are non-suspicious. Status post bilateral ocular lens implants. OTHER: Patient is edentulous. IMPRESSION: No acute intracranial process. 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 Moderate chronic small vessel ischemic disease. Moderate to severe atrophy. Electronically Signed   By: Elon Alas [name]   On: 07/18/2016 00:58     Medical Consultants:    None.  Anti-Infectives:   Rocephin  Subjective:    Orbie Pyo she has no new complains.  Objective:    Vitals:   07/17/16 2347 07/18/16 0417  BP: (!) 133/51 (!) 132/49  Pulse: 66 66  Resp: 12   Temp: 98.4 F (36.9 C) 98.4 F (36.9 C)  TempSrc: Oral Oral  SpO2: 98% 96%  Weight:  72.1 kg (158 lb 15.2 oz)  Height:  5\' 8"  (1.727 m)    Intake/Output Summary (Last 24 hours) at 07/18/16 0845 Last data filed at 07/18/16 0600  Gross per 24 hour  Intake              350 ml  Output                0 ml  Net              350 ml   Filed Weights   07/18/16 0417  Weight: 72.1 kg (158 lb 15.2 oz)    Exam: General exam:In no acute distress Respiratory system: Good air movement and clear to auscultation. Cardiovascular system: Regular rhythm with positive S1-S2 no murmurs rubs gallops Gastrointestinal system: Positive bowel sounds soft nontender  nondistended Central nervous system: Alert and oriented 3 Extremities: Trace edema Skin: No rashes or ulceration Psychiatry: Judgment and insight appear normal.   Data Reviewed:    Labs: Basic Metabolic Panel:  Recent Labs Lab 07/18/16 0021 07/18/16 0045 07/18/16 0503  NA 142 139 139  K 2.2* 2.1* 2.5*  CL 112* 114* 113*  CO2  --  18* 17*  GLUCOSE 175* 166* 143*  BUN 22* 20 20  CREATININE 1.20* 1.32* 1.24*  CALCIUM  --  9.5 9.6  MG  --  1.9 1.9   GFR Estimated Creatinine Clearance: 35.9 mL/min (A) (by C-G formula based on SCr of 1.24 mg/dL (H)). Liver Function Tests:  Recent Labs Lab 07/18/16 0009 07/18/16 0045 07/18/16 0503  AST 23 23 23   ALT 14 11* 12*  ALKPHOS 433* 478* 460*  BILITOT 0.6 0.7 0.6  PROT 5.7* 6.1* 6.1*  ALBUMIN 2.2* 2.5* 2.4*   No results for input(s): LIPASE, AMYLASE in the last 168 hours. No results for input(s): AMMONIA in the last 168 hours. Coagulation profile No results for input(s): INR, PROTIME in the  last 168 hours.  CBC:  Recent Labs Lab 07/18/16 0009 07/18/16 0021 07/18/16 0503  WBC 13.3*  --  15.8*  NEUTROABS 9.8*  --  11.7*  HGB 9.5* 10.5* 10.2*  HCT 29.9* 31.0* 32.3*  MCV 82.8  --  82.6  PLT 379  --  396   Cardiac Enzymes: No results for input(s): CKTOTAL, CKMB, CKMBINDEX, TROPONINI in the last 168 hours. BNP (last 3 results) No results for input(s): PROBNP in the last 8760 hours. CBG:  Recent Labs Lab 07/18/16 0737  GLUCAP 128*   D-Dimer: No results for input(s): DDIMER in the last 72 hours. Hgb A1c: No results for input(s): HGBA1C in the last 72 hours. Lipid Profile: No results for input(s): CHOL, HDL, LDLCALC, TRIG, CHOLHDL, LDLDIRECT in the last 72 hours. Thyroid function studies: No results for input(s): TSH, T4TOTAL, T3FREE, THYROIDAB in the last 72 hours.  Invalid input(s): FREET3 Anemia work up: No results for input(s): VITAMINB12, FOLATE, FERRITIN, TIBC, IRON, RETICCTPCT in the last 72  hours. Sepsis Labs:  Recent Labs Lab 07/18/16 0009 07/18/16 0503  WBC 13.3* 15.8*   Microbiology No results found for this or any previous visit (from the past 240 hour(s)).   Medications:   . aspirin  325 mg Oral QHS  . enoxaparin (LOVENOX) injection  40 mg Subcutaneous Q24H  . insulin aspart  0-9 Units Subcutaneous TID WC  . mouth rinse  15 mL Mouth Rinse BID  . metoprolol tartrate  50 mg Oral BID  . pantoprazole  40 mg Oral Daily   Continuous Infusions: . 0.9 % NaCl with KCl 20 mEq / L      Time spent: 25 min   LOS: 0 days   Charlynne Cousins  [facility] Pager [phone]  *Please refer to [email or web address], password [email or web address] to get updated schedule on who will round on this patient, as hospitalists switch teams weekly. If 7PM-7AM, please contact night-coverage at [email or web address], password [email or web address] for any overnight needs.  07/18/2016, 8:45 AM

## 2016-07-18 NOTE — H&P (Addendum)
History and Physical    [name] [name] VZD:[id number] DOB: Jun 06, 1932 DOA: 07/17/2016  PCP: Colon Branch, MD  Patient coming from: Home.  Chief Complaint: Weakness.  HPI: [name] [name] is a 83 y.o. female with history of recently diagnosed metastatic colon cancer presents to the ER because of weakness difficulty to ambulate and poor oral intake. As per patient's daughter patient has been not doing well last 2 weeks with progressive weakness and generalized unwell. Patient has not been eating well but otherwise did not have any nausea vomiting or diarrhea. Denies any chest pain or productive cough or shortness of breath.   ED Course: In the ER labs revealed severe hypokalemia with acute renal failure and possible UTI. Patient was given fluid and potassium replacement with ceftriaxone for UTI and admitted for further observation. On my exam patient is moving all extremities but appears weak.  Review of Systems: As per HPI, rest all negative.   Past Medical History:  Diagnosis Date  . Abnormal CT scan, chest    last 06-08-2009, see report   . Anemia    iron def   . Barrett esophagus    EGD 08/2009, next 2013  . CAD (coronary artery disease)    s/p stent R side 9/08, re stenosis 1/09, s/p angioplasty 1/09 and a stent on 12/09; re-angioplasty 5/11  . Carotid arterial disease (HCC)    s/p stent R side 9-08, re stenosis 1-09, s/p angioplasty 1-09 and a stent on 12-09, last  angiogram 06-24-2009  . CHF (congestive heart failure) ([place])   . Collagen vascular disease ([place])   . Colon cancer metastasized to liver ([place]) 06/27/2016  . Diabetes mellitus   . Diverticulitis 12/08  . GERD (gastroesophageal reflux disease)    occ  . Hyperlipidemia   . Hypertension   . ICD (implantable cardiac defibrillator) in place     12-08-- [employer]   . 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 Stroke [facility])     Past Surgical History:  Procedure Laterality Date  . AMPUTATION Left 06/08/2012   Procedure:  AMPUTATION RAY;  Surgeon: Newt Minion, MD;  Location: [place];  Service: Orthopedics;  Laterality: Left;  Left Foot 1st Ray Amputation  . ARTERIAL BYPASS SURGRY     08/08/2000  LIMA to LAD, SVG to intermediate, SVG to OM2, SVG to distal RCA  . CARDIAC DEFIBRILLATOR PLACEMENT     [employer] jud[employer]  . COLONOSCOPY WITH PROPOFOL N/A 06/30/2016   Procedure: COLONOSCOPY WITH PROPOFOL;  Surgeon: Milus Banister, MD;  Location: [facility];  Service: Endoscopy;  Laterality: N/A;     reports that she has never smoked. She has never used smokeless tobacco. She reports that she does not drink alcohol or use drugs.  No Known Allergies  Family History  Problem Relation Age of Onset  . Breast cancer Mother 38  . Tuberculosis Father   . Colon cancer Neg Hx     Prior to Admission medications   Medication Sig Start Date End Date Taking? Authorizing Provider  acetaminophen (TYLENOL) 500 MG tablet Take 1,000 mg by mouth every 8 (eight) hours as needed for mild pain or moderate pain.    Yes [provider]  aspirin 325 MG tablet Take 325 mg by mouth at bedtime.    Yes [provider]  clonazePAM (KLONOPIN) 1 MG tablet Take 0.5 tablets (0.5 mg total) by mouth at bedtime as needed for anxiety. 07/11/16  Yes Colon Branch, MD  docusate sodium (COLACE) 100 MG capsule Take 100 mg by mouth daily as needed for mild constipation.   Yes [provider]  furosemide (LASIX) 20 MG tablet Take 1 tablet (20 mg total) by mouth daily. 01/21/16  Yes Josue Hector, MD  glimepiride (AMARYL) 1 MG tablet Take 1 tablet (1 mg total) by mouth daily with breakfast. 06/14/16  Yes Paz, Jacqulyn Bath E, MD  glucose blood test strip Check blood sugars twice daily. 01/31/14  Yes Paz, Alda Berthold, MD  HYDROcodone-acetaminophen (NORCO/VICODIN) 5-325 MG tablet 1/2 to 1 tablet every 8 hrs as needed for pain Patient taking differently: Take 0.5-1 tablets by mouth every 8 (eight) hours as needed for moderate pain or severe pain.  06/21/16   Yes Willia Craze, NP  metoprolol (LOPRESSOR) 50 MG tablet Take 1 tablet (50 mg total) by mouth 2 (two) times daily. 05/23/16  Yes Paz, Alda Berthold, MD  mupirocin ointment (BACTROBAN) 2 % Apply 1 application topically 2 (two) times daily. Patient taking differently: Apply 1 application topically 2 (two) times daily as needed (wound care).  03/03/16  Yes Suzan Slick, NP  nitroGLYCERIN (NITROSTAT) 0.4 MG SL tablet Place 1 tablet (0.4 mg total) under the tongue every 5 (five) minutes as needed for chest pain. Contact your doctor and/or seek medial attention right away if chest pain 09/30/15  Yes Paz, Alda Berthold, MD  pantoprazole (PROTONIX) 40 MG tablet Take 1 tablet (40 mg total) by mouth daily. Patient taking differently: Take 40 mg by mouth daily as needed (heartburn).  05/31/16  Yes Paz, Alda Berthold, MD  potassium chloride (K-DUR,KLOR-CON) 10 MEQ tablet Take 1 tablet (10 mEq total) by mouth daily. Patient taking differently: Take 10 mEq by mouth every morning.  07/05/16  Yes Paz, Alda Berthold, MD  atorvastatin (LIPITOR) 40 MG tablet Take 1 tablet (40 mg total) by mouth daily. Patient not taking: Reported on 07/18/2016 05/11/16   Colon Branch, MD  clopidogrel (PLAVIX) 75 MG tablet Take 1 tablet (75 mg total) by mouth daily. Patient not taking: Reported on 07/18/2016 04/19/16   Colon Branch, MD    Physical Exam: Vitals:   07/17/16 2347  BP: (!) 133/51  Pulse: 66  Resp: 12  Temp: 98.4 F (36.9 C)  TempSrc: Oral  SpO2: 98%      Constitutional: Moderately built and nourished. Vitals:   07/17/16 2347  BP: (!) 133/51  Pulse: 66  Resp: 12  Temp: 98.4 F (36.9 C)  TempSrc: Oral  SpO2: 98%   Eyes: Anicteric no pallor. ENMT: No discharge from the ears eyes nose and mouth. Neck: No mass felt. No neck rigidity. Respiratory: No rhonchi or crepitations. Cardiovascular: S1-S2 heard no murmurs appreciated. Abdomen: Soft nontender bowel sounds present. Musculoskeletal: Left lower extremity wound. Skin: Left lower  extremity wound. Neurologic: Alert and awake but appears weak oriented to place and person. Moves all extremities. Psychiatric: Appears normal.   Labs on Admission: I have personally reviewed following labs and imaging studies  CBC:  Recent Labs Lab 07/18/16 0009 07/18/16 0021  WBC 13.3*  --   NEUTROABS 9.8*  --   HGB 9.5* 10.5*  HCT 29.9* 31.0*  MCV 82.8  --   PLT 379  --    Basic Metabolic Panel:  Recent Labs Lab 07/18/16 0009 07/18/16 0021 07/18/16 0045  NA  --  142 139  K 2.2* 2.2* 2.1*  CL  --  112* 114*  CO2  --   --  18*  GLUCOSE  --  175* 166*  BUN  --  22* 20  CREATININE  --  1.20* 1.32*  CALCIUM  --   --  9.5  MG  --   --  1.9   GFR: CrCl cannot be calculated (Unknown ideal weight.). Liver Function Tests:  Recent Labs Lab 07/18/16 0009 07/18/16 0045  AST 23 23  ALT 14 11*  ALKPHOS 433* 478*  BILITOT 0.6 0.7  PROT 5.7* 6.1*  ALBUMIN 2.2* 2.5*   No results for input(s): LIPASE, AMYLASE in the last 168 hours. No results for input(s): AMMONIA in the last 168 hours. Coagulation Profile: No results for input(s): INR, PROTIME in the last 168 hours. Cardiac Enzymes: No results for input(s): CKTOTAL, CKMB, CKMBINDEX, TROPONINI in the last 168 hours. BNP (last 3 results) No results for input(s): PROBNP in the last 8760 hours. HbA1C: No results for input(s): HGBA1C in the last 72 hours. CBG: No results for input(s): GLUCAP in the last 168 hours. Lipid Profile: No results for input(s): CHOL, HDL, LDLCALC, TRIG, CHOLHDL, LDLDIRECT in the last 72 hours. Thyroid Function Tests: No results for input(s): TSH, T4TOTAL, FREET4, T3FREE, THYROIDAB in the last 72 hours. Anemia Panel: No results for input(s): VITAMINB12, FOLATE, FERRITIN, TIBC, IRON, RETICCTPCT in the last 72 hours. Urine analysis:    Component Value Date/Time   COLORURINE YELLOW 07/18/2016 0123   APPEARANCEUR HAZY (A) 07/18/2016 0123   LABSPEC 1.013 07/18/2016 0123   PHURINE 5.0  07/18/2016 0123   GLUCOSEU NEGATIVE 07/18/2016 0123   HGBUR NEGATIVE 07/18/2016 0123   BILIRUBINUR NEGATIVE 07/18/2016 0123   BILIRUBINUR Neg 12/04/2012 1644   KETONESUR NEGATIVE 07/18/2016 0123   PROTEINUR 30 (A) 07/18/2016 0123   UROBILINOGEN 0.2 12/18/2012 2208   NITRITE NEGATIVE 07/18/2016 0123   LEUKOCYTESUR SMALL (A) 07/18/2016 0123   Sepsis Labs: @LABRCNTIP (procalcitonin:4,lacticidven:4) )No results found for this or any previous visit (from the past 240 hour(s)).   Radiological Exams on Admission: Dg Chest 2 View  Result Date: 07/18/2016 CLINICAL DATA:  83 year old female with altered mental status and weakness. EXAM: CHEST  2 VIEW COMPARISON:  Chest radiograph dated 07/18/2015 FINDINGS: Evaluation is somewhat limited due to semi-erect position of the patient. There is no focal consolidation, pleural effusion, or pneumothorax. Stable cardiac silhouette. Left pectoral AICD device noted. Median sternotomy wires. There is osteopenia with degenerative changes of the spine. No acute osseous pathology. IMPRESSION: No active cardiopulmonary disease. Electronically Signed   By: Anner Crete [name]   On: 07/18/2016 01:00   Ct Head Wo Contrast  Result Date: 07/18/2016 CLINICAL DATA:  Altered mental status for 2 weeks. On hospice. History of hypertension, stroke, diabetes, ICD. EXAM: CT HEAD WITHOUT CONTRAST TECHNIQUE: Contiguous axial images were obtained from the base of the skull through the vertex without intravenous contrast. COMPARISON:  CT HEAD August 02, 2007 FINDINGS: BRAIN: No intraparenchymal hemorrhage, mass effect, midline shift or acute large vascular territory infarcts. Old RIGHT parietal lobe infarct with mild ex vacuo dilatation subjacent ventricle, generalized moderate to severe ventriculomegaly on the basis of global parenchymal brain volume loss. Patchy supratentorial white matter hypodensities compatible with moderate chronic small vessel ischemic disease, normal for age. No  abnormal extra-axial fluid collections. Basal cisterns are patent. VASCULAR: Severe calcific atherosclerosis of the carotid siphons. SKULL: No skull fracture. No significant scalp soft tissue swelling. SINUSES/ORBITS: The mastoid air-cells and included paranasal sinuses are well-aerated.The included ocular globes and orbital contents are non-suspicious. Status post bilateral ocular lens implants. OTHER: Patient is edentulous.  IMPRESSION: No acute intracranial process. 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 Moderate chronic small vessel ischemic disease. Moderate to severe atrophy. Electronically Signed   By: Elon Alas [name]   On: 07/18/2016 00:58      Assessment/Plan Principal Problem:   Dehydration Active Problems:   HTN (hypertension)   Chronic systolic heart failure (HCC)   Implantable cardioverter-defibrillator (ICD) in situ   Coronary artery disease   Colon cancer metastasized to liver (HCC)   Hypokalemia   Type 2 diabetes mellitus with vascular disease ([place])    1. Generalized weakness and failure to thrive probably from dehydration - patient is dehydrated and appears fatigued probably from poor oral intake probably from the recently diagnosed metastatic colon cancer. At this time we will gently hydrate and get palliative care consult. Discussed with patient's oncologist in a.m. Since patient has difficulty ambulating may have to get CT of the spine. If patient's creatinine improves may get CT of the spine with contrast otherwise may need without contrast. 2. Acute renal failure with hypokalemia and non-anion gap metabolic acidosis - secondary to poor oral intake. Hold Lasix for now and gently hydrate and replace potassium and recheck. Check magnesium levels. 3. Metastatic colon cancer - please notify Dr. Marin Olp, patient's oncologist. 4. Possible UTI -patient is placed on ceftriaxone. Follow urine culture.  5. CAD status post CABG - denies any chest pain. On antiplatelet agents  beta blocker and statins. 6. Diabetes mellitus type 2 - will hold off oral hypoglycemics and keep patient on sliding scale coverage. 7. Chronic systolic heart failure - status post ICD placement. Appears dehydrated so placing patient on fluids. 8. Chronic left leg wound being followed by Dr. Sharol Given.   DVT prophylaxis: Lovenox. Code Status: DO NOT RESUSCITATE.  Family Communication: Patient's daughters.  Disposition Plan: To be determined.  Consults called: Palliative care.  Admission status: Observation.    Rise Patience MD [facility] Pager [phone].  If 7PM-7AM, please contact night-coverage [email or web address] Password [email or web address]  07/18/2016, 3:40 AM

## 2016-07-18 NOTE — Care Management Obs Status (Signed)
[place] NOTIFICATION   Patient Details  Name: [name] [name] MRN: [id number] Date of Birth: 05-Apr-1932   Medicare Observation Status Notification Given:  Yes    MahabirJuliann Pulse, RN 07/18/2016, 12:37 PM

## 2016-07-18 NOTE — ED Notes (Signed)
Abnormal labs result MD Palumbo have been made aware

## 2016-07-18 NOTE — Progress Notes (Addendum)
Pharmacy Antibiotic Note  [name] [name] is a 84 y.o. female admitted on 07/17/2016 with possible UTI. Pharmacy has been consulted for Ceftriaxone dosing. First dose ordered in the ED.   Plan: Ceftriaxone 1g IV q24h.  As, Ceftriaxone does not require renal or hepatic adjustment, pharmacy will sign off at this time. Please re-consult as needed.   Height: 5\' 8"  (172.7 cm) Weight: 158 lb 15.2 oz (72.1 kg) IBW/kg (Calculated) : 63.9  Temp (24hrs), Avg:98.4 F (36.9 C), Min:98.4 F (36.9 C), Max:98.4 F (36.9 C)   Recent Labs Lab 07/18/16 0009 07/18/16 0021 07/18/16 0045 07/18/16 0503  WBC 13.3*  --   --  15.8*  CREATININE  --  1.20* 1.32* 1.24*    Estimated Creatinine Clearance: 35.9 mL/min (A) (by C-G formula based on SCr of 1.24 mg/dL (H)).    No Known Allergies  Antimicrobials this admission: 5/28 >> Ceftriaxone >>  Dose adjustments this admission: --  Microbiology results: 5/28 UA: many bacteria, small leukocytes, nitrite negative, WBC 6-30 (urine culture not ordered-have notified attending provider)  Thank you for allowing pharmacy to be a part of this patient's care.   Lindell Spar, PharmD, BCPS Pager: [phone] 07/18/2016 9:20 AM

## 2016-07-18 NOTE — Care Management Note (Signed)
Case Management Note  Patient Details  Name: [name] [name] MRN: [id number] Date of Birth: 1932/04/03  Subjective/Objective: 83 y/o f admitted w/dehydration. From home w/family, has rw. PT cons-await recc. CSW also following.Likely need SNF.                   Action/Plan:d/c SNF.   Expected Discharge Date:                  Expected Discharge Plan:  Skilled Nursing Facility  In-House Referral:  Clinical Social Work  Discharge planning Services  CM Consult  Post Acute Care Choice:    Choice offered to:     DME Arranged:    DME Agency:     HH Arranged:    [place] Agency:     Status of Service:  In process, will continue to follow  If discussed at Long Length of Stay Meetings, dates discussed:    Additional Comments:  Dessa Phi, RN 07/18/2016, 12:38 PM

## 2016-07-19 DIAGNOSIS — J690 Pneumonitis due to inhalation of food and vomit: Secondary | ICD-10-CM

## 2016-07-19 LAB — BASIC METABOLIC PANEL
Anion gap: 6 (ref 5–15)
BUN: 19 mg/dL (ref 6–20)
CALCIUM: 11.1 mg/dL — AB (ref 8.9–10.3)
CO2: 15 mmol/L — AB (ref 22–32)
CREATININE: 1.17 mg/dL — AB (ref 0.44–1.00)
Chloride: 121 mmol/L — ABNORMAL HIGH (ref 101–111)
GFR calc Af Amer: 49 mL/min — ABNORMAL LOW (ref >60)
GFR calc non Af Amer: 43 mL/min — ABNORMAL LOW (ref >60)
GLUCOSE: 180 mg/dL — AB (ref 65–99)
Potassium: 4 mmol/L (ref 3.5–5.1)
Sodium: 142 mmol/L (ref 135–145)

## 2016-07-19 LAB — GLUCOSE, CAPILLARY
GLUCOSE-CAPILLARY: 150 mg/dL — AB (ref 65–99)
GLUCOSE-CAPILLARY: 170 mg/dL — AB (ref 65–99)
GLUCOSE-CAPILLARY: 112 mg/dL — AB (ref 65–99)
GLUCOSE-CAPILLARY: 130 mg/dL — AB (ref 65–99)

## 2016-07-19 MED ORDER — SODIUM CHLORIDE 0.45 % IV SOLN
INTRAVENOUS | Status: DC
  Administered 2016-07-19 – 2016-07-20 (×2): via INTRAVENOUS

## 2016-07-19 MED ORDER — AMPICILLIN-SULBACTAM SODIUM 1.5 (1-0.5) G IJ SOLR
1.5000 g | Freq: Four times a day (QID) | INTRAMUSCULAR | Status: DC
  Administered 2016-07-19 – 2016-07-21 (×8): 1.5 g via INTRAVENOUS
  Filled 2016-07-19 (×10): qty 1.5

## 2016-07-19 MED ORDER — MORPHINE SULFATE (PF) 4 MG/ML IV SOLN
2.0000 mg | Freq: Once | INTRAVENOUS | Status: AC
  Administered 2016-07-19: 2 mg via INTRAVENOUS
  Filled 2016-07-19: qty 1

## 2016-07-19 NOTE — Progress Notes (Signed)
[facility] PROGRESS NOTE    Progress Note  [name] [name]  QZR:[id number] DOB: 1932-11-03 DOA: 07/17/2016 PCP: Colon Branch, MD     Brief Narrative:   [name] [name] is an 83 y.o. female past medical history of recently diagnosed metastatic colon cancer comes into for difficulty ambulating and poor oral intake. ER labs were done that showed acute renal failure hyperkalemia and possible UTI.  Assessment/Plan:   Acute kidney injury: Likely secondary to poor oral intake, I agree with holding Lasix continue IV fluids With a baseline creatinine around 1. BUN is slowly trending down.Check a basic metabolic panel in the morning.  Acute confusional state: Due to aspiration PNA. Change antibiotic coverage to Unasyn.  Hyperkalemia: Change IV fluids NS to 40 mEq, also give oral potassium  Metastatic colon cancer: Follow-up with Dr. Jonette Eva as an outpatient.  CAD status post CABG: Continue beta blockers and statins.  Diabetes mellitus type 2: Continue to hold oral hypoglycemic agents. Continue sliding scale insulin.  Chronic systolic heart failure: Status post AICD.  Chronic left leg: Be followed by Dr. Kermit Balo as an outpatient.  Generalized weakness: Patient is extremely weak family relates this has been going on for several weeks, but acutely decompensated over the last 2 days, which can probably be explained by her hypokalemia. Replete her potassium have physical therapy evaluate her. We'll consult Palliative Care. Goals of care.  Elevated alkaline phosphatase: Likely due to metastatic disease.  Ethics: Had a long discussion with 2 of her children and they were leaning towards towards hospice and comfort care. They will like to discuss it  with other siblings to have an answer by tomorrow so they can all be at the same page. [facility] is following and is trying to find a bed at [facility] placed.  DVT prophylaxis: lovenox Family  Communication:son Disposition Plan/Barrier to D/C: home in am Code Status:     Code Status Orders        Start     Ordered   07/18/16 [phone]  Do not attempt resuscitation (DNR)  Continuous    Question Answer Comment  In the event of cardiac or respiratory ARREST Do not call a "code blue"   In the event of cardiac or respiratory ARREST Do not perform Intubation, CPR, defibrillation or ACLS   In the event of cardiac or respiratory ARREST Use medication by any route, position, wound care, and other measures to relive pain and suffering. May use oxygen, suction and manual treatment of airway obstruction as needed for comfort.      07/18/16 0339    Code Status History    Date Active Date Inactive Code Status Order ID Comments User Context   12/19/2012  1:02 AM 12/21/2012  3:53 PM Full Code [id number]  Berle Mull, MD Inpatient   06/08/2012 10:13 AM 06/11/2012 10:22 PM Full Code [id number]  Newt Minion, MD Inpatient   04/11/2012  5:23 PM 04/13/2012  4:15 PM DNR [id number]  Charlynne Cousins, MD Inpatient        IV Access:    Peripheral IV   Procedures and diagnostic studies:   Dg Chest 2 View  Result Date: 07/18/2016 CLINICAL DATA:  83 year old female with altered mental status and weakness. EXAM: CHEST  2 VIEW COMPARISON:  Chest radiograph dated 07/18/2015 FINDINGS: Evaluation is somewhat limited due to semi-erect position of the patient. There is no focal consolidation, pleural effusion, or pneumothorax. Stable cardiac silhouette. Left pectoral AICD device noted. Median sternotomy  wires. There is osteopenia with degenerative changes of the spine. No acute osseous pathology. IMPRESSION: No active cardiopulmonary disease. Electronically Signed   By: Anner Crete [name]   On: 07/18/2016 01:00   Ct Head Wo Contrast  Result Date: 07/18/2016 CLINICAL DATA:  Altered mental status for 2 weeks. On hospice. History of hypertension, stroke, diabetes, ICD. EXAM: CT HEAD WITHOUT CONTRAST  TECHNIQUE: Contiguous axial images were obtained from the base of the skull through the vertex without intravenous contrast. COMPARISON:  CT HEAD August 02, 2007 FINDINGS: BRAIN: No intraparenchymal hemorrhage, mass effect, midline shift or acute large vascular territory infarcts. Old RIGHT parietal lobe infarct with mild ex vacuo dilatation subjacent ventricle, generalized moderate to severe ventriculomegaly on the basis of global parenchymal brain volume loss. Patchy supratentorial white matter hypodensities compatible with moderate chronic small vessel ischemic disease, normal for age. No abnormal extra-axial fluid collections. Basal cisterns are patent. VASCULAR: Severe calcific atherosclerosis of the carotid siphons. SKULL: No skull fracture. No significant scalp soft tissue swelling. SINUSES/ORBITS: The mastoid air-cells and included paranasal sinuses are well-aerated.The included ocular globes and orbital contents are non-suspicious. Status post bilateral ocular lens implants. OTHER: Patient is edentulous. IMPRESSION: No acute intracranial process. 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 Moderate chronic small vessel ischemic disease. Moderate to severe atrophy. Electronically Signed   By: Elon Alas [name]   On: 07/18/2016 00:58     Medical Consultants:    None.  Anti-Infectives:   Unasyn  Subjective:    [name] [name] obtunded   Objective:    Vitals:   07/18/16 0900 07/18/16 1300 07/18/16 2035 07/19/16 0523  BP: (!) 117/40 (!) 130/49 (!) 145/84 (!) 149/49  Pulse: 73 75 69 62  Resp:  16 16 18   Temp:  98.2 F (36.8 C) 97.4 F (36.3 C) 97.9 F (36.6 C)  TempSrc:  Oral Oral Oral  SpO2:  95% 98% 100%  Weight:      Height:        Intake/Output Summary (Last 24 hours) at 07/19/16 0951 Last data filed at 07/19/16 0200  Gross per 24 hour  Intake          1033.75 ml  Output                0 ml  Net          1033.75 ml   Filed Weights   07/18/16 0417  Weight: 72.1 kg  (158 lb 15.2 oz)    Exam: General exam: On February obtunded  Respiratory system: Good air movement ancrackles on the right Respiratory system: Regular rhythm with positive S1-S2 no murmurs rubs gallops Gastrointestinal system: Positive bowel sounds soft nontender nondistended Central nervous system: Alert and oriented 3 Extremities: Trace edema Skin: No rashes or ulceration    Data Reviewed:    Labs: Basic Metabolic Panel:  Recent Labs Lab 07/18/16 0021 07/18/16 0045 07/18/16 0503 07/19/16 0839  NA 142 139 139 142  K 2.2* 2.1* 2.5* 4.0  CL 112* 114* 113* 121*  CO2  --  18* 17* 15*  GLUCOSE 175* 166* 143* 180*  BUN 22* 20 20 19   CREATININE 1.20* 1.32* 1.24* 1.17*  CALCIUM  --  9.5 9.6 11.1*  MG  --  1.9 1.9  --    GFR Estimated Creatinine Clearance: 38 mL/min (A) (by C-G formula based on SCr of 1.17 mg/dL (H)). Liver Function Tests:  Recent Labs Lab 07/18/16 0009 07/18/16 0045 07/18/16 0503  AST 23 23 23  ALT 14 11* 12*  ALKPHOS 433* 478* 460*  BILITOT 0.6 0.7 0.6  PROT 5.7* 6.1* 6.1*  ALBUMIN 2.2* 2.5* 2.4*   No results for input(s): LIPASE, AMYLASE in the last 168 hours. No results for input(s): AMMONIA in the last 168 hours. Coagulation profile No results for input(s): INR, PROTIME in the last 168 hours.  CBC:  Recent Labs Lab 07/18/16 0009 07/18/16 0021 07/18/16 0503  WBC 13.3*  --  15.8*  NEUTROABS 9.8*  --  11.7*  HGB 9.5* 10.5* 10.2*  HCT 29.9* 31.0* 32.3*  MCV 82.8  --  82.6  PLT 379  --  396   Cardiac Enzymes: No results for input(s): CKTOTAL, CKMB, CKMBINDEX, TROPONINI in the last 168 hours. BNP (last 3 results) No results for input(s): PROBNP in the last 8760 hours. CBG:  Recent Labs Lab 07/18/16 0737 07/18/16 1154 07/18/16 1636 07/18/16 2029 07/19/16 0748  GLUCAP 128* 116* 148* 150* 150*   D-Dimer: No results for input(s): DDIMER in the last 72 hours. Hgb A1c: No results for input(s): HGBA1C in the last 72  hours. Lipid Profile: No results for input(s): CHOL, HDL, LDLCALC, TRIG, CHOLHDL, LDLDIRECT in the last 72 hours. Thyroid function studies: No results for input(s): TSH, T4TOTAL, T3FREE, THYROIDAB in the last 72 hours.  Invalid input(s): FREET3 Anemia work up: No results for input(s): VITAMINB12, FOLATE, FERRITIN, TIBC, IRON, RETICCTPCT in the last 72 hours. Sepsis Labs:  Recent Labs Lab 07/18/16 0009 07/18/16 0503  WBC 13.3* 15.8*   Microbiology No results found for this or any previous visit (from the past 240 hour(s)).   Medications:   . aspirin  325 mg Oral QHS  . enoxaparin (LOVENOX) injection  40 mg Subcutaneous Q24H  . insulin aspart  0-9 Units Subcutaneous TID WC  . mouth rinse  15 mL Mouth Rinse BID  . metoprolol tartrate  50 mg Oral BID  . pantoprazole  40 mg Oral Daily   Continuous Infusions: . sodium chloride    . cefTRIAXone (ROCEPHIN) IVPB 1 gram/50 mL D5W Stopped (07/19/16 0130)    Time spent: 25 min   LOS: 1 day   Charlynne Cousins  [facility] Pager [phone]  *Please refer to [email or web address], password [email or web address] to get updated schedule on who will round on this patient, as hospitalists switch teams weekly. If 7PM-7AM, please contact night-coverage at [email or web address], password [email or web address] for any overnight needs.  07/19/2016, 9:51 AM

## 2016-07-19 NOTE — Progress Notes (Signed)
Nutrition Brief Note  Chart reviewed. Pt with possible discharge to [facility]. [facility] following. No nutrition interventions warranted at this time.  Please consult as needed.   Clayton Bibles, MS, RD, LDN Pager: [phone] After Hours Pager: [phone]

## 2016-07-19 NOTE — Progress Notes (Signed)
Discharge planning, [facility] with patient and family to discuss d/c needs. Plan is to return home with hospice service and caregivers provided by family. Will continue to follow for d/c needs. [phone]

## 2016-07-19 NOTE — Progress Notes (Signed)
PT Cancellation Note  Patient Details Name: [name] [name] MRN: [id number] DOB: Jun 24, 1932   Cancelled Treatment:    Reason Eval/Treat Not Completed: PT screened, no needs identified, will sign off.  Per MD, cancel order.  Likely d/c to [facility].  Please reorder if new needs arise.  Thanks.   Alline Pio,KATHrine E 07/19/2016, 9:44 AM Carmelia Bake, PT, DPT 07/19/2016 Pager: [phone]

## 2016-07-19 NOTE — Progress Notes (Signed)
Home Care SW and RN came to see Patient.  She was asleep during visit.  Daughter and Son were present.  They discussed [place] and were going to talk to their other siblings about what they want to do.  SW completed financial assessment for [employer] and provided support during visit.  Cecilio Asper, [place] [facility] [place]

## 2016-07-19 NOTE — Progress Notes (Addendum)
  Speech Language Pathology   Patient Details Name: [name] [name] MRN: [id number] DOB: [birth year] Today's Date: 07/19/2016 Time: 76-     Pt's son present and spoke with him re: current status who stated he doesn't think she is "going to take anything and maybe beyond eating or drinking much at this point." Pt fell asleep an hour ago. Son politely declined evaluation. Educated pt re: precautions if she should desire any po's (sit upright, adequate alertness and small sips). Likely transferring to hospice.               Houston Siren 07/19/2016, 2:34 PM   Orbie Pyo Colvin Caroli.Ed [employer] [phone]

## 2016-07-19 NOTE — Progress Notes (Signed)
[facility] 3329- [facility]IP RN Visit @ 9 AM  This is a related, covered GIP admission from 07/18/16 to [facility] diagnosis of colon cancer.  Patient is a DNR.  Patient was admitted from home with failure to thrive, dehydration and hypokalemia.  She was recently admitted to hospice homecare services on 07/14/16.  Patient seen in room with daughter, Karna Christmas and son, Hilliard Clark at bedside.  Patient appears comfortable.  She does not arouse to verbal stimuli.  She is on RA with respirations WNL.  Per discussion with children, patient was declined over the past 24 hours.  She has not had any po intake, since only eating a few bites around lunch time yesterday.  She is now not speaking or able to arouse.  Per chart review, patient was given 0.5 mg of Klonopin last night for anxiety.  Patient is receiving 1 gm Rocephin Q 24 hours IV.  She is receiving 0.45 NaCl IVF at 50 ml/hour via a PIV.   Family discussed concern in patient's decline and verbalized potential desire for [facility] for EOL care.  Terri and Hilliard Clark would like to discuss with the other children before making a decision. Terri, who has been the primary caregiver, voiced concern in taking her home, given the higher acuity of care.  She stated that until a couple weeks ago, patient was able to ambulate with the assistance of a walker.  Family reflected over their mother's positive personality and her great love as a mother. Phone call placed to [facility] chaplain to offer spiritual support.  Plan at this time is for family to discuss further potential for placement at [facility] verses taking patient back home with the support of hospice.  Offered emotional support and left contact information if any needs arise.  Updated [facility] social worker, Karren Burly, who is planning on visiting family shortly.  Updated [facility] medication list and transfer summary placed on chart.  [facility] will continue to follow daily and assist in discharge planning.  Thank  you, Freddi Starr, RN, [facility] Liaison [phone]

## 2016-07-20 ENCOUNTER — Telehealth: Payer: Self-pay | Admitting: *Deleted

## 2016-07-20 DIAGNOSIS — N179 Acute kidney failure, unspecified: Principal | ICD-10-CM

## 2016-07-20 DIAGNOSIS — C189 Malignant neoplasm of colon, unspecified: Secondary | ICD-10-CM

## 2016-07-20 DIAGNOSIS — E876 Hypokalemia: Secondary | ICD-10-CM

## 2016-07-20 DIAGNOSIS — C787 Secondary malignant neoplasm of liver and intrahepatic bile duct: Secondary | ICD-10-CM

## 2016-07-20 DIAGNOSIS — E860 Dehydration: Secondary | ICD-10-CM

## 2016-07-20 DIAGNOSIS — Z7189 Other specified counseling: Secondary | ICD-10-CM

## 2016-07-20 DIAGNOSIS — E1159 Type 2 diabetes mellitus with other circulatory complications: Secondary | ICD-10-CM

## 2016-07-20 LAB — BASIC METABOLIC PANEL
ANION GAP: 7 (ref 5–15)
BUN: 20 mg/dL (ref 6–20)
CHLORIDE: 126 mmol/L — AB (ref 101–111)
CO2: 12 mmol/L — AB (ref 22–32)
Calcium: 11.6 mg/dL — ABNORMAL HIGH (ref 8.9–10.3)
Creatinine, Ser: 1.23 mg/dL — ABNORMAL HIGH (ref 0.44–1.00)
GFR calc Af Amer: 46 mL/min — ABNORMAL LOW (ref >60)
GFR calc non Af Amer: 40 mL/min — ABNORMAL LOW (ref >60)
GLUCOSE: 165 mg/dL — AB (ref 65–99)
POTASSIUM: 2.9 mmol/L — AB (ref 3.5–5.1)
Sodium: 145 mmol/L (ref 135–145)

## 2016-07-20 LAB — URINE CULTURE

## 2016-07-20 LAB — GLUCOSE, CAPILLARY
GLUCOSE-CAPILLARY: 108 mg/dL — AB (ref 65–99)
Glucose-Capillary: 146 mg/dL — ABNORMAL HIGH (ref 65–99)
Glucose-Capillary: 154 mg/dL — ABNORMAL HIGH (ref 65–99)
Glucose-Capillary: 142 mg/dL — ABNORMAL HIGH (ref 65–99)

## 2016-07-20 MED ORDER — POTASSIUM CHLORIDE 10 MEQ/100ML IV SOLN
10.0000 meq | INTRAVENOUS | Status: AC
  Administered 2016-07-20 (×4): 10 meq via INTRAVENOUS
  Filled 2016-07-20 (×4): qty 100

## 2016-07-20 MED ORDER — FUROSEMIDE 10 MG/ML IJ SOLN
40.0000 mg | Freq: Once | INTRAMUSCULAR | Status: AC
  Administered 2016-07-20: 40 mg via INTRAVENOUS
  Filled 2016-07-20: qty 4

## 2016-07-20 NOTE — Progress Notes (Signed)
[facility] 1980- [facility] [place] RN Visit @ 9 AM  This is a related, covered GIP admission from 07/18/16 to [facility] diagnosis of colon cancer.  Patient is a DNR.  Patient was admitted from home with failure to thrive, dehydration and hypokalemia.  She was recently admitted to hospice homecare services on 07/14/16.  Patient seen in room with daughter, [name] at bedside.  Patient appears comfortable.  She does not arouse to verbal stimuli.  She is on RA with respirations WNL.  Patient has continued to decline overnight, and required NTS suctioning for secretions. Patient receiving Unasyn IV Q 6 hours for suspected aspiration pneumonia.  She received 4 doses of 71mq of Potassium IV.  Per discussion with staff RN, patient is no longer able to take any medication PO and has had no PO food intake in the past 24 hours.  Met with daughter, TVivien Rotato discuss goals of care.  [facility]  They would like their mother to be comfortable and optimize the time they are able to spend with her.  Unfortunately, at this time there is not a bed available, however, that could change later today or tomorrow.  Will update hospital team and family once bed becomes available, and confirm patient is stable to transfer at that time.  Please contact with any questions or concerns.  Thank you, SFreddi Starr RN, [facility] [phone]

## 2016-07-20 NOTE — Progress Notes (Signed)
CSW following for disposition/ DC planning. Plan for pt to DC to residential hospice at [facility]. Pt currently receives hospice services. CSW spoke with hospice liaison who states family has agreed on [employer] and liaison is awaiting bed to come available.  DNR in chart. Pt will require [facility] transportation.  Will continue following to assist with DC to [facility].   Sharren Bridge, MSW, LCSW Clinical Social Work 07/20/2016 [phone]

## 2016-07-20 NOTE — Care Management Note (Signed)
Case Management Note  Patient Details  Name: Lakesa Coste MRN: [id number] Date of Birth: 1932-08-25  Subjective/Objective:                    Action/Plan:Plan to discharge to [facility] today.   Expected Discharge Date:   07/20/2016               Expected Discharge Plan:  Home w Hospice Care  In-House Referral:  Clinical Social Work  Discharge planning Services  CM Consult  Post Acute Care Choice:  Hospice Choice offered to:  Patient, Adult Children  DME Arranged:  N/A DME Agency:  NA  HH Arranged:  Disease Management [place] Agency:  [facility] [place]  Status of Service:  Completed, signed off  If discussed at [place] of Stay Meetings, dates discussed:    Additional CommentsPurcell Mouton, RN 07/20/2016, 10:36 AM

## 2016-07-20 NOTE — Progress Notes (Signed)
RT nasal tracheal suctioned patient and obtained moderate yellow secretions. Patient tolerated well.

## 2016-07-20 NOTE — Telephone Encounter (Signed)
Received FMLA forms to be completed for patient's daughter, Darin Engels, to take care of her Mother, forwarded to provider/SLS 05/30

## 2016-07-20 NOTE — Progress Notes (Signed)
PROGRESS NOTE [facility]   [name] [name]   GQQ:[id number] DOB: 1932-09-03  DOA: 07/17/2016 PCP: Colon Branch, MD   Brief Narrative:  83 year old female with past medical history of recently diagnosed with metastatic colon cancer, and for difficulty ambulating and poor oral intake. The ED was found to have acute renal failure, hyperkalemia and UTI. Patient was admitted for further treatment, although she has not been very responsive to medical treatment, given her prognosis and advanced disease family decided to make the patient comfort and placed on hospice care facility.   Subjective: Patient seen and examined continues to be obtunded poorly responsive.  Assessment & Plan: Acute kidney injury likely secondary to to dehydration and poor oral intake Despite IV fluids and holding nephrotoxic medication creatinine continued to slowly trend up. Will continue gentle hydration for now and monitor for signs of fluid overload. Check BMP in AM   Acute metabolic encephalopathy - likely multifactorial progression of colon cancer, infection from pneumonia and UTI Not improving despite antibiotic treatment Continue antibiotics for now  Pneumonia/UTI - Enterococcus faecalis sensitive to ampicillin Pneumonia due to aspiration Continue Unasyn - upon discharge and DC on Augmentin for total of 5 days  Metastatic colon cancer Poor prognosis family have decided to take the patient to hospice and keep her comfortable Patient follow with Dr. Jonette Eva as outpatient   DM type 2  Holding oral hypoglycemic, given poor oral intake  Continue SSI Monitor CBG's   Hypokalemia Replete  Check K in AM   Goals of care Discussed with daughter poor prognosis. Family have decides to move forward with hospice and keep the patient comfortable although they would like to continue antibiotic treatment for infectious process. [facility] was contacted to find a bed become place which there is non  available at this time.   DVT prophylaxis: Lovenox Code Status: DO NOT RESUSCITATE Family Communication: Daughter at bedside Disposition Plan: Become place when bed available  Consultants:   None  Procedures:   None  Antimicrobials: Anti-infectives    Start     Dose/Rate Route Frequency Ordered Stop   07/19/16 1200  ampicillin-sulbactam (UNASYN) 1.5 g in sodium chloride 0.9 % 50 mL IVPB     1.5 g 100 mL/hr over 30 Minutes Intravenous Every 6 hours 07/19/16 1007     07/19/16 0200  cefTRIAXone (ROCEPHIN) 1 g in dextrose 5 % 50 mL IVPB  Status:  Discontinued     1 g 100 mL/hr over 30 Minutes Intravenous Every 24 hours 07/18/16 0915 07/19/16 1007   07/18/16 0145  cefTRIAXone (ROCEPHIN) 1 g in dextrose 5 % 50 mL IVPB     1 g 100 mL/hr over 30 Minutes Intravenous  Once 07/18/16 0139 07/18/16 0233        Objective: Vitals:   07/19/16 1300 07/19/16 2039 07/20/16 0519 07/20/16 1300  BP: (!) 126/41 (!) 143/53 (!) 127/48 (!) 124/46  Pulse: 66 72 78 87  Resp: 16 16 16 14   Temp: 97.4 F (36.3 C) 98.4 F (36.9 C) 98.2 F (36.8 C) 97.6 F (36.4 C)  TempSrc: Oral Oral Oral Oral  SpO2: 100% 100% 100% 100%  Weight:      Height:        Intake/Output Summary (Last 24 hours) at 07/20/16 1807 Last data filed at 07/20/16 1400  Gross per 24 hour  Intake          1559.16 ml  Output  0 ml  Net          1559.16 ml   Filed Weights   07/18/16 0417  Weight: 72.1 kg (158 lb 15.2 oz)    Examination:  General exam: Lethargic  Respiratory system: Good air entry, mild crackles at the RLL, no wheezing  Cardiovascular system: S1 & S2 heard, RRR. No JVD, murmurs,  Gastrointestinal system: Abdomen is nondistended, soft and nontender.  Central nervous system: Obtunded and unresponsive to commands  Extremities: Trace pedal edema  Skin: No rashes, lesions or ulcers  Data Reviewed: I have personally reviewed following labs and imaging studies  CBC:  Recent Labs Lab  07/18/16 0009 07/18/16 0021 07/18/16 0503  WBC 13.3*  --  15.8*  NEUTROABS 9.8*  --  11.7*  HGB 9.5* 10.5* 10.2*  HCT 29.9* 31.0* 32.3*  MCV 82.8  --  82.6  PLT 379  --  440   Basic Metabolic Panel:  Recent Labs Lab 07/18/16 0021 07/18/16 0045 07/18/16 0503 07/19/16 0839 07/20/16 0531  NA 142 139 139 142 145  K 2.2* 2.1* 2.5* 4.0 2.9*  CL 112* 114* 113* 121* 126*  CO2  --  18* 17* 15* 12*  GLUCOSE 175* 166* 143* 180* 165*  BUN 22* 20 20 19 20   CREATININE 1.20* 1.32* 1.24* 1.17* 1.23*  CALCIUM  --  9.5 9.6 11.1* 11.6*  MG  --  1.9 1.9  --   --    GFR: Estimated Creatinine Clearance: 36.2 mL/min (A) (by C-G formula based on SCr of 1.23 mg/dL (H)). Liver Function Tests:  Recent Labs Lab 07/18/16 0009 07/18/16 0045 07/18/16 0503  AST 23 23 23   ALT 14 11* 12*  ALKPHOS 433* 478* 460*  BILITOT 0.6 0.7 0.6  PROT 5.7* 6.1* 6.1*  ALBUMIN 2.2* 2.5* 2.4*   CBG:  Recent Labs Lab 07/19/16 1741 07/19/16 2133 07/20/16 0839 07/20/16 1204 07/20/16 1746  GLUCAP 112* 130* 146* 154* 142*   Recent Results (from the past 240 hour(s))  Culture, Urine     Status: Abnormal   Collection Time: 07/18/16  1:23 AM  Result Value Ref Range Status   Specimen Description URINE, CATHETERIZED  Final   Special Requests NONE  Final   Culture >=100,000 COLONIES/mL ENTEROCOCCUS FAECALIS (A)  Final   Report Status 07/20/2016 FINAL  Final   Organism ID, Bacteria ENTEROCOCCUS FAECALIS (A)  Final      Susceptibility   Enterococcus faecalis - MIC*    AMPICILLIN <=2 SENSITIVE Sensitive     LEVOFLOXACIN 0.5 SENSITIVE Sensitive     NITROFURANTOIN <=16 SENSITIVE Sensitive     VANCOMYCIN 1 SENSITIVE Sensitive     * >=100,000 COLONIES/mL ENTEROCOCCUS FAECALIS     Radiology Studies: No results found.  Scheduled Meds: . aspirin  325 mg Oral QHS  . enoxaparin (LOVENOX) injection  40 mg Subcutaneous Q24H  . insulin aspart  0-9 Units Subcutaneous TID WC  . mouth rinse  15 mL Mouth Rinse BID    . metoprolol tartrate  50 mg Oral BID  . pantoprazole  40 mg Oral Daily   Continuous Infusions: . sodium chloride 50 mL/hr at 07/20/16 1111  . ampicillin-sulbactam (UNASYN) IV Stopped (07/20/16 1146)     LOS: 2 days    Chipper Oman, MD Pager: Text Page via [email or web address]  [phone]  If 7PM-7AM, please contact night-coverage [email or web address] Password [email or web address] 07/20/2016, 6:07 PM

## 2016-07-20 NOTE — Progress Notes (Signed)
Patient had Morphine 2 mg for pain earlier.

## 2016-07-21 ENCOUNTER — Telehealth: Payer: Self-pay | Admitting: Internal Medicine

## 2016-07-21 DIAGNOSIS — R627 Adult failure to thrive: Secondary | ICD-10-CM

## 2016-07-21 LAB — BASIC METABOLIC PANEL
ANION GAP: 7 (ref 5–15)
BUN: 27 mg/dL — ABNORMAL HIGH (ref 6–20)
CHLORIDE: 126 mmol/L — AB (ref 101–111)
CO2: 14 mmol/L — AB (ref 22–32)
Calcium: 11.4 mg/dL — ABNORMAL HIGH (ref 8.9–10.3)
Creatinine, Ser: 1.48 mg/dL — ABNORMAL HIGH (ref 0.44–1.00)
GFR calc non Af Amer: 32 mL/min — ABNORMAL LOW (ref >60)
GFR, EST AFRICAN AMERICAN: 37 mL/min — AB (ref >60)
Glucose, Bld: 122 mg/dL — ABNORMAL HIGH (ref 65–99)
Potassium: 2.7 mmol/L — CL (ref 3.5–5.1)
Sodium: 147 mmol/L — ABNORMAL HIGH (ref 135–145)

## 2016-07-21 LAB — MAGNESIUM: MAGNESIUM: 2 mg/dL (ref 1.7–2.4)

## 2016-07-21 LAB — GLUCOSE, CAPILLARY
GLUCOSE-CAPILLARY: 107 mg/dL — AB (ref 65–99)
GLUCOSE-CAPILLARY: 111 mg/dL — AB (ref 65–99)

## 2016-07-21 MED ORDER — AMOXICILLIN-POT CLAVULANATE 875-125 MG PO TABS: 1.0000 | ORAL_TABLET | Freq: Two times a day (BID) | ORAL | 0 refills | Status: AC

## 2016-07-21 MED ORDER — MORPHINE SULFATE (CONCENTRATE) 10 MG /0.5 ML PO SOLN: 10.0000 mg | ORAL | 0 refills | Status: AC | PRN

## 2016-07-21 MED ORDER — POTASSIUM CHLORIDE 10 MEQ/100ML IV SOLN
10.0000 meq | INTRAVENOUS | Status: AC
  Administered 2016-07-21 (×3): 10 meq via INTRAVENOUS
  Filled 2016-07-21 (×3): qty 100

## 2016-07-21 MED ORDER — AMPICILLIN-SULBACTAM SODIUM 1.5 (1-0.5) G IJ SOLR
1.5000 g | Freq: Two times a day (BID) | INTRAMUSCULAR | Status: DC
  Filled 2016-07-21: qty 1.5

## 2016-07-21 NOTE — Progress Notes (Addendum)
CSW following to assist with DC to [facility] today. Received word [name] [name] has bed for pt today, CSW and Hospice LCSW spoke with pt's family and they are aware and agreeable. Updated MD on pt's bed at hospice home.   Will continue assisting with DC.  Sharren Bridge, MSW, LCSW Clinical Social Work 07/21/2016 [phone]  Received call back from pt's daughter [name] expressing family now unsure of wish to DC to [facility]. Request to speak with provider re: pt's prognosis.  Will follow.

## 2016-07-21 NOTE — Progress Notes (Signed)
PHARMACY NOTE:  ANTIMICROBIAL RENAL DOSAGE ADJUSTMENT  Current antimicrobial regimen includes a mismatch between antimicrobial dosage and estimated renal function.  As per policy approved by the Pharmacy & Therapeutics and Medical Executive Committees, the antimicrobial dosage will be adjusted accordingly.  Current antimicrobial dosage:  Unasyn 1.5 grams IV q6h  Indication: aspiration pneumonia and E.faecalis UTI  Renal Function:   SCr worsening, 1.48 today  Estimated Creatinine Clearance: 30.1 mL/min (A) (by C-G formula based on SCr of 1.48 mg/dL (H)). - suspect actual CrCl slightly lower since SCr is rising.     Antimicrobial dosage has been changed to:  Unasyn 1.5 grams IV q12h    Thank you for allowing pharmacy to be a part of this patient's care.  Clayburn Pert, PharmD, BCPS Pager: [phone] 07/21/2016  8:31 AM

## 2016-07-21 NOTE — Discharge Summary (Signed)
Physician Discharge Summary  [name] [name]  PPJ:[id number]  DOB: [birth year]  DOA: 07/17/2016 PCP: [name] Branch, MD  Admit date: 07/17/2016 Discharge date: 07/21/2016  Admitted From: Home  Disposition:  [facility]   Discharge Condition: Hospice - comfort care  CODE STATUS: DNR  Diet recommendation: Regular   Brief/Interim Summary: 83 year old female with past medical history of recently diagnosed with metastatic [name] cancer, and for difficulty ambulating and poor oral intake. The ED was found to have acute renal failure, hyperkalemia and UTI. Patient was admitted for further treatment, although she has not been very responsive to medical treatment, given her prognosis and advanced disease family decided to make the patient comfort and placed on hospice care facility  Subjective: Patient seen and examined the day of discharge. Patient continues to be obtunded/lethargic. Patient reported no pain. Per nursing staff patient not eating much. Labs continues to worsen.   Discharge Diagnoses/Hospital Course:  Acute kidney injury likely secondary to to dehydration and poor oral intake Despite IV fluids and holding nephrotoxic medication creatinine continued to slowly trending up, likely signs of multiorgan failure - patient is going for hospice/comfort care, no more lab drawn  Acute metabolic encephalopathy - likely multifactorial progression of [name] cancer, infection from pneumonia and UTI Not improving despite antibiotic treatment - If patient able to tolerate PO will continue Augmentin for completion purposes and family request.   Pneumonia/UTI - Enterococcus faecalis sensitive to ampicillin Pneumonia due to aspiration Morphine concentrate 10 mg PRN SOB or severe pain  See above   Metastatic [name] cancer Poor prognosis family have decided to take the patient to hospice and keep her comfortable Patient follow with Dr. Jonette [name] as outpatient   DM type 2 -  Continue  to hold oral hypoglycemic agents as poor oral intake  Patient for hospice/comfort care   Hypokalemia Repleted   Goal of care I discussed prognosis with daughter [name] [name], questions were answered. Patient with no improvement despite treatment, kidney function continues to deteriorates despite IV hydration. Patient not eating well, so poor caloric intake will make things worse. Patient with rapid declining, metastatic cancer with very poor prognosis. Hospice/comfort care recommended.   Discharge Instructions  You were cared for by a hospitalist during your hospital stay. If you have any questions about your discharge medications or the care you received while you were in the hospital after you are discharged, you can call the unit and asked to speak with the hospitalist on call if the hospitalist that took care of you is not available. Once you are discharged, your primary care physician will handle any further medical issues. Please note that NO REFILLS for any discharge medications will be authorized once you are discharged, as it is imperative that you return to your primary care physician (or establish a relationship with a primary care physician if you do not have one) for your aftercare needs so that they can reassess your need for medications and monitor your lab values.   Allergies as of 07/21/2016   No Known Allergies     Medication List    STOP taking these medications   aspirin 325 MG tablet   atorvastatin 40 MG tablet Commonly known as:  LIPITOR   clopidogrel 75 MG tablet Commonly known as:  PLAVIX   furosemide 20 MG tablet Commonly known as:  LASIX   glimepiride 1 MG tablet Commonly known as:  AMARYL   glucose blood test strip   HYDROcodone-acetaminophen 5-325 MG tablet Commonly  known as:  NORCO/VICODIN   metoprolol tartrate 50 MG tablet Commonly known as:  LOPRESSOR   mupirocin ointment 2 % Commonly known as:  BACTROBAN   nitroGLYCERIN 0.4 MG SL tablet Commonly  known as:  NITROSTAT   pantoprazole 40 MG tablet Commonly known as:  PROTONIX   potassium chloride 10 MEQ tablet Commonly known as:  K-DUR,KLOR-CON     TAKE these medications   acetaminophen 500 MG tablet Commonly known as:  TYLENOL Take 1,000 mg by mouth every 8 (eight) hours as needed for mild pain or moderate pain.   amoxicillin-clavulanate 875-125 MG tablet Commonly known as:  AUGMENTIN Take 1 tablet by mouth 2 (two) times daily.   clonazePAM 1 MG tablet Commonly known as:  KLONOPIN Take 0.5 tablets (0.5 mg total) by mouth at bedtime as needed for anxiety.   docusate sodium 100 MG capsule Commonly known as:  COLACE Take 100 mg by mouth daily as needed for mild constipation.   morphine CONCENTRATE 10 mg / 0.5 ml concentrated solution Take 0.5 mLs (10 mg total) by mouth every 2 (two) hours as needed for severe pain.       No Known Allergies  Consultations:  Palliative Care    Procedures/Studies: Dg Chest 2 View  Result Date: 07/18/2016 CLINICAL DATA:  83 year old female with altered mental status and weakness. EXAM: CHEST  2 VIEW COMPARISON:  Chest radiograph dated 07/18/2015 FINDINGS: Evaluation is somewhat limited due to semi-erect position of the patient. There is no focal consolidation, pleural effusion, or pneumothorax. Stable cardiac silhouette. Left pectoral AICD device noted. Median sternotomy wires. There is osteopenia with degenerative changes of the spine. No acute osseous pathology. IMPRESSION: No active cardiopulmonary disease. Electronically Signed   By: Anner Crete [name]   On: 07/18/2016 01:00   Ct Head Wo Contrast  Result Date: 07/18/2016 CLINICAL DATA:  Altered mental status for 2 weeks. On hospice. History of hypertension, stroke, diabetes, ICD. EXAM: CT HEAD WITHOUT CONTRAST TECHNIQUE: Contiguous axial images were obtained from the base of the skull through the vertex without intravenous contrast. COMPARISON:  CT HEAD August 02, 2007 FINDINGS:  BRAIN: No intraparenchymal hemorrhage, mass effect, midline shift or acute large vascular territory infarcts. Old RIGHT parietal lobe infarct with mild ex vacuo dilatation subjacent ventricle, generalized moderate to severe ventriculomegaly on the basis of global parenchymal brain volume loss. Patchy supratentorial white matter hypodensities compatible with moderate chronic small vessel ischemic disease, normal for age. No abnormal extra-axial fluid collections. Basal cisterns are patent. VASCULAR: Severe calcific atherosclerosis of the carotid siphons. SKULL: No skull fracture. No significant scalp soft tissue swelling. SINUSES/ORBITS: The mastoid air-cells and included paranasal sinuses are well-aerated.The included ocular globes and orbital contents are non-suspicious. Status post bilateral ocular lens implants. OTHER: Patient is edentulous. IMPRESSION: No acute intracranial process. 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 Moderate chronic small vessel ischemic disease. Moderate to severe atrophy. Electronically Signed   By: Elon Alas [name]   On: 07/18/2016 00:58     Discharge Exam: Vitals:   07/20/16 2030 07/21/16 0658  BP: (!) 133/44 (!) 149/47  Pulse: 77 95  Resp: 16 16  Temp: 98.7 F (37.1 C) 99.4 F (37.4 C)   Vitals:   07/20/16 0519 07/20/16 1300 07/20/16 2030 07/21/16 0658  BP: (!) 127/48 (!) 124/46 (!) 133/44 (!) 149/47  Pulse: 78 87 77 95  Resp: 16 14 16 16   Temp: 98.2 F (36.8 C) 97.6 F (36.4 C) 98.7 F (37.1 C) 99.4 F (37.4  C)  TempSrc: Oral Oral Axillary Axillary  SpO2: 100% 100% 100% 100%  Weight:      Height:        General: Patient obtundent  Cardiovascular: RRR, S1/S2 + Respiratory: Air entry decreased  Abdominal: Soft, NT, ND, bowel sounds + Extremities: trace LE edema, no cyanosis   The results of significant diagnostics from this hospitalization (including imaging, microbiology, ancillary and laboratory) are listed below for reference.      Microbiology: Recent Results (from the past 240 hour(s))  Culture, Urine     Status: Abnormal   Collection Time: 07/18/16  1:23 AM  Result Value Ref Range Status   Specimen Description URINE, CATHETERIZED  Final   Special Requests NONE  Final   Culture >=100,000 COLONIES/mL ENTEROCOCCUS FAECALIS (A)  Final   Report Status 07/20/2016 FINAL  Final   Organism ID, Bacteria ENTEROCOCCUS FAECALIS (A)  Final      Susceptibility   Enterococcus faecalis - MIC*    AMPICILLIN <=2 SENSITIVE Sensitive     LEVOFLOXACIN 0.5 SENSITIVE Sensitive     NITROFURANTOIN <=16 SENSITIVE Sensitive     VANCOMYCIN 1 SENSITIVE Sensitive     * >=100,000 COLONIES/mL ENTEROCOCCUS FAECALIS     Labs: BNP (last 3 results) No results for input(s): BNP in the last 8760 hours. Basic Metabolic Panel:  Recent Labs Lab 07/18/16 0045 07/18/16 0503 07/19/16 0839 07/20/16 0531 07/21/16 0529  NA 139 139 142 145 147*  K 2.1* 2.5* 4.0 2.9* 2.7*  CL 114* 113* 121* 126* 126*  CO2 18* 17* 15* 12* 14*  GLUCOSE 166* 143* 180* 165* 122*  BUN 20 20 19 20  27*  CREATININE 1.32* 1.24* 1.17* 1.23* 1.48*  CALCIUM 9.5 9.6 11.1* 11.6* 11.4*  MG 1.9 1.9  --   --  2.0   Liver Function Tests:  Recent Labs Lab 07/18/16 0009 07/18/16 0045 07/18/16 0503  AST 23 23 23   ALT 14 11* 12*  ALKPHOS 433* 478* 460*  BILITOT 0.6 0.7 0.6  PROT 5.7* 6.1* 6.1*  ALBUMIN 2.2* 2.5* 2.4*   No results for input(s): LIPASE, AMYLASE in the last 168 hours. No results for input(s): AMMONIA in the last 168 hours. CBC:  Recent Labs Lab 07/18/16 0009 07/18/16 0021 07/18/16 0503  WBC 13.3*  --  15.8*  NEUTROABS 9.8*  --  11.7*  HGB 9.5* 10.5* 10.2*  HCT 29.9* 31.0* 32.3*  MCV 82.8  --  82.6  PLT 379  --  396   Cardiac Enzymes: No results for input(s): CKTOTAL, CKMB, CKMBINDEX, TROPONINI in the last 168 hours. BNP: Invalid input(s): POCBNP CBG:  Recent Labs Lab 07/20/16 1204 07/20/16 1746 07/20/16 2146 07/21/16 0750  07/21/16 1206  GLUCAP 154* 142* 108* 107* 111*   D-Dimer No results for input(s): DDIMER in the last 72 hours. Hgb A1c No results for input(s): HGBA1C in the last 72 hours. Lipid Profile No results for input(s): CHOL, HDL, LDLCALC, TRIG, CHOLHDL, LDLDIRECT in the last 72 hours. Thyroid function studies No results for input(s): TSH, T4TOTAL, T3FREE, THYROIDAB in the last 72 hours.  Invalid input(s): FREET3 Anemia work up No results for input(s): VITAMINB12, FOLATE, FERRITIN, TIBC, IRON, RETICCTPCT in the last 72 hours. Urinalysis    Component Value Date/Time   COLORURINE YELLOW 07/18/2016 0123   APPEARANCEUR HAZY (A) 07/18/2016 0123   LABSPEC 1.013 07/18/2016 0123   PHURINE 5.0 07/18/2016 0123   GLUCOSEU NEGATIVE 07/18/2016 0123   HGBUR NEGATIVE 07/18/2016 0123   BILIRUBINUR NEGATIVE 07/18/2016 0123  BILIRUBINUR Neg 12/04/2012 1644   KETONESUR NEGATIVE 07/18/2016 0123   PROTEINUR 30 (A) 07/18/2016 0123   UROBILINOGEN 0.2 12/18/2012 2208   NITRITE NEGATIVE 07/18/2016 0123   LEUKOCYTESUR SMALL (A) 07/18/2016 0123   Sepsis Labs Invalid input(s): PROCALCITONIN,  WBC,  LACTICIDVEN Microbiology Recent Results (from the past 240 hour(s))  Culture, Urine     Status: Abnormal   Collection Time: 07/18/16  1:23 AM  Result Value Ref Range Status   Specimen Description URINE, CATHETERIZED  Final   Special Requests NONE  Final   Culture >=100,000 COLONIES/mL ENTEROCOCCUS FAECALIS (A)  Final   Report Status 07/20/2016 FINAL  Final   Organism ID, Bacteria ENTEROCOCCUS FAECALIS (A)  Final      Susceptibility   Enterococcus faecalis - MIC*    AMPICILLIN <=2 SENSITIVE Sensitive     LEVOFLOXACIN 0.5 SENSITIVE Sensitive     NITROFURANTOIN <=16 SENSITIVE Sensitive     VANCOMYCIN 1 SENSITIVE Sensitive     * >=100,000 COLONIES/mL ENTEROCOCCUS FAECALIS     Time coordinating discharge: 40 minutes  SIGNED:  Chipper Oman, MD  [facility] 07/21/2016, 2:31 PM  Pager please text  page via  [email or web address] Password [email or web address]

## 2016-07-21 NOTE — Progress Notes (Signed)
CSW following to assist with DC plan. CSW has had multiple conversations with various family members regarding plan for transfer to [facility] today. Daughter [name] [name] expresses she and family had not agreed to residential hospice care for pt. After multiple phone calls between family members and hospice representatives, received confirmation from both hospice and daughter [name] [name] that family has decided to move forward with pt transfer to [facility] at discharge.  Spoke with MD as well and confirmed pt appropriate for transfer today. Spoke with daughter at length re: daughter stating she has requested for pt to stay another night in hospital and that pt's bed at hospice be held for tomorrow. Explained hospice home unable to hold beds due to need. Daughter had inquiries re pt's pain management at [facility]. CSW advised to speak with [facility] re the treatment pt will obtain there. Daughter expressed frustration and disappointment with course of pt's hospitalization. CSW provided support for her concerns apologizing for any miscommunication between hospice, hospital staff, and family.  Daughter again confirmed to move forward with [facility] transfer.   Pt will be transported via EMS- CSW completed medical necessity form and called for transportation.  RN report [id number].  Plan: DC to [facility] today.   Sharren Bridge, MSW, LCSW Clinical Social Work 07/21/2016 [phone]

## 2016-07-21 NOTE — Progress Notes (Signed)
CRITICAL VALUE ALERT  Critical Value:   K 2.7  Date & Time Notied: 5/30;0700  Provider Notified: Yes  Orders Received/Actions taken:

## 2016-07-22 DIAGNOSIS — R609 Edema, unspecified: Secondary | ICD-10-CM | POA: Diagnosis not present

## 2016-07-22 DIAGNOSIS — E119 Type 2 diabetes mellitus without complications: Secondary | ICD-10-CM | POA: Diagnosis not present

## 2016-07-22 DIAGNOSIS — I10 Essential (primary) hypertension: Secondary | ICD-10-CM | POA: Diagnosis not present

## 2016-07-22 DIAGNOSIS — K219 Gastro-esophageal reflux disease without esophagitis: Secondary | ICD-10-CM | POA: Diagnosis not present

## 2016-07-22 DIAGNOSIS — C189 Malignant neoplasm of colon, unspecified: Secondary | ICD-10-CM | POA: Diagnosis not present

## 2016-07-22 DIAGNOSIS — I2510 Atherosclerotic heart disease of native coronary artery without angina pectoris: Secondary | ICD-10-CM | POA: Diagnosis not present

## 2016-07-22 DIAGNOSIS — C787 Secondary malignant neoplasm of liver and intrahepatic bile duct: Secondary | ICD-10-CM | POA: Diagnosis not present

## 2016-07-23 DIAGNOSIS — E119 Type 2 diabetes mellitus without complications: Secondary | ICD-10-CM | POA: Diagnosis not present

## 2016-07-23 DIAGNOSIS — I2510 Atherosclerotic heart disease of native coronary artery without angina pectoris: Secondary | ICD-10-CM | POA: Diagnosis not present

## 2016-07-23 DIAGNOSIS — C189 Malignant neoplasm of colon, unspecified: Secondary | ICD-10-CM | POA: Diagnosis not present

## 2016-07-23 DIAGNOSIS — R609 Edema, unspecified: Secondary | ICD-10-CM | POA: Diagnosis not present

## 2016-07-23 DIAGNOSIS — C787 Secondary malignant neoplasm of liver and intrahepatic bile duct: Secondary | ICD-10-CM | POA: Diagnosis not present

## 2016-07-23 DIAGNOSIS — I10 Essential (primary) hypertension: Secondary | ICD-10-CM | POA: Diagnosis not present

## 2016-07-24 DIAGNOSIS — C787 Secondary malignant neoplasm of liver and intrahepatic bile duct: Secondary | ICD-10-CM | POA: Diagnosis not present

## 2016-07-24 DIAGNOSIS — I2510 Atherosclerotic heart disease of native coronary artery without angina pectoris: Secondary | ICD-10-CM | POA: Diagnosis not present

## 2016-07-24 DIAGNOSIS — C189 Malignant neoplasm of colon, unspecified: Secondary | ICD-10-CM | POA: Diagnosis not present

## 2016-07-24 DIAGNOSIS — I10 Essential (primary) hypertension: Secondary | ICD-10-CM | POA: Diagnosis not present

## 2016-07-24 DIAGNOSIS — R609 Edema, unspecified: Secondary | ICD-10-CM | POA: Diagnosis not present

## 2016-07-24 DIAGNOSIS — E119 Type 2 diabetes mellitus without complications: Secondary | ICD-10-CM | POA: Diagnosis not present

## 2016-07-25 ENCOUNTER — Telehealth: Payer: Self-pay

## 2016-07-25 ENCOUNTER — Telehealth: Payer: Self-pay | Admitting: *Deleted

## 2016-07-25 NOTE — Telephone Encounter (Signed)
Thank you, she was a very nice person, I called the daughter to express my condolences, no answer.

## 2016-07-25 NOTE — Telephone Encounter (Signed)
FMLA paperwork completed, faxed to [employer] at [phone]. Forms sent for scanning. Received fax confirmation.

## 2016-07-25 NOTE — Addendum Note (Signed)
Addendum  created 07/25/16 1434 by Myrtie Soman, MD   Sign clinical note

## 2016-07-25 NOTE — Telephone Encounter (Signed)
Pt passed away- 2016-08-05 at [facility].

## 2016-07-25 NOTE — Telephone Encounter (Signed)
Received notice from [facility] that patient passed away on 2016-08-16.  Dr Marin Olp notified.

## 2016-07-25 NOTE — Anesthesia Postprocedure Evaluation (Signed)
Anesthesia Post Note  Patient: [name] [name]  Procedure(s) Performed: Procedure(s) (LRB): COLONOSCOPY WITH PROPOFOL (N/A)     Anesthesia Post Evaluation  Last Vitals:  Vitals:   06/30/16 1415 06/30/16 1420  BP:    Pulse: 66 63  Resp: 14 17  Temp:      Last Pain:  Vitals:   06/30/16 1345  TempSrc: Oral                 Zayli Villafuerte S

## 2016-08-03 ENCOUNTER — Ambulatory Visit: Payer: Medicare Other | Admitting: Internal Medicine

## 2016-08-10 NOTE — Telephone Encounter (Signed)
Closed encounter , patient is deceased

## 2016-08-11 ENCOUNTER — Ambulatory Visit (INDEPENDENT_AMBULATORY_CARE_PROVIDER_SITE_OTHER): Payer: Medicare Other | Admitting: Orthopedic Surgery

## 2016-08-16 ENCOUNTER — Ambulatory Visit: Payer: Medicare Other | Admitting: Internal Medicine

## 2016-08-21 DEATH — deceased

## 2016-08-27 ENCOUNTER — Other Ambulatory Visit: Payer: Self-pay | Admitting: Nurse Practitioner
